# Patient Record
Sex: Male | Born: 1950
Health system: Southern US, Community
[De-identification: ages and names within clinical notes are randomized; demographics above are authoritative.]

## PROBLEM LIST (undated history)

## (undated) DIAGNOSIS — Z7901 Long term (current) use of anticoagulants: Secondary | ICD-10-CM

## (undated) DIAGNOSIS — R001 Bradycardia, unspecified: Secondary | ICD-10-CM

## (undated) DIAGNOSIS — M1711 Unilateral primary osteoarthritis, right knee: Secondary | ICD-10-CM

## (undated) DIAGNOSIS — I10 Essential (primary) hypertension: Secondary | ICD-10-CM

## (undated) DIAGNOSIS — E7800 Pure hypercholesterolemia, unspecified: Secondary | ICD-10-CM

## (undated) DIAGNOSIS — T753XXA Motion sickness, initial encounter: Secondary | ICD-10-CM

## (undated) DIAGNOSIS — M2140 Flat foot [pes planus] (acquired), unspecified foot: Secondary | ICD-10-CM

## (undated) DIAGNOSIS — E119 Type 2 diabetes mellitus without complications: Secondary | ICD-10-CM

## (undated) DIAGNOSIS — M109 Gout, unspecified: Secondary | ICD-10-CM

## (undated) DIAGNOSIS — N400 Enlarged prostate without lower urinary tract symptoms: Secondary | ICD-10-CM

## (undated) DIAGNOSIS — Z8249 Family history of ischemic heart disease and other diseases of the circulatory system: Secondary | ICD-10-CM

## (undated) DIAGNOSIS — I2510 Atherosclerotic heart disease of native coronary artery without angina pectoris: Secondary | ICD-10-CM

## (undated) DIAGNOSIS — I5189 Other ill-defined heart diseases: Secondary | ICD-10-CM

## (undated) DIAGNOSIS — R011 Cardiac murmur, unspecified: Secondary | ICD-10-CM

## (undated) DIAGNOSIS — Z9841 Cataract extraction status, right eye: Secondary | ICD-10-CM

## (undated) DIAGNOSIS — M1710 Unilateral primary osteoarthritis, unspecified knee: Secondary | ICD-10-CM

## (undated) DIAGNOSIS — G4733 Obstructive sleep apnea (adult) (pediatric): Secondary | ICD-10-CM

## (undated) DIAGNOSIS — M159 Polyosteoarthritis, unspecified: Secondary | ICD-10-CM

## (undated) DIAGNOSIS — L609 Nail disorder, unspecified: Secondary | ICD-10-CM

## (undated) DIAGNOSIS — K219 Gastro-esophageal reflux disease without esophagitis: Secondary | ICD-10-CM

## (undated) DIAGNOSIS — L989 Disorder of the skin and subcutaneous tissue, unspecified: Secondary | ICD-10-CM

## (undated) DIAGNOSIS — I739 Peripheral vascular disease, unspecified: Secondary | ICD-10-CM

## (undated) DIAGNOSIS — M1990 Unspecified osteoarthritis, unspecified site: Secondary | ICD-10-CM

## (undated) DIAGNOSIS — Z125 Encounter for screening for malignant neoplasm of prostate: Secondary | ICD-10-CM

## (undated) DIAGNOSIS — H9190 Unspecified hearing loss, unspecified ear: Secondary | ICD-10-CM

## (undated) DIAGNOSIS — K819 Cholecystitis, unspecified: Secondary | ICD-10-CM

## (undated) DIAGNOSIS — Z7982 Long term (current) use of aspirin: Secondary | ICD-10-CM

## (undated) DIAGNOSIS — K4020 Bilateral inguinal hernia, without obstruction or gangrene, not specified as recurrent: Secondary | ICD-10-CM

## (undated) DIAGNOSIS — E785 Hyperlipidemia, unspecified: Secondary | ICD-10-CM

## (undated) HISTORY — DX: Family history of ischemic heart disease and other diseases of the circulatory system: Z82.49

## (undated) HISTORY — DX: Flat foot (pes planus) (acquired), unspecified foot: M21.40

## (undated) HISTORY — PX: KNEE DEBRIDEMENT: SHX1894

## (undated) HISTORY — DX: Cholecystitis, unspecified: K81.9

## (undated) HISTORY — DX: Encounter for screening for malignant neoplasm of prostate: Z12.5

## (undated) HISTORY — DX: Essential (primary) hypertension: I10

## (undated) HISTORY — DX: Gout, unspecified: M10.9

## (undated) HISTORY — DX: Pure hypercholesterolemia, unspecified: E78.00

## (undated) HISTORY — PX: CARDIAC CATHETERIZATION: SHX172

## (undated) HISTORY — DX: Unilateral primary osteoarthritis, unspecified knee: M17.10

## (undated) HISTORY — DX: Gastro-esophageal reflux disease without esophagitis: K21.9

## (undated) HISTORY — DX: Disorder of the skin and subcutaneous tissue, unspecified: L98.9

## (undated) HISTORY — DX: Polyosteoarthritis, unspecified: M15.9

## (undated) HISTORY — DX: Nail disorder, unspecified: L60.9

## (undated) HISTORY — DX: Type 2 diabetes mellitus without complications: E11.9

## (undated) HISTORY — DX: Unilateral primary osteoarthritis, right knee: M17.11

## (undated) HISTORY — PX: BACK SURGERY: SHX140

## (undated) HISTORY — PX: TRANSURETHRAL RESECTION OF PROSTATE: SHX73

---

## 1970-11-30 HISTORY — PX: TONSILLECTOMY: SUR1361

## 1985-08-07 HISTORY — PX: LEFT HEART CATH AND CORONARY ANGIOGRAPHY: CATH118249

## 2002-12-13 DIAGNOSIS — E119 Type 2 diabetes mellitus without complications: Secondary | ICD-10-CM | POA: Insufficient documentation

## 2002-12-13 DIAGNOSIS — E7800 Pure hypercholesterolemia, unspecified: Secondary | ICD-10-CM | POA: Insufficient documentation

## 2002-12-13 DIAGNOSIS — E785 Hyperlipidemia, unspecified: Secondary | ICD-10-CM | POA: Insufficient documentation

## 2002-12-13 HISTORY — DX: Type 2 diabetes mellitus without complications: E11.9

## 2002-12-13 HISTORY — DX: Pure hypercholesterolemia, unspecified: E78.00

## 2009-11-30 HISTORY — PX: HERNIA REPAIR: SHX51

## 2010-10-17 DIAGNOSIS — I10 Essential (primary) hypertension: Secondary | ICD-10-CM | POA: Insufficient documentation

## 2010-10-17 HISTORY — DX: Essential (primary) hypertension: I10

## 2011-07-16 DIAGNOSIS — L609 Nail disorder, unspecified: Secondary | ICD-10-CM

## 2011-07-16 DIAGNOSIS — L03039 Cellulitis of unspecified toe: Secondary | ICD-10-CM | POA: Insufficient documentation

## 2011-07-16 HISTORY — DX: Nail disorder, unspecified: L60.9

## 2011-09-30 DIAGNOSIS — M2550 Pain in unspecified joint: Secondary | ICD-10-CM | POA: Insufficient documentation

## 2013-08-11 DIAGNOSIS — K219 Gastro-esophageal reflux disease without esophagitis: Secondary | ICD-10-CM

## 2013-08-11 DIAGNOSIS — M159 Polyosteoarthritis, unspecified: Secondary | ICD-10-CM

## 2013-08-11 HISTORY — DX: Polyosteoarthritis, unspecified: M15.9

## 2013-08-11 HISTORY — DX: Gastro-esophageal reflux disease without esophagitis: K21.9

## 2014-03-26 DIAGNOSIS — M109 Gout, unspecified: Secondary | ICD-10-CM

## 2014-03-26 DIAGNOSIS — Z8249 Family history of ischemic heart disease and other diseases of the circulatory system: Secondary | ICD-10-CM | POA: Insufficient documentation

## 2014-03-26 HISTORY — DX: Gout, unspecified: M10.9

## 2014-03-26 HISTORY — DX: Family history of ischemic heart disease and other diseases of the circulatory system: Z82.49

## 2014-10-29 DIAGNOSIS — Z125 Encounter for screening for malignant neoplasm of prostate: Secondary | ICD-10-CM

## 2014-10-29 DIAGNOSIS — R202 Paresthesia of skin: Secondary | ICD-10-CM | POA: Insufficient documentation

## 2014-10-29 HISTORY — DX: Encounter for screening for malignant neoplasm of prostate: Z12.5

## 2015-02-15 ENCOUNTER — Emergency Department: Payer: Self-pay | Admitting: Emergency Medicine

## 2015-03-11 DIAGNOSIS — L989 Disorder of the skin and subcutaneous tissue, unspecified: Secondary | ICD-10-CM

## 2015-03-11 HISTORY — DX: Disorder of the skin and subcutaneous tissue, unspecified: L98.9

## 2015-03-27 ENCOUNTER — Other Ambulatory Visit: Payer: Self-pay | Admitting: Orthopedic Surgery

## 2015-04-05 ENCOUNTER — Encounter (HOSPITAL_COMMUNITY)
Admission: RE | Admit: 2015-04-05 | Discharge: 2015-04-05 | Disposition: A | Discharge status: Home / Self Care | Payer: BC Managed Care – PPO | Source: Ambulatory Visit | Attending: Orthopedic Surgery | Admitting: Orthopedic Surgery

## 2015-04-05 ENCOUNTER — Encounter (HOSPITAL_COMMUNITY): Payer: Self-pay

## 2015-04-05 DIAGNOSIS — Z01810 Encounter for preprocedural cardiovascular examination: Secondary | ICD-10-CM | POA: Insufficient documentation

## 2015-04-05 DIAGNOSIS — E119 Type 2 diabetes mellitus without complications: Secondary | ICD-10-CM | POA: Insufficient documentation

## 2015-04-05 DIAGNOSIS — I10 Essential (primary) hypertension: Secondary | ICD-10-CM | POA: Diagnosis not present

## 2015-04-05 DIAGNOSIS — Z01812 Encounter for preprocedural laboratory examination: Secondary | ICD-10-CM | POA: Diagnosis not present

## 2015-04-05 DIAGNOSIS — Z01818 Encounter for other preprocedural examination: Secondary | ICD-10-CM | POA: Insufficient documentation

## 2015-04-05 HISTORY — DX: Essential (primary) hypertension: I10

## 2015-04-05 HISTORY — DX: Type 2 diabetes mellitus without complications: E11.9

## 2015-04-05 HISTORY — DX: Unspecified osteoarthritis, unspecified site: M19.90

## 2015-04-05 HISTORY — DX: Gastro-esophageal reflux disease without esophagitis: K21.9

## 2015-04-05 LAB — BASIC METABOLIC PANEL
Anion gap: 10 (ref 5–15)
BUN: 14 mg/dL (ref 6–20)
CO2: 25 mmol/L (ref 22–32)
Calcium: 9.4 mg/dL (ref 8.9–10.3)
Chloride: 104 mmol/L (ref 101–111)
Creatinine, Ser: 1.01 mg/dL (ref 0.61–1.24)
Glucose, Bld: 116 mg/dL — ABNORMAL HIGH (ref 70–99)
Potassium: 4.2 mmol/L (ref 3.5–5.1)
Sodium: 139 mmol/L (ref 135–145)

## 2015-04-05 LAB — TYPE AND SCREEN
ABO/RH(D): A POS
ANTIBODY SCREEN: NEGATIVE

## 2015-04-05 LAB — CBC WITH DIFFERENTIAL/PLATELET
BASOS ABS: 0.1 10*3/uL (ref 0.0–0.1)
BASOS PCT: 1 % (ref 0–1)
EOS PCT: 3 % (ref 0–5)
Eosinophils Absolute: 0.2 10*3/uL (ref 0.0–0.7)
HCT: 44 % (ref 39.0–52.0)
Hemoglobin: 14.8 g/dL (ref 13.0–17.0)
Lymphocytes Relative: 25 % (ref 12–46)
Lymphs Abs: 1.5 10*3/uL (ref 0.7–4.0)
MCH: 28.2 pg (ref 26.0–34.0)
MCHC: 33.6 g/dL (ref 30.0–36.0)
MCV: 84 fL (ref 78.0–100.0)
MONO ABS: 0.6 10*3/uL (ref 0.1–1.0)
Monocytes Relative: 11 % (ref 3–12)
Neutro Abs: 3.5 10*3/uL (ref 1.7–7.7)
Neutrophils Relative %: 60 % (ref 43–77)
PLATELETS: 220 10*3/uL (ref 150–400)
RBC: 5.24 MIL/uL (ref 4.22–5.81)
RDW: 13.1 % (ref 11.5–15.5)
WBC: 5.8 10*3/uL (ref 4.0–10.5)

## 2015-04-05 LAB — URINALYSIS, ROUTINE W REFLEX MICROSCOPIC
BILIRUBIN URINE: NEGATIVE
Glucose, UA: NEGATIVE mg/dL
HGB URINE DIPSTICK: NEGATIVE
Ketones, ur: NEGATIVE mg/dL
Leukocytes, UA: NEGATIVE
Nitrite: NEGATIVE
Protein, ur: NEGATIVE mg/dL
SPECIFIC GRAVITY, URINE: 1.011 (ref 1.005–1.030)
UROBILINOGEN UA: 0.2 mg/dL (ref 0.0–1.0)
pH: 5 (ref 5.0–8.0)

## 2015-04-05 LAB — SURGICAL PCR SCREEN
MRSA, PCR: NEGATIVE
Staphylococcus aureus: NEGATIVE

## 2015-04-05 LAB — PROTIME-INR
INR: 0.96 (ref 0.00–1.49)
PROTHROMBIN TIME: 12.8 s (ref 11.6–15.2)

## 2015-04-05 LAB — APTT: APTT: 37 s (ref 24–37)

## 2015-04-05 LAB — ABO/RH: ABO/RH(D): A POS

## 2015-04-05 NOTE — Pre-Procedure Instructions (Signed)
Marylee FlorasClyde D Harville  04/05/2015   Your procedure is scheduled on:  04/15/15  [facility] at 1030 AM.  Call this number if you have problems the morning of surgery: [phone]   Remember:   Do not eat food or drink liquids after midnight.   Take these medicines the morning of surgery with A SIP OF WATER: clonidine,labelatol, prilosec, uloric     STOP all herbel meds, nsaids (aleve,naproxen,advil,ibuprofen) 5 days prior to surgery starting 04/10/15 aspirin, krill oil, vitamins,   NO metformin day of surgery   Do not wear jewelry, make-up or nail polish.  Do not wear lotions, powders, or perfumes. You may wear deodorant.  Do not shave 48 hours prior to surgery. Men may shave face and neck.  Do not bring valuables to the hospital.  [facility] is not responsible                  for any belongings or valuables.               Contacts, dentures or bridgework may not be worn into surgery.  Leave suitcase in the car. After surgery it may be brought to your room.  For patients admitted to the hospital, discharge time is determined by your                treatment team.               Patients discharged the day of surgery will not be allowed to drive  home.  Name and phone number of your driver:   Special Instructions:  Special Instructions: [facility] - Preparing for Surgery  Before surgery, you can play an important role.  Because skin is not sterile, your skin needs to be as free of germs as possible.  You can reduce the number of germs on you skin by washing with CHG (chlorahexidine gluconate) soap before surgery.  CHG is an antiseptic cleaner which kills germs and bonds with the skin to continue killing germs even after washing.  Please DO NOT use if you have an allergy to CHG or antibacterial soaps.  If your skin becomes reddened/irritated stop using the CHG and inform your nurse when you arrive at Short Stay.  Do not shave (including legs and underarms) for at  least 48 hours prior to the first CHG shower.  You may shave your face.  Please follow these instructions carefully:   1.  Shower with CHG Soap the night before surgery and the morning of Surgery.  2.  If you choose to wash your hair, wash your hair first as usual with your normal shampoo.  3.  After you shampoo, rinse your hair and body thoroughly to remove the Shampoo.  4.  Use CHG as you would any other liquid soap.  You can apply chg directly  to the skin and wash gently with scrungie or a clean washcloth.  5.  Apply the CHG Soap to your body ONLY FROM THE NECK DOWN.  Do not use on open wounds or open sores.  Avoid contact with your eyes ears, mouth and genitals (private parts).  Wash genitals (private parts)       with your normal soap.  6.  Wash thoroughly, paying special attention to the area where your surgery will be performed.  7.  Thoroughly rinse your body with warm water from the neck down.  8.  DO NOT shower/wash with your normal soap after  using and rinsing off the CHG Soap.  9.  Pat yourself dry with a clean towel.            10.  Wear clean pajamas.            11.  Place clean sheets on your bed the night of your first shower and do not sleep with pets.  Day of Surgery  Do not apply any lotions/deodorants the morning of surgery.  Please wear clean clothes to the hospital/surgery center.   Please read over the following fact sheets that you were given: Pain Booklet, Coughing and Deep Breathing, Blood Transfusion Information, Total Joint Packet, MRSA Information and Surgical Site Infection Prevention

## 2015-04-05 NOTE — Progress Notes (Signed)
   04/05/15 0920  OBSTRUCTIVE SLEEP APNEA  Have you ever been diagnosed with sleep apnea through a sleep study? No  Do you snore loudly (loud enough to be heard through closed doors)?  0  Do you often feel tired, fatigued, or sleepy during the daytime? 0  Has anyone observed you stop breathing during your sleep? 0  Do you have, or are you being treated for high blood pressure? 1  BMI more than 35 kg/m2? 1  Age over 64 years old? 1  Neck circumference greater than 40 cm/16 inches? 1 (19)  Gender: 1  Obstructive Sleep Apnea Score 5

## 2015-04-08 NOTE — Progress Notes (Addendum)
Anesthesia Chart Review:  Pt is 64 year old male scheduled for R total knee arthroplasty on 04/15/2015 with Dr. Turner Danielsowan.   PCP is [facility]James [facility], last office visit 03/11/2015.   PMH includes: HTN, DM, GERD. Former smoker. BMI 36.5.   Medications include: ASA, clonidine, labetalol, lisinopril, metformin.   Preoperative labs reviewed.    Chest x-ray reviewed. No edema or consolidation.   EKG: Sinus rhythm with 1st degree A-V block  Pt's last office visit note with PCP 03/11/2015 indicates PCP wanted to see pt for pre-op evaluation exam. Notified Olegario MessierKathy in Dr. Wadie Lessenowan's office.   If no changes, I anticipate pt can proceed with surgery as scheduled.   Rica Mastngela Kabbe, FNP-BC [facility]/Anesthesiology Phone: [phone] 04/08/2015 4:02 PM  Addendum: I received a phone call from Tacey RuizEmily Dolleschel, NP with [facility] yesterday.  Patient had inquired if he would need to be seen further prior to surgery. She was wanting to confirm patient's EKG, CXR, pre-operative labs results were unremarkable.  I verbally reviewed results and later faxed them to her with confirmation.  I informed her that results here were felt acceptable for the OR.   Velna Ochsllison Trevyn Lumpkin, PA-C [facility]/Anesthesiology Phone [phone] 04/12/2015 6:40 PM

## 2015-04-12 NOTE — H&P (Signed)
TOTAL KNEE ADMISSION H&P  Patient is being admitted for right total knee arthroplasty.  Subjective:  Chief Complaint:right knee pain.  HPI: Juan Watson, 64 y.o. male, has a history of pain and functional disability in the right knee due to arthritis and has failed non-surgical conservative treatments for greater than 12 weeks to includeNSAID's and/or analgesics, corticosteriod injections, viscosupplementation injections, use of assistive devices, weight reduction as appropriate and activity modification.  Onset of symptoms was gradual, starting 8 years ago with gradually worsening course since that time. The patient noted no past surgery on the right knee(s).  Patient currently rates pain in the right knee(s) at 10 out of 10 with activity. Patient has night pain, worsening of pain with activity and weight bearing, pain that interferes with activities of daily living, pain with passive range of motion and joint swelling.  Patient has evidence of joint space narrowing by imaging studies. There is no active infection.  There are no active problems to display for this patient.  Past Medical History  Diagnosis Date  . Hypertension   . Diabetes mellitus without complication   . GERD (gastroesophageal reflux disease)   . Arthritis     Past Surgical History  Procedure Laterality Date  . Tonsillectomy  72  . Back surgery  77,87  . Transurethral resection of prostate  96  . Hernia repair  11,    umbilical x2  . Knee debridement Left     tendon  . Cardiac catheterization  70's    pericarditis    No prescriptions prior to admission   Allergies  Allergen Reactions  . Bee Venom Swelling    Sob, swelling  . Sulfa Antibiotics Swelling    Tongue swelling    History  Substance Use Topics  . Smoking status: Former Smoker -- 2.00 packs/day for 25 years    Types: Cigarettes    Quit date: 04/04/1992  . Smokeless tobacco: Not on file  . Alcohol Use: No    No family history on file.    Review of Systems  Constitutional: Negative.   HENT: Positive for hearing loss.   Eyes: Negative.   Respiratory: Negative.   Cardiovascular: Negative.   Gastrointestinal: Negative.   Genitourinary: Negative.   Musculoskeletal: Positive for joint pain.  Skin: Negative.   Neurological: Negative.   Endo/Heme/Allergies: Negative.   Psychiatric/Behavioral: Negative.     Objective:  Physical Exam  Constitutional: He is oriented to person, place, and time. He appears well-developed and well-nourished.  HENT:  Head: Normocephalic and atraumatic.  Eyes: Pupils are equal, round, and reactive to light.  Neck: Normal range of motion. Neck supple.  Cardiovascular: Intact distal pulses.   Respiratory: Effort normal.  Musculoskeletal: He exhibits tenderness.  the patient has a range from approximately 2-3 to 95-100.  No instability.  Patient does have mild medial joint line tenderness.  Increased pain over the superior portion of the patellofemoral joint.  Obvious crepitus with range of motion.  Patient's left knee does have a large scar from his previous quadriceps tendon repair and also a chain saw injury.  Patient does have a mild to moderate effusion.  He does have a calcific mass over the superior lateral portion of the joint capsule.  Patient's calves are soft and nontender.  He is neurovascularly intact distally.  Neurological: He is alert and oriented to person, place, and time.  Skin: Skin is warm and dry.  Psychiatric: He has a normal mood and affect. His behavior is normal.  Judgment and thought content normal.    Vital signs in last 24 hours:    Labs:   There is no height or weight on file to calculate BMI.   Imaging Review Plain radiographs demonstrate bilateral AP weightbearing, bilateral Rosenberg, lateral sunrise views of the right knee are taken and reviewed in office today.  Patient does have end-stage bone-on-bone arthritis of the medial compartment.  He has  moderate to severe arthritis of the patellofemoral compartment.  Patient does have a calcific mass over the superior lateral portion of the capsule.  Assessment/Plan:  End stage arthritis, right knee   The patient history, physical examination, clinical judgment of the provider and imaging studies are consistent with end stage degenerative joint disease of the right knee(s) and total knee arthroplasty is deemed medically necessary. The treatment options including medical management, injection therapy arthroscopy and arthroplasty were discussed at length. The risks and benefits of total knee arthroplasty were presented and reviewed. The risks due to aseptic loosening, infection, stiffness, patella tracking problems, thromboembolic complications and other imponderables were discussed. The patient acknowledged the explanation, agreed to proceed with the plan and consent was signed. Patient is being admitted for inpatient treatment for surgery, pain control, PT, OT, prophylactic antibiotics, VTE prophylaxis, progressive ambulation and ADL's and discharge planning. The patient is planning to be discharged home with home health services

## 2015-04-14 DIAGNOSIS — M1711 Unilateral primary osteoarthritis, right knee: Secondary | ICD-10-CM

## 2015-04-14 HISTORY — DX: Unilateral primary osteoarthritis, right knee: M17.11

## 2015-04-15 ENCOUNTER — Encounter (HOSPITAL_COMMUNITY): Payer: Self-pay | Admitting: Surgery

## 2015-04-15 ENCOUNTER — Inpatient Hospital Stay (HOSPITAL_COMMUNITY): Payer: BC Managed Care – PPO | Admitting: Anesthesiology

## 2015-04-15 ENCOUNTER — Inpatient Hospital Stay (HOSPITAL_COMMUNITY): Payer: BC Managed Care – PPO | Admitting: Vascular Surgery

## 2015-04-15 ENCOUNTER — Inpatient Hospital Stay (HOSPITAL_COMMUNITY)
Admission: RE | Admit: 2015-04-15 | Discharge: 2015-04-17 | DRG: 470 | Disposition: A | Discharge status: Home Health | Payer: BC Managed Care – PPO | Source: Ambulatory Visit | Attending: Orthopedic Surgery | Admitting: Orthopedic Surgery

## 2015-04-15 ENCOUNTER — Encounter (HOSPITAL_COMMUNITY)
Admission: RE | Disposition: A | Discharge status: Home Health | Payer: Self-pay | Source: Ambulatory Visit | Attending: Orthopedic Surgery

## 2015-04-15 DIAGNOSIS — M1711 Unilateral primary osteoarthritis, right knee: Secondary | ICD-10-CM | POA: Diagnosis present

## 2015-04-15 DIAGNOSIS — E119 Type 2 diabetes mellitus without complications: Secondary | ICD-10-CM | POA: Diagnosis present

## 2015-04-15 DIAGNOSIS — K219 Gastro-esophageal reflux disease without esophagitis: Secondary | ICD-10-CM | POA: Diagnosis present

## 2015-04-15 DIAGNOSIS — R110 Nausea: Secondary | ICD-10-CM | POA: Diagnosis not present

## 2015-04-15 DIAGNOSIS — D62 Acute posthemorrhagic anemia: Secondary | ICD-10-CM | POA: Diagnosis not present

## 2015-04-15 DIAGNOSIS — Z87891 Personal history of nicotine dependence: Secondary | ICD-10-CM | POA: Diagnosis not present

## 2015-04-15 DIAGNOSIS — M1710 Unilateral primary osteoarthritis, unspecified knee: Secondary | ICD-10-CM | POA: Insufficient documentation

## 2015-04-15 DIAGNOSIS — M25561 Pain in right knee: Secondary | ICD-10-CM | POA: Diagnosis present

## 2015-04-15 DIAGNOSIS — M179 Osteoarthritis of knee, unspecified: Secondary | ICD-10-CM

## 2015-04-15 DIAGNOSIS — I10 Essential (primary) hypertension: Secondary | ICD-10-CM | POA: Diagnosis present

## 2015-04-15 HISTORY — DX: Osteoarthritis of knee, unspecified: M17.9

## 2015-04-15 HISTORY — PX: TOTAL KNEE ARTHROPLASTY: SHX125

## 2015-04-15 HISTORY — DX: Unilateral primary osteoarthritis, unspecified knee: M17.10

## 2015-04-15 LAB — GLUCOSE, CAPILLARY
GLUCOSE-CAPILLARY: 129 mg/dL — AB (ref 65–99)
GLUCOSE-CAPILLARY: 107 mg/dL — AB (ref 65–99)
GLUCOSE-CAPILLARY: 102 mg/dL — AB (ref 65–99)

## 2015-04-15 SURGERY — ARTHROPLASTY, KNEE, TOTAL
Anesthesia: Spinal | Site: Knee | Laterality: Right

## 2015-04-15 MED ORDER — HYDROMORPHONE HCL 1 MG/ML IJ SOLN
1.0000 mg | INTRAMUSCULAR | Status: DC | PRN
  Administered 2015-04-15 – 2015-04-17 (×6): 1 mg via INTRAVENOUS
  Filled 2015-04-15 (×6): qty 1

## 2015-04-15 MED ORDER — METHOCARBAMOL 500 MG PO TABS: 500.0000 mg | ORAL_TABLET | Freq: Two times a day (BID) | ORAL | Status: DC

## 2015-04-15 MED ORDER — LACTATED RINGERS IV SOLN
INTRAVENOUS | Status: DC
  Administered 2015-04-15: 10:00:00 via INTRAVENOUS

## 2015-04-15 MED ORDER — OXYCODONE HCL 5 MG PO TABS: 5.0000 mg | ORAL_TABLET | Freq: Once | ORAL | Status: DC | PRN

## 2015-04-15 MED ORDER — CHLORHEXIDINE GLUCONATE 4 % EX LIQD
60.0000 mL | Freq: Once | CUTANEOUS | Status: DC
  Filled 2015-04-15: qty 60

## 2015-04-15 MED ORDER — LOSARTAN POTASSIUM 50 MG PO TABS
100.0000 mg | ORAL_TABLET | Freq: Every day | ORAL | Status: DC
  Administered 2015-04-15 – 2015-04-17 (×3): 100 mg via ORAL
  Filled 2015-04-15 (×3): qty 2

## 2015-04-15 MED ORDER — OXYCODONE-ACETAMINOPHEN 5-325 MG PO TABS: 1.0000 | ORAL_TABLET | ORAL | Status: DC | PRN

## 2015-04-15 MED ORDER — FLEET ENEMA 7-19 GM/118ML RE ENEM: 1.0000 | ENEMA | Freq: Once | RECTAL | Status: AC | PRN

## 2015-04-15 MED ORDER — TRANEXAMIC ACID 1000 MG/10ML IV SOLN
1000.0000 mg | INTRAVENOUS | Status: AC
  Administered 2015-04-15: 1000 mg via INTRAVENOUS
  Filled 2015-04-15: qty 10

## 2015-04-15 MED ORDER — BISACODYL 5 MG PO TBEC: 5.0000 mg | DELAYED_RELEASE_TABLET | Freq: Every day | ORAL | Status: DC | PRN

## 2015-04-15 MED ORDER — LACTATED RINGERS IV SOLN
INTRAVENOUS | Status: DC | PRN
  Administered 2015-04-15 (×2): via INTRAVENOUS

## 2015-04-15 MED ORDER — PROPOFOL INFUSION 10 MG/ML OPTIME
INTRAVENOUS | Status: DC | PRN
  Administered 2015-04-15: 40 ug/kg/min via INTRAVENOUS

## 2015-04-15 MED ORDER — MENTHOL 3 MG MT LOZG: 1.0000 | LOZENGE | OROMUCOSAL | Status: DC | PRN

## 2015-04-15 MED ORDER — BUPIVACAINE HCL (PF) 0.75 % IJ SOLN
INTRAMUSCULAR | Status: DC | PRN
  Administered 2015-04-15: 1.6 mL via INTRATHECAL

## 2015-04-15 MED ORDER — FENTANYL CITRATE (PF) 100 MCG/2ML IJ SOLN
INTRAMUSCULAR | Status: AC
  Filled 2015-04-15: qty 2

## 2015-04-15 MED ORDER — METOCLOPRAMIDE HCL 5 MG/ML IJ SOLN
5.0000 mg | Freq: Three times a day (TID) | INTRAMUSCULAR | Status: DC | PRN
  Administered 2015-04-16: 10 mg via INTRAVENOUS
  Filled 2015-04-15: qty 2

## 2015-04-15 MED ORDER — ACETAMINOPHEN 325 MG PO TABS: 650.0000 mg | ORAL_TABLET | Freq: Four times a day (QID) | ORAL | Status: DC | PRN

## 2015-04-15 MED ORDER — INSULIN ASPART 100 UNIT/ML ~~LOC~~ SOLN
0.0000 [IU] | Freq: Three times a day (TID) | SUBCUTANEOUS | Status: DC
  Administered 2015-04-16: 3 [IU] via SUBCUTANEOUS
  Administered 2015-04-16 – 2015-04-17 (×5): 2 [IU] via SUBCUTANEOUS

## 2015-04-15 MED ORDER — ONDANSETRON HCL 4 MG PO TABS: 4.0000 mg | ORAL_TABLET | Freq: Four times a day (QID) | ORAL | Status: DC | PRN

## 2015-04-15 MED ORDER — MIDAZOLAM HCL 5 MG/5ML IJ SOLN
INTRAMUSCULAR | Status: DC | PRN
  Administered 2015-04-15: 2 mg via INTRAVENOUS

## 2015-04-15 MED ORDER — GEMFIBROZIL 600 MG PO TABS
600.0000 mg | ORAL_TABLET | Freq: Two times a day (BID) | ORAL | Status: DC
  Administered 2015-04-15 – 2015-04-17 (×4): 600 mg via ORAL
  Filled 2015-04-15 (×5): qty 1

## 2015-04-15 MED ORDER — DOCUSATE SODIUM 100 MG PO CAPS
100.0000 mg | ORAL_CAPSULE | Freq: Two times a day (BID) | ORAL | Status: DC
  Administered 2015-04-15 – 2015-04-17 (×4): 100 mg via ORAL
  Filled 2015-04-15 (×4): qty 1

## 2015-04-15 MED ORDER — LISINOPRIL 40 MG PO TABS: 40.0000 mg | ORAL_TABLET | Freq: Every day | ORAL | Status: DC

## 2015-04-15 MED ORDER — DIPHENHYDRAMINE HCL 12.5 MG/5ML PO ELIX: 12.5000 mg | ORAL_SOLUTION | ORAL | Status: DC | PRN

## 2015-04-15 MED ORDER — BUPIVACAINE LIPOSOME 1.3 % IJ SUSP
20.0000 mL | Freq: Once | INTRAMUSCULAR | Status: DC
  Filled 2015-04-15: qty 20

## 2015-04-15 MED ORDER — BUPIVACAINE LIPOSOME 1.3 % IJ SUSP
INTRAMUSCULAR | Status: DC | PRN
  Administered 2015-04-15: 20 mL

## 2015-04-15 MED ORDER — METOCLOPRAMIDE HCL 5 MG PO TABS: 5.0000 mg | ORAL_TABLET | Freq: Three times a day (TID) | ORAL | Status: DC | PRN

## 2015-04-15 MED ORDER — PHENOL 1.4 % MT LIQD: 1.0000 | OROMUCOSAL | Status: DC | PRN

## 2015-04-15 MED ORDER — FENTANYL CITRATE (PF) 100 MCG/2ML IJ SOLN
25.0000 ug | INTRAMUSCULAR | Status: DC | PRN
  Administered 2015-04-15 (×3): 50 ug via INTRAVENOUS

## 2015-04-15 MED ORDER — FENTANYL CITRATE (PF) 100 MCG/2ML IJ SOLN
25.0000 ug | INTRAMUSCULAR | Status: DC | PRN
  Administered 2015-04-15 (×2): 50 ug via INTRAVENOUS

## 2015-04-15 MED ORDER — ALUM & MAG HYDROXIDE-SIMETH 200-200-20 MG/5ML PO SUSP: 30.0000 mL | ORAL | Status: DC | PRN

## 2015-04-15 MED ORDER — METFORMIN HCL 500 MG PO TABS
250.0000 mg | ORAL_TABLET | Freq: Two times a day (BID) | ORAL | Status: DC
  Administered 2015-04-16 – 2015-04-17 (×4): 250 mg via ORAL
  Filled 2015-04-15 (×4): qty 1

## 2015-04-15 MED ORDER — OXYCODONE HCL 5 MG PO TABS
5.0000 mg | ORAL_TABLET | ORAL | Status: DC | PRN
  Administered 2015-04-15 – 2015-04-17 (×14): 10 mg via ORAL
  Filled 2015-04-15 (×14): qty 2

## 2015-04-15 MED ORDER — ASPIRIN EC 325 MG PO TBEC: 325.0000 mg | DELAYED_RELEASE_TABLET | Freq: Two times a day (BID) | ORAL | Status: DC

## 2015-04-15 MED ORDER — CEFAZOLIN SODIUM-DEXTROSE 2-3 GM-% IV SOLR
INTRAVENOUS | Status: AC
  Filled 2015-04-15: qty 50

## 2015-04-15 MED ORDER — SODIUM CHLORIDE 0.9 % IR SOLN
Status: DC | PRN
  Administered 2015-04-15: 1000 mL

## 2015-04-15 MED ORDER — HYDROMORPHONE HCL 1 MG/ML IJ SOLN
1.0000 mg | Freq: Once | INTRAMUSCULAR | Status: AC
  Administered 2015-04-15: 1 mg via INTRAVENOUS

## 2015-04-15 MED ORDER — MIDAZOLAM HCL 2 MG/2ML IJ SOLN
1.0000 mg | Freq: Once | INTRAMUSCULAR | Status: AC
  Administered 2015-04-15: 1 mg via INTRAVENOUS

## 2015-04-15 MED ORDER — METHOCARBAMOL 500 MG PO TABS
500.0000 mg | ORAL_TABLET | Freq: Four times a day (QID) | ORAL | Status: DC | PRN
  Administered 2015-04-15 – 2015-04-17 (×8): 500 mg via ORAL
  Filled 2015-04-15 (×8): qty 1

## 2015-04-15 MED ORDER — CLONIDINE HCL 0.1 MG PO TABS
0.1000 mg | ORAL_TABLET | Freq: Every day | ORAL | Status: DC
  Administered 2015-04-16 – 2015-04-17 (×2): 0.1 mg via ORAL
  Filled 2015-04-15 (×2): qty 1

## 2015-04-15 MED ORDER — CEFAZOLIN SODIUM-DEXTROSE 2-3 GM-% IV SOLR
2.0000 g | INTRAVENOUS | Status: AC
  Administered 2015-04-15: 2 g via INTRAVENOUS

## 2015-04-15 MED ORDER — ACETAMINOPHEN 650 MG RE SUPP
650.0000 mg | Freq: Four times a day (QID) | RECTAL | Status: DC | PRN
Start: 2015-04-15 — End: 2015-04-17

## 2015-04-15 MED ORDER — PANTOPRAZOLE SODIUM 40 MG PO TBEC
40.0000 mg | DELAYED_RELEASE_TABLET | Freq: Every day | ORAL | Status: DC
Start: 2015-04-16 — End: 2015-04-17
  Administered 2015-04-16 – 2015-04-17 (×2): 40 mg via ORAL
  Filled 2015-04-15 (×3): qty 1

## 2015-04-15 MED ORDER — FENTANYL CITRATE (PF) 100 MCG/2ML IJ SOLN
INTRAMUSCULAR | Status: DC | PRN
  Administered 2015-04-15: 25 ug via INTRAVENOUS
  Administered 2015-04-15: 50 ug via INTRAVENOUS
  Administered 2015-04-15: 25 ug via INTRAVENOUS

## 2015-04-15 MED ORDER — SENNOSIDES-DOCUSATE SODIUM 8.6-50 MG PO TABS: 1.0000 | ORAL_TABLET | Freq: Every evening | ORAL | Status: DC | PRN

## 2015-04-15 MED ORDER — ONDANSETRON HCL 4 MG/2ML IJ SOLN
4.0000 mg | Freq: Four times a day (QID) | INTRAMUSCULAR | Status: DC | PRN
Start: 2015-04-15 — End: 2015-04-17
  Administered 2015-04-15 – 2015-04-16 (×2): 4 mg via INTRAVENOUS
  Filled 2015-04-15 (×2): qty 2

## 2015-04-15 MED ORDER — ASPIRIN EC 325 MG PO TBEC
325.0000 mg | DELAYED_RELEASE_TABLET | Freq: Every day | ORAL | Status: DC
  Administered 2015-04-16 – 2015-04-17 (×2): 325 mg via ORAL
  Filled 2015-04-15 (×2): qty 1

## 2015-04-15 MED ORDER — SODIUM CHLORIDE 0.9 % IJ SOLN
INTRAMUSCULAR | Status: DC | PRN
  Administered 2015-04-15: 40 mL

## 2015-04-15 MED ORDER — OXYCODONE HCL 5 MG/5ML PO SOLN: 5.0000 mg | Freq: Once | ORAL | Status: DC | PRN

## 2015-04-15 MED ORDER — METHOCARBAMOL 1000 MG/10ML IJ SOLN
500.0000 mg | Freq: Four times a day (QID) | INTRAVENOUS | Status: DC | PRN
  Filled 2015-04-15: qty 5

## 2015-04-15 MED ORDER — KCL IN DEXTROSE-NACL 20-5-0.45 MEQ/L-%-% IV SOLN
INTRAVENOUS | Status: DC
Start: 2015-04-15 — End: 2015-04-17
  Administered 2015-04-15 – 2015-04-16 (×2): via INTRAVENOUS
  Filled 2015-04-15 (×8): qty 1000

## 2015-04-15 MED ORDER — LABETALOL HCL 200 MG PO TABS
200.0000 mg | ORAL_TABLET | Freq: Two times a day (BID) | ORAL | Status: DC
  Administered 2015-04-15 – 2015-04-17 (×5): 200 mg via ORAL
  Filled 2015-04-15 (×6): qty 1

## 2015-04-15 MED ORDER — FEBUXOSTAT 40 MG PO TABS
40.0000 mg | ORAL_TABLET | Freq: Every day | ORAL | Status: DC
  Administered 2015-04-15 – 2015-04-17 (×3): 40 mg via ORAL
  Filled 2015-04-15 (×3): qty 1

## 2015-04-15 MED ORDER — ONDANSETRON HCL 4 MG/2ML IJ SOLN
4.0000 mg | Freq: Four times a day (QID) | INTRAMUSCULAR | Status: AC | PRN
  Administered 2015-04-15: 4 mg via INTRAVENOUS

## 2015-04-15 SURGICAL SUPPLY — 70 items
BANDAGE ELASTIC 6 VELCRO ST LF (GAUZE/BANDAGES/DRESSINGS) ×3 IMPLANT
BANDAGE ESMARK 6X9 LF (GAUZE/BANDAGES/DRESSINGS) ×1 IMPLANT
BLADE SAG 18X100X1.27 (BLADE) ×3 IMPLANT
BLADE SAW SGTL 13X75X1.27 (BLADE) ×3 IMPLANT
BLADE SURG ROTATE 9660 (MISCELLANEOUS) IMPLANT
BNDG ELASTIC 6X10 VLCR STRL LF (GAUZE/BANDAGES/DRESSINGS) ×3 IMPLANT
BNDG ESMARK 6X9 LF (GAUZE/BANDAGES/DRESSINGS) ×3
BOWL SMART MIX CTS (DISPOSABLE) ×3 IMPLANT
CAPT KNEE TOTAL 3 ATTUNE ×3 IMPLANT
CEMENT HV SMART SET (Cement) ×6 IMPLANT
COVER SURGICAL LIGHT HANDLE (MISCELLANEOUS) ×3 IMPLANT
CUFF TOURNIQUET SINGLE 34IN LL (TOURNIQUET CUFF) ×3 IMPLANT
CUFF TOURNIQUET SINGLE 44IN (TOURNIQUET CUFF) IMPLANT
DRAPE EXTREMITY T 121X128X90 (DRAPE) ×3 IMPLANT
DRAPE IMP U-DRAPE 54X76 (DRAPES) ×3 IMPLANT
DRAPE U-SHAPE 47X51 STRL (DRAPES) ×3 IMPLANT
DRSG PAD ABDOMINAL 8X10 ST (GAUZE/BANDAGES/DRESSINGS) ×3 IMPLANT
DURAPREP 26ML APPLICATOR (WOUND CARE) ×6 IMPLANT
ELECT REM PT RETURN 9FT ADLT (ELECTROSURGICAL) ×3
ELECTRODE REM PT RTRN 9FT ADLT (ELECTROSURGICAL) ×1 IMPLANT
EVACUATOR 1/8 PVC DRAIN (DRAIN) IMPLANT
GAUZE SPONGE 4X4 12PLY STRL (GAUZE/BANDAGES/DRESSINGS) ×6 IMPLANT
GAUZE XEROFORM 1X8 LF (GAUZE/BANDAGES/DRESSINGS) ×3 IMPLANT
GLOVE BIO SURGEON STRL SZ7.5 (GLOVE) ×6 IMPLANT
GLOVE BIO SURGEON STRL SZ8.5 (GLOVE) ×6 IMPLANT
GLOVE BIOGEL PI IND STRL 7.0 (GLOVE) ×1 IMPLANT
GLOVE BIOGEL PI IND STRL 7.5 (GLOVE) ×1 IMPLANT
GLOVE BIOGEL PI IND STRL 8 (GLOVE) ×2 IMPLANT
GLOVE BIOGEL PI IND STRL 9 (GLOVE) ×1 IMPLANT
GLOVE BIOGEL PI INDICATOR 7.0 (GLOVE) ×2
GLOVE BIOGEL PI INDICATOR 7.5 (GLOVE) ×2
GLOVE BIOGEL PI INDICATOR 8 (GLOVE) ×4
GLOVE BIOGEL PI INDICATOR 9 (GLOVE) ×2
GLOVE SS BIOGEL STRL SZ 7.5 (GLOVE) ×2 IMPLANT
GLOVE SUPERSENSE BIOGEL SZ 7.5 (GLOVE) ×4
GOWN STRL REUS W/ TWL LRG LVL3 (GOWN DISPOSABLE) ×1 IMPLANT
GOWN STRL REUS W/ TWL XL LVL3 (GOWN DISPOSABLE) ×2 IMPLANT
GOWN STRL REUS W/TWL LRG LVL3 (GOWN DISPOSABLE) ×2
GOWN STRL REUS W/TWL XL LVL3 (GOWN DISPOSABLE) ×4
HANDPIECE INTERPULSE COAX TIP (DISPOSABLE) ×2
HOOD PEEL AWAY FACE SHEILD DIS (HOOD) ×6 IMPLANT
KIT BASIN OR (CUSTOM PROCEDURE TRAY) ×3 IMPLANT
KIT ROOM TURNOVER OR (KITS) ×3 IMPLANT
MANIFOLD NEPTUNE II (INSTRUMENTS) ×3 IMPLANT
NDL SAFETY ECLIPSE 18X1.5 (NEEDLE) IMPLANT
NEEDLE 22X1 1/2 (OR ONLY) (NEEDLE) ×3 IMPLANT
NEEDLE HYPO 18GX1.5 SHARP (NEEDLE)
NEEDLE SPNL 18GX3.5 QUINCKE PK (NEEDLE) IMPLANT
NS IRRIG 1000ML POUR BTL (IV SOLUTION) ×3 IMPLANT
PACK TOTAL JOINT (CUSTOM PROCEDURE TRAY) ×3 IMPLANT
PACK UNIVERSAL I (CUSTOM PROCEDURE TRAY) ×3 IMPLANT
PAD ARMBOARD 7.5X6 YLW CONV (MISCELLANEOUS) ×6 IMPLANT
PADDING CAST COTTON 6X4 STRL (CAST SUPPLIES) ×3 IMPLANT
SET HNDPC FAN SPRY TIP SCT (DISPOSABLE) ×1 IMPLANT
SPONGE GAUZE 4X4 12PLY STER LF (GAUZE/BANDAGES/DRESSINGS) ×3 IMPLANT
SUCTION FRAZIER TIP 10 FR DISP (SUCTIONS) ×3 IMPLANT
SUT VIC AB 0 CT1 27 (SUTURE) ×2
SUT VIC AB 0 CT1 27XBRD ANBCTR (SUTURE) ×1 IMPLANT
SUT VIC AB 1 CTX 36 (SUTURE) ×2
SUT VIC AB 1 CTX36XBRD ANBCTR (SUTURE) ×1 IMPLANT
SUT VIC AB 2-0 CT1 27 (SUTURE) ×2
SUT VIC AB 2-0 CT1 TAPERPNT 27 (SUTURE) ×1 IMPLANT
SUT VIC AB 3-0 CT1 27 (SUTURE) ×2
SUT VIC AB 3-0 CT1 TAPERPNT 27 (SUTURE) ×1 IMPLANT
SUT VIC AB 3-0 FS2 27 (SUTURE) ×3 IMPLANT
SYR 30ML LL (SYRINGE) ×3 IMPLANT
SYR 50ML LL SCALE MARK (SYRINGE) ×3 IMPLANT
TOWEL OR 17X24 6PK STRL BLUE (TOWEL DISPOSABLE) ×3 IMPLANT
TOWEL OR 17X26 10 PK STRL BLUE (TOWEL DISPOSABLE) ×3 IMPLANT
WATER STERILE IRR 1000ML POUR (IV SOLUTION) ×9 IMPLANT

## 2015-04-15 NOTE — Interval H&P Note (Signed)
History and Physical Interval Note:  04/15/2015 10:03 AM  Juan Watson  has presented today for surgery, with the diagnosis of RIGHT KNEE OSTEOARTHRITIS  The various methods of treatment have been discussed with the patient and family. After consideration of risks, benefits and other options for treatment, the patient has consented to  Procedure(s): TOTAL KNEE ARTHROPLASTY (Right) as a surgical intervention .  The patient's history has been reviewed, patient examined, no change in status, stable for surgery.  I have reviewed the patient's chart and labs.  Questions were answered to the patient's satisfaction.     Nestor LewandowskyOWAN,Juan Watson

## 2015-04-15 NOTE — Progress Notes (Signed)
CBG 132. 

## 2015-04-15 NOTE — Progress Notes (Signed)
Dr Noreene LarssonJoslin at bedside administered 40mcg bolus of Precedex. Patient comfortable and resting. BP 149/67. Will continue to monitor.

## 2015-04-15 NOTE — Transfer of Care (Signed)
Immediate Anesthesia Transfer of Care Note  Patient: Juan FlorasClyde D Dockendorf  Procedure(s) Performed: Procedure(s): TOTAL KNEE ARTHROPLASTY (Right)  Patient Location: PACU  Anesthesia Type:MAC and Spinal  Level of Consciousness: awake and alert   Airway & Oxygen Therapy: Patient Spontanous Breathing  Post-op Assessment: Report given to RN and Post -op Vital signs reviewed and stable  Post vital signs: Reviewed and stable  Last Vitals:  Filed Vitals:   04/15/15 1107  BP: 196/87  Pulse:   Temp:   Resp:     Complications: No apparent anesthesia complications

## 2015-04-15 NOTE — Anesthesia Preprocedure Evaluation (Signed)
Anesthesia Evaluation  Patient identified by MRN, date of birth, ID band Patient awake    Reviewed: Allergy & Precautions, NPO status , Patient's Chart, lab work & pertinent test results  Airway Mallampati: II   Neck ROM: full    Dental   Pulmonary former smoker,  breath sounds clear to auscultation        Cardiovascular hypertension, Rhythm:regular Rate:Normal     Neuro/Psych    GI/Hepatic GERD-  ,  Endo/Other  diabetes, Type 2obese  Renal/GU      Musculoskeletal  (+) Arthritis -,   Abdominal   Peds  Hematology   Anesthesia Other Findings   Reproductive/Obstetrics                             Anesthesia Physical Anesthesia Plan  ASA: II  Anesthesia Plan: Spinal   Post-op Pain Management:    Induction: Intravenous  Airway Management Planned: Simple Face Mask  Additional Equipment:   Intra-op Plan:   Post-operative Plan:   Informed Consent: I have reviewed the patients History and Physical, chart, labs and discussed the procedure including the risks, benefits and alternatives for the proposed anesthesia with the patient or authorized representative who has indicated his/her understanding and acceptance.     Plan Discussed with: CRNA, Anesthesiologist and Surgeon  Anesthesia Plan Comments:         Anesthesia Quick Evaluation

## 2015-04-15 NOTE — Discharge Instructions (Signed)

## 2015-04-15 NOTE — Progress Notes (Signed)
Orthopedic Tech Progress Note Patient Details:  Marylee FlorasClyde D Muma 1951-10-02 [id number] CPM applied to RLE with appropriate settings. OHF applied to bed. CPM Right Knee CPM Right Knee: On Right Knee Flexion (Degrees): 60 Right Knee Extension (Degrees): 0   Asia R Thompson 04/15/2015, 3:05 PM

## 2015-04-15 NOTE — Progress Notes (Signed)
Spoke with Dr Chaney MallingHodierne regarding elevated BP, last BP reading 200/84. Pain increasing 8/10. Ordered additional 150mcg Fentanyl titrated. Will continue to monitor for decrease in BP and pain level.

## 2015-04-15 NOTE — Anesthesia Postprocedure Evaluation (Signed)
  Anesthesia Post-op Note  Patient: Juan Watson  Procedure(s) Performed: Procedure(s): TOTAL KNEE ARTHROPLASTY (Right)  Patient Location: PACU  Anesthesia Type: Spinal   Level of Consciousness: awake, alert  and oriented  Airway and Oxygen Therapy: Patient Spontanous Breathing  Post-op Pain: moderate  Post-op Assessment: Post-op Vital signs reviewed  Post-op Vital Signs: Reviewed  Last Vitals:  Filed Vitals:   04/15/15 2137  BP: 167/98  Pulse: 80  Temp: 36.5 C  Resp:     Complications: No apparent anesthesia complications

## 2015-04-15 NOTE — Op Note (Signed)
PATIENT ID:      Juan FlorasClyde D Watson  MRN:     [id number] DOB/AGE:    Mar 13, 1951 / 64 y.o.       OPERATIVE REPORT    DATE OF PROCEDURE:  04/15/2015       PREOPERATIVE DIAGNOSIS:   RIGHT KNEE OSTEOARTHRITIS      There is no weight on file to calculate BMI.                                                        POSTOPERATIVE DIAGNOSIS:   RIGHT KNEE OSTEOARTHRITIS                                                                      PROCEDURE:  Procedure(s): TOTAL KNEE ARTHROPLASTY Using DepuyAttune RP implants #7R Femur, #8Tibia, 5 mm Attune RP bearing, 41 Patella     SURGEON: Jessalyn Hinojosa J    ASSISTANT:   Eric K. [employer] PA-C   (Present and scrubbed throughout the case, critical for assistance with exposure, retraction, instrumentation, and closure.)         ANESTHESIA: Spinal, Exparel  EBL: 300  FLUID REPLACEMENT: 1800 crystalloid  TOURNIQUET TIME: 15min  Drains: None  Tranexamic Acid: 1gm IV   COMPLICATIONS:  None         INDICATIONS FOR PROCEDURE: The patient has  RIGHT KNEE OSTEOARTHRITIS, varus deformities, XR shows bone on bone arthritis. Patient has failed all conservative measures including anti-inflammatory medicines, narcotics, attempts at  exercise and weight loss, cortisone injections and viscosupplementation.  Risks and benefits of surgery have been discussed, questions answered.   DESCRIPTION OF PROCEDURE: The patient identified by armband, received  IV antibiotics, in [facility] holding [facility]. Patient taken to the operating room, appropriate anesthetic  monitors were attached, and general endotracheal anesthesia induced with  the patient in supine position. Tourniquet  applied high to the operative thigh. Lateral post and foot positioner  applied to the table, the lower extremity was then prepped and draped  in usual sterile fashion from the ankle to the tourniquet. Time-out procedure was performed. We began the operation, with the knee flexed 100 degrees, by  making the anterior midline incision starting at handbreadth above the patella going over the patella 1 cm medial to and 4 cm distal to the tibial tubercle. Small bleeders in the skin and the  subcutaneous tissue identified and cauterized. Transverse retinaculum was incised and reflected medially and a medial parapatellar arthrotomy was accomplished. the patella was everted and theprepatellar fat pad resected. The superficial medial collateral  ligament was then elevated from anterior to posterior along the proximal  flare of the tibia and anterior half of the menisci resected. The knee was hyperflexed exposing bone on bone arthritis. Peripheral and notch osteophytes as well as the cruciate ligaments were then resected. We continued to  work our way around posteriorly along the proximal tibia, and externally  rotated the tibia subluxing it out from underneath the femur. A McHale  retractor was placed through the notch and a lateral Personal assistantHohmann retractor  placed, and we then drilled through the proximal tibia in line with the  axis of the tibia followed by an intramedullary guide rod and 2-degree  posterior slope cutting guide. The tibial cutting guide, 3 degree posterior sloped, was pinned into place allowing resection of 6 mm of bone medially and about 12 mm of bone laterally. Satisfied with the tibial resection, we then  entered the distal femur 2 mm anterior to the PCL origin with the  intramedullary guide rod and applied the distal femoral cutting guide  set at 9mm, with 5 degrees of valgus. This was pinned along the  epicondylar axis. At this point, the distal femoral cut was accomplished without difficulty. We then sized for a #7R femoral component and pinned the guide in 3 degrees of external rotation.The chamfer cutting guide was pinned into place. The anterior, posterior, and chamfer cuts were accomplished without difficulty followed by  the Attune RP box cutting guide and the box cut. We also  removed posterior osteophytes from the posterior femoral condyles. At this  time, the knee was brought into full extension. We checked our  extension and flexion gaps and found them symmetric for a 6 mm bearing. Distracting in extension with a lamina spreader, the posterior horns of the menisci were removed, and Exparel, diluted to 60 cc, was injected into the capsule of the knee. The  posterior patella cut was accomplished with the 9.5 mm Attune cutting guide, sized at 41* dome, and the fixation pegs drilled.The knee  was then once again hyperflexed exposing the proximal tibia. We sized for a #8 tibial base plate, applied the smokestack and the conical reamer followed by the the Delta fin keel punch. We then hammered into place the Attune RP trial femoral component, inserted a  6 mm trial bearing, trial patellar button, and took the knee through range of motion from 0-130 degrees. No thumb pressure was required for patellar  Tracking. At this point, the limb was wrapped with an Esmarch bandage and the tourniquet inflated to 350 mmHg. All trial components were removed, mating surfaces irrigated with pulse lavage, and dried with suction and sponges. A double batch of DePuy HV cement with 1500 mg of Zinacef was mixed and applied to all bony metallic mating surfaces except for the posterior condyles of the femur itself. In order, we  hammered into place the tibial tray and removed excess cement, the femoral component and removed excess cement,  The 6  Attune RP bearing  was inserted, and the knee brought to full extension with compression.  The patellar button was clamped into place, and excess cement  removed. While the cement cured the wound was irrigated out with normal saline solution pulse lavage. Ligament stability and patellar tracking were checked and found to be excellent. The parapatellar arthrotomy was closed with  running #1 Vicryl suture. The subcutaneous tissue with 0 and 2-0 undyed  Vicryl  suture, and the skin with running 3-0 SQ vicryl. A dressing of Xeroform,  4 x 4, dressing sponges, Webril, and Ace wrap applied. The patient  awakened, extubated, and taken to recovery room without difficulty.   Neveen Daponte J 04/15/2015, 1:56 PM

## 2015-04-15 NOTE — Anesthesia Procedure Notes (Signed)
Spinal Patient location during procedure: OR Start time: 04/15/2015 12:12 PM End time: 04/15/2015 12:20 PM Staffing Anesthesiologist: Saher Davee Performed by: anesthesiologist  Preanesthetic Checklist Completed: patient identified, site marked, surgical consent, pre-op evaluation, timeout performed, IV checked, risks and benefits discussed and monitors and equipment checked Spinal Block Patient position: sitting Prep: Betadine Patient monitoring: heart rate, cardiac monitor, continuous pulse ox and blood pressure Approach: midline Location: L3-4 Injection technique: single-shot Needle Needle type: Pencan  Needle gauge: 24 G Needle length: 10 cm Assessment Sensory level: T6 Additional Notes Pt tolerated the procedure well.

## 2015-04-16 LAB — CBC
HEMATOCRIT: 36.1 % — AB (ref 39.0–52.0)
Hemoglobin: 12.3 g/dL — ABNORMAL LOW (ref 13.0–17.0)
MCH: 27.8 pg (ref 26.0–34.0)
MCHC: 34.1 g/dL (ref 30.0–36.0)
MCV: 81.7 fL (ref 78.0–100.0)
Platelets: 228 10*3/uL (ref 150–400)
RBC: 4.42 MIL/uL (ref 4.22–5.81)
RDW: 12.9 % (ref 11.5–15.5)
WBC: 11.5 10*3/uL — ABNORMAL HIGH (ref 4.0–10.5)

## 2015-04-16 LAB — HEMOGLOBIN A1C
Hgb A1c MFr Bld: 6.7 % — ABNORMAL HIGH (ref 4.8–5.6)
MEAN PLASMA GLUCOSE: 146 mg/dL

## 2015-04-16 LAB — GLUCOSE, CAPILLARY
Glucose-Capillary: 143 mg/dL — ABNORMAL HIGH (ref 65–99)
Glucose-Capillary: 132 mg/dL — ABNORMAL HIGH (ref 65–99)

## 2015-04-16 NOTE — Progress Notes (Signed)
Paged on call Dr. Janee Mornhompson about elevated BP, manually 182/98 after PO metoprolol and IV dilaudid. No orders received. Will continue to monitor.   Lowella DellHudson, Adalynn Corne G 04/15/2015

## 2015-04-16 NOTE — Progress Notes (Signed)
Patient ID: Juan Watson, male   DOB: [birth year], 64 y.o.   MRN: [id number] PATIENT ID: Juan Watson  MRN: [id number]  DOB/AGE:  [birth year] / 64 y.o.  1 Day Post-Op Procedure(s) (LRB): TOTAL KNEE ARTHROPLASTY (Right)    PROGRESS NOTE Subjective: Patient is alert, oriented, x1 Nausea, no Vomiting, yes passing gas, no Bowel Movement. Taking PO well. Denies SOB, Chest or Calf Pain. Using Incentive Spirometer, PAS in place. Ambulate WBAT  today, CPM 0-40 Patient reports pain as 7 on 0-10 scale  .    Objective: Vital signs in last 24 hours: Filed Vitals:   04/15/15 2137 04/15/15 2200 04/16/15 0038 04/16/15 0635  BP: 167/98 182/98 179/92 163/96  Pulse: 80  81 80  Temp: 97.7 F (36.5 C)  97.9 F (36.6 C) 98.7 F (37.1 C)  TempSrc: Oral  Oral Oral  Resp:      SpO2: 97%  96% 98%      Intake/Output from previous day: I/O last 3 completed shifts: In: 1766.7 [I.V.:1766.7] Out: 175 [Urine:150; Blood:25]   Intake/Output this shift:     LABORATORY DATA:  Recent Labs  04/15/15 0942 04/15/15 1146 04/15/15 1416 04/16/15 0457  WBC  --   --   --  11.5*  HGB  --   --   --  12.3*  HCT  --   --   --  36.1*  PLT  --   --   --  228  GLUCAP 129* 107* 102*  --     Examination: Neurologically intact ABD soft Neurovascular intact Sensation intact distally Intact pulses distally Dorsiflexion/Plantar flexion intact Incision: dressing C/D/I No cellulitis present Compartment soft}  Assessment:   1 Day Post-Op Procedure(s) (LRB): TOTAL KNEE ARTHROPLASTY (Right) ADDITIONAL DIAGNOSIS: Expected Acute Blood Loss Anemia, Diabetes and Hypertension  Plan: PT/OT WBAT, CPM 5/hrs day until ROM 0-90 degrees, then D/C CPM DVT Prophylaxis:  SCDx72hrs, ASA 325 mg BID x 2 weeks DISCHARGE PLAN: Home DISCHARGE NEEDS: HHPT, CPM, Walker and 3-in-1 comode seat     Juan Watson J 04/16/2015, 7:56 AM

## 2015-04-16 NOTE — Plan of Care (Signed)
Problem: Consults Goal: Diagnosis- Total Joint Replacement Primary Total Knee Right     

## 2015-04-16 NOTE — Progress Notes (Signed)
Physical Therapy Treatment Patient Details Name: Juan FlorasClyde D Watson MRN: [id number] DOB: 1951/06/08 Today's Date: 04/16/2015    History of Present Illness Patient is a 64 y/o male s/p R TKA. PMH of HTN, DM and GERD.    PT Comments    Patient very lethargic and difficulty staying alert during treatment session limiting mobility. + nausea. Able to transfer back to bed with Mod A. Pt too lethargic to participate in further mobility or there ex- falling asleep once in the bed. Concerned about alertness and ability to participate due to pain medications. Discharge home is pending improvement in mobility next session. Will continue to follow and progress as tolerated and change discharge recommendations appropriately based on progress.   Follow Up Recommendations  Home health PT;Supervision/Assistance - 24 hour (PENDING PROGRESS.)     Equipment Recommendations  3in1 (PT)    Recommendations for Other Services OT consult     Precautions / Restrictions Precautions Precautions: Fall;Knee Precaution Booklet Issued: Yes (comment) Precaution Comments: Reviewed knee positioning, no pillow under knee and HEP. Restrictions Weight Bearing Restrictions: Yes RLE Weight Bearing: Weight bearing as tolerated    Mobility  Bed Mobility Overal bed mobility: Needs Assistance Bed Mobility: Sit to Supine     Supine to sit: Mod assist;HOB elevated Sit to supine: Min assist   General bed mobility comments: Min A to bring RLE into bed.   Transfers Overall transfer level: Needs assistance Equipment used: Rolling walker (2 wheeled) Transfers: Sit to/from Stand Sit to Stand: Mod assist Stand pivot transfers: Min assist       General transfer comment: Mod A to rise from chair x1. Cues for RLE placement and hand placement. + nausea, dizziness upon standing. Stood from Landscape architectchair x2.   Ambulation/Gait Ambulation/Gait assistance: Min assist Ambulation Distance (Feet): 3 Feet Assistive device: Rolling  walker (2 wheeled) Gait Pattern/deviations: Step-to pattern;Decreased stance time - right;Decreased step length - left;Trunk flexed;Shuffle   Gait velocity interpretation: Below normal speed for age/gender General Gait Details: Able to take a few steps towards chair with assist navigating RW and for balance. Lethargic.   Stairs            Wheelchair Mobility    Modified Rankin (Stroke Patients Only)       Balance Overall balance assessment: Needs assistance Sitting-balance support: Feet supported;Single extremity supported Sitting balance-Leahy Scale: Fair     Standing balance support: During functional activity Standing balance-Leahy Scale: Poor Standing balance comment: Relient on RW.                    Cognition Arousal/Alertness: Lethargic;Suspect due to medications Behavior During Therapy: [facility] for tasks assessed/performed Overall Cognitive Status: Within Functional Limits for tasks assessed                      Exercises Total Joint Exercises Ankle Circles/Pumps: Both;10 reps;Supine Quad Sets: Both;5 reps;Supine Heel Slides: Right;5 reps;AAROM;Seated Goniometric ROM: -20-55 degrees knee AROM    General Comments General comments (skin integrity, edema, etc.): Pt's wife present during session.      Pertinent Vitals/Pain Pain Assessment: Faces Pain Score: 9  Faces Pain Scale: Hurts little more Pain Location: right knee Pain Descriptors / Indicators: Sore Pain Intervention(s): Limited activity within patient's tolerance;Monitored during session;Repositioned;Premedicated before session    Home Living Family/patient expects to be discharged to:: Private residence Living Arrangements: Spouse/significant other Available Help at Discharge: Family;Available 24 hours/day Type of Home: House Home Access: Stairs to enter Entrance Stairs-Rails: Right  Home Layout: Able to live on main level with bedroom/bathroom;Bed/bath upstairs;Two level Home  Equipment: Crutches;Walker - 2 wheels      Prior Function Level of Independence: Independent          PT Goals (current goals can now be found in the care plan section) Acute Rehab PT Goals Patient Stated Goal: to get better PT Goal Formulation: With patient Time For Goal Achievement: 04/30/15 Potential to Achieve Goals: Fair Progress towards PT goals: Progressing toward goals    Frequency  BID    PT Plan Current plan remains appropriate    Co-evaluation             End of Session Equipment Utilized During Treatment: Gait belt Activity Tolerance: Patient limited by pain;Other (comment);Patient limited by lethargy (nausea) Patient left: in bed;with call bell/phone within reach;with bed alarm set;with family/visitor present     Time: [id number] PT Time Calculation (min) (ACUTE ONLY): 17 min  Charges:  $Therapeutic Exercise: 8-22 mins $Therapeutic Activity: 8-22 mins                    G Codes:      Juan Watson A Larrisa Cravey 04/16/2015, 1:58 PM Juan RedShauna Terren Watson, PT, DPT [phone]

## 2015-04-16 NOTE — Progress Notes (Signed)
OT Cancellation Note  Patient Details Name: Juan FlorasClyde D Mavis MRN: [id number] DOB: Jan 24, 1951   Cancelled Treatment:    Reason Eval/Treat Not Completed: Other (comment). Spoke with PT and pt having a hard time staying awake in session. Will plan to evaluate tomorrow.  Earlie RavelingStraub, Zamira Hickam L OTR/L [phone] 04/16/2015, 2:26 PM

## 2015-04-16 NOTE — Evaluation (Signed)
Physical Therapy Evaluation Patient Details Name: Juan Watson MRN: [id number] DOB: Mar 28, 1951 Today's Date: 04/16/2015   History of Present Illness  Patient is a 64 y/o male s/p R TKA. PMH of HTN, DM and GERD.  Clinical Impression  Patient presents with pain, post surgical deficits RLE s/p above surgery, lethargy and nausea impacting mobility. Pt with poor pain tolerance. Required Max A for transfers and increased time for all movement secondary to nausea and dizziness. Pt reports wanting to return home and will have 24/7 S. Pt will need to improve mobility to allow for safe d/c home. Will see in PM. Would benefit from skilled PT to improve transfers, gait, balance and mobiltiy so pt can maximize independence and return to PLOF.    Follow Up Recommendations Home health PT;Supervision/Assistance - 24 hour    Equipment Recommendations  3in1 (PT)    Recommendations for Other Services OT consult     Precautions / Restrictions Precautions Precautions: Fall;Knee Precaution Booklet Issued: Yes (comment) Precaution Comments: Reviewed knee positioning, no pillow under knee and HEP. Restrictions Weight Bearing Restrictions: Yes RLE Weight Bearing: Weight bearing as tolerated      Mobility  Bed Mobility Overal bed mobility: Needs Assistance Bed Mobility: Supine to Sit     Supine to sit: Mod assist;HOB elevated     General bed mobility comments: Mod A to bring RLE to EOB and elevate trunk. Cues for technique. + nausea, dizziness upon sitting.   Transfers Overall transfer level: Needs assistance Equipment used: Rolling walker (2 wheeled) Transfers: Sit to/from UGI CorporationStand;Stand Pivot Transfers Sit to Stand: Max assist;From elevated surface Stand pivot transfers: Min assist       General transfer comment: Max A x2 to rise from EOB on first attempt with cues for hand placement and anterior translation. SPT bed to chair with cues for technique and assist negotiating RW/advancing  RLE.  Ambulation/Gait Ambulation/Gait assistance: Min assist Ambulation Distance (Feet): 3 Feet Assistive device: Rolling walker (2 wheeled) Gait Pattern/deviations: Decreased stride length;Step-to pattern;Decreased stance time - right;Decreased step length - left;Trunk flexed;Shuffle   Gait velocity interpretation: Below normal speed for age/gender General Gait Details: Pt able to shuffle feet towards chair with assist navigating RW and for balance/support. + nausea, dizziness, diaphoretic. Vitals stable.  Stairs            Wheelchair Mobility    Modified Rankin (Stroke Patients Only)       Balance Overall balance assessment: Needs assistance Sitting-balance support: Feet supported;Single extremity supported Sitting balance-Leahy Scale: Fair     Standing balance support: During functional activity Standing balance-Leahy Scale: Poor Standing balance comment: Relient on RW.                             Pertinent Vitals/Pain Pain Assessment: 0-10 Pain Score: 9  Pain Location: right knee Pain Descriptors / Indicators: Sore;Aching;Grimacing;Guarding Pain Intervention(s): Limited activity within patient's tolerance;Monitored during session;Repositioned    Home Living Family/patient expects to be discharged to:: Private residence Living Arrangements: Spouse/significant other Available Help at Discharge: Family;Available 24 hours/day Type of Home: House Home Access: Stairs to enter Entrance Stairs-Rails: Right Entrance Stairs-Number of Steps: 2 Home Layout: Able to live on main level with bedroom/bathroom;Bed/bath upstairs;Two level Home Equipment: Crutches;Walker - 2 wheels      Prior Function Level of Independence: Independent               Hand Dominance  Extremity/Trunk Assessment   Upper Extremity Assessment: Defer to OT evaluation           Lower Extremity Assessment: RLE deficits/detail RLE Deficits / Details: Limited AROM  throughout hip, knee.       Communication   Communication: No difficulties  Cognition Arousal/Alertness: Lethargic;Suspect due to medications Behavior During Therapy: [facility] CenterWFL for tasks assessed/performed;Anxious Overall Cognitive Status: Within Functional Limits for tasks assessed                      General Comments      Exercises Total Joint Exercises Ankle Circles/Pumps: Both;10 reps;Supine Quad Sets: Both;5 reps;Supine Heel Slides: Right;5 reps;AAROM;Seated Goniometric ROM: -20-55 degrees knee AROM      Assessment/Plan    PT Assessment Patient needs continued PT services  PT Diagnosis Difficulty walking;Acute pain;Generalized weakness   PT Problem List Decreased strength;Pain;Decreased range of motion;Decreased activity tolerance;Decreased balance;Decreased mobility  PT Treatment Interventions Balance training;Gait training;Stair training;Patient/family education;Functional mobility training;Therapeutic activities;Therapeutic exercise   PT Goals (Current goals can be found in the Care Plan section) Acute Rehab PT Goals Patient Stated Goal: to get better PT Goal Formulation: With patient Time For Goal Achievement: 04/30/15 Potential to Achieve Goals: Fair    Frequency BID   Barriers to discharge        Co-evaluation               End of Session Equipment Utilized During Treatment: Gait belt Activity Tolerance: Patient limited by pain;Other (comment) (nausea.) Patient left: in chair;with call bell/phone within reach;with family/visitor present Nurse Communication: Mobility status;Need for lift equipment         Time: 1050-1140 PT Time Calculation (min) (ACUTE ONLY): 50 min   Charges:   PT Evaluation $Initial PT Evaluation Tier I: 1 Procedure PT Treatments $Therapeutic Exercise: 8-22 mins $Therapeutic Activity: 8-22 mins   PT G Codes:        Juan Watson 04/16/2015, 12:30 PM  Juan RedShauna Sho Watson, PT, DPT [phone]

## 2015-04-16 NOTE — Progress Notes (Signed)
CBG 143  

## 2015-04-17 ENCOUNTER — Encounter (HOSPITAL_COMMUNITY): Payer: Self-pay | Admitting: Orthopedic Surgery

## 2015-04-17 LAB — GLUCOSE, CAPILLARY
GLUCOSE-CAPILLARY: 132 mg/dL — AB (ref 65–99)
GLUCOSE-CAPILLARY: 166 mg/dL — AB (ref 65–99)
GLUCOSE-CAPILLARY: 150 mg/dL — AB (ref 65–99)
Glucose-Capillary: 142 mg/dL — ABNORMAL HIGH (ref 65–99)
Glucose-Capillary: 129 mg/dL — ABNORMAL HIGH (ref 65–99)
Glucose-Capillary: 140 mg/dL — ABNORMAL HIGH (ref 65–99)

## 2015-04-17 LAB — CBC
HCT: 35.4 % — ABNORMAL LOW (ref 39.0–52.0)
Hemoglobin: 12.3 g/dL — ABNORMAL LOW (ref 13.0–17.0)
MCH: 28.5 pg (ref 26.0–34.0)
MCHC: 34.7 g/dL (ref 30.0–36.0)
MCV: 81.9 fL (ref 78.0–100.0)
Platelets: 266 10*3/uL (ref 150–400)
RBC: 4.32 MIL/uL (ref 4.22–5.81)
RDW: 13 % (ref 11.5–15.5)
WBC: 12.2 10*3/uL — ABNORMAL HIGH (ref 4.0–10.5)

## 2015-04-17 MED ORDER — OXYCODONE-ACETAMINOPHEN 5-325 MG PO TABS: 1.0000 | ORAL_TABLET | Freq: Four times a day (QID) | ORAL | Status: DC | PRN

## 2015-04-17 MED ORDER — ASPIRIN EC 325 MG PO TBEC: 325.0000 mg | DELAYED_RELEASE_TABLET | Freq: Two times a day (BID) | ORAL | Status: DC

## 2015-04-17 MED ORDER — METHOCARBAMOL 500 MG PO TABS: 500.0000 mg | ORAL_TABLET | Freq: Two times a day (BID) | ORAL | Status: DC

## 2015-04-17 NOTE — Progress Notes (Signed)
Physical Therapy Treatment Patient Details Name: Juan FlorasClyde D Klingler MRN: [id number] DOB: 1951-01-20 Today's Date: 04/17/2015    History of Present Illness Patient is a 64 y/o male s/p R TKA. PMH of HTN, DM and GERD.    PT Comments    Patient progressing well with mobility. PM session focused on stair training with pt and family. Demonstrated safe stair negotiation technique to simulate home environment and recommend assist from son in law for safety. Mod A to ascend and Min A to descend. Reviewed HEP and answered all questions. Pt safe to discharge from a mobility stand point. Will continue to follow if still in hospital to maximize independence and safety.   Follow Up Recommendations  Home health PT;Supervision/Assistance - 24 hour     Equipment Recommendations  3in1 (PT)    Recommendations for Other Services       Precautions / Restrictions Precautions Precautions: Fall;Knee Precaution Booklet Issued: Yes (comment) Precaution Comments: Reviewed knee positioning, no pillow under knee and HEP. Restrictions Weight Bearing Restrictions: Yes RLE Weight Bearing: Weight bearing as tolerated    Mobility  Bed Mobility Overal bed mobility: Needs Assistance Bed Mobility: Supine to Sit     Supine to sit: Min assist Sit to supine: Min assist   General bed mobility comments: Min A to bring RLE to EOB.   Transfers Overall transfer level: Needs assistance Equipment used: Rolling walker (2 wheeled) Transfers: Sit to/from Stand Sit to Stand: Min guard         General transfer comment: Min guard for safety. Stood from AllstateEOB x1, from chair x1. Cues to keep RW with pt until getting to desired surface.  Ambulation/Gait Ambulation/Gait assistance: Min guard Ambulation Distance (Feet): 5 Feet Assistive device: Rolling walker (2 wheeled) Gait Pattern/deviations: Step-to pattern;Decreased stance time - right;Decreased step length - left;Trunk flexed;Shuffle   Gait velocity  interpretation: Below normal speed for age/gender General Gait Details: Pt with toe touch WB RLE initially progressing to heel strike with cues. Increased pain in knee.   Stairs Stairs: Yes Stairs assistance: Min assist Stair Management: One rail Right;Step to pattern;Forwards Number of Stairs: 2 General stair comments: Mod A to ascend steps with rail on RUE and therapist being rail on left side. Cues for technique. Min A to descend steps.   Wheelchair Mobility    Modified Rankin (Stroke Patients Only)       Balance Overall balance assessment: Needs assistance Sitting-balance support: Feet supported;No upper extremity supported Sitting balance-Leahy Scale: Fair     Standing balance support: During functional activity Standing balance-Leahy Scale: Poor                      Cognition Arousal/Alertness: Awake/alert Behavior During Therapy: WFL for tasks assessed/performed Overall Cognitive Status: Within Functional Limits for tasks assessed                      Exercises Total Joint Exercises Ankle Circles/Pumps: Both;10 reps;Seated Long Arc Quad: AAROM;Right;5 reps;Seated    General Comments        Pertinent Vitals/Pain Pain Assessment: Faces Faces Pain Scale: Hurts whole lot Pain Location: right knee with ROM Pain Descriptors / Indicators: Sore;Aching;Sharp Pain Intervention(s): Limited activity within patient's tolerance;Monitored during session;Patient requesting pain meds-RN notified;Repositioned    Home Living                      Prior Function  PT Goals (current goals can now be found in the care plan section) Progress towards PT goals: Progressing toward goals    Frequency  BID    PT Plan Current plan remains appropriate    Co-evaluation             End of Session Equipment Utilized During Treatment: Gait belt Activity Tolerance: Patient tolerated treatment well Patient left: in chair;with call  bell/phone within reach;with family/visitor present (RN made aware of pt ready to d/c.)     Time: 1410-1436 PT Time Calculation (min) (ACUTE ONLY): 26 min  Charges:  $Gait Training: 8-22 mins $Self Care/Home Management: 8-22                    G Codes:      Maryann Mccall A Shakisha Abend 04/17/2015, 3:12 PM  Mylo RedShauna Hedy Garro, PT, DPT [phone]

## 2015-04-17 NOTE — Progress Notes (Signed)
Physical Therapy Treatment Patient Details Name: Juan Watson MRN: [id number] DOB: [birth year] Today's Date: 04/17/2015    History of Present Illness Patient is a 64 y/o male s/p R TKA. PMH of HTN, DM and GERD.    PT Comments    Patient progressing well towards PT goals. Improved ambulation distance today. Pain improved today resulting in better activity tolerance. Reviewed HEP. Will need to perform stair training in PM prior to d/c. Will continue to follow to maximize independence.   Follow Up Recommendations  Home health PT;Supervision/Assistance - 24 hour     Equipment Recommendations  3in1 (PT)    Recommendations for Other Services       Precautions / Restrictions Precautions Precautions: Fall;Knee Precaution Booklet Issued: Yes (comment) Precaution Comments: Reviewed knee positioning, no pillow under knee and HEP. Restrictions Weight Bearing Restrictions: Yes RLE Weight Bearing: Weight bearing as tolerated    Mobility  Bed Mobility               General bed mobility comments: Sitting EOB upon PT arrival.   Transfers Overall transfer level: Needs assistance Equipment used: Rolling walker (2 wheeled) Transfers: Sit to/from Stand Sit to Stand: Min assist         General transfer comment: Min A to rise from EOB with cues for hand placement and technique. Pt pulling up on RW and RLE is extended when standing due to pain.  Ambulation/Gait Ambulation/Gait assistance: Min guard Ambulation Distance (Feet): 120 Feet Assistive device: Rolling walker (2 wheeled) Gait Pattern/deviations: Step-through pattern;Step-to pattern;Decreased stance time - right;Decreased step length - left;Trunk flexed   Gait velocity interpretation: Below normal speed for age/gender General Gait Details: Pt with toe touch WB RLE initially progressing to heel strike with cues. Increased knee flexion throughout gait cycle.    Stairs            Wheelchair Mobility    Modified  Rankin (Stroke Patients Only)       Balance Overall balance assessment: Needs assistance Sitting-balance support: Feet supported;No upper extremity supported Sitting balance-Leahy Scale: Fair     Standing balance support: During functional activity Standing balance-Leahy Scale: Poor                      Cognition Arousal/Alertness: Awake/alert Behavior During Therapy: WFL for tasks assessed/performed Overall Cognitive Status: Within Functional Limits for tasks assessed                      Exercises Total Joint Exercises Ankle Circles/Pumps: Both;20 reps;Supine Quad Sets: Right;5 reps;Seated Heel Slides: Right;AAROM;5 reps;Seated Goniometric ROM: -13-65 degrees knee AAROM.    General Comments        Pertinent Vitals/Pain Pain Assessment: 0-10 Pain Score: 3  Pain Location: right knee with movement Pain Descriptors / Indicators: Sore;Aching Pain Intervention(s): Monitored during session;Repositioned    Home Living                      Prior Function            PT Goals (current goals can now be found in the care plan section) Progress towards PT goals: Progressing toward goals    Frequency  BID    PT Plan Current plan remains appropriate    Co-evaluation             End of Session Equipment Utilized During Treatment: Gait belt Activity Tolerance: Patient tolerated treatment well Patient left: in chair;with call bell/phone  within reach;with family/visitor present     Time: [id number] PT Time Calculation (min) (ACUTE ONLY): 32 min  Charges:  $Gait Training: 8-22 mins $Therapeutic Exercise: 8-22 mins                    G Codes:      Josslyn Ciolek A Kaitlin Alcindor 04/17/2015, 10:27 AM  Mylo RedShauna Io Dieujuste, PT, DPT [phone]

## 2015-04-17 NOTE — Progress Notes (Signed)
Pt a/o, c/o pain pt stated with severe pain he "sees [place]", PRN po pain meds given as ordered and pt educated on deep breathing exercises to help with pain management, pt oob with PT today, will need to meet all PT goals prior to d/c. VSS pt stable

## 2015-04-17 NOTE — Progress Notes (Signed)
PATIENT ID: Marylee FlorasClyde D Steichen  MRN: [id number]  DOB/AGE:  October 08, 1951 / 64 y.o.  2 Days Post-Op Procedure(s) (LRB): TOTAL KNEE ARTHROPLASTY (Right)    PROGRESS NOTE Subjective: Patient is alert, oriented, no  Nausea, no Vomiting, yes passing gas, no Bowel Movement. Taking PO with small bites today.  He did complain of nausea yesterday, which limited therapy yesterday. Denies SOB, Chest or Calf Pain. Using Incentive Spirometer, PAS in place. Ambulate WBAT, CPM 0-60 Patient reports pain as 3 on 0-10 scale  .    Objective: Vital signs in last 24 hours: Filed Vitals:   04/16/15 0635 04/16/15 1400 04/16/15 2109 04/17/15 0518  BP: 163/96 101/56 140/69 115/63  Pulse: 80 71 74 95  Temp: 98.7 F (37.1 C) 98.2 F (36.8 C) 99.1 F (37.3 C) 98.7 F (37.1 C)  TempSrc: Oral  Oral   Resp:  18 18 17   SpO2: 98% 93% 95% 98%      Intake/Output from previous day: I/O last 3 completed shifts: In: 1126.7 [P.O.:360; I.V.:766.7] Out: 850 [Urine:850]   Intake/Output this shift:     LABORATORY DATA:  Recent Labs  04/16/15 0457 04/16/15 0633 04/16/15 2148 04/17/15 0448 04/17/15 0645  WBC 11.5*  --   --  12.2*  --   HGB 12.3*  --   --  12.3*  --   HCT 36.1*  --   --  35.4*  --   PLT 228  --   --  266  --   GLUCAP  --  143* 132*  --  142*    Examination: Neurologically intact Neurovascular intact Sensation intact distally Intact pulses distally Dorsiflexion/Plantar flexion intact Incision: dressing C/D/I No cellulitis present Compartment soft}  Assessment:   2 Days Post-Op Procedure(s) (LRB): TOTAL KNEE ARTHROPLASTY (Right) ADDITIONAL DIAGNOSIS: Expected Acute Blood Loss Anemia, Diabetes and Hypertension  Plan: PT/OT WBAT, CPM 5/hrs day until ROM 0-90 degrees, then D/C CPM DVT Prophylaxis:  SCDx72hrs, ASA 325 mg BID x 2 weeks DISCHARGE PLAN: Home, when pt passes therapy goals. DISCHARGE NEEDS: HHPT, HHRN, CPM, Walker and 3-in-1 comode seat     Nyonna Hargrove R 04/17/2015, 8:08  AM

## 2015-04-17 NOTE — Evaluation (Signed)
Occupational Therapy Evaluation Patient Details Name: Juan FlorasClyde D Omdahl MRN: [id number] DOB: 1951/01/27 Today's Date: 04/17/2015    History of Present Illness Patient is a 64 y/o male s/p R TKA. PMH of HTN, DM and GERD.   Clinical Impression   PTA pt lived at home and was independent with ADLs. Pt is limited by R knee pain and decreased ROM and requires assist for LB ADLs and min A for functional mobility. Pt will benefit from acute OT to progress to Supervision level to return home with family support.     Follow Up Recommendations  No OT follow up    Equipment Recommendations  None recommended by OT    Recommendations for Other Services       Precautions / Restrictions Precautions Precautions: Fall;Knee Precaution Booklet Issued: Yes (comment) Precaution Comments: Reviewed knee positioning, no pillow under knee and HEP. Restrictions Weight Bearing Restrictions: Yes RLE Weight Bearing: Weight bearing as tolerated      Mobility Bed Mobility Overal bed mobility: Needs Assistance Bed Mobility: Sit to Supine       Sit to supine: Min assist   General bed mobility comments: Assist to return RLE to bed and use of gait belt as leg lifter.  Transfers Overall transfer level: Needs assistance Equipment used: Rolling walker (2 wheeled) Transfers: Sit to/from Stand Sit to Stand: Min assist         General transfer comment: Min A to rise and steady RW.     Balance Overall balance assessment: Needs assistance Sitting-balance support: Feet supported;No upper extremity supported Sitting balance-Leahy Scale: Fair     Standing balance support: During functional activity Standing balance-Leahy Scale: Poor                              ADL Overall ADL's : Needs assistance/impaired Eating/Feeding: Independent;Sitting   Grooming: Min guard;Standing   Upper Body Bathing: Set up;Sitting   Lower Body Bathing: Minimal assistance;Sit to/from stand   Upper Body  Dressing : Set up;Sitting   Lower Body Dressing: Sit to/from stand;Minimal assistance Lower Body Dressing Details (indicate cue type and reason): Assist for RLE Toilet Transfer: Min guard;RW;Ambulation   Toileting- Clothing Manipulation and Hygiene: Min guard;Sit to/from Nurse, children'sstand     Tub/Shower Transfer Details (indicate cue type and reason): educated pt/wife on shower transfer technique Functional mobility during ADLs: Min guard;Rolling walker General ADL Comments: Pt limited by pain and fatigue during OT session. Pt ambulating from bathroom when OT arrived and assisted pt to sit EOB and don shorts. Pt returned to supine in bed with assistance and educated on use of leg lifter to assist with bed mobility.      Vision Additional Comments: No change from baseline   Perception Perception Perception Tested?: No   Praxis Praxis Praxis tested?: Not tested    Pertinent Vitals/Pain Pain Assessment: Faces Pain Score: 3  Faces Pain Scale: Hurts whole lot Pain Location: R knee Pain Descriptors / Indicators: Aching;Sore Pain Intervention(s): Limited activity within patient's tolerance;Monitored during session;Repositioned;Ice applied     Hand Dominance     Extremity/Trunk Assessment Upper Extremity Assessment Upper Extremity Assessment: Overall WFL for tasks assessed   Lower Extremity Assessment Lower Extremity Assessment: Defer to PT evaluation   Cervical / Trunk Assessment Cervical / Trunk Assessment: Normal   Communication Communication Communication: No difficulties   Cognition Arousal/Alertness: Awake/alert Behavior During Therapy: WFL for tasks assessed/performed Overall Cognitive Status: Within Functional Limits for tasks assessed  Home Living Family/patient expects to be discharged to:: Private residence Living Arrangements: Spouse/significant other Available Help at Discharge: Family;Available 24 hours/day Type of Home:  House Home Access: Stairs to enter Entergy CorporationEntrance Stairs-Number of Steps: 2 Entrance Stairs-Rails: Right Home Layout: Able to live on main level with bedroom/bathroom;Bed/bath upstairs;Two level     Bathroom Shower/Tub: Producer, television/film/videoWalk-in shower   Bathroom Toilet: Standard     Home Equipment: Crutches;Walker - 2 wheels;Shower seat - built in;Bedside commode          Prior Functioning/Environment Level of Independence: Independent             OT Diagnosis: Generalized weakness;Acute pain   OT Problem List: Decreased strength;Decreased range of motion;Decreased activity tolerance;Impaired balance (sitting and/or standing);Pain   OT Treatment/Interventions: Self-care/ADL training;Therapeutic exercise;Energy conservation;DME and/or AE instruction;Therapeutic activities;Patient/family education;Balance training    OT Goals(Current goals can be found in the care plan section) Acute Rehab OT Goals Patient Stated Goal: to be moving better OT Goal Formulation: With patient Time For Goal Achievement: 05/01/15 Potential to Achieve Goals: Good ADL Goals Pt Will Perform Grooming: with supervision;standing Pt Will Perform Lower Body Bathing: with set-up;with supervision;with adaptive equipment;sit to/from stand Pt Will Perform Lower Body Dressing: with set-up;with supervision;with adaptive equipment;sit to/from stand Pt Will Transfer to Toilet: with supervision;ambulating;bedside commode Pt Will Perform Toileting - Clothing Manipulation and hygiene: with supervision;sit to/from stand Pt Will Perform Tub/Shower Transfer: Shower transfer;with supervision;ambulating;shower seat;rolling walker  OT Frequency: Min 2X/week    End of Session Equipment Utilized During Treatment: Gait belt;Rolling walker CPM Right Knee CPM Right Knee: Off  Activity Tolerance: Patient limited by pain;Patient limited by fatigue Patient left: in bed;with call bell/phone within reach;with family/visitor present   Time:  [id number] OT Time Calculation (min): 23 min Charges:  OT General Charges $OT Visit: 1 Procedure OT Evaluation $Initial OT Evaluation Tier I: 1 Procedure OT Treatments $Self Care/Home Management : 8-22 mins G-Codes:    Rae LipsMiller, Ariea Rochin M 04/17/2015, 11:29 AM  Carney LivingLeeAnn Marie Tyr Franca, OTR/L Occupational Therapist [phone] (pager)

## 2015-04-17 NOTE — Discharge Summary (Signed)
Patient ID: Juan FlorasClyde D Ventresca MRN: [id number] DOB/AGE: [birth year] 64 y.o.  Admit date: 04/15/2015 Discharge date: 04/17/2015  Admission Diagnoses:  Principal Problem:   Primary osteoarthritis of right knee Active Problems:   Primary osteoarthritis of knee   Discharge Diagnoses:  Same  Past Medical History  Diagnosis Date  . Hypertension   . Diabetes mellitus without complication   . GERD (gastroesophageal reflux disease)   . Arthritis     Surgeries: Procedure(s): TOTAL KNEE ARTHROPLASTY on 04/15/2015   Consultants:    Discharged Condition: Improved  Hospital Course: Juan Watson is an 64 y.o. male who was admitted 04/15/2015 for operative treatment ofPrimary osteoarthritis of right knee. Patient has severe unremitting pain that affects sleep, daily activities, and work/hobbies. After pre-op clearance the patient was taken to the operating room on 04/15/2015 and underwent  Procedure(s): TOTAL KNEE ARTHROPLASTY.    Patient was given perioperative antibiotics: Anti-infectives    Start     Dose/Rate Route Frequency Ordered Stop   04/15/15 0927  ceFAZolin (ANCEF) 2-3 GM-% IVPB SOLR    Comments:  Macon LargePatterson, Lakea   : cabinet override      04/15/15 0927 04/15/15 2144   04/15/15 0915  ceFAZolin (ANCEF) IVPB 2 g/50 mL premix     2 g 100 mL/hr over 30 Minutes Intravenous On call to O.R. 04/15/15 0915 04/15/15 1220       Patient was given sequential compression devices, early ambulation, and chemoprophylaxis to prevent DVT.  Patient benefited maximally from hospital stay and there were no complications.    Recent vital signs: Patient Vitals for the past 24 hrs:  BP Temp Temp src Pulse Resp SpO2  04/17/15 1838 (!) 145/68 mmHg 98.1 F (36.7 C) Oral 88 18 94 %  04/17/15 0518 115/63 mmHg 98.7 F (37.1 C) - 95 17 98 %     Recent laboratory studies:  Recent Labs  04/16/15 0457 04/17/15 0448  WBC 11.5* 12.2*  HGB 12.3* 12.3*  HCT 36.1* 35.4*  PLT 228 266     Discharge  Medications:     Medication List    STOP taking these medications        lisinopril 40 MG tablet  Commonly known as:  PRINIVIL,ZESTRIL      TAKE these medications        aspirin EC 81 MG tablet  Take 81 mg by mouth at bedtime.     aspirin EC 325 MG tablet  Take 1 tablet (325 mg total) by mouth 2 (two) times daily.     cloNIDine 0.1 MG tablet  Commonly known as:  CATAPRES  Take 0.1 mg by mouth daily.     gemfibrozil 600 MG tablet  Commonly known as:  LOPID  Take 600 mg by mouth 2 (two) times daily.     Krill Oil 300 MG Caps  Take 600 mg by mouth daily.     labetalol 200 MG tablet  Commonly known as:  NORMODYNE  Take 200 mg by mouth 2 (two) times daily.     losartan 100 MG tablet  Commonly known as:  COZAAR  Take 100 mg by mouth daily.     metFORMIN 500 MG tablet  Commonly known as:  GLUCOPHAGE  Take 250 mg by mouth 2 (two) times daily.     methocarbamol 500 MG tablet  Commonly known as:  ROBAXIN  Take 1 tablet (500 mg total) by mouth 2 (two) times daily with a meal.     omeprazole 20 MG  capsule  Commonly known as:  PRILOSEC  Take 20 mg by mouth daily.     oxyCODONE-acetaminophen 5-325 MG per tablet  Commonly known as:  ROXICET  Take 1-2 tablets by mouth every 6 (six) hours as needed.     ULORIC 40 MG tablet  Generic drug:  febuxostat  Take 40 mg by mouth daily.     vitamin E 400 UNIT capsule  Take 400 Units by mouth daily.        Diagnostic Studies: Dg Chest 2 View  04/05/2015   CLINICAL DATA:  Preoperative evaluation. Hypertension. Diabetes mellitus.  EXAM: CHEST  2 VIEW  COMPARISON:  None.  FINDINGS: There is slight eventration of the left hemidiaphragm. There is no edema or consolidation. Heart size and pulmonary vascularity are normal. No adenopathy. No bone lesions.  IMPRESSION: No edema or consolidation.   Electronically Signed   By: Bretta BangWilliam  Woodruff III M.D.   On: 04/05/2015 09:54    Disposition: 01-Home or Self Care        Follow-up  Information    Follow up with Nestor LewandowskyOWAN,FRANK J, MD In 2 weeks.   Specialty:  Orthopedic Surgery   Contact information:   [address] [place] [address] [phone]       [facility]   Specialty:  Home Health Services   Why:  Someone [facility] will contact you concerning start date and time for therapy.   Contact information:   [address] [place] [address] [phone]        Signed: Henry RusselHILLIPS, ERIC R 04/17/2015, 9:24 PM

## 2015-04-17 NOTE — Care Management Note (Signed)
Case Management Note  Patient Details  Name: Marylee FlorasClyde D Dolinger MRN: [id number] Date of Birth: November 24, 1951  Subjective/Objective:      64 yr old male admitted with osteoarthritis of right knee. Patient underwent a right total knee arthroplasty.               Action/Plan:  Patient [facility]. No changes. Case manager [facility] and [facility]    Expected Discharge Date: 04/17/15                 [facility]     In-House Referral:  NA  Discharge planning Services  CM Consult  Post Acute Care Choice:  Home Health, Durable Medical Equipment Choice offered to:  Patient  DME Arranged:  3-N-1, CPM DME Agency:  [employer]  HH Arranged:  PT HH [facility]  Status of Service:  Completed, signed off  Medicare Important Message Given:    Date Medicare IM Given:    Medicare IM give by:    Date Additional Medicare IM Given:    Additional Medicare Important Message give by:     If discussed at Long Length of Stay Meetings, dates discussed:    Additional Comments:  Durenda GuthrieBrady, Salvador Bigbee Naomi, RN 04/17/2015, 3:55 PM

## 2015-11-15 DIAGNOSIS — M2140 Flat foot [pes planus] (acquired), unspecified foot: Secondary | ICD-10-CM

## 2015-11-15 DIAGNOSIS — M2141 Flat foot [pes planus] (acquired), right foot: Secondary | ICD-10-CM | POA: Insufficient documentation

## 2015-11-15 HISTORY — DX: Flat foot (pes planus) (acquired), unspecified foot: M21.40

## 2016-03-16 ENCOUNTER — Encounter: Payer: Self-pay | Admitting: *Deleted

## 2016-03-23 ENCOUNTER — Ambulatory Visit
Admission: RE | Admit: 2016-03-23 | Discharge: 2016-03-23 | Disposition: A | Discharge status: Home / Self Care | Payer: Medicare Other | Source: Ambulatory Visit | Attending: Ophthalmology | Admitting: Ophthalmology

## 2016-03-23 ENCOUNTER — Ambulatory Visit: Payer: Medicare Other | Admitting: Anesthesiology

## 2016-03-23 ENCOUNTER — Encounter: Payer: Self-pay | Admitting: *Deleted

## 2016-03-23 ENCOUNTER — Encounter
Admission: RE | Disposition: A | Discharge status: Home / Self Care | Payer: Self-pay | Source: Ambulatory Visit | Attending: Ophthalmology

## 2016-03-23 DIAGNOSIS — Z96651 Presence of right artificial knee joint: Secondary | ICD-10-CM | POA: Insufficient documentation

## 2016-03-23 DIAGNOSIS — H2511 Age-related nuclear cataract, right eye: Secondary | ICD-10-CM | POA: Diagnosis not present

## 2016-03-23 DIAGNOSIS — Z882 Allergy status to sulfonamides status: Secondary | ICD-10-CM | POA: Insufficient documentation

## 2016-03-23 DIAGNOSIS — I10 Essential (primary) hypertension: Secondary | ICD-10-CM | POA: Insufficient documentation

## 2016-03-23 DIAGNOSIS — M1990 Unspecified osteoarthritis, unspecified site: Secondary | ICD-10-CM | POA: Diagnosis not present

## 2016-03-23 DIAGNOSIS — K219 Gastro-esophageal reflux disease without esophagitis: Secondary | ICD-10-CM | POA: Insufficient documentation

## 2016-03-23 DIAGNOSIS — N400 Enlarged prostate without lower urinary tract symptoms: Secondary | ICD-10-CM | POA: Diagnosis not present

## 2016-03-23 DIAGNOSIS — Z87891 Personal history of nicotine dependence: Secondary | ICD-10-CM | POA: Diagnosis not present

## 2016-03-23 DIAGNOSIS — E119 Type 2 diabetes mellitus without complications: Secondary | ICD-10-CM | POA: Insufficient documentation

## 2016-03-23 DIAGNOSIS — H9193 Unspecified hearing loss, bilateral: Secondary | ICD-10-CM | POA: Insufficient documentation

## 2016-03-23 DIAGNOSIS — H25041 Posterior subcapsular polar age-related cataract, right eye: Secondary | ICD-10-CM | POA: Insufficient documentation

## 2016-03-23 DIAGNOSIS — H269 Unspecified cataract: Secondary | ICD-10-CM | POA: Diagnosis present

## 2016-03-23 HISTORY — DX: Benign prostatic hyperplasia without lower urinary tract symptoms: N40.0

## 2016-03-23 HISTORY — DX: Unspecified hearing loss, unspecified ear: H91.90

## 2016-03-23 HISTORY — PX: CATARACT EXTRACTION W/PHACO: SHX586

## 2016-03-23 LAB — GLUCOSE, CAPILLARY: GLUCOSE-CAPILLARY: 131 mg/dL — AB (ref 65–99)

## 2016-03-23 SURGERY — PHACOEMULSIFICATION, CATARACT, WITH IOL INSERTION
Anesthesia: General | Site: Eye | Laterality: Right | Wound class: Clean

## 2016-03-23 MED ORDER — POVIDONE-IODINE 5 % OP SOLN
OPHTHALMIC | Status: AC
  Filled 2016-03-23: qty 30

## 2016-03-23 MED ORDER — CYCLOPENTOLATE HCL 2 % OP SOLN
1.0000 [drp] | OPHTHALMIC | Status: DC | PRN
  Administered 2016-03-23 (×3): 1 [drp] via OPHTHALMIC

## 2016-03-23 MED ORDER — MOXIFLOXACIN HCL 0.5 % OP SOLN
1.0000 [drp] | OPHTHALMIC | Status: AC | PRN
  Administered 2016-03-23 (×3): 1 [drp] via OPHTHALMIC

## 2016-03-23 MED ORDER — CARBACHOL 0.01 % IO SOLN
INTRAOCULAR | Status: DC | PRN
  Administered 2016-03-23: .5 mL via INTRAOCULAR

## 2016-03-23 MED ORDER — PHENYLEPHRINE HCL 10 % OP SOLN
OPHTHALMIC | Status: AC
  Administered 2016-03-23: 1 [drp] via OPHTHALMIC
  Filled 2016-03-23: qty 5

## 2016-03-23 MED ORDER — LIDOCAINE HCL (PF) 4 % IJ SOLN
INTRAOCULAR | Status: DC | PRN
  Administered 2016-03-23: .5 mL via OPHTHALMIC

## 2016-03-23 MED ORDER — TETRACAINE HCL 0.5 % OP SOLN
OPHTHALMIC | Status: AC
  Filled 2016-03-23: qty 2

## 2016-03-23 MED ORDER — LIDOCAINE HCL (PF) 4 % IJ SOLN
INTRAMUSCULAR | Status: DC | PRN
  Administered 2016-03-23: 4 mL via OPHTHALMIC

## 2016-03-23 MED ORDER — MIDAZOLAM HCL 2 MG/2ML IJ SOLN
INTRAMUSCULAR | Status: DC | PRN
  Administered 2016-03-23: 1 mg via INTRAVENOUS

## 2016-03-23 MED ORDER — TETRACAINE HCL 0.5 % OP SOLN
OPHTHALMIC | Status: DC | PRN
  Administered 2016-03-23: 1 [drp] via OPHTHALMIC

## 2016-03-23 MED ORDER — MOXIFLOXACIN HCL 0.5 % OP SOLN
OPHTHALMIC | Status: AC
  Administered 2016-03-23: 1 [drp] via OPHTHALMIC
  Filled 2016-03-23: qty 3

## 2016-03-23 MED ORDER — POVIDONE-IODINE 5 % OP SOLN
OPHTHALMIC | Status: DC | PRN
  Administered 2016-03-23: 1 via OPHTHALMIC

## 2016-03-23 MED ORDER — ALFENTANIL 500 MCG/ML IJ INJ
INJECTION | INTRAMUSCULAR | Status: DC | PRN
  Administered 2016-03-23: 500 ug via INTRAVENOUS

## 2016-03-23 MED ORDER — PHENYLEPHRINE HCL 10 % OP SOLN
1.0000 [drp] | OPHTHALMIC | Status: AC | PRN
  Administered 2016-03-23 (×4): 1 [drp] via OPHTHALMIC

## 2016-03-23 MED ORDER — NA CHONDROIT SULF-NA HYALURON 40-17 MG/ML IO SOLN
INTRAOCULAR | Status: DC | PRN
  Administered 2016-03-23: 1 mL via INTRAOCULAR

## 2016-03-23 MED ORDER — BUPIVACAINE HCL (PF) 0.75 % IJ SOLN
INTRAMUSCULAR | Status: AC
  Filled 2016-03-23: qty 10

## 2016-03-23 MED ORDER — HYALURONIDASE HUMAN 150 UNIT/ML IJ SOLN
INTRAMUSCULAR | Status: AC
  Filled 2016-03-23: qty 1

## 2016-03-23 MED ORDER — CEFUROXIME OPHTHALMIC INJECTION 1 MG/0.1 ML
INJECTION | OPHTHALMIC | Status: AC
  Filled 2016-03-23: qty 0.1

## 2016-03-23 MED ORDER — NA CHONDROIT SULF-NA HYALURON 40-17 MG/ML IO SOLN
INTRAOCULAR | Status: AC
  Filled 2016-03-23: qty 1

## 2016-03-23 MED ORDER — CEFUROXIME OPHTHALMIC INJECTION 1 MG/0.1 ML
INJECTION | OPHTHALMIC | Status: DC | PRN
  Administered 2016-03-23: .1 mL via INTRACAMERAL

## 2016-03-23 MED ORDER — MOXIFLOXACIN HCL 0.5 % OP SOLN
OPHTHALMIC | Status: DC | PRN
  Administered 2016-03-23: 1 [drp] via OPHTHALMIC

## 2016-03-23 MED ORDER — SODIUM CHLORIDE 0.9 % IV SOLN
INTRAVENOUS | Status: DC
  Administered 2016-03-23 (×2): via INTRAVENOUS

## 2016-03-23 MED ORDER — CYCLOPENTOLATE HCL 2 % OP SOLN
OPHTHALMIC | Status: AC
  Administered 2016-03-23: 1 [drp] via OPHTHALMIC
  Filled 2016-03-23: qty 2

## 2016-03-23 MED ORDER — LIDOCAINE HCL (PF) 4 % IJ SOLN
INTRAMUSCULAR | Status: AC
  Filled 2016-03-23: qty 5

## 2016-03-23 MED ORDER — EPINEPHRINE HCL 1 MG/ML IJ SOLN
INTRAOCULAR | Status: DC | PRN
  Administered 2016-03-23: 1 mL via OPHTHALMIC

## 2016-03-23 MED ORDER — EPINEPHRINE HCL 1 MG/ML IJ SOLN
INTRAMUSCULAR | Status: AC
  Filled 2016-03-23: qty 2

## 2016-03-23 SURGICAL SUPPLY — 29 items

## 2016-03-23 NOTE — Anesthesia Preprocedure Evaluation (Addendum)
Anesthesia Evaluation  Patient identified by MRN, date of birth, ID band Patient awake    Reviewed: Allergy & Precautions, NPO status , Patient's Chart, lab work & pertinent test results, reviewed documented beta blocker date and time   Airway Mallampati: III  TM Distance: >3 FB Neck ROM: Full    Dental  (+) Upper Dentures, Lower Dentures   Pulmonary former smoker,    Pulmonary exam normal breath sounds clear to auscultation       Cardiovascular hypertension, Pt. on medications and Pt. on home beta blockers Normal cardiovascular exam     Neuro/Psych negative neurological ROS  negative psych ROS   GI/Hepatic Neg liver ROS, GERD  Medicated and Controlled,  Endo/Other  diabetes, Well Controlled, Type 2, Oral Hypoglycemic Agents  Renal/GU negative Renal ROS     Musculoskeletal  (+) Arthritis , Osteoarthritis,    Abdominal Normal abdominal exam  (+)   Peds negative pediatric ROS (+)  Hematology negative hematology ROS (+)   Anesthesia Other Findings   Reproductive/Obstetrics                            Anesthesia Physical Anesthesia Plan  ASA: III  Anesthesia Plan: General   Post-op Pain Management:    Induction: Intravenous  Airway Management Planned: Nasal Cannula  Additional Equipment:   Intra-op Plan:   Post-operative Plan:   Informed Consent: I have reviewed the patients History and Physical, chart, labs and discussed the procedure including the risks, benefits and alternatives for the proposed anesthesia with the patient or authorized representative who has indicated his/her understanding and acceptance.   Dental advisory given  Plan Discussed with: CRNA and Surgeon  Anesthesia Plan Comments:                                          Anesthesia Evaluation  Patient identified by MRN, date of birth, ID band Patient awake    Reviewed: Allergy & Precautions, NPO  status , Patient's Chart, lab work & pertinent test results  Airway Mallampati: II   Neck ROM: full    Dental   Pulmonary former smoker,  breath sounds clear to auscultation        Cardiovascular hypertension, Rhythm:regular Rate:Normal     Neuro/Psych    GI/Hepatic GERD-  ,  Endo/Other  diabetes, Type 2obese  Renal/GU      Musculoskeletal  (+) Arthritis -,   Abdominal   Peds  Hematology   Anesthesia Other Findings   Reproductive/Obstetrics                             Anesthesia Physical Anesthesia Plan  ASA: II  Anesthesia Plan: Spinal   Post-op Pain Management:    Induction: Intravenous  Airway Management Planned: Simple Face Mask  Additional Equipment:   Intra-op Plan:   Post-operative Plan:   Informed Consent: I have reviewed the patients History and Physical, chart, labs and discussed the procedure including the risks, benefits and alternatives for the proposed anesthesia with the patient or authorized representative who has indicated his/her understanding and acceptance.     Plan Discussed with: CRNA, Anesthesiologist and Surgeon  Anesthesia Plan Comments:         Anesthesia Quick Evaluation  Anesthesia Quick Evaluation

## 2016-03-23 NOTE — Transfer of Care (Signed)
Immediate Anesthesia Transfer of Care Note  Patient: Marylee FlorasClyde D Vanepps  Procedure(s) Performed: Procedure(s) with comments: CATARACT EXTRACTION PHACO AND INTRAOCULAR LENS PLACEMENT (IOC) (Right) - US 01:31 AP% 24.6 CDE 39.28 fluid pack lot # [id number]  Patient Location: PACU  Anesthesia Type:General  Level of Consciousness: awake, alert  and oriented  Airway & Oxygen Therapy: Patient Spontanous Breathing  Post-op Assessment: Report given to RN and Post -op Vital signs reviewed and stable  Post vital signs: Reviewed and stable  Last Vitals:  Filed Vitals:   03/23/16 0857 03/23/16 1121  BP: 135/67 125/77  Pulse: 63 52  Temp:  36.7 C  Resp: 16 16    Complications: No apparent anesthesia complications

## 2016-03-23 NOTE — Interval H&P Note (Signed)
History and Physical Interval Note:  03/23/2016 10:33 AM  Juan Watson  has presented today for surgery, with the diagnosis of cataract  The various methods of treatment have been discussed with the patient and family. After consideration of risks, benefits and other options for treatment, the patient has consented to  Procedure(s): CATARACT EXTRACTION PHACO AND INTRAOCULAR LENS PLACEMENT (IOC) (Right) as a surgical intervention .  The patient's history has been reviewed, patient examined, no change in status, stable for surgery.  I have reviewed the patient's chart and labs.  Questions were answered to the patient's satisfaction.     [name]

## 2016-03-23 NOTE — H&P (Signed)
See scanned note.

## 2016-03-23 NOTE — Op Note (Signed)
Date of Surgery: 03/23/2016 Date of Dictation: 03/23/2016 11:19 AM Pre-operative Diagnosis:  Nuclear Sclerotic Cataract and Posterior Subcapsular Cataract right Eye Post-operative Diagnosis: same Procedure performed: Extra-capsular Cataract Extraction (ECCE) with placement of a posterior chamber intraocular lens (IOL) right Eye IOL:  Implant Name Type Inv. Item Serial No. Manufacturer Lot No. LRB No. Used  LENS IOL ACRYSOF IQ 19.5 - [id number] Intraocular Lens LENS IOL ACRYSOF IQ 19.5 [id number] ALCON   Right 1   Anesthesia: 2% Lidocaine and 4% Marcaine in a 50/50 mixture with 10 unites/ml of Hylenex given as a peribulbar Anesthesiologist: Anesthesiologist: Yves DillPaul Carroll, MD CRNA: Evelena Peateborah Ferrero-Conover, CRNA Complications: none Estimated Blood Loss: less than 1 ml  Description of procedure:  The patient was given anesthesia and sedation via intravenous access. The patient was then prepped and draped in the usual fashion. A 25-gauge needle was bent for initiating the capsulorhexis. A 5-0 silk suture was placed through the conjunctiva superior and inferiorly to serve as bridle sutures. Hemostasis was obtained at the superior limbus using an eraser cautery. A partial thickness groove was made at the anterior surgical limbus with a 64 Beaver blade and this was dissected anteriorly with an SYSCOlcon Crescent knife. The anterior chamber was entered at 10 o'clock with a 1.0 mm paracentesis knife and through the lamellar dissection with a 2.6 mm Alcon keratome. Epi-Shugarcaine 0.5 CC [9 cc BSS Plus (Alcon), 3 cc 4% preservative-free lidocaine (Hospira) and 4 cc 1:1000 preservative-free, bisulfite-free epinephrine] was injected into the anterior chamber via the paracentesis tract. Epi-Shugarcaine 0.5 CC [9 cc BSS Plus (Alcon), 3 cc 4% preservative-free lidocaine (Hospira) and 4 cc 1:1000 preservative-free, bisulfite-free epinephrine] was injected into the anterior chamber via the paracentesis tract. DiscoVisc  was injected to replace the aqueous and a continuous tear curvilinear capsulorhexis was performed using a bent 25-gauge needle.  Balance salt on a syringe was used to perform hydro-dissection and phacoemulsification was carried out using a divide and conquer technique. Procedure(s) with comments: CATARACT EXTRACTION PHACO AND INTRAOCULAR LENS PLACEMENT (IOC) (Right) - US 01:31 AP% 24.6 CDE 39.28 fluid pack lot # [id number]. Irrigation/aspiration was used to remove the residual cortex and the capsular bag was inflated with DiscoVisc. The intraocular lens was inserted into the capsular bag using a pre-loaded UltraSert Delivery System. Irrigation/aspiration was used to remove the residual DiscoVisc. The wound was inflated with balanced salt and checked for leaks. None were found. Miostat was injected via the paracentesis track and 0.1 ml of cefuroxime containing 1 mg of drug  was injected via the paracentesis track. The wound was checked for leaks again and none were found.   The bridal sutures were removed and two drops of Vigamox were placed on the eye. An eye shield was placed to protect the eye and the patient was discharged to the recovery area in good condition.   Lorrayne Ismael MD

## 2016-03-23 NOTE — Discharge Instructions (Addendum)
Eye Surgery Discharge Instructions  Expect mild scratchy sensation or mild soreness. DO NOT RUB YOUR EYE!  The day of surgery:  Minimal physical activity, but bed rest is not required  No reading, computer work, or close hand work  No bending, lifting, or straining.  May watch TV  For 24 hours:  No driving, legal decisions, or alcoholic beverages  Safety precautions  Eat anything you prefer: It is better to start with liquids, then soup then solid foods.  _____ Eye patch should be worn until postoperative exam tomorrow.  ____ Solar shield eyeglasses should be worn for comfort in the sunlight/patch while sleeping  Resume all regular medications including aspirin or Coumadin if these were discontinued prior to surgery. You may shower, bathe, shave, or wash your hair. Tylenol may be taken for mild discomfort.  Call your doctor if you experience significant pain, nausea, or vomiting, fever > 101 or other signs of infection. [phone] or [phone] Specific instructions:  Follow-up Information    Follow up with Sallee LangeINGELDEIN,STEVEN, MD.   Specialty:  Ophthalmology   Why:  Tuesday 03/24/16  @ 8:15 am   Contact information:   [address]   [place] [address] [phone]

## 2016-03-24 NOTE — Anesthesia Postprocedure Evaluation (Signed)
Anesthesia Post Note  Patient: Juan Watson  Procedure(s) Performed: Procedure(s) (LRB): CATARACT EXTRACTION PHACO AND INTRAOCULAR LENS PLACEMENT (IOC) (Right)  Patient location during evaluation: PACU Anesthesia Type: General Level of consciousness: awake and alert and oriented Pain management: pain level controlled Vital Signs Assessment: post-procedure vital signs reviewed and stable Respiratory status: spontaneous breathing Cardiovascular status: blood pressure returned to baseline Anesthetic complications: no    Last Vitals:  Filed Vitals:   03/23/16 1121 03/23/16 1133  BP: 125/77 161/89  Pulse: 52 60  Temp: 36.7 C   Resp: 16 16    Last Pain: There were no vitals filed for this visit.               Mittie Knittel

## 2016-05-19 ENCOUNTER — Encounter: Payer: Self-pay | Admitting: Urology

## 2016-05-19 ENCOUNTER — Ambulatory Visit (INDEPENDENT_AMBULATORY_CARE_PROVIDER_SITE_OTHER): Payer: Medicare Other | Admitting: Urology

## 2016-05-19 VITALS — BP 196/93 | HR 62 | Ht 67.0 in | Wt 224.0 lb

## 2016-05-19 DIAGNOSIS — R310 Gross hematuria: Secondary | ICD-10-CM | POA: Diagnosis not present

## 2016-05-19 DIAGNOSIS — N400 Enlarged prostate without lower urinary tract symptoms: Secondary | ICD-10-CM

## 2016-05-19 DIAGNOSIS — R109 Unspecified abdominal pain: Secondary | ICD-10-CM

## 2016-05-19 LAB — URINALYSIS, COMPLETE
BILIRUBIN UA: NEGATIVE
Glucose, UA: NEGATIVE
KETONES UA: NEGATIVE
Leukocytes, UA: NEGATIVE
NITRITE UA: NEGATIVE
PH UA: 5.5 (ref 5.0–7.5)
Protein, UA: NEGATIVE
Urobilinogen, Ur: 0.2 mg/dL (ref 0.2–1.0)

## 2016-05-19 LAB — MICROSCOPIC EXAMINATION
Bacteria, UA: NONE SEEN
EPITHELIAL CELLS (NON RENAL): NONE SEEN /HPF (ref 0–10)
WBC, UA: NONE SEEN /hpf (ref 0–?)

## 2016-05-19 MED ORDER — DIAZEPAM 10 MG PO TABS: 10.0000 mg | ORAL_TABLET | Freq: Once | ORAL | Status: DC

## 2016-05-19 NOTE — Progress Notes (Signed)
05/19/2016 2:09 PM   Juan Watson 09/17/1951 [id number]  Referring provider: Carmin Richmond, MD [address] [place], [place] [address]  Chief Complaint  Patient presents with  . Hematuria    New Patient    HPI: 65 year old male referred for further evaluation of gross hematuria.  He reports that he has been having painless gross hematuria for approximately 3 weeks.  He has passed small clots.  He notices this more after riding his lawn mower.  No gross blood today.    He does endorse bilateral flank pain 2-3 weeks or more.  No fevers or chills.  No nausea or vomiting.  No personal history of kidney stones but his father had many stones.    He does have a personal history of BPH status post TURP by Dr. Tonita Phoenix is [facility] in 2004 or 2005.     He is not currently on any BPH medications.   He has not urinary frequency, urgency, or nocturia x 1.    He was seen and evaluated by his PCP yesterday which revealed trace blood with only 0-3 red blood cells per high-powered field, otherwise completely negative. There is no evidence of infection.  He takes a baby aspirin. He is on no other anticoagulants.  Most recent PSA 0.8 in 09/2014.  Recent rectal exam   Former smoker, 1-2 ppd x 16 years.  Quit '95.   He is retired from [employer] as [employer] and may have had chemical exposures.    He does have a personal history of gout.     PMH: Past Medical History  Diagnosis Date  . Hypertension   . Diabetes mellitus without complication (HCC)   . GERD (gastroesophageal reflux disease)   . Arthritis   . HOH (hard of hearing)     aids  . BPH (benign prostatic hyperplasia)     Surgical History: Past Surgical History  Procedure Laterality Date  . Tonsillectomy  72  . Back surgery  77,87  . Transurethral resection of prostate  ?  Marland Kitchen Hernia repair  11,    umbilical x2  . Knee debridement Left     tendon  . Cardiac catheterization  70's    pericarditis  .  Total knee arthroplasty Right 04/15/2015    Procedure: TOTAL KNEE ARTHROPLASTY;  Surgeon: Gean Birchwood, MD;  Location: [facility] OR;  Service: Orthopedics;  Laterality: Right;  . Cataract extraction w/phaco Right 03/23/2016    Procedure: CATARACT EXTRACTION PHACO AND INTRAOCULAR LENS PLACEMENT (IOC);  Surgeon: Sallee Lange, MD;  Location: [facility] ORS;  Service: Ophthalmology;  Laterality: Right;  [place] 01:31     Home Medications:    Medication List       This list is accurate as of: 05/19/16  2:09 PM.  Always use your most recent med list.               aspirin EC 81 MG tablet  Take 81 mg by mouth at bedtime.     cloNIDine 0.1 MG tablet  Commonly known as:  CATAPRES  Take 0.1 mg by mouth 2 (two) times daily.     gemfibrozil 600 MG tablet  Commonly known as:  LOPID  Take 600 mg by mouth 2 (two) times daily.     Krill Oil 300 MG Caps  Take 600 mg by mouth daily.     labetalol 200 MG tablet  Commonly known as:  NORMODYNE  Take 200 mg by mouth 2 (  two) times daily.     losartan 100 MG tablet  Commonly known as:  COZAAR  Take 100 mg by mouth daily.     metFORMIN 500 MG tablet  Commonly known as:  GLUCOPHAGE  Take 250 mg by mouth 2 (two) times daily.     omeprazole 20 MG capsule  Commonly known as:  PRILOSEC  Take 20 mg by mouth daily.     vitamin E 400 UNIT capsule  Take 400 Units by mouth daily. Reported on 03/23/2016        Allergies:  Allergies  Allergen Reactions  . Bee Venom Swelling    Sob, swelling  . Statins     Other reaction(s): Other (See Comments) SEVERE MYALGIAS  . Sulfa Antibiotics Swelling    Tongue swelling    Family History: Family History  Problem Relation Age of Onset  . Nephrolithiasis Neg Hx   . Bladder Cancer Neg Hx   . Prostate cancer Neg Hx   . Kidney cancer Neg Hx     Social History:  reports that he quit smoking about 24 years ago. His smoking use included Cigarettes. He has a 50 pack-year smoking history. He does not have any  smokeless tobacco history on file. He reports that he does not drink alcohol or use illicit drugs.  ROS: UROLOGY Frequent Urination?: No Hard to postpone urination?: No Burning/pain with urination?: No Get up at night to urinate?: Yes Leakage of urine?: No Urine stream starts and stops?: No Trouble starting stream?: No Do you have to strain to urinate?: No Blood in urine?: Yes Urinary tract infection?: No Sexually transmitted disease?: No Injury to kidneys or bladder?: No Painful intercourse?: No Weak stream?: No Erection problems?: Yes Penile pain?: No  Gastrointestinal Nausea?: No Vomiting?: No Indigestion/heartburn?: No Diarrhea?: No Constipation?: No  Constitutional Fever: No Night sweats?: No Weight loss?: No Fatigue?: No  Skin Skin rash/lesions?: No Itching?: No  Eyes Blurred vision?: No Double vision?: No  Ears/Nose/Throat Sore throat?: No Sinus problems?: No  Hematologic/Lymphatic Swollen glands?: No Easy bruising?: No  Cardiovascular Leg swelling?: No Chest pain?: No  Respiratory Cough?: No Shortness of breath?: No  Endocrine Excessive thirst?: No  Musculoskeletal Back pain?: Yes Joint pain?: Yes  Neurological Headaches?: No Dizziness?: No  Psychologic Depression?: No Anxiety?: No  Physical Exam: BP 196/93 mmHg  Pulse 62  Ht 5\' 7"  (1.702 m)  Wt 224 lb (101.606 kg)  BMI 35.08 kg/m2  Constitutional:  Alert and oriented, No acute distress.  Accompanied by wife today.   HEENT: [place] AT, moist mucus membranes.  Trachea midline, no masses. Cardiovascular: No clubbing, cyanosis, or edema. Respiratory: Normal respiratory effort, no increased work of breathing. GI: Abdomen is soft, nontender, nondistended, no abdominal masses GU: No CVA tenderness.  Rectal: Normal sphincter tone.  50 g prostate, rubbery, no nodules.   Skin: No rashes, bruises or suspicious lesions. Lymph: No cervicaladenopathy. Neurologic: Grossly intact, no focal  deficits, moving all 4 extremities. Psychiatric: Normal mood and affect.  Laboratory Data: Lab Results  Component Value Date   WBC 12.2* 04/17/2015   HGB 12.3* 04/17/2015   HCT 35.4* 04/17/2015   MCV 81.9 04/17/2015   PLT 266 04/17/2015    Lab Results  Component Value Date   CREATININE 1.01 04/05/2015    Lab Results  Component Value Date   HGBA1C 6.7* 04/15/2015    Urinalysis UA today negative, see EPIC  Pertinent Imaging: N/[facility]  Assessment & Plan:  1. Gross hematuria We discussed the differential diagnosis for microscopic hematuria including nephrolithiasis, renal or upper tract tumors, BPH, bladder stones, UTIs, or bladder tumors as well as undetermined etiologies. Per AUA guidelines, I did recommend complete microscopic hematuria evaluation including CTU, possible urine cytology, and office cystoscopy. - Urinalysis, Complete - CT HEMATURIA WORKUP; Future  2. BPH (benign prostatic hyperplasia) History of BPH s/p TURP Currently asymptomatic  3. Bilateral flank pain Will evaluate further CT scan At risk for uric acid stones given history of gout Warning symptoms reviewed  Return in about 2 weeks (around 06/02/2016) for cystoscopy.  Vanna Scotland, MD  [facility] [address], [address] [place], [place] [address] [phone]

## 2016-05-29 ENCOUNTER — Ambulatory Visit
Admission: RE | Admit: 2016-05-29 | Discharge: 2016-05-29 | Disposition: A | Discharge status: Home / Self Care | Payer: Medicare Other | Source: Ambulatory Visit | Attending: Urology | Admitting: Urology

## 2016-05-29 DIAGNOSIS — R310 Gross hematuria: Secondary | ICD-10-CM | POA: Insufficient documentation

## 2016-05-29 DIAGNOSIS — K760 Fatty (change of) liver, not elsewhere classified: Secondary | ICD-10-CM | POA: Diagnosis not present

## 2016-05-29 LAB — POCT I-STAT CREATININE: Creatinine, Ser: 1.1 mg/dL (ref 0.61–1.24)

## 2016-05-29 MED ORDER — IOPAMIDOL (ISOVUE-300) INJECTION 61%
125.0000 mL | Freq: Once | INTRAVENOUS | Status: AC | PRN
  Administered 2016-05-29: 125 mL via INTRAVENOUS

## 2016-06-01 ENCOUNTER — Ambulatory Visit (INDEPENDENT_AMBULATORY_CARE_PROVIDER_SITE_OTHER): Payer: Medicare Other | Admitting: Urology

## 2016-06-01 ENCOUNTER — Encounter: Payer: Self-pay | Admitting: Urology

## 2016-06-01 VITALS — BP 191/95 | HR 65 | Ht 67.0 in | Wt 224.0 lb

## 2016-06-01 DIAGNOSIS — N400 Enlarged prostate without lower urinary tract symptoms: Secondary | ICD-10-CM | POA: Diagnosis not present

## 2016-06-01 DIAGNOSIS — R109 Unspecified abdominal pain: Secondary | ICD-10-CM

## 2016-06-01 DIAGNOSIS — R310 Gross hematuria: Secondary | ICD-10-CM

## 2016-06-01 LAB — MICROSCOPIC EXAMINATION
Bacteria, UA: NONE SEEN
Epithelial Cells (non renal): NONE SEEN /hpf (ref 0–10)

## 2016-06-01 LAB — URINALYSIS, COMPLETE
Bilirubin, UA: NEGATIVE
GLUCOSE, UA: NEGATIVE
KETONES UA: NEGATIVE
LEUKOCYTES UA: NEGATIVE
Nitrite, UA: NEGATIVE
PROTEIN UA: NEGATIVE
SPEC GRAV UA: 1.01 (ref 1.005–1.030)
Urobilinogen, Ur: 0.2 mg/dL (ref 0.2–1.0)
pH, UA: 5.5 (ref 5.0–7.5)

## 2016-06-01 MED ORDER — LIDOCAINE HCL 2 % EX GEL
1.0000 "application " | Freq: Once | CUTANEOUS | Status: AC
  Administered 2016-06-01: 1 via URETHRAL

## 2016-06-01 MED ORDER — CIPROFLOXACIN HCL 500 MG PO TABS
500.0000 mg | ORAL_TABLET | Freq: Once | ORAL | Status: AC
  Administered 2016-06-01: 500 mg via ORAL

## 2016-06-01 NOTE — Progress Notes (Signed)
3:18 PM  06/01/2016   Juan FlorasClyde D Donnelly 02/18/1951 [id number]  Referring provider: Carmin RichmondJames W Davis, MD [address] [place], [place] [id number]  Chief Complaint  Patient presents with  . Cysto    HPI: 65 year old male referred for further evaluation of gross hematuria who returns to the office today to follow his CT urogram as well as a cystoscopy.  CT urogram shows no evidence of GU pathology. Incidental finding of fatty liver.    He reports that he has been having painless gross hematuria for approximately one month ago.  He has passed small clots.  He notices this more after riding his lawn mower.  No gross blood today.   No evidence of UTIs.   He does endorse bilateral flank pain for greater than 1 month.  No fevers or chills.  No nausea or vomiting.  No personal history of kidney stones.     He does have a personal history of BPH status post [facility].     He is not currently on any BPH medications.   He has not urinary frequency, urgency, or nocturia x 1.    He takes a baby aspirin. He is on no other anticoagulants.  Most recent PSA 0.8 in 09/2014.  Recent rectal exam in office 50 g rubbery, no nodules.    Former smoker, 1-2 ppd x 16 years.  Quit '95.   He is retired from [employer] as [employer] and may have had chemical exposures.     PMH: Past Medical History  Diagnosis Date  . Hypertension   . Diabetes mellitus without complication (HCC)   . GERD (gastroesophageal reflux disease)   . Arthritis   . HOH (hard of hearing)     aids  . BPH (benign prostatic hyperplasia)     Surgical History: Past Surgical History  Procedure Laterality Date  . Tonsillectomy  72  . Back surgery  77,87  . Transurethral resection of prostate  ?  Marland Kitchen. Hernia repair  11,    umbilical x2  . Knee debridement Left     tendon  . Cardiac catheterization  70's    pericarditis  . Total knee arthroplasty Right 04/15/2015   Procedure: TOTAL KNEE ARTHROPLASTY;  Surgeon: Gean BirchwoodFrank Rowan, MD;  [facility];  Service: Orthopedics;  Laterality: Right;  . Cataract extraction w/phaco Right 03/23/2016    Procedure: CATARACT EXTRACTION PHACO AND INTRAOCULAR LENS PLACEMENT (IOC);  Surgeon: Sallee LangeSteven Dingeldein, MD;  Location: [facility] ORS;  Service: Ophthalmology;  Laterality: Right;  US 01:31     Home Medications:    Medication List       This list is accurate as of: 06/01/16  3:18 PM.  Always use your most recent med list.               aspirin EC 81 MG tablet  Take 81 mg by mouth at bedtime.     cloNIDine 0.1 MG tablet  Commonly known as:  CATAPRES  Take 0.1 mg by mouth 2 (two) times daily.     diazepam 10 MG tablet  Commonly known as:  VALIUM  Take 1 tablet (10 mg total) by mouth once. Take 1 hour prior to CT scan for anxiety     gemfibrozil 600 MG tablet  Commonly known as:  LOPID  Take 600 mg by mouth 2 (two) times daily.     Krill Oil 300 MG Caps  Take 600 mg by mouth daily.     labetalol 200 MG tablet  Commonly known as:  NORMODYNE  Take 200 mg by mouth 2 (two) times daily.     losartan 100 MG tablet  Commonly known as:  COZAAR  Take 100 mg by mouth daily.     metFORMIN 500 MG tablet  Commonly known as:  GLUCOPHAGE  Take 250 mg by mouth 2 (two) times daily.     omeprazole 20 MG capsule  Commonly known as:  PRILOSEC  Take 20 mg by mouth daily.     vitamin E 400 UNIT capsule  Take 400 Units by mouth daily. Reported on 03/23/2016        Allergies:  Allergies  Allergen Reactions  . Bee Venom Swelling    Sob, swelling  . Statins     Other reaction(s): Other (See Comments) SEVERE MYALGIAS  . Sulfa Antibiotics Swelling    Tongue swelling    Family History: Family History  Problem Relation Age of Onset  . Nephrolithiasis Neg Hx   . Bladder Cancer Neg Hx   . Prostate cancer Neg Hx   . Kidney cancer Neg Hx     Social History:  reports that he quit smoking about 24 years ago. His  smoking use included Cigarettes. He has a 50 pack-year smoking history. He does not have any smokeless tobacco history on file. He reports that he does not drink alcohol or use illicit drugs.  Physical Exam: BP 191/95 mmHg  Pulse 65  Ht 5\' 7"  (1.702 m)  Wt 224 lb (101.606 kg)  BMI 35.08 kg/m2  Constitutional:  Alert and oriented, No acute distress.  Accompanied by wife today.   HEENT: [place] AT, moist mucus membranes.  Trachea midline, no masses. Cardiovascular: No clubbing, cyanosis, or edema. Respiratory: Normal respiratory effort, no increased work of breathing. GI: Abdomen is soft, nontender, nondistended, no abdominal masses GU: No CVA tenderness. Circumcised phallus with orthotopic meatus. Skin: No rashes, bruises or suspicious lesions. Neurologic: Grossly intact, no focal deficits, moving all 4 extremities. Psychiatric: Normal mood and affect.  Laboratory Data: Lab Results  Component Value Date   WBC 12.2* 04/17/2015   HGB 12.3* 04/17/2015   HCT 35.4* 04/17/2015   MCV 81.9 04/17/2015   PLT 266 04/17/2015    Lab Results  Component Value Date   CREATININE 1.10 05/29/2016    Lab Results  Component Value Date   HGBA1C 6.7* 04/15/2015    Urinalysis UA today negative, see EPIC  Pertinent Imaging:  Study Result     CLINICAL DATA: Patient with gross hematuria for 3 weeks. Bilateral flank pain for 2-3 weeks.  EXAM: CT ABDOMEN AND PELVIS WITHOUT AND WITH CONTRAST  TECHNIQUE: Multidetector CT imaging of the abdomen and pelvis was performed following the standard protocol before and following the bolus administration of intravenous contrast.  CONTRAST: 125mL ISOVUE-300 IOPAMIDOL (ISOVUE-300) INJECTION 61%  COMPARISON: None.  FINDINGS: Lower chest: Normal heart size. No pericardial effusion. No consolidative pulmonary opacities. No pleural effusion.  Hepatobiliary: Liver is diffusely low in attenuation compatible with hepatic steatosis. Fatty  deposition adjacent to the falciform ligament. Gallbladder is decompressed. Small stone within the gallbladder lumen.  Pancreas: Unremarkable  Spleen: Unremarkable  Adrenals/Urinary Tract: Normal adrenal glands. Kidneys enhance symmetrically with contrast. No hydronephrosis. No nephroureterolithiasis. No suspicious enhancing renal masses are identified. Delayed images demonstrate excretion of contrast material to the bilateral renal collecting systems, ureters and bladder. No abnormal filling defects identified within the opacified portions.  Stomach/Bowel: No abnormal bowel wall thickening or evidence for bowel obstruction. No free fluid or free intraperitoneal air. Normal appendix.  Vascular/Lymphatic: Normal caliber abdominal aorta. Peripheral calcified atherosclerotic plaque. No retroperitoneal lymphadenopathy.  Other: Small fat containing bilateral inguinal hernias.  Musculoskeletal: Thoracic spine degenerative changes. No aggressive or acute appearing osseous lesions.  IMPRESSION: No nephroureterolithiasis. No hydronephrosis. No suspicious enhancing renal masses are identified. No abnormal filling defects identified within the bilateral renal collecting systems and ureters.  Hepatic steatosis.   Electronically Signed  By: Annia Belt M.D.  On: 05/29/2016 13:56    CT abdomen and pelvis personally reviewed.     Cystoscopy Procedure Note  Patient identification was confirmed, informed consent was obtained, and patient was prepped using Betadine solution.  Lidocaine jelly was administered per urethral meatus.    Preoperative abx where received prior to procedure.     Pre-Procedure: - Inspection reveals a normal caliber ureteral meatus.  Procedure: The flexible cystoscope was introduced without difficulty - No urethral strictures/lesions are present. - Irregular prostate fossa with coapting lateral lobes, friable mucosa with bleeding noted  with instrumentation - Normal bladder neck - Bilateral ureteral orifices identified - Bladder mucosa  reveals no ulcers, tumors, or lesions - No bladder stones - No trabeculation  Retroflexion shows TURP defect from appreciated with kissing lateral lobes growing with widened bladder neck   Post-Procedure: - Patient tolerated the procedure well   Assessment & Plan:    1. Gross hematuria Likley secondary to prostatic bleeding, prostatic regrowth appreciated on cysto today No other pathology identified  2. BPH (benign prostatic hyperplasia) History of BPH s/p TURP Currently asymptomatic Prostatic regrowth appreciated today but asymptomatic, recommend no further treatment at this time Patient continues to have intermittent episodes of hematuria or becomes symptomatic, would consider starting finasteride which was discussed today with the patient He will follow up needed  3. Bilateral flank pain Not likely related to kidneys - likely MSK  Return if symptoms worsen or fail to improve.  Vanna Scotland, MD  [facility] [address] [place], [place] [address] [phone]

## 2016-07-31 DIAGNOSIS — F4320 Adjustment disorder, unspecified: Secondary | ICD-10-CM | POA: Insufficient documentation

## 2016-07-31 DIAGNOSIS — F4321 Adjustment disorder with depressed mood: Secondary | ICD-10-CM | POA: Insufficient documentation

## 2016-09-16 ENCOUNTER — Emergency Department
Admission: EM | Admit: 2016-09-16 | Discharge: 2016-09-17 | Disposition: A | Discharge status: Home / Self Care | Payer: Medicare Other | Attending: Emergency Medicine | Admitting: Emergency Medicine

## 2016-09-16 ENCOUNTER — Emergency Department: Payer: Medicare Other

## 2016-09-16 DIAGNOSIS — Z79899 Other long term (current) drug therapy: Secondary | ICD-10-CM | POA: Diagnosis not present

## 2016-09-16 DIAGNOSIS — Z7982 Long term (current) use of aspirin: Secondary | ICD-10-CM | POA: Insufficient documentation

## 2016-09-16 DIAGNOSIS — I10 Essential (primary) hypertension: Secondary | ICD-10-CM | POA: Diagnosis not present

## 2016-09-16 DIAGNOSIS — Z7984 Long term (current) use of oral hypoglycemic drugs: Secondary | ICD-10-CM | POA: Insufficient documentation

## 2016-09-16 DIAGNOSIS — Z87891 Personal history of nicotine dependence: Secondary | ICD-10-CM | POA: Insufficient documentation

## 2016-09-16 DIAGNOSIS — R519 Headache, unspecified: Secondary | ICD-10-CM

## 2016-09-16 DIAGNOSIS — R42 Dizziness and giddiness: Secondary | ICD-10-CM | POA: Diagnosis present

## 2016-09-16 DIAGNOSIS — E119 Type 2 diabetes mellitus without complications: Secondary | ICD-10-CM | POA: Diagnosis not present

## 2016-09-16 DIAGNOSIS — R51 Headache: Secondary | ICD-10-CM

## 2016-09-16 LAB — BASIC METABOLIC PANEL
Anion gap: 8 (ref 5–15)
BUN: 25 mg/dL — AB (ref 6–20)
CALCIUM: 9.4 mg/dL (ref 8.9–10.3)
CO2: 25 mmol/L (ref 22–32)
CREATININE: 1.32 mg/dL — AB (ref 0.61–1.24)
Chloride: 105 mmol/L (ref 101–111)
GFR calc Af Amer: >60 mL/min (ref >60)
GFR, EST NON AFRICAN AMERICAN: 55 mL/min — AB (ref >60)
GLUCOSE: 121 mg/dL — AB (ref 65–99)
Potassium: 4.3 mmol/L (ref 3.5–5.1)
Sodium: 138 mmol/L (ref 135–145)

## 2016-09-16 LAB — CBC
HCT: 42.1 % (ref 40.0–52.0)
HEMOGLOBIN: 14.6 g/dL (ref 13.0–18.0)
MCH: 28.9 pg (ref 26.0–34.0)
MCHC: 34.7 g/dL (ref 32.0–36.0)
MCV: 83.1 fL (ref 80.0–100.0)
Platelets: 226 10*3/uL (ref 150–440)
RBC: 5.07 MIL/uL (ref 4.40–5.90)
RDW: 13.2 % (ref 11.5–14.5)
WBC: 6.7 10*3/uL (ref 3.8–10.6)

## 2016-09-16 LAB — TROPONIN I

## 2016-09-16 MED ORDER — LORAZEPAM 2 MG/ML IJ SOLN
1.0000 mg | Freq: Once | INTRAMUSCULAR | Status: AC
  Administered 2016-09-16: 1 mg via INTRAVENOUS

## 2016-09-16 MED ORDER — LORAZEPAM 2 MG/ML IJ SOLN
INTRAMUSCULAR | Status: AC
Start: 2016-09-16 — End: 2016-09-16
  Administered 2016-09-16: 1 mg via INTRAVENOUS
  Filled 2016-09-16: qty 1

## 2016-09-16 MED ORDER — HYDRALAZINE HCL 20 MG/ML IJ SOLN
5.0000 mg | Freq: Once | INTRAMUSCULAR | Status: AC
  Administered 2016-09-16: 5 mg via INTRAVENOUS
  Filled 2016-09-16: qty 1

## 2016-09-16 NOTE — ED Triage Notes (Signed)
Pt to triage via w/c with no distress noted; st reports HA to temples accomp by dizziness; st hx of same with elevated BP; BP 190/88 at home; tylenol taken at 2pm with mild relief

## 2016-09-16 NOTE — ED Provider Notes (Signed)
[facility] Emergency Department Provider Note   First MD Initiated Contact with Patient 09/16/16 2304     (approximate)  I have reviewed the triage vital signs and the nursing notes.   HISTORY  Chief Complaint Headache and Dizziness    HPI Juan Watson is a 64 y.o. male history of hypertension diabetes presents to the emergency department with history of headache which is located bilateral temples accompanied by dizziness. Patient states that his blood pressure at home was 190/88 blood pressure at present 227/84. Patient denies any weakness no numbness gait instability or visual changes. She denies any chest pain or shortness of breath. Patient denies any change in urinary habits.   Past Medical History:  Diagnosis Date  . Arthritis   . BPH (benign prostatic hyperplasia)   . Diabetes mellitus without complication (HCC)   . GERD (gastroesophageal reflux disease)   . HOH (hard of hearing)    aids  . Hypertension     Patient Active Problem List   Diagnosis Date Noted  . Acquired flat foot 11/15/2015  . Primary osteoarthritis of knee 04/15/2015  . Arthritis of knee, degenerative 04/15/2015  . Primary osteoarthritis of right knee 04/14/2015  . Skin lesion 03/11/2015  . Burning or prickling sensation 10/29/2014  . Encounter for screening for malignant neoplasm of prostate 10/29/2014  . Familial aortic aneurysm 03/26/2014  . Gout 03/26/2014  . Family history of cardiovascular disease 03/26/2014  . Gastro-esophageal reflux disease without esophagitis 08/11/2013  . Generalized OA 08/11/2013  . Disease of nail 07/16/2011  . Benign hypertension 10/17/2010  . Essential (primary) hypertension 10/17/2010  . Hypercholesteremia 12/13/2002  . Diabetes mellitus, type 2 (HCC) 12/13/2002  . Hypercholesterolemia 12/13/2002  . Pure hypercholesterolemia 12/13/2002  . Controlled type 2 diabetes mellitus without complication (HCC) 12/13/2002  . Type 2 diabetes  mellitus (HCC) 12/13/2002    Past Surgical History:  Procedure Laterality Date  . BACK SURGERY  [phone]  . CARDIAC CATHETERIZATION  70's   pericarditis  . CATARACT EXTRACTION W/PHACO Right 03/23/2016   Procedure: CATARACT EXTRACTION PHACO AND INTRAOCULAR LENS PLACEMENT (IOC);  Surgeon: Sallee Lange, MD;  Location: [facility] ORS;  Service: Ophthalmology;  Laterality: Right;  [place] 01:31   . HERNIA REPAIR  11,   umbilical x2  . KNEE DEBRIDEMENT Left    tendon  . TONSILLECTOMY  72  . TOTAL KNEE ARTHROPLASTY Right 04/15/2015   Procedure: TOTAL KNEE ARTHROPLASTY;  Surgeon: Gean Birchwood, MD;  Location: [facility] OR;  Service: Orthopedics;  Laterality: Right;  . TRANSURETHRAL RESECTION OF PROSTATE  ?    Prior to Admission medications   Medication Sig Start Date End Date Taking? Authorizing Provider  aspirin EC 81 MG tablet Take 81 mg by mouth at bedtime.    Historical Provider, MD  cloNIDine (CATAPRES) 0.1 MG tablet Take 0.1 mg by mouth 2 (two) times daily.  02/13/15   Historical Provider, MD  diazepam (VALIUM) 10 MG tablet Take 1 tablet (10 mg total) by mouth once. Take 1 hour prior to CT scan for anxiety 05/19/16   Vanna Scotland, MD  gemfibrozil (LOPID) 600 MG tablet Take 600 mg by mouth 2 (two) times daily. 01/29/15   Historical Provider, MD  Boris Lown Oil 300 MG CAPS Take 600 mg by mouth daily.    Historical Provider, MD  labetalol (NORMODYNE) 200 MG tablet Take 200 mg by mouth 2 (two) times daily. 02/13/15   Historical Provider, MD  losartan (COZAAR) 100 MG tablet Take 100 mg  by mouth daily.    Historical Provider, MD  metFORMIN (GLUCOPHAGE) 500 MG tablet Take 250 mg by mouth 2 (two) times daily. 02/25/15   Historical Provider, MD  omeprazole (PRILOSEC) 20 MG capsule Take 20 mg by mouth daily.    Historical Provider, MD  vitamin E 400 UNIT capsule Take 400 Units by mouth daily. Reported on 03/23/2016    Historical Provider, MD    Allergies Bee venom; Statins; and Sulfa antibiotics  Family History    Problem Relation Age of Onset  . Nephrolithiasis Neg Hx   . Bladder Cancer Neg Hx   . Prostate cancer Neg Hx   . Kidney cancer Neg Hx     Social History Social History  Substance Use Topics  . Smoking status: Former Smoker    Packs/day: 2.00    Years: 25.00    Types: Cigarettes    Quit date: 04/04/1992  . Smokeless tobacco: Not on file  . Alcohol use No    Review of Systems Constitutional: No fever/chills Eyes: No visual changes. ENT: No sore throat. Cardiovascular: Denies chest pain. Respiratory: Denies shortness of breath. Gastrointestinal: No abdominal pain.  No nausea, no vomiting.  No diarrhea.  No constipation. Genitourinary: Negative for dysuria. Musculoskeletal: Negative for back pain. Skin: Negative for rash. Neurological: Positive for headaches Positive for dizziness  10-point ROS otherwise negative.  ____________________________________________   PHYSICAL EXAM:  VITAL SIGNS: ED Triage Vitals  Enc Vitals Group     BP 09/16/16 1907 (!) 207/76     Pulse Rate 09/16/16 1907 (!) 57     Resp 09/16/16 1907 18     Temp 09/16/16 1907 97.6 F (36.4 C)     Temp Source 09/16/16 1907 Oral     SpO2 09/16/16 1907 99 %     Weight 09/16/16 1908 230 lb (104.3 kg)     Height 09/16/16 1908 5\' 7"  (1.702 m)     Head Circumference --      Peak Flow --      Pain Score 09/16/16 1908 4     Pain Loc --      Pain Edu? --      Excl. in GC? --     Constitutional: Alert and oriented. Well appearing and in no acute distress. Eyes: Conjunctivae are normal. PERRL. EOMI. Head: Atraumatic. Ears:  Healthy appearing ear canals and TMs bilaterally Nose: No congestion/rhinnorhea. Mouth/Throat: Mucous membranes are moist.  Oropharynx non-erythematous. Neck: No stridor.  No meningeal signs.  Cardiovascular: Normal rate, regular rhythm. Good peripheral circulation. Grossly normal heart sounds. Respiratory: Normal respiratory effort.  No retractions. Lungs CTAB. Gastrointestinal:  Soft and nontender. No distention.  Musculoskeletal: No lower extremity tenderness nor edema. No gross deformities of extremities. Neurologic:  Normal speech and language. No gross focal neurologic deficits are appreciated.  Skin:  Skin is warm, dry and intact. No rash noted. Psychiatric: Mood and affect are normal. Speech and behavior are normal.  ____________________________________________   LABS (all labs ordered are listed, but only abnormal results are displayed)  Labs Reviewed  BASIC METABOLIC PANEL - Abnormal; Notable for the following:       Result Value   Glucose, Bld 121 (*)    BUN 25 (*)    Creatinine, Ser 1.32 (*)    GFR calc non Af Amer 55 (*)    All other components within normal limits  CBC  TROPONIN I   ____________________________________________  EKG  ED ECG REPORT I, [place] N Tessy Pawelski, the attending physician,  personally viewed and interpreted this ECG.   Date: 09/16/2016  EKG Time: 7:14 PM  Rate: 77  Rhythm: Sinus bradycardia  Axis: Normal  Intervals: Normal  ST&T Change: None  RADIOLOGY I, [place] N Loyalty Arentz, personally viewed and evaluated these images (plain radiographs) as part of my medical decision making, as well as reviewing the written report by the radiologist.  Mr Maxine Glenn Head Wo Contrast  Result Date: 09/17/2016 CLINICAL DATA:  Initial evaluation for acute headache, dizziness. Hypertension. EXAM: MRI HEAD WITHOUT CONTRAST MRA HEAD WITHOUT CONTRAST TECHNIQUE: Multiplanar, multiecho pulse sequences of the brain and surrounding structures were obtained without intravenous contrast. Angiographic images of the head were obtained using MRA technique without contrast. COMPARISON:  None. FINDINGS: MRI HEAD FINDINGS Brain: Study degraded by motion artifact. Generalized age-related cerebral atrophy present. No significant cerebral white matter disease for patient age. No abnormal foci of restricted diffusion to suggest acute or subacute ischemia. Gray-white  matter differentiation maintained. No evidence for acute or chronic intracranial hemorrhage. No areas of chronic infarction. No mass lesion, midline shift, or mass effect. No hydrocephalus. No extra-axial fluid collection. Major dural sinuses are grossly patent. Pituitary gland within normal limits. Vascular: Major intracranial vascular flow voids grossly maintained. Skull and upper cervical spine: Craniocervical junction normal. Visualized upper cervical spine unremarkable. Bone marrow signal intensity within normal limits. No scalp soft tissue abnormality. Sinuses/Orbits: Globes and orbital soft tissues within normal limits. Patient is status post lens extraction on the right. Paranasal sinuses are largely clear. No mastoid effusion. Inner ear structures within normal limits. MRA HEAD FINDINGS ANTERIOR CIRCULATION: Study degraded by motion artifact. Distal cervical segments of the internal carotid arteries are patent with antegrade flow. Petrous, cavernous, and supraclinoid segments patent without obvious stenosis. Cavernous left ICA somewhat tortuous. A1 segments grossly patent. Anterior communicating artery grossly unremarkable. Proximal A2 segments not well evaluated due to motion. Anterior cerebral arch are well opacified distally. M1 segments grossly patent without obvious stenosis, although evaluation limited by motion. MCA branches well perfused distally. POSTERIOR CIRCULATION: Vertebral arteries patent to the vertebrobasilar junction. Left vertebral artery dominant. No obvious stenosis within the vertebral arteries, although evaluation limited by motion. Basilar artery tortuous proximally but patent to its distal aspect. Superior cerebral arteries grossly patent proximally posterior cerebral arteries both arise from the basilar artery and are grossly patent proximally, not well evaluated distally on this motion degraded study. IMPRESSION: 1. Negative brain MRI.  No acute intracranial process identified. 2.  Motion degraded intracranial MRA. No large vessel occlusion. No obvious high-grade or correctable stenosis. Electronically Signed   By: Rise Mu M.D.   On: 09/17/2016 01:36   Mr Brain Wo Contrast  Result Date: 09/17/2016 CLINICAL DATA:  Initial evaluation for acute headache, dizziness. Hypertension. EXAM: MRI HEAD WITHOUT CONTRAST MRA HEAD WITHOUT CONTRAST TECHNIQUE: Multiplanar, multiecho pulse sequences of the brain and surrounding structures were obtained without intravenous contrast. Angiographic images of the head were obtained using MRA technique without contrast. COMPARISON:  None. FINDINGS: MRI HEAD FINDINGS Brain: Study degraded by motion artifact. Generalized age-related cerebral atrophy present. No significant cerebral white matter disease for patient age. No abnormal foci of restricted diffusion to suggest acute or subacute ischemia. Gray-white matter differentiation maintained. No evidence for acute or chronic intracranial hemorrhage. No areas of chronic infarction. No mass lesion, midline shift, or mass effect. No hydrocephalus. No extra-axial fluid collection. Major dural sinuses are grossly patent. Pituitary gland within normal limits. Vascular: Major intracranial vascular flow voids grossly maintained. Skull and upper  cervical spine: Craniocervical junction normal. Visualized upper cervical spine unremarkable. Bone marrow signal intensity within normal limits. No scalp soft tissue abnormality. Sinuses/Orbits: Globes and orbital soft tissues within normal limits. Patient is status post lens extraction on the right. Paranasal sinuses are largely clear. No mastoid effusion. Inner ear structures within normal limits. MRA HEAD FINDINGS ANTERIOR CIRCULATION: Study degraded by motion artifact. Distal cervical segments of the internal carotid arteries are patent with antegrade flow. Petrous, cavernous, and supraclinoid segments patent without obvious stenosis. Cavernous left ICA somewhat  tortuous. A1 segments grossly patent. Anterior communicating artery grossly unremarkable. Proximal A2 segments not well evaluated due to motion. Anterior cerebral arch are well opacified distally. M1 segments grossly patent without obvious stenosis, although evaluation limited by motion. MCA branches well perfused distally. POSTERIOR CIRCULATION: Vertebral arteries patent to the vertebrobasilar junction. Left vertebral artery dominant. No obvious stenosis within the vertebral arteries, although evaluation limited by motion. Basilar artery tortuous proximally but patent to its distal aspect. Superior cerebral arteries grossly patent proximally posterior cerebral arteries both arise from the basilar artery and are grossly patent proximally, not well evaluated distally on this motion degraded study. IMPRESSION: 1. Negative brain MRI.  No acute intracranial process identified. 2. Motion degraded intracranial MRA. No large vessel occlusion. No obvious high-grade or correctable stenosis. Electronically Signed   By: Rise MuBenjamin  McClintock M.D.   On: 09/17/2016 01:36   Dg Chest Portable 1 View  Result Date: 09/17/2016 CLINICAL DATA:  Initial evaluation for acute weakness. History of hypertension. EXAM: PORTABLE CHEST 1 VIEW COMPARISON:  None. FINDINGS: Mild cardiomegaly.  Mediastinal silhouette within normal limits. Lungs hypoinflated. Mild bibasilar bronchovascular crowding. No focal infiltrates. No pulmonary edema or pleural effusion. No pneumothorax. No acute osseous abnormality. IMPRESSION: 1. Shallow lung inflation with mild bibasilar bronchovascular crowding. No other active cardiopulmonary disease. 2. Mild cardiomegaly. Electronically Signed   By: Rise MuBenjamin  McClintock M.D.   On: 09/17/2016 01:55     Procedures    INITIAL IMPRESSION / ASSESSMENT AND PLAN / ED COURSE  Pertinent labs & imaging results that were available during my care of the patient were reviewed by me and considered in my medical decision  making (see chart for details).  Patient given IV hydralazine 5 mg marked improvement in blood pressure. Patient given an additional dose of clonidine 0.1 mg before discharge. Giving concern for possible intracranial abnormality MRI of the brain was performed which was negative. Patient's headache resolved dizziness resolved before discharge from the emergency department   Clinical Course    ____________________________________________  FINAL CLINICAL IMPRESSION(S) / ED DIAGNOSES  Final diagnoses:  Hypertension, unspecified type     MEDICATIONS GIVEN DURING THIS VISIT:  Medications  hydrALAZINE (APRESOLINE) injection 5 mg (5 mg Intravenous Given 09/16/16 2353)  LORazepam (ATIVAN) injection 1 mg (1 mg Intravenous Given 09/16/16 2354)  cloNIDine (CATAPRES) tablet 0.1 mg (0.1 mg Oral Given 09/17/16 0208)     NEW OUTPATIENT MEDICATIONS STARTED DURING THIS VISIT:  Discharge Medication List as of 09/17/2016  3:06 AM      Discharge Medication List as of 09/17/2016  3:06 AM      Discharge Medication List as of 09/17/2016  3:06 AM       Note:  This document was prepared using Dragon voice recognition software and may include unintentional dictation errors.    Darci Currentandolph N Laela Deviney, MD 09/17/16 [phone]

## 2016-09-16 NOTE — ED Notes (Signed)
MD at bedside. 

## 2016-09-17 ENCOUNTER — Emergency Department: Payer: Medicare Other

## 2016-09-17 DIAGNOSIS — R001 Bradycardia, unspecified: Secondary | ICD-10-CM | POA: Insufficient documentation

## 2016-09-17 MED ORDER — CLONIDINE HCL 0.1 MG PO TABS
0.1000 mg | ORAL_TABLET | Freq: Once | ORAL | Status: AC
Start: 2016-09-17 — End: 2016-09-17
  Administered 2016-09-17: 0.1 mg via ORAL
  Filled 2016-09-17: qty 1

## 2016-09-17 NOTE — ED Notes (Signed)
Pt returned from MRI °

## 2016-09-17 NOTE — ED Notes (Signed)
Patient transported to MRI via stretcher.

## 2016-10-01 DIAGNOSIS — I161 Hypertensive emergency: Secondary | ICD-10-CM | POA: Insufficient documentation

## 2016-11-04 DIAGNOSIS — I358 Other nonrheumatic aortic valve disorders: Secondary | ICD-10-CM | POA: Insufficient documentation

## 2016-11-04 HISTORY — DX: Other nonrheumatic aortic valve disorders: I35.8

## 2017-02-22 DIAGNOSIS — Z1211 Encounter for screening for malignant neoplasm of colon: Secondary | ICD-10-CM | POA: Insufficient documentation

## 2017-06-28 ENCOUNTER — Encounter: Payer: Self-pay | Admitting: Radiology

## 2017-06-28 ENCOUNTER — Inpatient Hospital Stay
Admission: EM | Admit: 2017-06-28 | Discharge: 2017-07-01 | DRG: 419 | Disposition: A | Discharge status: Home / Self Care | Payer: Medicare Other | Attending: Surgery | Admitting: Surgery

## 2017-06-28 ENCOUNTER — Emergency Department: Payer: Medicare Other

## 2017-06-28 DIAGNOSIS — K219 Gastro-esophageal reflux disease without esophagitis: Secondary | ICD-10-CM | POA: Diagnosis present

## 2017-06-28 DIAGNOSIS — Z7982 Long term (current) use of aspirin: Secondary | ICD-10-CM | POA: Diagnosis not present

## 2017-06-28 DIAGNOSIS — Z9841 Cataract extraction status, right eye: Secondary | ICD-10-CM

## 2017-06-28 DIAGNOSIS — H9190 Unspecified hearing loss, unspecified ear: Secondary | ICD-10-CM | POA: Diagnosis present

## 2017-06-28 DIAGNOSIS — K819 Cholecystitis, unspecified: Secondary | ICD-10-CM

## 2017-06-28 DIAGNOSIS — Z7984 Long term (current) use of oral hypoglycemic drugs: Secondary | ICD-10-CM

## 2017-06-28 DIAGNOSIS — N400 Enlarged prostate without lower urinary tract symptoms: Secondary | ICD-10-CM | POA: Diagnosis present

## 2017-06-28 DIAGNOSIS — Z961 Presence of intraocular lens: Secondary | ICD-10-CM | POA: Diagnosis present

## 2017-06-28 DIAGNOSIS — M1990 Unspecified osteoarthritis, unspecified site: Secondary | ICD-10-CM | POA: Diagnosis present

## 2017-06-28 DIAGNOSIS — Z91030 Bee allergy status: Secondary | ICD-10-CM | POA: Diagnosis not present

## 2017-06-28 DIAGNOSIS — K8000 Calculus of gallbladder with acute cholecystitis without obstruction: Secondary | ICD-10-CM | POA: Diagnosis present

## 2017-06-28 DIAGNOSIS — Z882 Allergy status to sulfonamides status: Secondary | ICD-10-CM

## 2017-06-28 DIAGNOSIS — Z87891 Personal history of nicotine dependence: Secondary | ICD-10-CM | POA: Diagnosis not present

## 2017-06-28 DIAGNOSIS — I10 Essential (primary) hypertension: Secondary | ICD-10-CM | POA: Diagnosis present

## 2017-06-28 DIAGNOSIS — R1013 Epigastric pain: Secondary | ICD-10-CM

## 2017-06-28 DIAGNOSIS — Z96651 Presence of right artificial knee joint: Secondary | ICD-10-CM | POA: Diagnosis present

## 2017-06-28 DIAGNOSIS — E119 Type 2 diabetes mellitus without complications: Secondary | ICD-10-CM | POA: Diagnosis present

## 2017-06-28 DIAGNOSIS — K8020 Calculus of gallbladder without cholecystitis without obstruction: Secondary | ICD-10-CM

## 2017-06-28 DIAGNOSIS — Z888 Allergy status to other drugs, medicaments and biological substances status: Secondary | ICD-10-CM

## 2017-06-28 HISTORY — DX: Cholecystitis, unspecified: K81.9

## 2017-06-28 LAB — COMPREHENSIVE METABOLIC PANEL
ALT: 20 U/L (ref 17–63)
AST: 24 U/L (ref 15–41)
Albumin: 5.3 g/dL — ABNORMAL HIGH (ref 3.5–5.0)
Alkaline Phosphatase: 45 U/L (ref 38–126)
Anion gap: 11 (ref 5–15)
BILIRUBIN TOTAL: 0.7 mg/dL (ref 0.3–1.2)
BUN: 21 mg/dL — ABNORMAL HIGH (ref 6–20)
CHLORIDE: 101 mmol/L (ref 101–111)
CO2: 27 mmol/L (ref 22–32)
Calcium: 10.3 mg/dL (ref 8.9–10.3)
Creatinine, Ser: 1.12 mg/dL (ref 0.61–1.24)
Glucose, Bld: 125 mg/dL — ABNORMAL HIGH (ref 65–99)
Potassium: 4.2 mmol/L (ref 3.5–5.1)
Sodium: 139 mmol/L (ref 135–145)
Total Protein: 8.3 g/dL — ABNORMAL HIGH (ref 6.5–8.1)

## 2017-06-28 LAB — CBC WITH DIFFERENTIAL/PLATELET
BASOS ABS: 0.1 10*3/uL (ref 0–0.1)
Basophils Relative: 1 %
EOS PCT: 4 %
Eosinophils Absolute: 0.2 10*3/uL (ref 0–0.7)
HEMATOCRIT: 42.8 % (ref 40.0–52.0)
Hemoglobin: 15.1 g/dL (ref 13.0–18.0)
LYMPHS ABS: 1.7 10*3/uL (ref 1.0–3.6)
LYMPHS PCT: 27 %
MCH: 28.6 pg (ref 26.0–34.0)
MCHC: 35.3 g/dL (ref 32.0–36.0)
MCV: 81.1 fL (ref 80.0–100.0)
MONO ABS: 0.6 10*3/uL (ref 0.2–1.0)
Monocytes Relative: 10 %
NEUTROS ABS: 3.7 10*3/uL (ref 1.4–6.5)
Neutrophils Relative %: 58 %
Platelets: 260 10*3/uL (ref 150–440)
RBC: 5.28 MIL/uL (ref 4.40–5.90)
RDW: 13 % (ref 11.5–14.5)
WBC: 6.4 10*3/uL (ref 3.8–10.6)

## 2017-06-28 LAB — GLUCOSE, CAPILLARY
GLUCOSE-CAPILLARY: 158 mg/dL — AB (ref 65–99)
Glucose-Capillary: 175 mg/dL — ABNORMAL HIGH (ref 65–99)

## 2017-06-28 LAB — LIPASE, BLOOD: LIPASE: 49 U/L (ref 11–51)

## 2017-06-28 LAB — LACTIC ACID, PLASMA: Lactic Acid, Venous: 1.4 mmol/L (ref 0.5–1.9)

## 2017-06-28 LAB — TYPE AND SCREEN
ABO/RH(D): A POS
Antibody Screen: NEGATIVE

## 2017-06-28 LAB — TROPONIN I

## 2017-06-28 MED ORDER — HYDROMORPHONE HCL 1 MG/ML IJ SOLN
1.0000 mg | INTRAMUSCULAR | Status: AC
  Administered 2017-06-28: 1 mg via INTRAVENOUS
  Filled 2017-06-28: qty 1

## 2017-06-28 MED ORDER — METOPROLOL TARTRATE 5 MG/5ML IV SOLN: 5.0000 mg | Freq: Four times a day (QID) | INTRAVENOUS | Status: DC | PRN

## 2017-06-28 MED ORDER — DIPHENHYDRAMINE HCL 50 MG/ML IJ SOLN: 12.5000 mg | Freq: Four times a day (QID) | INTRAMUSCULAR | Status: DC | PRN

## 2017-06-28 MED ORDER — ENOXAPARIN SODIUM 40 MG/0.4ML ~~LOC~~ SOLN
40.0000 mg | SUBCUTANEOUS | Status: DC
  Administered 2017-06-28 – 2017-06-30 (×3): 40 mg via SUBCUTANEOUS
  Filled 2017-06-28 (×3): qty 0.4

## 2017-06-28 MED ORDER — HYDRALAZINE HCL 20 MG/ML IJ SOLN
10.0000 mg | Freq: Once | INTRAMUSCULAR | Status: AC
  Administered 2017-06-28: 10 mg via INTRAVENOUS
  Filled 2017-06-28: qty 1

## 2017-06-28 MED ORDER — PIPERACILLIN-TAZOBACTAM 3.375 G IVPB
3.3750 g | Freq: Three times a day (TID) | INTRAVENOUS | Status: DC
  Administered 2017-06-28 – 2017-07-01 (×8): 3.375 g via INTRAVENOUS
  Filled 2017-06-28 (×8): qty 50

## 2017-06-28 MED ORDER — INSULIN ASPART 100 UNIT/ML ~~LOC~~ SOLN
0.0000 [IU] | SUBCUTANEOUS | Status: DC
  Administered 2017-06-28 (×2): 3 [IU] via SUBCUTANEOUS
  Administered 2017-06-29 (×3): 2 [IU] via SUBCUTANEOUS
  Administered 2017-06-29 – 2017-06-30 (×5): 3 [IU] via SUBCUTANEOUS
  Filled 2017-06-28 (×10): qty 1

## 2017-06-28 MED ORDER — PANTOPRAZOLE SODIUM 40 MG IV SOLR
40.0000 mg | Freq: Every day | INTRAVENOUS | Status: DC
  Administered 2017-06-28 – 2017-06-30 (×3): 40 mg via INTRAVENOUS
  Filled 2017-06-28 (×3): qty 40

## 2017-06-28 MED ORDER — HYDROMORPHONE HCL 1 MG/ML IJ SOLN
1.0000 mg | Freq: Once | INTRAMUSCULAR | Status: AC
  Administered 2017-06-28: 1 mg via INTRAVENOUS

## 2017-06-28 MED ORDER — DIPHENHYDRAMINE HCL 12.5 MG/5ML PO ELIX: 12.5000 mg | ORAL_SOLUTION | Freq: Four times a day (QID) | ORAL | Status: DC | PRN

## 2017-06-28 MED ORDER — LOSARTAN POTASSIUM 50 MG PO TABS
100.0000 mg | ORAL_TABLET | Freq: Every day | ORAL | Status: DC
  Administered 2017-06-30: 100 mg via ORAL
  Filled 2017-06-28 (×2): qty 2

## 2017-06-28 MED ORDER — LABETALOL HCL 200 MG PO TABS
200.0000 mg | ORAL_TABLET | Freq: Two times a day (BID) | ORAL | Status: DC
  Administered 2017-06-28 – 2017-06-30 (×4): 200 mg via ORAL
  Filled 2017-06-28 (×6): qty 1

## 2017-06-28 MED ORDER — ONDANSETRON 4 MG PO TBDP: 4.0000 mg | ORAL_TABLET | Freq: Four times a day (QID) | ORAL | Status: DC | PRN

## 2017-06-28 MED ORDER — IOPAMIDOL (ISOVUE-370) INJECTION 76%
125.0000 mL | Freq: Once | INTRAVENOUS | Status: AC | PRN
  Administered 2017-06-28: 125 mL via INTRAVENOUS

## 2017-06-28 MED ORDER — PIPERACILLIN-TAZOBACTAM 3.375 G IVPB 30 MIN: 3.3750 g | Freq: Once | INTRAVENOUS | Status: DC

## 2017-06-28 MED ORDER — HYDROMORPHONE HCL 1 MG/ML IJ SOLN
INTRAMUSCULAR | Status: AC
  Filled 2017-06-28: qty 1

## 2017-06-28 MED ORDER — HYDRALAZINE HCL 20 MG/ML IJ SOLN: 10.0000 mg | INTRAMUSCULAR | Status: DC | PRN

## 2017-06-28 MED ORDER — FENTANYL CITRATE (PF) 100 MCG/2ML IJ SOLN
50.0000 ug | Freq: Once | INTRAMUSCULAR | Status: AC
  Administered 2017-06-28: 50 ug via INTRAVENOUS
  Filled 2017-06-28: qty 2

## 2017-06-28 MED ORDER — CLONIDINE HCL 0.1 MG PO TABS
0.1000 mg | ORAL_TABLET | Freq: Two times a day (BID) | ORAL | Status: DC
  Administered 2017-06-28 – 2017-06-30 (×4): 0.1 mg via ORAL
  Filled 2017-06-28 (×5): qty 1

## 2017-06-28 MED ORDER — MORPHINE SULFATE (PF) 4 MG/ML IV SOLN
4.0000 mg | Freq: Once | INTRAVENOUS | Status: AC
Start: 2017-06-28 — End: 2017-06-28
  Administered 2017-06-28: 4 mg via INTRAVENOUS
  Filled 2017-06-28: qty 1

## 2017-06-28 MED ORDER — DEXTROSE IN LACTATED RINGERS 5 % IV SOLN
INTRAVENOUS | Status: DC
  Administered 2017-06-28 – 2017-06-30 (×5): via INTRAVENOUS

## 2017-06-28 MED ORDER — HYDROMORPHONE HCL 1 MG/ML IJ SOLN
1.0000 mg | INTRAMUSCULAR | Status: DC | PRN
  Administered 2017-06-28 – 2017-06-30 (×6): 1 mg via INTRAVENOUS
  Filled 2017-06-28 (×6): qty 1

## 2017-06-28 MED ORDER — SODIUM CHLORIDE 0.9 % IV BOLUS (SEPSIS)
1000.0000 mL | Freq: Once | INTRAVENOUS | Status: AC
  Administered 2017-06-28: 1000 mL via INTRAVENOUS

## 2017-06-28 MED ORDER — ONDANSETRON HCL 4 MG/2ML IJ SOLN
4.0000 mg | Freq: Four times a day (QID) | INTRAMUSCULAR | Status: DC | PRN
  Administered 2017-06-30: 4 mg via INTRAVENOUS
  Filled 2017-06-28: qty 2

## 2017-06-28 MED ORDER — ONDANSETRON HCL 4 MG/2ML IJ SOLN
4.0000 mg | Freq: Once | INTRAMUSCULAR | Status: AC
  Administered 2017-06-28: 4 mg via INTRAVENOUS
  Filled 2017-06-28: qty 2

## 2017-06-28 NOTE — ED Notes (Signed)
Patient is more calm at this time, no longer as restless on the stretcher. Wife at bedside.

## 2017-06-28 NOTE — ED Provider Notes (Signed)
[facility] Emergency Department Provider Note  ____________________________________________  Time seen: Approximately 4:38 PM  I have reviewed the triage vital signs and the nursing notes.   HISTORY  Chief Complaint Abdominal Pain   HPI Juan Watson is a 66 y.o. male with a history of diabetes diet controlled, hypertension, BPHwho presents for evaluation of abdominal pain. Patient reports that he had just eaten some watermelon and was sitting on the couch when he developed sudden onset of dull epigastric pain that is severe, constant and nonradiating 30 minutes prior to arrival. Patient reports one similar episode however not as severe several years ago in the setting of idiopathic pancreatitis. Patient has been nauseated and diaphoretic, no vomiting, no diarrhea or constipation, no fever or chills, no chest pain or shortness of breath. He does not drink alcohol.   Past Medical History:  Diagnosis Date  . Arthritis   . BPH (benign prostatic hyperplasia)   . Diabetes mellitus without complication (HCC)   . GERD (gastroesophageal reflux disease)   . HOH (hard of hearing)    aids  . Hypertension     Patient Active Problem List   Diagnosis Date Noted  . Cholecystitis 06/28/2017  . Acquired flat foot 11/15/2015  . Primary osteoarthritis of knee 04/15/2015  . Arthritis of knee, degenerative 04/15/2015  . Primary osteoarthritis of right knee 04/14/2015  . Skin lesion 03/11/2015  . Burning or prickling sensation 10/29/2014  . Encounter for screening for malignant neoplasm of prostate 10/29/2014  . Familial aortic aneurysm 03/26/2014  . Gout 03/26/2014  . Family history of cardiovascular disease 03/26/2014  . Gastro-esophageal reflux disease without esophagitis 08/11/2013  . Generalized OA 08/11/2013  . Disease of nail 07/16/2011  . Benign hypertension 10/17/2010  . Essential (primary) hypertension 10/17/2010  . Hypercholesteremia 12/13/2002  .  Diabetes mellitus, type 2 (HCC) 12/13/2002  . Hypercholesterolemia 12/13/2002  . Pure hypercholesterolemia 12/13/2002  . Controlled type 2 diabetes mellitus without complication (HCC) 12/13/2002  . Type 2 diabetes mellitus (HCC) 12/13/2002    Past Surgical History:  Procedure Laterality Date  . BACK SURGERY  [phone] [id number]  . CARDIAC CATHETERIZATION  70's   pericarditis  . CATARACT EXTRACTION W/PHACO Right 03/23/2016   Procedure: CATARACT EXTRACTION PHACO AND INTRAOCULAR LENS PLACEMENT (IOC);  Surgeon: Sallee LangeSteven Dingeldein, MD;  Location: [facility] ORS;  Service: Ophthalmology;  Laterality: Right;  US 01:31   . HERNIA REPAIR  11,   umbilical x2  . KNEE DEBRIDEMENT Left    tendon  . TONSILLECTOMY  72  . TOTAL KNEE ARTHROPLASTY Right 04/15/2015   Procedure: TOTAL KNEE ARTHROPLASTY;  Surgeon: Gean BirchwoodFrank Rowan, MD;  Location: [facility];  Service: Orthopedics;  Laterality: Right;  . TRANSURETHRAL RESECTION OF PROSTATE  ?    Prior to Admission medications   Medication Sig Start Date End Date Taking? Authorizing Provider  aspirin EC 81 MG tablet Take 81 mg by mouth at bedtime.   Yes [provider]  cloNIDine (CATAPRES) 0.1 MG tablet Take 0.1 mg by mouth 2 (two) times daily.  02/13/15  Yes [provider]  gemfibrozil (LOPID) 600 MG tablet Take 600 mg by mouth 2 (two) times daily. 01/29/15  Yes [provider]  Boris LownKrill Oil 300 MG CAPS Take 600 mg by mouth daily.   Yes [provider]  labetalol (NORMODYNE) 200 MG tablet Take 200 mg by mouth 2 (two) times daily. 02/13/15  Yes [provider]  losartan (COZAAR) 100 MG tablet Take 100 mg by mouth  daily.   Yes [provider]  metFORMIN (GLUCOPHAGE) 500 MG tablet Take 500 mg by mouth 2 (two) times daily.  02/25/15  Yes [provider]  omeprazole (PRILOSEC) 20 MG capsule Take 20 mg by mouth daily.   Yes [provider]  vitamin E 400 UNIT capsule Take 400 Units by mouth daily. Reported on 03/23/2016   Yes  [provider]  diazepam (VALIUM) 10 MG tablet Take 1 tablet (10 mg total) by mouth once. Take 1 hour prior to CT scan for anxiety Patient not taking: Reported on 06/28/2017 05/19/16   Vanna Scotland, MD    Allergies Bee venom; Statins; and Sulfa antibiotics  Family History  Problem Relation Age of Onset  . Nephrolithiasis Neg Hx   . Bladder Cancer Neg Hx   . Prostate cancer Neg Hx   . Kidney cancer Neg Hx     Social History Social History  Substance Use Topics  . Smoking status: Former Smoker    Packs/day: 2.00    Years: 25.00    Types: Cigarettes    Quit date: 04/04/1992  . Smokeless tobacco: Never Used  . Alcohol use No    Review of Systems  Constitutional: Negative for fever. Eyes: Negative for visual changes. ENT: Negative for sore throat. Neck: No neck pain  Cardiovascular: Negative for chest pain. Respiratory: Negative for shortness of breath. Gastrointestinal: + epigastric abdominal pain and nausea. No vomiting or diarrhea. Genitourinary: Negative for dysuria. Musculoskeletal: Negative for back pain. Skin: Negative for rash. Neurological: Negative for headaches, weakness or numbness. Psych: No SI or HI  ____________________________________________   PHYSICAL EXAM:  VITAL SIGNS: ED Triage Vitals  Enc Vitals Group     BP 06/28/17 1530 (!) 253/102     Pulse Rate 06/28/17 1530 67     Resp 06/28/17 1530 (!) 24     Temp --      Temp src --      SpO2 06/28/17 1601 99 %     Weight --      Height --      Head Circumference --      Peak Flow --      Pain Score 06/28/17 1544 0     Pain Loc --      Pain Edu? --      Excl. in GC? --     Constitutional: Alert and oriented, patient is in severe distress, grunting, holding his abdomen, looks diaphoretic. HEENT:      Head: Normocephalic and atraumatic.         Eyes: Conjunctivae are normal. Sclera is non-icteric.       Mouth/Throat: Mucous membranes are moist.       Neck: Supple with no signs of  meningismus. Cardiovascular: Regular rate and rhythm. No murmurs, gallops, or rubs. 2+ symmetrical distal pulses are present in all extremities. No JVD. Respiratory: Normal respiratory effort. Lungs are clear to auscultation bilaterally. No wheezes, crackles, or rhonchi.  Gastrointestinal: Obese, ttp over the epigastric region, and non distended with positive bowel sounds. No rebound or guarding. Genitourinary: No CVA tenderness. Musculoskeletal: Nontender with normal range of motion in all extremities. No edema, cyanosis, or erythema of extremities. Neurologic: Normal speech and language. Face is symmetric. Moving all extremities. No gross focal neurologic deficits are appreciated. Skin: Skin is warm, dry and intact. No rash noted. Psychiatric: Mood and affect are normal. Speech and behavior are normal.  ____________________________________________   LABS (all labs ordered are listed, but only abnormal  results are displayed)  Labs Reviewed  COMPREHENSIVE METABOLIC PANEL - Abnormal; Notable for the following:       Result Value   Glucose, Bld 125 (*)    BUN 21 (*)    Total Protein 8.3 (*)    Albumin 5.3 (*)    All other components within normal limits  CBC WITH DIFFERENTIAL/PLATELET  LIPASE, BLOOD  TROPONIN I  LACTIC ACID, PLASMA  CBC  COMPREHENSIVE METABOLIC PANEL  TYPE AND SCREEN   ____________________________________________  EKG  ED ECG REPORT I, Nita Sicklearolina Martin Belling, the attending physician, personally viewed and interpreted this ECG.  15:23 - normal sinus rhythm, rate of 60, normal intervals, normal axis, no ST elevations or depressions. Unchanged from prior from October 2017  15:39 - normal sinus rhythm, rate of 87, first-degree AV block, normal QRS and QTc intervals, normal axis, no significant changes when compared to prior. ____________________________________________  RADIOLOGY  CTA c/a/p: No evidence of thoracoabdominal aortic aneurysm or dissection. No evidence  of pulmonary embolism. Cholelithiasis, without associated inflammatory changes. Additional ancillary findings as above.   RUQ US: 1. Cholelithiasis without evidence of cholecystitis. 2. No biliary duct dilatation ____________________________________________   PROCEDURES  Procedure(s) performed: None Procedures Critical Care performed:  None ____________________________________________   INITIAL IMPRESSION / ASSESSMENT AND PLAN / ED COURSE   66 y.o. male with a history of diabetes diet controlled, hypertension, BPHwho presents for evaluation of sudden onset severe epigastric abdominal pain associated with nausea. Patient is in severe distress, grunting and holding his abdomen, looks diaphoretic. 2 EKGs were done within 15 minutes of each other with no evidence of STEMI. Patient severely hypertensive with concerns for dissection versus AAA rupture versus mesenteric ischemia. Bedside ultrasound unable to visualize the aorta with no intra-abdominal fluid. Patient was taken stat for CT angiogram of chest abdomen pelvis which has shown no etiology for patient's pain. Blood work including CBC, CMP, lipase, troponin, and lactate are all within normal limits. Gallstones seen within the gallbladder therefore we'll send patient for right upper quadrant ultrasound as patient continues to have severe pain after 2 mg of IV Dilaudid   ED COURSE:  CTA chest abdomen pelvis only showed cholelithiasis. Right upper quadrant ultrasound showing cholelithiasis with no evidence of cholecystitis. Patient remained in significant pain throughout his stay in the emergency department required several rounds of narcotics. Surgery was consult and patient was evaluated by Dr. Aleen CampiPiscoya for symptomatic cholelithiasis. Patient was started on Zosyn per his recommendation. Patient can be admitted for monitoring and possibly HIDA scan. Patient's lab work was within normal limits with no acute findings.  Pertinent labs & imaging  results that were available during my care of the patient were reviewed by me and considered in my medical decision making (see chart for details).    ____________________________________________   FINAL CLINICAL IMPRESSION(S) / ED DIAGNOSES  Final diagnoses:  Epigastric abdominal pain  Symptomatic cholelithiasis      NEW MEDICATIONS STARTED DURING THIS VISIT:  Current Discharge Medication List       Note:  This document was prepared using Dragon voice recognition software and may include unintentional dictation errors.    Don PerkingVeronese, [place], MD 06/28/17 2100

## 2017-06-28 NOTE — ED Notes (Signed)
Patient placed on 2L O2 via [place] due to narcotic administration.

## 2017-06-28 NOTE — ED Notes (Signed)
Patient is restless on the stretcher, rolling back and forth, praying and stating, "Jesus, help me." Wife is at bedside.

## 2017-06-28 NOTE — H&P (Signed)
Date of Admission:  06/28/2017  Reason for Admission:  Acute cholecystitis  History of Present Illness: Marylee FlorasClyde D Reimers is a 66 y.o. male who presents with a three day history of abdominal pain that worsened today.  He reports that on 7/28 he started having "chest pain" that is pointed at the epigastric area, was intermittent in nature and not as severe.  Then this afternoon, after eating watermelon, started having severe epigastric pain that has not subsided despite of multiple doses of pain medication in the ED.  He reports dry heaving but no vomiting and had normal bowel movement today.  Denies any fevers at home but has been sweating in the ED.  Denies the pain radiating anywhere.  Denies any true chest pain, shortness of breath.  In the ED, CTA chest/abdomen/pelvis scan was overall normal, and ultrasound showed cholelithiasis without evidence of cholecystitis.  His labs have been normal with WBC 6.4 and normal LFTs.  Past Medical History: Past Medical History:  Diagnosis Date  . Arthritis   . BPH (benign prostatic hyperplasia)   . Diabetes mellitus without complication (HCC)   . GERD (gastroesophageal reflux disease)   . HOH (hard of hearing)    aids  . Hypertension      Past Surgical History: Past Surgical History:  Procedure Laterality Date  . BACK SURGERY  [phone]  . CARDIAC CATHETERIZATION  70's   pericarditis  . CATARACT EXTRACTION W/PHACO Right 03/23/2016   Procedure: CATARACT EXTRACTION PHACO AND INTRAOCULAR LENS PLACEMENT (IOC);  Surgeon: Sallee LangeSteven Dingeldein, MD;  Location: [facility] ORS;  Service: Ophthalmology;  Laterality: Right;  US 01:31   . HERNIA REPAIR  11,   umbilical x2  . KNEE DEBRIDEMENT Left    tendon  . TONSILLECTOMY  72  . TOTAL KNEE ARTHROPLASTY Right 04/15/2015   Procedure: TOTAL KNEE ARTHROPLASTY;  Surgeon: Gean BirchwoodFrank Rowan, MD;  Location: [facility] OR;  Service: Orthopedics;  Laterality: Right;  . TRANSURETHRAL RESECTION OF PROSTATE  ?    Home Medications: Prior to  Admission medications   Medication Sig Start Date End Date Taking? Authorizing Provider  aspirin EC 81 MG tablet Take 81 mg by mouth at bedtime.   Yes [provider]  cloNIDine (CATAPRES) 0.1 MG tablet Take 0.1 mg by mouth 2 (two) times daily.  02/13/15  Yes [provider]  gemfibrozil (LOPID) 600 MG tablet Take 600 mg by mouth 2 (two) times daily. 01/29/15  Yes [provider]  Boris LownKrill Oil 300 MG CAPS Take 600 mg by mouth daily.   Yes [provider]  labetalol (NORMODYNE) 200 MG tablet Take 200 mg by mouth 2 (two) times daily. 02/13/15  Yes [provider]  losartan (COZAAR) 100 MG tablet Take 100 mg by mouth daily.   Yes [provider]  metFORMIN (GLUCOPHAGE) 500 MG tablet Take 500 mg by mouth 2 (two) times daily.  02/25/15  Yes [provider]  omeprazole (PRILOSEC) 20 MG capsule Take 20 mg by mouth daily.   Yes [provider]  vitamin E 400 UNIT capsule Take 400 Units by mouth daily. Reported on 03/23/2016   Yes [provider]  diazepam (VALIUM) 10 MG tablet Take 1 tablet (10 mg total) by mouth once. Take 1 hour prior to CT scan for anxiety Patient not taking: Reported on 06/28/2017 05/19/16   Vanna ScotlandBrandon, Ashley, MD    Allergies: Allergies  Allergen Reactions  . Bee Venom Swelling    Sob, swelling  . Statins  Other reaction(s): Other (See Comments) SEVERE MYALGIAS  . Sulfa Antibiotics Swelling    Tongue swelling    Social History:  reports that he quit smoking about 25 years ago. His smoking use included Cigarettes. He has a 50.00 pack-year smoking history. He does not have any smokeless tobacco history on file. He reports that he does not drink alcohol or use drugs.   Family History: Family History  Problem Relation Age of Onset  . Nephrolithiasis Neg Hx   . Bladder Cancer Neg Hx   . Prostate cancer Neg Hx   . Kidney cancer Neg Hx     Review of Systems: Review of Systems  Constitutional:  Positive for chills. Negative for fever.  HENT: Negative for hearing loss.   Eyes: Negative for blurred vision.  Respiratory: Negative for shortness of breath.   Cardiovascular: Negative for chest pain.  Gastrointestinal: Positive for abdominal pain. Negative for constipation, nausea and vomiting.  Genitourinary: Negative for dysuria.  Musculoskeletal: Negative for myalgias.  Skin: Negative for rash.  Neurological: Negative for dizziness.  Psychiatric/Behavioral: Negative for depression.  All other systems reviewed and are negative.   Physical Exam BP (!) 130/93 (BP Location: Right Arm)   Pulse 81   Resp 16   SpO2 96%  CONSTITUTIONAL: In pain distress and uncomfortable HEENT:  Normocephalic, atraumatic, extraocular motion intact. NECK: Trachea is midline, and there is no jugular venous distension.  RESPIRATORY:  Lungs are clear, and breath sounds are equal bilaterally. Normal respiratory effort without pathologic use of accessory muscles. CARDIOVASCULAR: Heart is regular without murmurs, gallops, or rubs. GI: The abdomen is soft, obese, nondistended, with tenderness to palpation in epigastric and RUQ areas, with positive Murphy's sign. There were no palpable masses.  Prior umbilical hernia repair scars well healed. MUSCULOSKELETAL:  Normal muscle strength and tone in all four extremities.  No peripheral edema or cyanosis. SKIN: Skin turgor is normal. There are no pathologic skin lesions.  NEUROLOGIC:  Motor and sensation is grossly normal.  Cranial nerves are grossly intact. PSYCH:  Alert and oriented to person, place and time. Affect is normal.  Laboratory Analysis: Results for orders placed or performed during the hospital encounter of 06/28/17 (from the past 24 hour(s))  CBC with Differential/Platelet     Status: None   Collection Time: 06/28/17  3:30 PM  Result Value Ref Range   WBC 6.4 3.8 - 10.6 K/uL   RBC 5.28 4.40 - 5.90 MIL/uL   Hemoglobin 15.1 13.0 - 18.0 g/dL   HCT  16.1 09.6 - 04.5 %   MCV 81.1 80.0 - 100.0 fL   MCH 28.6 26.0 - 34.0 pg   MCHC 35.3 32.0 - 36.0 g/dL   RDW 40.9 81.1 - 91.4 %   Platelets 260 150 - 440 K/uL   Neutrophils Relative % 58 %   Neutro Abs 3.7 1.4 - 6.5 K/uL   Lymphocytes Relative 27 %   Lymphs Abs 1.7 1.0 - 3.6 K/uL   Monocytes Relative 10 %   Monocytes Absolute 0.6 0.2 - 1.0 K/uL   Eosinophils Relative 4 %   Eosinophils Absolute 0.2 0 - 0.7 K/uL   Basophils Relative 1 %   Basophils Absolute 0.1 0 - 0.1 K/uL  Comprehensive metabolic panel     Status: Abnormal   Collection Time: 06/28/17  3:30 PM  Result Value Ref Range   Sodium 139 135 - 145 mmol/L   Potassium 4.2 3.5 - 5.1 mmol/L   Chloride 101 101 - 111 mmol/L  CO2 27 22 - 32 mmol/L   Glucose, Bld 125 (H) 65 - 99 mg/dL   BUN 21 (H) 6 - 20 mg/dL   Creatinine, Ser 4.09 0.61 - 1.24 mg/dL   Calcium 81.1 8.9 - 91.4 mg/dL   Total Protein 8.3 (H) 6.5 - 8.1 g/dL   Albumin 5.3 (H) 3.5 - 5.0 g/dL   AST 24 15 - 41 U/L   ALT 20 17 - 63 U/L   Alkaline Phosphatase 45 38 - 126 U/L   Total Bilirubin 0.7 0.3 - 1.2 mg/dL   GFR calc non Af Amer >60 >60 mL/min   GFR calc Af Amer >60 >60 mL/min   Anion gap 11 5 - 15  Lipase, blood     Status: None   Collection Time: 06/28/17  3:30 PM  Result Value Ref Range   Lipase 49 11 - 51 U/L  Troponin I     Status: None   Collection Time: 06/28/17  3:30 PM  Result Value Ref Range   Troponin I <0.03 <0.03 ng/mL  Type and screen     Status: None   Collection Time: 06/28/17  3:30 PM  Result Value Ref Range   ABO/RH(D) A POS    Antibody Screen NEG    Sample Expiration 07/01/2017   Lactic acid, plasma     Status: None   Collection Time: 06/28/17  3:30 PM  Result Value Ref Range   Lactic Acid, Venous 1.4 0.5 - 1.9 mmol/L    Imaging: Ct Angio Chest/abd/pel For Dissection W And/or Wo Contrast  Result Date: 06/28/2017 CLINICAL DATA:  Severe mid abdominal pain, diaphoresis EXAM: CT ANGIOGRAPHY CHEST, ABDOMEN AND PELVIS TECHNIQUE:  Multidetector CT imaging through the chest, abdomen and pelvis was performed using the standard protocol during bolus administration of intravenous contrast. Multiplanar reconstructed images and MIPs were obtained and reviewed to evaluate the vascular anatomy. CONTRAST:  125 mL Isovue 300 IV COMPARISON:  CT abdomen/ pelvis dated 05/29/2016 FINDINGS: CTA CHEST FINDINGS Cardiovascular: On unenhanced CT, there is no evidence of intramural hematoma. Preferential opacification of the thoracic aorta. No evidence of thoracic aortic aneurysm or dissection. Mild atherosclerotic calcifications of the aortic root/valve and descending thoracic aorta. No evidence of pulmonary embolism to the lobar level. The heart is normal in size.  No pericardial effusion. Mild three-vessel coronary atherosclerosis. Mediastinum/Nodes: No suspicious mediastinal lymphadenopathy. Visualized thyroid is unremarkable. Lungs/Pleura: Mild linear scarring in the left lower lobe. No focal consolidation. No suspicious pulmonary nodules. No pleural effusion or pneumothorax. Musculoskeletal: Mild degenerative changes of the lower thoracic spine. Review of the MIP images confirms the above findings. CTA ABDOMEN AND PELVIS FINDINGS VASCULAR Aorta: Patent. No evidence abdominal aortic aneurysm or dissection. Mild atherosclerotic calcifications of the infrarenal abdominal aorta. Celiac: Patent. SMA: Patent. Renals: Patent bilaterally. IMA: Patent. Inflow: Patent. Mild atherosclerotic calcifications of the bilateral iliac arteries. Veins: Unremarkable. Review of the MIP images confirms the above findings. NON-VASCULAR Hepatobiliary: Liver is within normal limits. Layering subcentimeter gallstone (series 4/image 99), without associated inflammatory changes. Pancreas: Within normal limits. Spleen: Within normal limits. Adrenals/Urinary Tract: Adrenal glands are within normal limits. Kidneys are within normal limits.  No hydronephrosis. Bladder is mildly distended  but otherwise unremarkable. Stomach/Bowel: Stomach is within normal limits. No evidence of bowel obstruction. Normal appendix (series 4/ image 132). Lymphatic: No suspicious abdominopelvic lymphadenopathy. Reproductive: Prostate is grossly unremarkable. Other: No abdominopelvic ascites. Small fat containing bilateral inguinal hernias (series 4/ image 195). Musculoskeletal: Mild degenerative changes at L3-4. Review of  the MIP images confirms the above findings. IMPRESSION: No evidence of thoracoabdominal aortic aneurysm or dissection. No evidence of pulmonary embolism. Cholelithiasis, without associated inflammatory changes. Additional ancillary findings as above. Electronically Signed   By: Charline BillsSriyesh  Krishnan M.D.   On: 06/28/2017 16:05   [place] Abdomen Limited Ruq  Result Date: 06/28/2017 CLINICAL DATA:  Epigastric pain abdominal pain. EXAM: ULTRASOUND ABDOMEN LIMITED RIGHT UPPER QUADRANT COMPARISON:  CT 05/29/2016 FINDINGS: Gallbladder: 1.6 cm gallstone noted in lumen gallbladder. No gallbladder wall thickening or pericholecystic fluid. Negative sonographic Murphy's sign. Common bile duct: Diameter: Normal at 4 mm Liver: Uniform mild  increase in hepatic echogenicity.  No duct dilatation. IMPRESSION: 1. Cholelithiasis without evidence of cholecystitis. 2. No biliary duct dilatation. Electronically Signed   By: Genevive BiStewart  Edmunds M.D.   On: 06/28/2017 17:20    Assessment and Plan: This is a 66 y.o. male who presents with acute cholecystitis.  I have independently viewed the patient's imaging studies and reviewed his laboratory studies. Overall, imaging is unimpressive, and his labs are normal.  However, his exam does show a positive Murphy's sign and he is in significant pain.  Discussed with the patient that he would be admitted to the general surgery team for management of cholecystitis.  He will be NPO after midnight and be started now on IV antibiotics.  He will have appropriate pain and nausea control.   Discussed with patient and family that he would be an add-on for surgery tomorrow, and cannot guarantee him a time, and could potentially have to wait until 8/1 if cannot do him tomorrow.  Patient understands this and is in agreement with our plan.   Howie IllJose Luis Keivon Garden, MD [facility]

## 2017-06-28 NOTE — ED Notes (Addendum)
Dr. Piscoya at bedside  

## 2017-06-28 NOTE — ED Notes (Signed)
Patient taken to ultrasound.

## 2017-06-28 NOTE — ED Triage Notes (Signed)
Pt here with c/o mid abdominal pain that started 30 min PTA, pt diaphoretic, moaning in triage.

## 2017-06-28 NOTE — ED Notes (Signed)
Zosyn not loaded in this pyxis. Pharmacy informed who stated the floor has the ABX.

## 2017-06-28 NOTE — ED Notes (Signed)
Patient taken off O2 for transport to the floor. Floor nurse informed. Patient is alert and oriented. NAD.

## 2017-06-29 ENCOUNTER — Inpatient Hospital Stay: Payer: Medicare Other | Admitting: Anesthesiology

## 2017-06-29 ENCOUNTER — Encounter
Admission: EM | Disposition: A | Discharge status: Home / Self Care | Payer: Self-pay | Source: Home / Self Care | Attending: Surgery

## 2017-06-29 ENCOUNTER — Encounter: Payer: Self-pay | Admitting: *Deleted

## 2017-06-29 DIAGNOSIS — R1013 Epigastric pain: Secondary | ICD-10-CM | POA: Diagnosis not present

## 2017-06-29 DIAGNOSIS — K819 Cholecystitis, unspecified: Secondary | ICD-10-CM

## 2017-06-29 DIAGNOSIS — K8000 Calculus of gallbladder with acute cholecystitis without obstruction: Secondary | ICD-10-CM | POA: Diagnosis not present

## 2017-06-29 HISTORY — PX: CHOLECYSTECTOMY: SHX55

## 2017-06-29 LAB — COMPREHENSIVE METABOLIC PANEL
ALT: 18 U/L (ref 17–63)
AST: 20 U/L (ref 15–41)
Albumin: 4.4 g/dL (ref 3.5–5.0)
Alkaline Phosphatase: 32 U/L — ABNORMAL LOW (ref 38–126)
Anion gap: 9 (ref 5–15)
BUN: 13 mg/dL (ref 6–20)
CO2: 28 mmol/L (ref 22–32)
CREATININE: 0.81 mg/dL (ref 0.61–1.24)
Calcium: 9.3 mg/dL (ref 8.9–10.3)
Chloride: 102 mmol/L (ref 101–111)
Glucose, Bld: 133 mg/dL — ABNORMAL HIGH (ref 65–99)
Potassium: 3.8 mmol/L (ref 3.5–5.1)
Sodium: 139 mmol/L (ref 135–145)
Total Bilirubin: 0.7 mg/dL (ref 0.3–1.2)
Total Protein: 7.2 g/dL (ref 6.5–8.1)

## 2017-06-29 LAB — CBC
HEMATOCRIT: 41.7 % (ref 40.0–52.0)
HEMOGLOBIN: 14.6 g/dL (ref 13.0–18.0)
MCH: 28.6 pg (ref 26.0–34.0)
MCHC: 35.1 g/dL (ref 32.0–36.0)
MCV: 81.7 fL (ref 80.0–100.0)
Platelets: 257 10*3/uL (ref 150–440)
RBC: 5.11 MIL/uL (ref 4.40–5.90)
RDW: 13 % (ref 11.5–14.5)
WBC: 16.3 10*3/uL — AB (ref 3.8–10.6)

## 2017-06-29 LAB — GLUCOSE, CAPILLARY
GLUCOSE-CAPILLARY: 162 mg/dL — AB (ref 65–99)
Glucose-Capillary: 136 mg/dL — ABNORMAL HIGH (ref 65–99)
Glucose-Capillary: 125 mg/dL — ABNORMAL HIGH (ref 65–99)
Glucose-Capillary: 125 mg/dL — ABNORMAL HIGH (ref 65–99)
Glucose-Capillary: 110 mg/dL — ABNORMAL HIGH (ref 65–99)

## 2017-06-29 SURGERY — LAPAROSCOPIC CHOLECYSTECTOMY
Anesthesia: General

## 2017-06-29 MED ORDER — SODIUM CHLORIDE 0.9 % IV SOLN
INTRAVENOUS | Status: DC
  Administered 2017-06-29 (×2): via INTRAVENOUS

## 2017-06-29 MED ORDER — BUPIVACAINE-EPINEPHRINE (PF) 0.5% -1:200000 IJ SOLN
INTRAMUSCULAR | Status: AC
  Filled 2017-06-29: qty 30

## 2017-06-29 MED ORDER — PROPOFOL 10 MG/ML IV BOLUS
INTRAVENOUS | Status: DC | PRN
Start: 2017-06-29 — End: 2017-06-29
  Administered 2017-06-29: 150 mg via INTRAVENOUS
  Administered 2017-06-29: 50 mg via INTRAVENOUS

## 2017-06-29 MED ORDER — LIDOCAINE HCL (PF) 2 % IJ SOLN
INTRAMUSCULAR | Status: AC
  Filled 2017-06-29: qty 2

## 2017-06-29 MED ORDER — ROCURONIUM BROMIDE 100 MG/10ML IV SOLN
INTRAVENOUS | Status: DC | PRN
  Administered 2017-06-29 (×2): 10 mg via INTRAVENOUS
  Administered 2017-06-29: 40 mg via INTRAVENOUS

## 2017-06-29 MED ORDER — LIDOCAINE HCL (CARDIAC) 20 MG/ML IV SOLN
INTRAVENOUS | Status: DC | PRN
  Administered 2017-06-29: 50 mg via INTRAVENOUS

## 2017-06-29 MED ORDER — MIDAZOLAM HCL 2 MG/2ML IJ SOLN
INTRAMUSCULAR | Status: DC | PRN
Start: 2017-06-29 — End: 2017-06-29
  Administered 2017-06-29: 2 mg via INTRAVENOUS

## 2017-06-29 MED ORDER — SUGAMMADEX SODIUM 200 MG/2ML IV SOLN
INTRAVENOUS | Status: AC
  Filled 2017-06-29: qty 2

## 2017-06-29 MED ORDER — FENTANYL CITRATE (PF) 100 MCG/2ML IJ SOLN
INTRAMUSCULAR | Status: AC
  Filled 2017-06-29: qty 2

## 2017-06-29 MED ORDER — FENTANYL CITRATE (PF) 100 MCG/2ML IJ SOLN
INTRAMUSCULAR | Status: DC | PRN
  Administered 2017-06-29: 50 ug via INTRAVENOUS
  Administered 2017-06-29: 100 ug via INTRAVENOUS
  Administered 2017-06-29: 50 ug via INTRAVENOUS

## 2017-06-29 MED ORDER — HYDROMORPHONE HCL 1 MG/ML IJ SOLN
INTRAMUSCULAR | Status: DC | PRN
  Administered 2017-06-29 (×2): 0.5 mg via INTRAVENOUS

## 2017-06-29 MED ORDER — FENTANYL CITRATE (PF) 100 MCG/2ML IJ SOLN
INTRAMUSCULAR | Status: AC
  Administered 2017-06-29: 50 ug via INTRAVENOUS
  Filled 2017-06-29: qty 2

## 2017-06-29 MED ORDER — ONDANSETRON HCL 4 MG/2ML IJ SOLN
INTRAMUSCULAR | Status: DC | PRN
  Administered 2017-06-29: 4 mg via INTRAVENOUS

## 2017-06-29 MED ORDER — OXYCODONE HCL 5 MG/5ML PO SOLN
5.0000 mg | Freq: Once | ORAL | Status: DC | PRN
Start: 2017-06-29 — End: 2017-06-29

## 2017-06-29 MED ORDER — ROCURONIUM BROMIDE 50 MG/5ML IV SOLN
INTRAVENOUS | Status: AC
  Filled 2017-06-29: qty 1

## 2017-06-29 MED ORDER — MEPERIDINE HCL 25 MG/ML IJ SOLN
25.0000 mg | Freq: Once | INTRAMUSCULAR | Status: AC
  Administered 2017-06-29: 25 mg via INTRAVENOUS

## 2017-06-29 MED ORDER — MIDAZOLAM HCL 2 MG/2ML IJ SOLN
INTRAMUSCULAR | Status: AC
  Filled 2017-06-29: qty 2

## 2017-06-29 MED ORDER — SUCCINYLCHOLINE CHLORIDE 20 MG/ML IJ SOLN
INTRAMUSCULAR | Status: DC | PRN
  Administered 2017-06-29: 100 mg via INTRAVENOUS

## 2017-06-29 MED ORDER — ACETAMINOPHEN 10 MG/ML IV SOLN
INTRAVENOUS | Status: AC
  Filled 2017-06-29: qty 100

## 2017-06-29 MED ORDER — MEPERIDINE HCL 50 MG/ML IJ SOLN
INTRAMUSCULAR | Status: AC
  Administered 2017-06-29: 50 mg
  Filled 2017-06-29: qty 1

## 2017-06-29 MED ORDER — GLYCOPYRROLATE 0.2 MG/ML IJ SOLN
INTRAMUSCULAR | Status: DC | PRN
  Administered 2017-06-29: 0.2 mg via INTRAVENOUS

## 2017-06-29 MED ORDER — HYDROMORPHONE HCL 1 MG/ML IJ SOLN
INTRAMUSCULAR | Status: AC
  Filled 2017-06-29: qty 1

## 2017-06-29 MED ORDER — PROPOFOL 10 MG/ML IV BOLUS
INTRAVENOUS | Status: AC
  Filled 2017-06-29: qty 20

## 2017-06-29 MED ORDER — FENTANYL CITRATE (PF) 100 MCG/2ML IJ SOLN: 25.0000 ug | INTRAMUSCULAR | Status: DC | PRN

## 2017-06-29 MED ORDER — BUPIVACAINE-EPINEPHRINE 0.5% -1:200000 IJ SOLN
INTRAMUSCULAR | Status: DC | PRN
Start: 2017-06-29 — End: 2017-06-29
  Administered 2017-06-29: 30 mL

## 2017-06-29 MED ORDER — GLYCOPYRROLATE 0.2 MG/ML IJ SOLN
INTRAMUSCULAR | Status: AC
  Filled 2017-06-29: qty 1

## 2017-06-29 MED ORDER — OXYCODONE HCL 5 MG PO TABS: 5.0000 mg | ORAL_TABLET | Freq: Once | ORAL | Status: DC | PRN

## 2017-06-29 MED ORDER — ESMOLOL HCL 100 MG/10ML IV SOLN
INTRAVENOUS | Status: AC
  Filled 2017-06-29: qty 10

## 2017-06-29 MED ORDER — ESMOLOL HCL 100 MG/10ML IV SOLN
INTRAVENOUS | Status: DC | PRN
  Administered 2017-06-29 (×3): 20 mg via INTRAVENOUS

## 2017-06-29 MED ORDER — ACETAMINOPHEN 10 MG/ML IV SOLN
INTRAVENOUS | Status: DC | PRN
  Administered 2017-06-29: 1000 mg via INTRAVENOUS

## 2017-06-29 MED ORDER — SUGAMMADEX SODIUM 200 MG/2ML IV SOLN
INTRAVENOUS | Status: DC | PRN
  Administered 2017-06-29: 200 mg via INTRAVENOUS

## 2017-06-29 MED ORDER — FENTANYL CITRATE (PF) 100 MCG/2ML IJ SOLN
50.0000 ug | Freq: Once | INTRAMUSCULAR | Status: AC
  Administered 2017-06-29: 50 ug via INTRAVENOUS

## 2017-06-29 MED ORDER — ONDANSETRON HCL 4 MG/2ML IJ SOLN
INTRAMUSCULAR | Status: AC
  Filled 2017-06-29: qty 2

## 2017-06-29 SURGICAL SUPPLY — 48 items
APPLIER CLIP 5 13 M/L LIGAMAX5 (MISCELLANEOUS) ×3
BLADE SURG 15 STRL LF DISP TIS (BLADE) ×1 IMPLANT
BLADE SURG 15 STRL SS (BLADE) ×2
BULB RESERV EVAC DRAIN JP 100C (MISCELLANEOUS) ×3 IMPLANT
CANISTER SUCT 1200ML W/VALVE (MISCELLANEOUS) ×3 IMPLANT
CATH CHOLANGI 4FR 420404F (CATHETERS) IMPLANT
CHLORAPREP W/TINT 26ML (MISCELLANEOUS) ×3 IMPLANT
CLIP APPLIE 5 13 M/L LIGAMAX5 (MISCELLANEOUS) ×1 IMPLANT
CONRAY 60ML FOR OR (MISCELLANEOUS) ×3 IMPLANT
DERMABOND ADVANCED (GAUZE/BANDAGES/DRESSINGS) ×2
DERMABOND ADVANCED .7 DNX12 (GAUZE/BANDAGES/DRESSINGS) ×1 IMPLANT
DRAIN CHANNEL JP 19F (MISCELLANEOUS) ×3 IMPLANT
DRAPE C-ARM XRAY 36X54 (DRAPES) IMPLANT
DRSG OPSITE POSTOP 3X4 (GAUZE/BANDAGES/DRESSINGS) ×9 IMPLANT
DRSG TEGADERM 4X4.75 (GAUZE/BANDAGES/DRESSINGS) ×3 IMPLANT
ELECT REM PT RETURN 9FT ADLT (ELECTROSURGICAL) ×3
ELECTRODE REM PT RTRN 9FT ADLT (ELECTROSURGICAL) ×1 IMPLANT
FILTER LAP SMOKE EVAC STRL (MISCELLANEOUS) ×3 IMPLANT
GLOVE SURG SYN 7.0 (GLOVE) ×3 IMPLANT
GLOVE SURG SYN 7.5  E (GLOVE) ×2
GLOVE SURG SYN 7.5 E (GLOVE) ×1 IMPLANT
GOWN STRL REUS W/ TWL LRG LVL3 (GOWN DISPOSABLE) ×3 IMPLANT
GOWN STRL REUS W/TWL LRG LVL3 (GOWN DISPOSABLE) ×6
IRRIGATION STRYKERFLOW (MISCELLANEOUS) IMPLANT
IRRIGATOR STRYKERFLOW (MISCELLANEOUS)
IV CATH ANGIO 12GX3 LT BLUE (NEEDLE) ×3 IMPLANT
IV NS 1000ML (IV SOLUTION)
IV NS 1000ML BAXH (IV SOLUTION) IMPLANT
JACKSON PRATT 10 (INSTRUMENTS) IMPLANT
L-HOOK LAP DISP 36CM (ELECTROSURGICAL) ×3
LABEL OR SOLS (LABEL) ×3 IMPLANT
LHOOK LAP DISP 36CM (ELECTROSURGICAL) ×1 IMPLANT
NDL SAFETY 22GX1.5 (NEEDLE) ×3 IMPLANT
NEEDLE HYPO 22GX1.5 SAFETY (NEEDLE) ×3 IMPLANT
PACK LAP CHOLECYSTECTOMY (MISCELLANEOUS) ×3 IMPLANT
PENCIL ELECTRO HAND CTR (MISCELLANEOUS) ×3 IMPLANT
POUCH SPECIMEN RETRIEVAL 10MM (ENDOMECHANICALS) ×3 IMPLANT
SCISSORS METZENBAUM CVD 33 (INSTRUMENTS) ×3 IMPLANT
SLEEVE ADV FIXATION 5X100MM (TROCAR) ×9 IMPLANT
SPONGE VERSALON 4X4 4PLY (MISCELLANEOUS) IMPLANT
STAPLER SKIN PROX 35W (STAPLE) ×3 IMPLANT
SUT MNCRL 4-0 (SUTURE) ×2
SUT MNCRL 4-0 27XMFL (SUTURE) ×1
SUT VICRYL 0 AB UR-6 (SUTURE) ×3 IMPLANT
SUTURE MNCRL 4-0 27XMF (SUTURE) ×1 IMPLANT
TROCAR 130MM GELPORT  DAV (MISCELLANEOUS) ×3 IMPLANT
TROCAR Z-THREAD OPTICAL 5X100M (TROCAR) ×3 IMPLANT
TUBING INSUFFLATOR HI FLOW (MISCELLANEOUS) ×3 IMPLANT

## 2017-06-29 NOTE — Anesthesia Post-op Follow-up Note (Cosign Needed)
Anesthesia QCDR form completed.        

## 2017-06-29 NOTE — Anesthesia Postprocedure Evaluation (Signed)
Anesthesia Post Note  Patient: Juan Watson  Procedure(s) Performed: Procedure(s) (LRB): LAPAROSCOPIC CHOLECYSTECTOMY (N/A)  Patient location during evaluation: PACU Anesthesia Type: General Level of consciousness: awake and alert and oriented Pain management: pain level controlled Vital Signs Assessment: post-procedure vital signs reviewed and stable Respiratory status: spontaneous breathing Cardiovascular status: blood pressure returned to baseline Anesthetic complications: no     Last Vitals:  Vitals:   06/29/17 1957 06/29/17 1958  BP: (!) 153/82   Pulse: 99 94  Resp: 18   Temp: 37.3 C     Last Pain:  Vitals:   06/29/17 2000  TempSrc:   PainSc: 2                  Betrice Wanat

## 2017-06-29 NOTE — Transfer of Care (Signed)
Immediate Anesthesia Transfer of Care Note  Patient: Juan Watson  Procedure(s) Performed: Procedure(s): LAPAROSCOPIC CHOLECYSTECTOMY (N/A)  Patient Location: PACU  Anesthesia Type:General  Level of Consciousness: awake  Airway & Oxygen Therapy: Patient Spontanous Breathing and Patient connected to face mask oxygen  Post-op Assessment: Report given to RN and Post -op Vital signs reviewed and stable  Post vital signs: Reviewed  Last Vitals:  Vitals:   06/29/17 1457 06/29/17 1852  BP:  (!) 142/83  Pulse: 78 (!) 110  Resp:  19  Temp:      Last Pain:  Vitals:   06/29/17 1457  TempSrc:   PainSc: 8       Patients Stated Pain Goal: 0 (06/29/17 [id number])  Complications: No apparent anesthesia complications

## 2017-06-29 NOTE — Anesthesia Procedure Notes (Signed)
Procedure Name: Intubation Performed by: Mathews ArgyleLOGAN, Juan Watson Pre-anesthesia Checklist: Patient identified, Patient being monitored, Timeout performed, Emergency Drugs available and Suction available Patient Re-evaluated:Patient Re-evaluated prior to induction Oxygen Delivery Method: Circle system utilized Preoxygenation: Pre-oxygenation with 100% oxygen Induction Type: IV induction and Rapid sequence Laryngoscope Size: Miller and 2 Grade View: Grade I Tube type: Oral Tube size: 7.5 mm Number of attempts: 1 Airway Equipment and Method: Stylet Placement Confirmation: ETT inserted through vocal cords under direct vision,  positive ETCO2 and breath sounds checked- equal and bilateral Secured at: 22 cm Tube secured with: Tape Dental Injury: Teeth and Oropharynx as per pre-operative assessment

## 2017-06-29 NOTE — Anesthesia Preprocedure Evaluation (Signed)
Anesthesia Evaluation  Patient identified by MRN, date of birth, ID band Patient awake    Reviewed: Allergy & Precautions, H&P , NPO status , Patient's Chart, lab work & pertinent test results  History of Anesthesia Complications Negative for: history of anesthetic complications  Airway Mallampati: III  TM Distance: <3 FB Neck ROM: limited    Dental  (+) Poor Dentition, Chipped, Implants   Pulmonary neg shortness of breath, former smoker,           Cardiovascular Exercise Tolerance: Good hypertension, (-) angina(-) Past MI and (-) DOE      Neuro/Psych negative neurological ROS  negative psych ROS   GI/Hepatic Neg liver ROS, GERD  Controlled and Medicated,  Endo/Other  diabetes, Type 2  Renal/GU      Musculoskeletal  (+) Arthritis ,   Abdominal   Peds  Hematology negative hematology ROS (+)   Anesthesia Other Findings Signs and symptoms suggestive of sleep apnea   Past Medical History: No date: Arthritis No date: BPH (benign prostatic hyperplasia) No date: Diabetes mellitus without complication (HCC) No date: GERD (gastroesophageal reflux disease) No date: HOH (hard of hearing)     Comment:  aids No date: Hypertension  Past Surgical History: [age]: BACK SURGERY 70's: CARDIAC CATHETERIZATION     Comment:  pericarditis 03/23/2016: CATARACT EXTRACTION W/PHACO; Right     Comment:  Procedure: CATARACT EXTRACTION PHACO AND INTRAOCULAR               LENS PLACEMENT (IOC);  Surgeon: Sallee LangeSteven Dingeldein, MD;                Location: [facility] ORS;  Service: Ophthalmology;  Laterality:              Right;  US 01:31  11,: HERNIA REPAIR     Comment:  umbilical x2 No date: KNEE DEBRIDEMENT; Left     Comment:  tendon 72: TONSILLECTOMY 04/15/2015: TOTAL KNEE ARTHROPLASTY; Right     Comment:  Procedure: TOTAL KNEE ARTHROPLASTY;  Surgeon: Gean BirchwoodFrank               Rowan, MD;  Location: [facility] OR;  Service: Orthopedics;    Laterality: Right; ?: TRANSURETHRAL RESECTION OF PROSTATE  BMI    Body Mass Index:  34.14 kg/m      Reproductive/Obstetrics negative OB ROS                             Anesthesia Physical Anesthesia Plan  ASA: III  Anesthesia Plan: General ETT   Post-op Pain Management:    Induction: Intravenous  PONV Risk Score and Plan: 3 and Ondansetron, Dexamethasone, Midazolam and Treatment may vary due to age or medical condition  Airway Management Planned: Oral ETT  Additional Equipment:   Intra-op Plan:   Post-operative Plan: Extubation in OR  Informed Consent: I have reviewed the patients History and Physical, chart, labs and discussed the procedure including the risks, benefits and alternatives for the proposed anesthesia with the patient or authorized representative who has indicated his/her understanding and acceptance.   Dental Advisory Given  Plan Discussed with: Anesthesiologist, CRNA and Surgeon  Anesthesia Plan Comments: (Patient consented for risks of anesthesia including but not limited to:  - adverse reactions to medications - damage to teeth, lips or other oral mucosa - sore throat or hoarseness - Damage to heart, brain, lungs or loss of life  Patient voiced understanding.)  Anesthesia Quick Evaluation  

## 2017-06-29 NOTE — Op Note (Addendum)
  Procedure Date:  06/29/2017  Pre-operative Diagnosis:  Acute cholecystitis  Post-operative Diagnosis:  Acute suppurative cholecystitis  Procedure:  Laparoscopic cholecystectomy  Surgeon:  Howie IllJose Luis Manu Rubey, MD  Assistant:  Hulda Marinimothy Oaks, MD  Anesthesia:  General endotracheal  Estimated Blood Loss:  75 ml  Specimens:  gallbladder  Complications:  None  Indications for Procedure:  This is a 66 y.o. male who presents with abdominal pain and workup revealing acute cholecystitis.  The benefits, complications, treatment options, and expected outcomes were discussed with the patient. The risks of bleeding, infection, recurrence of symptoms, failure to resolve symptoms, bile duct damage, bile duct leak, retained common bile duct stone, bowel injury, and need for further procedures were all discussed with the patient and he was willing to proceed.  Description of Procedure: The patient was correctly identified in the preoperative area and brought into the operating room.  The patient was placed supine with VTE prophylaxis in place.  Appropriate time-outs were performed.  Anesthesia was induced and the patient was intubated.  Appropriate antibiotics were infused.  The abdomen was prepped and draped in a sterile fashion. A supraumbilical incision was made. A cutdown technique was used to enter the abdominal cavity without injury and avoiding the patient's prior umbilical hernia repair site, and a Hasson trocar was inserted.  Pneumoperitoneum was obtained with appropriate opening pressures.  A 5-mm port was placed in the subxiphoid area and two 5-mm ports were placed in the right upper quadrant under direct visualization.  The gallbladder was identified.  The gallbladder was intrahepatic and also distended, and it required needle decompression. The fundus was grasped and retracted cephalad.  Significant adhesions were lysed bluntly and with electrocautery. The infundibulum was grasped and retracted  laterally, exposing the peritoneum overlying the gallbladder.  This was incised with electrocautery and extended on either side of the gallbladder, and on the medial side, this caused a small gallbladder perforation which leaked purulent fluid.  Further dissection was able to reveal the cystic duct and cystic artery were Both were clipped twice proximally and once distally, cutting in between.  The gallbladder was taken from the gallbladder fossa in a retrograde fashion with electrocautery. The gallbladder was placed in an Endocatch bag and brought out via the umbilical incision. The liver bed was inspected and any bleeding was controlled with electrocautery. The right upper quadrant was then inspected again revealing intact clips, no bleeding, and no ductal injury.  The area was thoroughly irrigated multiple times.  A 19Fr Blake drain was placed via one of the right lateral ports, with the tip in the right upper quadrant and gallbladder fossa.  It was secured to the skin using a 3-0 Nylon.  The 5 mm ports were removed under direct visualization and the Hasson trocar was removed.  The fascial opening was closed using 0 vicryl suture.  Local anesthetic was infused in all incisions and the incisions were closed with staples.  The wounds were cleaned and dressed with honeycomb dressing.  The drain site was dressed with 4x4 gauze and tegaderm.  The patient was emerged from anesthesia and extubated and brought to the recovery room for further management.  The patient tolerated the procedure well and all counts were correct at the end of the case.   Howie IllJose Luis Jorita Bohanon, MD

## 2017-06-29 NOTE — Progress Notes (Signed)
06/29/2017  Subjective: Patient admitted yesterday with acute cholecystitis. No acute events overnight. Reports that his pain is better controlled.  Vital signs: Temp:  [97.6 F (36.4 C)-98.7 F (37.1 C)] 98.7 F (37.1 C) (07/31 0502) Pulse Rate:  [61-82] 68 (07/31 0502) Resp:  [14-24] 20 (07/31 0502) BP: (130-253)/(64-115) 148/75 (07/31 0502) SpO2:  [96 %-99 %] 96 % (07/31 0502) Weight:  [99 kg (218 lb 4.8 oz)] 99 kg (218 lb 4.8 oz) (07/30 2050)   Intake/Output: 07/30 0701 - 07/31 0700 In: 2724 [P.O.:720; I.V.:904; IV Piggyback:1100] Out: 2550 [Urine:2550] Last BM Date: 06/28/17  Physical Exam: Constitutional: No acute distress Abdomen:  Soft, nondistended, with tenderness to palpation in the right upper quadrant and epigastric areas with positive Murphy sign.  Labs:   Recent Labs  06/28/17 1530 06/29/17 0501  WBC 6.4 16.3*  HGB 15.1 14.6  HCT 42.8 41.7  PLT 260 257    Recent Labs  06/28/17 1530 06/29/17 0501  NA 139 139  K 4.2 3.8  CL 101 102  CO2 27 28  GLUCOSE 125* 133*  BUN 21* 13  CREATININE 1.12 0.81  CALCIUM 10.3 9.3   No results for input(s): LABPROT, INR in the last 72 hours.  Imaging: Ct Angio Chest/abd/pel For Dissection W And/or Wo Contrast  Result Date: 06/28/2017 CLINICAL DATA:  Severe mid abdominal pain, diaphoresis EXAM: CT ANGIOGRAPHY CHEST, ABDOMEN AND PELVIS TECHNIQUE: Multidetector CT imaging through the chest, abdomen and pelvis was performed using the standard protocol during bolus administration of intravenous contrast. Multiplanar reconstructed images and MIPs were obtained and reviewed to evaluate the vascular anatomy. CONTRAST:  125 mL Isovue 300 IV COMPARISON:  CT abdomen/ pelvis dated 05/29/2016 FINDINGS: CTA CHEST FINDINGS Cardiovascular: On unenhanced CT, there is no evidence of intramural hematoma. Preferential opacification of the thoracic aorta. No evidence of thoracic aortic aneurysm or dissection. Mild atherosclerotic  calcifications of the aortic root/valve and descending thoracic aorta. No evidence of pulmonary embolism to the lobar level. The heart is normal in size.  No pericardial effusion. Mild three-vessel coronary atherosclerosis. Mediastinum/Nodes: No suspicious mediastinal lymphadenopathy. Visualized thyroid is unremarkable. Lungs/Pleura: Mild linear scarring in the left lower lobe. No focal consolidation. No suspicious pulmonary nodules. No pleural effusion or pneumothorax. Musculoskeletal: Mild degenerative changes of the lower thoracic spine. Review of the MIP images confirms the above findings. CTA ABDOMEN AND PELVIS FINDINGS VASCULAR Aorta: Patent. No evidence abdominal aortic aneurysm or dissection. Mild atherosclerotic calcifications of the infrarenal abdominal aorta. Celiac: Patent. SMA: Patent. Renals: Patent bilaterally. IMA: Patent. Inflow: Patent. Mild atherosclerotic calcifications of the bilateral iliac arteries. Veins: Unremarkable. Review of the MIP images confirms the above findings. NON-VASCULAR Hepatobiliary: Liver is within normal limits. Layering subcentimeter gallstone (series 4/image 99), without associated inflammatory changes. Pancreas: Within normal limits. Spleen: Within normal limits. Adrenals/Urinary Tract: Adrenal glands are within normal limits. Kidneys are within normal limits.  No hydronephrosis. Bladder is mildly distended but otherwise unremarkable. Stomach/Bowel: Stomach is within normal limits. No evidence of bowel obstruction. Normal appendix (series 4/ image 132). Lymphatic: No suspicious abdominopelvic lymphadenopathy. Reproductive: Prostate is grossly unremarkable. Other: No abdominopelvic ascites. Small fat containing bilateral inguinal hernias (series 4/ image 195). Musculoskeletal: Mild degenerative changes at L3-4. Review of the MIP images confirms the above findings. IMPRESSION: No evidence of thoracoabdominal aortic aneurysm or dissection. No evidence of pulmonary embolism.  Cholelithiasis, without associated inflammatory changes. Additional ancillary findings as above. Electronically Signed   By: Charline BillsSriyesh  Krishnan M.D.   On: 06/28/2017 16:05  [place] Abdomen Limited Ruq  Result Date: 06/28/2017 CLINICAL DATA:  Epigastric pain abdominal pain. EXAM: ULTRASOUND ABDOMEN LIMITED RIGHT UPPER QUADRANT COMPARISON:  CT 05/29/2016 FINDINGS: Gallbladder: 1.6 cm gallstone noted in lumen gallbladder. No gallbladder wall thickening or pericholecystic fluid. Negative sonographic Murphy's sign. Common bile duct: Diameter: Normal at 4 mm Liver: Uniform mild  increase in hepatic echogenicity.  No duct dilatation. IMPRESSION: 1. Cholelithiasis without evidence of cholecystitis. 2. No biliary duct dilatation. Electronically Signed   By: Genevive BiStewart  Edmunds M.D.   On: 06/28/2017 17:20    Assessment/Plan: 66 year old male with acute cholecystitis.  -Patient will be taken to the operating room today as an add-on. Risks and benefits of laparoscopic cholecystectomy have been discussed with the patient including risk of bleeding, infection, injury to surrounding structures. Patient is willing to proceed. -Continue IV antibiotics   Howie IllJose Luis Kimbree Casanas, MD [facility]

## 2017-06-30 ENCOUNTER — Encounter: Payer: Self-pay | Admitting: Surgery

## 2017-06-30 LAB — GLUCOSE, CAPILLARY
GLUCOSE-CAPILLARY: 119 mg/dL — AB (ref 65–99)
GLUCOSE-CAPILLARY: 161 mg/dL — AB (ref 65–99)
GLUCOSE-CAPILLARY: 111 mg/dL — AB (ref 65–99)
Glucose-Capillary: 155 mg/dL — ABNORMAL HIGH (ref 65–99)
Glucose-Capillary: 158 mg/dL — ABNORMAL HIGH (ref 65–99)
Glucose-Capillary: 166 mg/dL — ABNORMAL HIGH (ref 65–99)
Glucose-Capillary: 174 mg/dL — ABNORMAL HIGH (ref 65–99)

## 2017-06-30 MED ORDER — OXYCODONE HCL 5 MG PO TABS
5.0000 mg | ORAL_TABLET | ORAL | Status: DC | PRN
  Administered 2017-06-30: 10 mg via ORAL
  Filled 2017-06-30: qty 1
  Filled 2017-06-30: qty 2

## 2017-06-30 MED ORDER — KETOROLAC TROMETHAMINE 30 MG/ML IJ SOLN
30.0000 mg | Freq: Four times a day (QID) | INTRAMUSCULAR | Status: DC
  Administered 2017-06-30 – 2017-07-01 (×3): 30 mg via INTRAVENOUS
  Filled 2017-06-30 (×3): qty 1

## 2017-06-30 MED ORDER — INSULIN ASPART 100 UNIT/ML ~~LOC~~ SOLN
0.0000 [IU] | Freq: Three times a day (TID) | SUBCUTANEOUS | Status: DC
  Administered 2017-07-01: 2 [IU] via SUBCUTANEOUS
  Filled 2017-06-30: qty 1

## 2017-06-30 NOTE — Progress Notes (Signed)
06/30/2017  Subjective: Patient is 1 Day Post-Op s/p laparoscopic cholecystectomy.  No acute events.  Pain is well controlled.    Vital signs: Temp:  [97.5 F (36.4 C)-99.1 F (37.3 C)] 98.6 F (37 C) (08/01 0442) Pulse Rate:  [73-110] 96 (08/01 0442) Resp:  [14-23] 20 (08/01 0442) BP: (142-180)/(75-98) 153/75 (08/01 0442) SpO2:  [90 %-96 %] 91 % (08/01 0442) Weight:  [98.9 kg (218 lb)] 98.9 kg (218 lb) (07/31 1327)   Intake/Output: 07/31 0701 - 08/01 0700 In: 1991 [I.V.:1891; IV Piggyback:100] Out: 455 [Urine:300; Drains:80; Blood:75] Last BM Date: 06/28/17  Physical Exam: Constitutional: No acute distress Abdomen:  Soft, nondistended, appropriately tender to palpation.  Incisions clean, dry, intact with staples and honeycomb dressings.  JP with serosanguinous fluid.  Labs:   Recent Labs  06/28/17 1530 06/29/17 0501  WBC 6.4 16.3*  HGB 15.1 14.6  HCT 42.8 41.7  PLT 260 257    Recent Labs  06/28/17 1530 06/29/17 0501  NA 139 139  K 4.2 3.8  CL 101 102  CO2 27 28  GLUCOSE 125* 133*  BUN 21* 13  CREATININE 1.12 0.81  CALCIUM 10.3 9.3   No results for input(s): LABPROT, INR in the last 72 hours.  Imaging: No results found.  Assessment/Plan: 66 yo male s/p laparoscopic cholecystectomy.  --start clear liquid diet today --decrease IV fluid and discontinue when tolerating good po --continue IV antibiotics --continue JP drain. --advance diet as tolerated.  Possibly d/c to home today, though likely tomorrow instead.   Howie IllJose Luis Loyal Holzheimer, MD [facility]

## 2017-07-01 DIAGNOSIS — Z888 Allergy status to other drugs, medicaments and biological substances status: Secondary | ICD-10-CM | POA: Diagnosis not present

## 2017-07-01 DIAGNOSIS — Z96651 Presence of right artificial knee joint: Secondary | ICD-10-CM | POA: Diagnosis present

## 2017-07-01 DIAGNOSIS — Z7982 Long term (current) use of aspirin: Secondary | ICD-10-CM | POA: Diagnosis not present

## 2017-07-01 DIAGNOSIS — Z961 Presence of intraocular lens: Secondary | ICD-10-CM | POA: Diagnosis present

## 2017-07-01 DIAGNOSIS — Z87891 Personal history of nicotine dependence: Secondary | ICD-10-CM | POA: Diagnosis not present

## 2017-07-01 DIAGNOSIS — Z882 Allergy status to sulfonamides status: Secondary | ICD-10-CM | POA: Diagnosis not present

## 2017-07-01 DIAGNOSIS — H9190 Unspecified hearing loss, unspecified ear: Secondary | ICD-10-CM | POA: Diagnosis present

## 2017-07-01 DIAGNOSIS — N400 Enlarged prostate without lower urinary tract symptoms: Secondary | ICD-10-CM | POA: Diagnosis present

## 2017-07-01 DIAGNOSIS — Z7984 Long term (current) use of oral hypoglycemic drugs: Secondary | ICD-10-CM | POA: Diagnosis not present

## 2017-07-01 DIAGNOSIS — I10 Essential (primary) hypertension: Secondary | ICD-10-CM | POA: Diagnosis present

## 2017-07-01 DIAGNOSIS — R1013 Epigastric pain: Secondary | ICD-10-CM | POA: Diagnosis present

## 2017-07-01 DIAGNOSIS — Z91030 Bee allergy status: Secondary | ICD-10-CM | POA: Diagnosis not present

## 2017-07-01 DIAGNOSIS — Z9841 Cataract extraction status, right eye: Secondary | ICD-10-CM | POA: Diagnosis not present

## 2017-07-01 DIAGNOSIS — K219 Gastro-esophageal reflux disease without esophagitis: Secondary | ICD-10-CM | POA: Diagnosis present

## 2017-07-01 DIAGNOSIS — K8000 Calculus of gallbladder with acute cholecystitis without obstruction: Secondary | ICD-10-CM | POA: Diagnosis present

## 2017-07-01 DIAGNOSIS — M1990 Unspecified osteoarthritis, unspecified site: Secondary | ICD-10-CM | POA: Diagnosis present

## 2017-07-01 DIAGNOSIS — E119 Type 2 diabetes mellitus without complications: Secondary | ICD-10-CM | POA: Diagnosis present

## 2017-07-01 LAB — SURGICAL PATHOLOGY

## 2017-07-01 LAB — GLUCOSE, CAPILLARY: Glucose-Capillary: 130 mg/dL — ABNORMAL HIGH (ref 65–99)

## 2017-07-01 MED ORDER — IBUPROFEN 600 MG PO TABS: 600.0000 mg | ORAL_TABLET | Freq: Three times a day (TID) | ORAL | 0 refills | Status: DC | PRN

## 2017-07-01 MED ORDER — AMOXICILLIN-POT CLAVULANATE 875-125 MG PO TABS: 1.0000 | ORAL_TABLET | Freq: Two times a day (BID) | ORAL | 0 refills | Status: AC

## 2017-07-01 MED ORDER — OXYCODONE HCL 5 MG PO TABS: 5.0000 mg | ORAL_TABLET | ORAL | 0 refills | Status: DC | PRN

## 2017-07-01 NOTE — Progress Notes (Signed)
Pt discharged per MD order. IVs removed. Discharge paperwork reviewed with pt. Questions answered to satisfaction. Prescription given to pt. Education on JP drain completed. Pt declined wheelchair and chose to walk to car.

## 2017-07-01 NOTE — Discharge Summary (Signed)
Patient ID: Juan FlorasClyde D Panik MRN: [id number] DOB/AGE: May 12, 1951 66 y.o.  Admit date: 06/28/2017 Discharge date: 07/01/2017   Discharge Diagnoses:  Active Problems:   Cholecystitis   Procedures:  Laparoscopic cholecystectomy  Hospital Course: Patient was admitted on 06/28/17 with acute cholecystitis.  He was taken to the OR on 7/31 and underwent a laparoscopic cholecystectomy.  JP drain was left in place due to purulent gallbladder.  Post-op, he did well.  His diet was slowly advanced.  He was ambulating, tolerating a regular diet, voiding without issues, and his pain was well controlled.  He was deemed ready for discharge.  On exam, he was in no acute distress and had stable normal vital signs.  His abdomen was soft, obese, nondistended, and appropriately tender to palpation.  His incisions were clean, dry, and intact with staples.  His JP drain was serosanguinous.  Consults: None  Disposition: 01-Home or Self Care  Discharge Instructions    Call MD for:  difficulty breathing, headache or visual disturbances    Complete by:  As directed    Call MD for:  persistant nausea and vomiting    Complete by:  As directed    Call MD for:  redness, tenderness, or signs of infection (pain, swelling, redness, odor or green/yellow discharge around incision site)    Complete by:  As directed    Call MD for:  severe uncontrolled pain    Complete by:  As directed    Call MD for:  temperature >100.4    Complete by:  As directed    Change dressing (specify)    Complete by:  As directed    Dry gauze dressing with tape around JP drain site.  Change once daily.   Diet - low sodium heart healthy    Complete by:  As directed    Discharge instructions    Complete by:  As directed    1.  Patient may shower, but do not scrub wounds heavily and dab dry only. 2.  Do not submerge wounds in pool/tub. 3.  Do not remove staples 4.  Please empty and record JP output.  Bring record with you to clinic appointment.    Driving Restrictions    Complete by:  As directed    Do not drive while taking narcotics for pain control.   Increase activity slowly    Complete by:  As directed    Lifting restrictions    Complete by:  As directed    No heavy lifting or pushing of more than 10-15 lbs for 4 weeks.     Allergies as of 07/01/2017      Reactions   Bee Venom Swelling   Sob, swelling   Statins    Other reaction(s): Other (See Comments) SEVERE MYALGIAS   Sulfa Antibiotics Swelling   Tongue swelling      Medication List    TAKE these medications   amoxicillin-clavulanate 875-125 MG tablet Commonly known as:  AUGMENTIN Take 1 tablet by mouth 2 (two) times daily.   aspirin EC 81 MG tablet Take 81 mg by mouth at bedtime.   cloNIDine 0.1 MG tablet Commonly known as:  CATAPRES Take 0.1 mg by mouth 2 (two) times daily.   diazepam 10 MG tablet Commonly known as:  VALIUM Take 1 tablet (10 mg total) by mouth once. Take 1 hour prior to CT scan for anxiety   gemfibrozil 600 MG tablet Commonly known as:  LOPID Take 600 mg by mouth 2 (two) times  daily.   ibuprofen 600 MG tablet Commonly known as:  ADVIL,MOTRIN Take 1 tablet (600 mg total) by mouth every 8 (eight) hours as needed.   Krill Oil 300 MG Caps Take 600 mg by mouth daily.   labetalol 200 MG tablet Commonly known as:  NORMODYNE Take 200 mg by mouth 2 (two) times daily.   losartan 100 MG tablet Commonly known as:  COZAAR Take 100 mg by mouth daily.   metFORMIN 500 MG tablet Commonly known as:  GLUCOPHAGE Take 500 mg by mouth 2 (two) times daily.   omeprazole 20 MG capsule Commonly known as:  PRILOSEC Take 20 mg by mouth daily.   oxyCODONE 5 MG immediate release tablet Commonly known as:  Oxy IR/ROXICODONE Take 1-2 tablets (5-10 mg total) by mouth every 4 (four) hours as needed for moderate pain or severe pain.   vitamin E 400 UNIT capsule Take 400 Units by mouth daily. Reported on 03/23/2016      Follow-up Information     Gladys Gutman, Elita QuickJose, MD Follow up in 1 week(s).   Specialty:  Surgery Why:  For drain and staple removal Contact information: [address] [place] [address] [phone]

## 2017-07-07 ENCOUNTER — Ambulatory Visit (INDEPENDENT_AMBULATORY_CARE_PROVIDER_SITE_OTHER): Payer: Medicare Other | Admitting: General Surgery

## 2017-07-07 VITALS — BP 174/92 | HR 66 | Temp 98.2°F | Ht 67.0 in | Wt 216.8 lb

## 2017-07-07 DIAGNOSIS — Z4889 Encounter for other specified surgical aftercare: Secondary | ICD-10-CM

## 2017-07-08 ENCOUNTER — Encounter: Payer: Self-pay | Admitting: General Surgery

## 2017-07-08 NOTE — Patient Instructions (Addendum)
Please see your appointment listed below. I have placed a call to Dr.Elliots office regarding your Colonoscopy on on 07/23/17. Someone from their office will be contacting you to reschedule.  Please let us know if you continue to have diarrhea after you finish the course of Antibiotics. We have removed your staples today and placed steri-strips. You may shower but be sure to pat dry the strips. These will begin to fall off in 7-10 days.

## 2017-07-08 NOTE — Progress Notes (Signed)
Outpatient Surgical Follow Up  07/08/2017  Juan Watson is an 66 y.o. male.   Chief Complaint  Patient presents with  . Routine Post Op    Laparoscopic Cholecystectomy 06-29-17-Dr.Piscoya    HPI: 66 year old male returns to clinic for follow-up 1 week status post laparoscopic cholecystectomy. Patient reports that his pain has been well controlled. He is tolerating a diet and having bowel function. He strongly desires to have his drain removed. He denies any fevers, chills, nausea, vomiting, chest pain, shortness of breath, constipation, diarrhea.  Past Medical History:  Diagnosis Date  . Acquired flat foot 11/15/2015  . Arthritis   . Arthritis of knee, degenerative 04/15/2015  . Benign hypertension 10/17/2010  . BPH (benign prostatic hyperplasia)   . Cholecystitis 06/28/2017  . Controlled type 2 diabetes mellitus without complication ([place]) 0/06/6760   Overview:  Overview:  DIET CONTROLLED Overview:  Overview:  DIET CONTROLLED   . Diabetes mellitus without complication ([place])   . Diabetes mellitus, type 2 ([place]) 12/13/2002   Overview:  DIET CONTROLLED   . Disease of nail 07/16/2011  . Encounter for screening for malignant neoplasm of prostate 10/29/2014  . Essential (primary) hypertension 10/17/2010  . Familial aortic aneurysm 03/26/2014  . Family history of cardiovascular disease 03/26/2014  . Gastro-esophageal reflux disease without esophagitis 08/11/2013  . Generalized OA 08/11/2013  . GERD (gastroesophageal reflux disease)   . Gout 03/26/2014  . HOH (hard of hearing)    aids  . Hypercholesteremia 12/13/2002  . Hypercholesterolemia 12/13/2002  . Hypertension   . Primary osteoarthritis of knee 04/15/2015  . Primary osteoarthritis of right knee 04/14/2015  . Pure hypercholesterolemia 12/13/2002  . Skin lesion 03/11/2015  . Type 2 diabetes mellitus ([place]) 12/13/2002   Overview:  DIET CONTROLLED     Past Surgical History:  Procedure Laterality Date  . BACK SURGERY  [phone]  . CARDIAC  CATHETERIZATION  70's   pericarditis  . CATARACT EXTRACTION W/PHACO Right 03/23/2016   Procedure: CATARACT EXTRACTION PHACO AND INTRAOCULAR LENS PLACEMENT (IOC);  Surgeon: Estill Cotta, MD;  Location: [facility] ORS;  Service: Ophthalmology;  Laterality: Right;  [place] 01:31   . CHOLECYSTECTOMY N/A 06/29/2017   Procedure: LAPAROSCOPIC CHOLECYSTECTOMY;  Surgeon: Olean Ree, MD;  Location: [facility] ORS;  Service: General;  Laterality: N/A;  . HERNIA REPAIR  11,   umbilical x2  . KNEE DEBRIDEMENT Left    tendon  . TONSILLECTOMY  72  . TOTAL KNEE ARTHROPLASTY Right 04/15/2015   Procedure: TOTAL KNEE ARTHROPLASTY;  Surgeon: Frederik Pear, MD;  Location: [facility];  Service: Orthopedics;  Laterality: Right;  . TRANSURETHRAL RESECTION OF PROSTATE  ?    Family History  Problem Relation Age of Onset  . Multiple myeloma Mother   . Aneurysm Father   . Aneurysm Brother   . Nephrolithiasis Neg Hx   . Bladder Cancer Neg Hx   . Prostate cancer Neg Hx   . Kidney cancer Neg Hx     Social History:  reports that he quit smoking about 25 years ago. His smoking use included Cigarettes. He has a 50.00 pack-year smoking history. His smokeless tobacco use includes Chew. He reports that he does not drink alcohol or use drugs.  Allergies:  Allergies  Allergen Reactions  . Bee Venom Swelling    Sob, swelling Sob, swelling  . Statins Other (See Comments)    Other reaction(s): Other (See Comments) SEVERE MYALGIAS Other reaction(s): Other (See Comments) SEVERE MYALGIAS  . Sulfa Antibiotics Swelling    Tongue  swelling    Medications reviewed.    ROS A multipoint review of systems was completed, all pertinent positives and negatives are documented within the history of present illness and remainder are negative   BP (!) 174/92   Pulse 66   Temp 98.2 F (36.8 C) (Oral)   Ht 5' 7"  (1.702 m)   Wt 98.3 kg (216 lb 12.8 oz)   BMI 33.96 kg/m   Physical Exam Gen.: No acute distress  chest: Clear to  auscultation Heart: Regular rhythm Abdomen: Soft, nontender, nondistended. JP drain in place with minimal serous and was output. Staples in place was other incisions isodense erythema or drainage.    No results found for this or any previous visit (from the past 48 hour(s)). No results found.  Assessment/Plan:  1. Aftercare following surgery 66 year old male status post laparoscopic cholecystectomy. Drain and staples removed in clinic without any difficulty. Pathology reviewed with the patient. Given of the drainage removed today discussed that he would follow-up in clinic in the next 1-2 weeks to be evaluated by his operative surgeon. Discussed signs and symptoms of infection and return to clinic sooner should that occur. Otherwise he'll follow up with his operative surgeon.     Clayburn Pert, MD FACS General Surgeon  07/08/2017,11:50 AM

## 2017-07-14 ENCOUNTER — Telehealth: Payer: Self-pay

## 2017-07-14 ENCOUNTER — Telehealth: Payer: Self-pay | Admitting: Surgery

## 2017-07-14 NOTE — Telephone Encounter (Signed)
Spoke with Carley Hammedva patients wife and she stated she removed the dressing and the area does not look bad. It has very little drainage oozing from the top of the incision. She denies redness, pain, fever. The drainage is clear to light yellow color but no visible pus.  Patient was instructed to keep appointment 8/20 and to continue cleaning the area with soap and water daily and applying a dressing to absorb any drainage. Patient to call office if area shows signs/symptoms of infection.

## 2017-07-14 NOTE — Telephone Encounter (Signed)
Tried calling patient at this time,however line is busy.

## 2017-07-14 NOTE — Telephone Encounter (Signed)
Patient's wife, Carley Hammedva, has called and stated that the patient's incision site is draining--yellowish with a pink tinge. He has only had to re-dress the incision once. There is slight pain to the area and the incision is gapped open. He also states that the incision itself is slightly red however not warm to the touch.   Patient had laparoscopic cholecystectomy on 06/29/17 with Dr Aleen CampiPiscoya. Post op appointment was with Dr Tonita CongWoodham on 07/07/17 and the next post op appointment is 07/19/17 with Dr Aleen CampiPiscoya.   Patient's wife would like to know if they can apply peroxide to the area or if it should be checked sooner than 07/19/17.   Please call Carley Hammedva at [phone].

## 2017-07-16 NOTE — Telephone Encounter (Signed)
Call made to Juan Watson once again at this time. No answer. Unable to leave voicemail. If Juan Watson calls back in, I will be glad to speak with her.

## 2017-07-19 ENCOUNTER — Ambulatory Visit (INDEPENDENT_AMBULATORY_CARE_PROVIDER_SITE_OTHER): Payer: Medicare Other | Admitting: Surgery

## 2017-07-19 ENCOUNTER — Encounter: Payer: Self-pay | Admitting: Surgery

## 2017-07-19 VITALS — BP 134/73 | HR 64 | Temp 98.1°F | Ht 67.0 in | Wt 219.4 lb

## 2017-07-19 DIAGNOSIS — Z09 Encounter for follow-up examination after completed treatment for conditions other than malignant neoplasm: Secondary | ICD-10-CM

## 2017-07-19 NOTE — Progress Notes (Signed)
07/19/2017  HPI: Patient is status post laparoscopic cholecystectomy on 7/31. Presents today for follow-up. Reports that he's been doing well. He was seen on 8/8 by Dr. Tonita Cong his drain was removed at that point. Patient reports that he's been doing very well with no nausea or vomiting no significant pain and no fevers or chills.  Vital signs: BP 134/73   Pulse 64   Temp 98.1 F (36.7 C) (Oral)   Ht 5\' 7"  (1.702 m)   Wt 99.5 kg (219 lb 6.4 oz)   BMI 34.36 kg/m    Physical Exam: Constitutional: No acute distress Abdomen:  Soft, nondistended, nontender to palpation. Incisions are clean dry and intact.  Assessment/Plan: 66 year old male status post laparoscopic cholecystectomy.  -Instructed the patient he still has 1 more week of light lifting or pushing of more than 10-15 pounds. He may shower and submerge his wounds in pool or tub no as desired. -Patient did report that prior to be seen by Dr. Tonita Cong, he did feel that he he had a popping sensation at the umbilical incision. He may have indeed opened one of the sutures at the umbilical incision, and informed the patient that in the future he may develop a hernia at that incision. Patient is aware and understands of symptoms and signs to look out for. -Patient may follow-up with [place] on an as-needed basis.   Howie Ill, MD [facility]

## 2017-07-19 NOTE — Patient Instructions (Signed)
GENERAL POST-OPERATIVE PATIENT INSTRUCTIONS   WOUND CARE INSTRUCTIONS:  Keep a dry clean dressing on the wound if there is drainage. The initial bandage may be removed after 24 hours.  Once the wound has quit draining you may leave it open to air.  If clothing rubs against the wound or causes irritation and the wound is not draining you may cover it with a dry dressing during the daytime.  Try to keep the wound dry and avoid ointments on the wound unless directed to do so.  If the wound becomes bright red and painful or starts to drain infected material that is not clear, please contact your physician immediately.  If the wound is mildly pink and has a thick firm ridge underneath it, this is normal, and is referred to as a healing ridge.  This will resolve over the next 4-6 weeks.  BATHING: You may shower if you have been informed of this by your surgeon. However, Please do not submerge in a tub, hot tub, or pool until incisions are completely sealed or have been told by your surgeon that you may do so.  DIET:  You may eat any foods that you can tolerate.  It is a good idea to eat a high fiber diet and take in plenty of fluids to prevent constipation.  If you do become constipated you may want to take a mild laxative or take ducolax tablets on a daily basis until your bowel habits are regular.  Constipation can be very uncomfortable, along with straining, after recent surgery.  ACTIVITY:  You are encouraged to cough and deep breath or use your incentive spirometer if you were given one, every 15-30 minutes when awake.  This will help prevent respiratory complications and low grade fevers post-operatively if you had a general anesthetic.  You may want to hug a pillow when coughing and sneezing to add additional support to the surgical area, if you had abdominal or chest surgery, which will decrease pain during these times.  You are encouraged to walk and engage in light activity for the next two weeks.  You  should not lift more than 20 pounds, until 07/27/2017 as it could put you at increased risk for complications.  Twenty pounds is roughly equivalent to a plastic bag of groceries. At that time- Listen to your body when lifting, if you have pain when lifting, stop and then try again in a few days. Soreness after doing exercises or activities of daily living is normal as you get back in to your normal routine.  MEDICATIONS:  Try to take narcotic medications and anti-inflammatory medications, such as tylenol, ibuprofen, naprosyn, etc., with food.  This will minimize stomach upset from the medication.  Should you develop nausea and vomiting from the pain medication, or develop a rash, please discontinue the medication and contact your physician.  You should not drive, make important decisions, or operate machinery when taking narcotic pain medication.  SUNBLOCK Use sun block to incision area over the next year if this area will be exposed to sun. This helps decrease scarring and will allow you avoid a permanent darkened area over your incision.  QUESTIONS:  Please feel free to call our office if you have any questions, and we will be glad to assist you. [phone]    

## 2017-07-23 ENCOUNTER — Ambulatory Visit
Admission: RE | Admit: 2017-07-23 | Payer: Medicare Other | Source: Ambulatory Visit | Admitting: Unknown Physician Specialty

## 2017-07-23 ENCOUNTER — Encounter: Admission: RE | Payer: Self-pay | Source: Ambulatory Visit

## 2017-07-23 SURGERY — COLONOSCOPY WITH PROPOFOL
Anesthesia: General

## 2017-10-06 ENCOUNTER — Ambulatory Visit: Payer: Medicare Other | Admitting: Anesthesiology

## 2017-10-06 ENCOUNTER — Encounter: Payer: Self-pay | Admitting: *Deleted

## 2017-10-06 ENCOUNTER — Encounter
Admission: RE | Disposition: A | Discharge status: Home / Self Care | Payer: Self-pay | Source: Ambulatory Visit | Attending: Unknown Physician Specialty

## 2017-10-06 ENCOUNTER — Ambulatory Visit
Admission: RE | Admit: 2017-10-06 | Discharge: 2017-10-06 | Disposition: A | Discharge status: Home / Self Care | Payer: Medicare Other | Source: Ambulatory Visit | Attending: Unknown Physician Specialty | Admitting: Unknown Physician Specialty

## 2017-10-06 DIAGNOSIS — Z888 Allergy status to other drugs, medicaments and biological substances status: Secondary | ICD-10-CM | POA: Diagnosis not present

## 2017-10-06 DIAGNOSIS — M1990 Unspecified osteoarthritis, unspecified site: Secondary | ICD-10-CM | POA: Diagnosis not present

## 2017-10-06 DIAGNOSIS — K621 Rectal polyp: Secondary | ICD-10-CM | POA: Diagnosis not present

## 2017-10-06 DIAGNOSIS — M1710 Unilateral primary osteoarthritis, unspecified knee: Secondary | ICD-10-CM | POA: Diagnosis not present

## 2017-10-06 DIAGNOSIS — E119 Type 2 diabetes mellitus without complications: Secondary | ICD-10-CM | POA: Insufficient documentation

## 2017-10-06 DIAGNOSIS — K644 Residual hemorrhoidal skin tags: Secondary | ICD-10-CM | POA: Insufficient documentation

## 2017-10-06 DIAGNOSIS — Z8249 Family history of ischemic heart disease and other diseases of the circulatory system: Secondary | ICD-10-CM | POA: Diagnosis not present

## 2017-10-06 DIAGNOSIS — I719 Aortic aneurysm of unspecified site, without rupture: Secondary | ICD-10-CM | POA: Diagnosis not present

## 2017-10-06 DIAGNOSIS — H9190 Unspecified hearing loss, unspecified ear: Secondary | ICD-10-CM | POA: Insufficient documentation

## 2017-10-06 DIAGNOSIS — M109 Gout, unspecified: Secondary | ICD-10-CM | POA: Insufficient documentation

## 2017-10-06 DIAGNOSIS — Z91030 Bee allergy status: Secondary | ICD-10-CM | POA: Diagnosis not present

## 2017-10-06 DIAGNOSIS — I10 Essential (primary) hypertension: Secondary | ICD-10-CM | POA: Diagnosis not present

## 2017-10-06 DIAGNOSIS — Z808 Family history of malignant neoplasm of other organs or systems: Secondary | ICD-10-CM | POA: Insufficient documentation

## 2017-10-06 DIAGNOSIS — K219 Gastro-esophageal reflux disease without esophagitis: Secondary | ICD-10-CM | POA: Diagnosis not present

## 2017-10-06 DIAGNOSIS — E7800 Pure hypercholesterolemia, unspecified: Secondary | ICD-10-CM | POA: Insufficient documentation

## 2017-10-06 DIAGNOSIS — Z1211 Encounter for screening for malignant neoplasm of colon: Secondary | ICD-10-CM | POA: Diagnosis present

## 2017-10-06 DIAGNOSIS — N400 Enlarged prostate without lower urinary tract symptoms: Secondary | ICD-10-CM | POA: Diagnosis not present

## 2017-10-06 DIAGNOSIS — K5790 Diverticulosis of intestine, part unspecified, without perforation or abscess without bleeding: Secondary | ICD-10-CM | POA: Diagnosis not present

## 2017-10-06 DIAGNOSIS — Z79899 Other long term (current) drug therapy: Secondary | ICD-10-CM | POA: Insufficient documentation

## 2017-10-06 DIAGNOSIS — Z7984 Long term (current) use of oral hypoglycemic drugs: Secondary | ICD-10-CM | POA: Insufficient documentation

## 2017-10-06 DIAGNOSIS — Z882 Allergy status to sulfonamides status: Secondary | ICD-10-CM | POA: Diagnosis not present

## 2017-10-06 DIAGNOSIS — M1711 Unilateral primary osteoarthritis, right knee: Secondary | ICD-10-CM | POA: Insufficient documentation

## 2017-10-06 DIAGNOSIS — M2140 Flat foot [pes planus] (acquired), unspecified foot: Secondary | ICD-10-CM | POA: Insufficient documentation

## 2017-10-06 DIAGNOSIS — K640 First degree hemorrhoids: Secondary | ICD-10-CM | POA: Insufficient documentation

## 2017-10-06 HISTORY — PX: COLONOSCOPY WITH PROPOFOL: SHX5780

## 2017-10-06 LAB — GLUCOSE, CAPILLARY: GLUCOSE-CAPILLARY: 107 mg/dL — AB (ref 65–99)

## 2017-10-06 SURGERY — COLONOSCOPY WITH PROPOFOL
Anesthesia: General

## 2017-10-06 MED ORDER — MIDAZOLAM HCL 2 MG/2ML IJ SOLN
INTRAMUSCULAR | Status: AC
  Filled 2017-10-06: qty 2

## 2017-10-06 MED ORDER — SODIUM CHLORIDE 0.9 % IV SOLN
INTRAVENOUS | Status: DC
  Administered 2017-10-06: 1000 mL via INTRAVENOUS

## 2017-10-06 MED ORDER — PROPOFOL 10 MG/ML IV BOLUS
INTRAVENOUS | Status: DC | PRN
Start: 2017-10-06 — End: 2017-10-06
  Administered 2017-10-06: 40 mg via INTRAVENOUS

## 2017-10-06 MED ORDER — PIPERACILLIN-TAZOBACTAM 3.375 G IVPB 30 MIN
3.3750 g | Freq: Once | INTRAVENOUS | Status: AC
  Administered 2017-10-06: 3.375 g via INTRAVENOUS
  Filled 2017-10-06: qty 50

## 2017-10-06 MED ORDER — FENTANYL CITRATE (PF) 100 MCG/2ML IJ SOLN
INTRAMUSCULAR | Status: DC | PRN
  Administered 2017-10-06 (×2): 50 ug via INTRAVENOUS

## 2017-10-06 MED ORDER — PROPOFOL 500 MG/50ML IV EMUL
INTRAVENOUS | Status: DC | PRN
  Administered 2017-10-06: 75 ug/kg/min via INTRAVENOUS

## 2017-10-06 MED ORDER — PROPOFOL 10 MG/ML IV BOLUS
INTRAVENOUS | Status: AC
  Filled 2017-10-06: qty 20

## 2017-10-06 MED ORDER — SODIUM CHLORIDE 0.9 % IV SOLN
INTRAVENOUS | Status: DC
  Administered 2017-10-06: 10:00:00 via INTRAVENOUS

## 2017-10-06 MED ORDER — LIDOCAINE HCL (PF) 2 % IJ SOLN
INTRAMUSCULAR | Status: DC | PRN
  Administered 2017-10-06: 80 mg

## 2017-10-06 MED ORDER — MIDAZOLAM HCL 5 MG/5ML IJ SOLN
INTRAMUSCULAR | Status: DC | PRN
  Administered 2017-10-06: 2 mg via INTRAVENOUS

## 2017-10-06 MED ORDER — FENTANYL CITRATE (PF) 100 MCG/2ML IJ SOLN
INTRAMUSCULAR | Status: AC
  Filled 2017-10-06: qty 2

## 2017-10-06 MED ORDER — PIPERACILLIN-TAZOBACTAM 3.375 G IVPB
INTRAVENOUS | Status: AC
  Filled 2017-10-06: qty 50

## 2017-10-06 NOTE — Anesthesia Postprocedure Evaluation (Signed)
Anesthesia Post Note  Patient: Juan Watson  Procedure(s) Performed: COLONOSCOPY WITH PROPOFOL (N/A )  Patient location during evaluation: Endoscopy Anesthesia Type: General Level of consciousness: awake and alert Pain management: pain level controlled Vital Signs Assessment: post-procedure vital signs reviewed and stable Respiratory status: spontaneous breathing and respiratory function stable Cardiovascular status: stable Anesthetic complications: no     Last Vitals:  Vitals:   10/06/17 0927 10/06/17 1031  BP: (!) 163/67 114/84  Pulse: 60 (!) 59  Resp: (!) 24   Temp: (!) 36.3 C (!) 35.8 C  SpO2: 100% 97%    Last Pain:  Vitals:   10/06/17 1031  TempSrc: Tympanic                 Talor Cheema K

## 2017-10-06 NOTE — Anesthesia Post-op Follow-up Note (Signed)
Anesthesia QCDR form completed.        

## 2017-10-06 NOTE — Transfer of Care (Signed)
Immediate Anesthesia Transfer of Care Note  Patient: Juan FlorasClyde D Sjogren  Procedure(s) Performed: COLONOSCOPY WITH PROPOFOL (N/A )  Patient Location: PACU  Anesthesia Type:General  Level of Consciousness: sedated  Airway & Oxygen Therapy: Patient Spontanous Breathing and Patient connected to nasal cannula oxygen  Post-op Assessment: Report given to RN and Post -op Vital signs reviewed and stable  Post vital signs: Reviewed and stable  Last Vitals:  Vitals:   10/06/17 0927  BP: (!) 163/67  Pulse: 60  Resp: (!) 24  Temp: (!) 36.3 C  SpO2: 100%    Last Pain:  Vitals:   10/06/17 0927  TempSrc: Tympanic         Complications: No apparent anesthesia complications

## 2017-10-06 NOTE — H&P (Signed)
Primary Care Physician:  Coy Saunas, MD Primary Gastroenterologist:  Dr. Vira Agar  Pre-Procedure History & Physical: HPI:  Juan Watson is a 66 y.o. male is here for an colonoscopy.   Past Medical History:  Diagnosis Date  . Acquired flat foot 11/15/2015  . Arthritis   . Arthritis of knee, degenerative 04/15/2015  . Benign hypertension 10/17/2010  . BPH (benign prostatic hyperplasia)   . Cholecystitis 06/28/2017  . Controlled type 2 diabetes mellitus without complication ([place]) 1/53/7943   Overview:  Overview:  DIET CONTROLLED Overview:  Overview:  DIET CONTROLLED   . Diabetes mellitus without complication ([place])   . Diabetes mellitus, type 2 ([place]) 12/13/2002   Overview:  DIET CONTROLLED   . Disease of nail 07/16/2011  . Encounter for screening for malignant neoplasm of prostate 10/29/2014  . Essential (primary) hypertension 10/17/2010  . Familial aortic aneurysm 03/26/2014  . Family history of cardiovascular disease 03/26/2014  . Gastro-esophageal reflux disease without esophagitis 08/11/2013  . Generalized OA 08/11/2013  . GERD (gastroesophageal reflux disease)   . Gout 03/26/2014  . HOH (hard of hearing)    aids  . Hypercholesteremia 12/13/2002  . Hypercholesterolemia 12/13/2002  . Hypertension   . Primary osteoarthritis of knee 04/15/2015  . Primary osteoarthritis of right knee 04/14/2015  . Pure hypercholesterolemia 12/13/2002  . Skin lesion 03/11/2015  . Type 2 diabetes mellitus ([place]) 12/13/2002   Overview:  DIET CONTROLLED     Past Surgical History:  Procedure Laterality Date  . BACK SURGERY  [phone]  . CARDIAC CATHETERIZATION  70's   pericarditis  . HERNIA REPAIR  11,   umbilical x2  . KNEE DEBRIDEMENT Left    tendon  . TONSILLECTOMY  72  . TRANSURETHRAL RESECTION OF PROSTATE  ?    Prior to Admission medications   Medication Sig Start Date End Date Taking? Authorizing Provider  cloNIDine (CATAPRES) 0.1 MG tablet Take 0.1 mg by mouth 2 (two) times daily.  02/13/15   Yes [provider]  gemfibrozil (LOPID) 600 MG tablet Take 600 mg by mouth 2 (two) times daily. 01/29/15  Yes [provider]  losartan (COZAAR) 100 MG tablet Take 100 mg by mouth daily.   Yes [provider]  Omega-3 Fatty Acids (FISH OIL) 1000 MG CAPS Take by mouth.   Yes [provider]  ibuprofen (ADVIL,MOTRIN) 600 MG tablet Take 1 tablet (600 mg total) by mouth every 8 (eight) hours as needed. 07/01/17   Olean Ree, MD  metFORMIN (GLUCOPHAGE) 500 MG tablet Take 500 mg by mouth 2 (two) times daily.  02/25/15   [provider]  omeprazole (PRILOSEC OTC) 20 MG tablet TAKE ONE CAPSULE BY MOUTH EVERY DAY 01/21/16   [provider]  omeprazole (PRILOSEC) 20 MG capsule Take 20 mg by mouth daily.    [provider]    Allergies as of 07/26/2017 - Review Complete 07/19/2017  Allergen Reaction Noted  . Bee venom Swelling 04/05/2015  . Statins Other (See Comments) 04/15/2015  . Sulfa antibiotics Swelling 04/05/2015    Family History  Problem Relation Age of Onset  . Multiple myeloma Mother   . Aneurysm Father   . Aneurysm Brother   . Nephrolithiasis Neg Hx   . Bladder Cancer Neg Hx   . Prostate cancer Neg Hx   . Kidney cancer Neg Hx     Social History   Socioeconomic History  . Marital status: Married    Spouse name: Not on file  .  Number of children: Not on file  . Years of education: Not on file  . Highest education level: Not on file  Social Needs  . Financial resource strain: Not on file  . Food insecurity - worry: Not on file  . Food insecurity - inability: Not on file  . Transportation needs - medical: Not on file  . Transportation needs - non-medical: Not on file  Occupational History  . Not on file  Tobacco Use  . Smoking status: Former Smoker    Packs/day: 2.00    Years: 25.00    Pack years: 50.00    Types: Cigarettes    Last attempt to quit: 07/15/1992    Years since quitting: 25.2  . Smokeless  tobacco: Current User    Types: Chew  Substance and Sexual Activity  . Alcohol use: No  . Drug use: No  . Sexual activity: Not on file  Other Topics Concern  . Not on file  Social History Narrative  . Not on file    Review of Systems: See HPI, otherwise negative ROS  Physical Exam: BP (!) 163/67   Pulse 60   Temp (!) 97.3 F (36.3 C) (Tympanic)   Resp (!) 24   Ht 5' 7"  (1.702 m)   Wt 98.4 kg (217 lb)   SpO2 100%   BMI 33.99 kg/m  General:   Alert,  pleasant and cooperative in NAD Head:  Normocephalic and atraumatic. Neck:  Supple; no masses or thyromegaly. Lungs:  Clear throughout to auscultation.    Heart:  Regular rate and rhythm. Abdomen:  Soft, nontender and nondistended. Normal bowel sounds, without guarding, and without rebound.   Neurologic:  Alert and  oriented x4;  grossly normal neurologically.  Impression/Plan: Juan Watson is here for an colonoscopy to be performed for screening colonoscopy  Risks, benefits, limitations, and alternatives regarding  colonoscopy have been reviewed with the patient.  Questions have been answered.  All parties agreeable.   Gaylyn Cheers, MD  10/06/2017, 9:52 AM

## 2017-10-06 NOTE — Op Note (Addendum)
[facility] Gastroenterology Patient Name: Quincy CarnesClyde Nield Procedure Date: 10/06/2017 9:41 AM MRN: [id number] Account #: [id number] Date of Birth: [birth year] Admit Type: Outpatient Age: [age] [facility] 3 Gender: Male Note Status: Finalized Procedure:            Colonoscopy Indications:          Screening for colorectal malignant neoplasm Providers:            Scot Junobert T. , MD Medicines:            Propofol per Anesthesia Complications:        No immediate complications. Procedure:            Pre-Anesthesia Assessment:                       - After reviewing the risks and benefits, the patient                        was deemed in satisfactory condition to undergo the                        procedure.                       After obtaining informed consent, the colonoscope was                        passed under direct vision. Throughout the procedure,                        the patient's blood pressure, pulse, and oxygen                        saturations were monitored continuously. The                        Colonoscope was introduced through the anus and                        advanced to the the cecum, identified by appendiceal                        orifice and ileocecal valve. The colonoscopy was                        performed without difficulty. The patient tolerated the                        procedure well. The quality of the bowel preparation                        was excellent. Findings:      A small polyp was found in the rectum. The polyp was sessile. The polyp       was removed with a hot snare. Resection and retrieval were complete.      External and internal hemorrhoids were found during endoscopy. The       hemorrhoids were small and Grade I (internal hemorrhoids that do not       prolapse).      The exam was otherwise without abnormality. Impression:           -  One small polyp in the rectum, removed with a hot    snare. Resected and retrieved.                       - External and internal hemorrhoids.                       - The examination was otherwise normal. Recommendation:       - Await pathology results. Scot Junobert T , MD 10/06/2017 10:31:10 AM This report has been signed electronically. Number of Addenda: 0 Note Initiated On: 10/06/2017 9:41 AM Scope Withdrawal Time: 0 hours 15 minutes 16 seconds  Total Procedure Duration: 0 hours 23 minutes 30 seconds       [facility]

## 2017-10-06 NOTE — Anesthesia Preprocedure Evaluation (Signed)
Anesthesia Evaluation  Patient identified by MRN, date of birth, ID band Patient awake    Reviewed: Allergy & Precautions, NPO status , Patient's Chart, lab work & pertinent test results  History of Anesthesia Complications Negative for: history of anesthetic complications  Airway Mallampati: II       Dental  (+) Upper Dentures, Lower Dentures   Pulmonary neg sleep apnea, neg COPD, former smoker,           Cardiovascular hypertension, Pt. on medications (-) Past MI and (-) CHF (-) dysrhythmias + Valvular Problems/Murmurs (murmur, no tx)      Neuro/Psych neg Seizures    GI/Hepatic Neg liver ROS, GERD  Medicated and Controlled,  Endo/Other  diabetes, Type 2, Oral Hypoglycemic Agents  Renal/GU negative Renal ROS     Musculoskeletal   Abdominal   Peds  Hematology   Anesthesia Other Findings   Reproductive/Obstetrics                             Anesthesia Physical Anesthesia Plan  ASA: III  Anesthesia Plan: General   Post-op Pain Management:    Induction: Intravenous  PONV Risk Score and Plan: 2 and Propofol infusion and Treatment may vary due to age or medical condition  Airway Management Planned: Nasal Cannula  Additional Equipment:   Intra-op Plan:   Post-operative Plan:   Informed Consent: I have reviewed the patients History and Physical, chart, labs and discussed the procedure including the risks, benefits and alternatives for the proposed anesthesia with the patient or authorized representative who has indicated his/her understanding and acceptance.     Plan Discussed with:   Anesthesia Plan Comments:         Anesthesia Quick Evaluation

## 2017-10-07 ENCOUNTER — Encounter: Payer: Self-pay | Admitting: Unknown Physician Specialty

## 2017-10-07 LAB — SURGICAL PATHOLOGY

## 2017-11-01 DIAGNOSIS — M76891 Other specified enthesopathies of right lower limb, excluding foot: Secondary | ICD-10-CM | POA: Insufficient documentation

## 2017-11-01 HISTORY — DX: Other specified enthesopathies of right lower limb, excluding foot: M76.891

## 2017-12-13 ENCOUNTER — Encounter
Admission: RE | Admit: 2017-12-13 | Discharge: 2017-12-13 | Disposition: A | Discharge status: Home / Self Care | Payer: Medicare Other | Source: Ambulatory Visit | Attending: Orthopedic Surgery | Admitting: Orthopedic Surgery

## 2017-12-13 ENCOUNTER — Other Ambulatory Visit: Payer: Self-pay

## 2017-12-13 DIAGNOSIS — Z01812 Encounter for preprocedural laboratory examination: Secondary | ICD-10-CM | POA: Insufficient documentation

## 2017-12-13 HISTORY — DX: Cardiac murmur, unspecified: R01.1

## 2017-12-13 LAB — SURGICAL PCR SCREEN
MRSA, PCR: NEGATIVE
Staphylococcus aureus: NEGATIVE

## 2017-12-13 LAB — C-REACTIVE PROTEIN: CRP: 1.8 mg/dL — AB (ref <1.0)

## 2017-12-13 LAB — HEMOGLOBIN A1C
HEMOGLOBIN A1C: 5.8 % — AB (ref 4.8–5.6)
Mean Plasma Glucose: 119.76 mg/dL

## 2017-12-13 LAB — SEDIMENTATION RATE: Sed Rate: 6 mm/hr (ref 0–20)

## 2017-12-13 NOTE — Patient Instructions (Addendum)
  Your procedure is scheduled ZH:YQMVHQon:Monday Jan. 21 , 2019. Report to Same Day Surgery. To find out your arrival time please call [phone] between 1PM - 3PM on Friday Jan. 18, 2019 .  Remember: Instructions that are not followed completely may result in serious medical risk, up to and including death, or upon the discretion of your surgeon and anesthesiologist your surgery may need to be rescheduled.    _x___ 1. Do not eat food after midnight night prior to surgery.     No gum chewing or hard candies, snacks or breakfast.     May drink the following up until 2 hours prior to arrival at hospital:      water      ____ 2. No Alcohol for 24 hours before or after surgery.   ____ 3. Bring all medications with you on the day of surgery if instructed.    __x__ 4. Notify your doctor if there is any change in your medical condition     (cold, fever, infections).    __x`___ 5.   Do Not Smoke or use e-cigarettes For 24 Hours Prior to Your   Surgery.  Do not use any chewable tobacco products for at least 6   hours prior to  surgery.                  Do not wear jewelry, make-up, hairpins, clips or nail polish.  Do not wear lotions, powders, or perfumes.   Do not shave 48 hours prior to surgery. Men may shave face and neck.  Do not bring valuables to the hospital.    [facility] is not responsible for any belongings or valuables.               Contacts, dentures or bridgework may not be worn into surgery.  Leave your suitcase in the car. After surgery it may be brought to your room.  For patients admitted to the hospital, discharge time is determined by your  treatment team.   Patients discharged the day of surgery will not be allowed to drive home.    Please read over the following fact sheets that you were given:   [facility] Preparing for [facility]             _x___ Take these medicines the morning of surgery with A SIP OF WATER:    1. Amlodipine/Norvasc  2.  Clonidine/Catapress  3. Gemfibrozil/Lopid  4. Labetalol/Normodyne  5. Omperazole/Prilosec take at bedtime the night prior to surgery and am of surgery.       ____ Fleet Enema (as directed)   _x___ Use CHG Soap as directed on instruction sheet  ____ Use inhalers on the day of surgery and bring to hospital day of surgery  __x__ Stop metformin 2 days prior to surgery    ____ Take 1/2 of usual insulin dose the night before surgery and none on the morning of          surgery.   __x__ Stop aspirin 7 days prior to surgery.  _x___ Stop Anti-inflammatories such as Advil, Aleve, diclofenac, Ibuprofen, Motrin, Naproxen,  Naprosyn, Goodies powders or aspirin products. OK to take Tylenol.   _x___ Stop supplements: fish oil until after surgery.    ____ Bring C-Pap to the hospital.

## 2017-12-16 DIAGNOSIS — T8482XA Fibrosis due to internal orthopedic prosthetic devices, implants and grafts, initial encounter: Secondary | ICD-10-CM | POA: Insufficient documentation

## 2017-12-19 MED ORDER — CEFAZOLIN SODIUM-DEXTROSE 2-4 GM/100ML-% IV SOLN
2.0000 g | INTRAVENOUS | Status: AC
  Administered 2017-12-20: 2 g via INTRAVENOUS

## 2017-12-20 ENCOUNTER — Ambulatory Visit
Admission: RE | Admit: 2017-12-20 | Discharge: 2017-12-20 | Disposition: A | Discharge status: Home / Self Care | Payer: Medicare Other | Source: Ambulatory Visit | Attending: Orthopedic Surgery | Admitting: Orthopedic Surgery

## 2017-12-20 ENCOUNTER — Encounter
Admission: RE | Disposition: A | Discharge status: Home / Self Care | Payer: Self-pay | Source: Ambulatory Visit | Attending: Orthopedic Surgery

## 2017-12-20 ENCOUNTER — Ambulatory Visit: Payer: Medicare Other | Admitting: Certified Registered Nurse Anesthetist

## 2017-12-20 DIAGNOSIS — Z7984 Long term (current) use of oral hypoglycemic drugs: Secondary | ICD-10-CM | POA: Diagnosis not present

## 2017-12-20 DIAGNOSIS — I10 Essential (primary) hypertension: Secondary | ICD-10-CM | POA: Diagnosis not present

## 2017-12-20 DIAGNOSIS — E119 Type 2 diabetes mellitus without complications: Secondary | ICD-10-CM | POA: Diagnosis not present

## 2017-12-20 DIAGNOSIS — Z96651 Presence of right artificial knee joint: Secondary | ICD-10-CM | POA: Insufficient documentation

## 2017-12-20 DIAGNOSIS — K219 Gastro-esophageal reflux disease without esophagitis: Secondary | ICD-10-CM | POA: Insufficient documentation

## 2017-12-20 DIAGNOSIS — E7800 Pure hypercholesterolemia, unspecified: Secondary | ICD-10-CM | POA: Insufficient documentation

## 2017-12-20 DIAGNOSIS — M24661 Ankylosis, right knee: Secondary | ICD-10-CM | POA: Insufficient documentation

## 2017-12-20 DIAGNOSIS — Z7982 Long term (current) use of aspirin: Secondary | ICD-10-CM | POA: Insufficient documentation

## 2017-12-20 DIAGNOSIS — Z79899 Other long term (current) drug therapy: Secondary | ICD-10-CM | POA: Insufficient documentation

## 2017-12-20 HISTORY — PX: KNEE ARTHROSCOPY: SHX127

## 2017-12-20 LAB — GLUCOSE, CAPILLARY
GLUCOSE-CAPILLARY: 109 mg/dL — AB (ref 65–99)
GLUCOSE-CAPILLARY: 151 mg/dL — AB (ref 65–99)

## 2017-12-20 SURGERY — ARTHROSCOPY, KNEE
Anesthesia: General | Site: Knee | Laterality: Right | Wound class: Clean

## 2017-12-20 MED ORDER — FENTANYL CITRATE (PF) 100 MCG/2ML IJ SOLN
INTRAMUSCULAR | Status: AC
Start: 2017-12-20 — End: ?
  Filled 2017-12-20: qty 2

## 2017-12-20 MED ORDER — FENTANYL CITRATE (PF) 100 MCG/2ML IJ SOLN: 25.0000 ug | INTRAMUSCULAR | Status: DC | PRN

## 2017-12-20 MED ORDER — BUPIVACAINE-EPINEPHRINE 0.25% -1:200000 IJ SOLN
INTRAMUSCULAR | Status: DC | PRN
  Administered 2017-12-20: 30 mL

## 2017-12-20 MED ORDER — CELECOXIB 200 MG PO CAPS: 400.0000 mg | ORAL_CAPSULE | Freq: Once | ORAL | Status: DC

## 2017-12-20 MED ORDER — LIDOCAINE HCL (PF) 2 % IJ SOLN
INTRAMUSCULAR | Status: AC
  Filled 2017-12-20: qty 10

## 2017-12-20 MED ORDER — DEXAMETHASONE SODIUM PHOSPHATE 10 MG/ML IJ SOLN: 8.0000 mg | Freq: Once | INTRAMUSCULAR | Status: DC

## 2017-12-20 MED ORDER — DEXAMETHASONE SODIUM PHOSPHATE 10 MG/ML IJ SOLN
INTRAMUSCULAR | Status: AC
  Filled 2017-12-20: qty 1

## 2017-12-20 MED ORDER — METOCLOPRAMIDE HCL 10 MG PO TABS: 5.0000 mg | ORAL_TABLET | Freq: Three times a day (TID) | ORAL | Status: DC | PRN

## 2017-12-20 MED ORDER — SODIUM CHLORIDE 0.9 % IV BOLUS (SEPSIS)
500.0000 mL | Freq: Once | INTRAVENOUS | Status: AC
  Administered 2017-12-20: 500 mL via INTRAVENOUS

## 2017-12-20 MED ORDER — SEVOFLURANE IN SOLN
RESPIRATORY_TRACT | Status: AC
  Filled 2017-12-20: qty 250

## 2017-12-20 MED ORDER — ACETAMINOPHEN 10 MG/ML IV SOLN
INTRAVENOUS | Status: DC | PRN
  Administered 2017-12-20 (×2): 1000 mg via INTRAVENOUS

## 2017-12-20 MED ORDER — MORPHINE SULFATE 4 MG/ML IJ SOLN
INTRAMUSCULAR | Status: DC | PRN
  Administered 2017-12-20: 4 mg

## 2017-12-20 MED ORDER — BUPIVACAINE-EPINEPHRINE (PF) 0.25% -1:200000 IJ SOLN
INTRAMUSCULAR | Status: AC
  Filled 2017-12-20: qty 30

## 2017-12-20 MED ORDER — ONDANSETRON HCL 4 MG/2ML IJ SOLN
INTRAMUSCULAR | Status: AC
  Filled 2017-12-20: qty 2

## 2017-12-20 MED ORDER — CHLORHEXIDINE GLUCONATE 4 % EX LIQD: 60.0000 mL | Freq: Once | CUTANEOUS | Status: DC

## 2017-12-20 MED ORDER — CEFAZOLIN SODIUM-DEXTROSE 2-4 GM/100ML-% IV SOLN
INTRAVENOUS | Status: AC
  Filled 2017-12-20: qty 100

## 2017-12-20 MED ORDER — MORPHINE SULFATE (PF) 4 MG/ML IV SOLN
INTRAVENOUS | Status: AC
  Filled 2017-12-20: qty 1

## 2017-12-20 MED ORDER — DEXAMETHASONE SODIUM PHOSPHATE 10 MG/ML IJ SOLN: INTRAMUSCULAR | Status: DC | PRN

## 2017-12-20 MED ORDER — ONDANSETRON HCL 4 MG PO TABS: 4.0000 mg | ORAL_TABLET | Freq: Four times a day (QID) | ORAL | Status: DC | PRN

## 2017-12-20 MED ORDER — PROPOFOL 10 MG/ML IV BOLUS
INTRAVENOUS | Status: AC
  Filled 2017-12-20: qty 20

## 2017-12-20 MED ORDER — ONDANSETRON HCL 4 MG/2ML IJ SOLN
INTRAMUSCULAR | Status: DC | PRN
  Administered 2017-12-20: 4 mg via INTRAVENOUS

## 2017-12-20 MED ORDER — FENTANYL CITRATE (PF) 100 MCG/2ML IJ SOLN
INTRAMUSCULAR | Status: DC | PRN
  Administered 2017-12-20 (×2): 25 ug via INTRAVENOUS
  Administered 2017-12-20: 50 ug via INTRAVENOUS

## 2017-12-20 MED ORDER — EPHEDRINE SULFATE 50 MG/ML IJ SOLN
INTRAMUSCULAR | Status: DC | PRN
  Administered 2017-12-20: 10 mg via INTRAVENOUS
  Administered 2017-12-20: 5 mg via INTRAVENOUS
  Administered 2017-12-20 (×2): 10 mg via INTRAVENOUS

## 2017-12-20 MED ORDER — SODIUM CHLORIDE 0.9 % IV SOLN: INTRAVENOUS | Status: DC

## 2017-12-20 MED ORDER — PROPOFOL 10 MG/ML IV BOLUS
INTRAVENOUS | Status: DC | PRN
  Administered 2017-12-20: 180 mg via INTRAVENOUS

## 2017-12-20 MED ORDER — MIDAZOLAM HCL 2 MG/2ML IJ SOLN
INTRAMUSCULAR | Status: DC | PRN
  Administered 2017-12-20 (×2): 1 mg via INTRAVENOUS

## 2017-12-20 MED ORDER — METOCLOPRAMIDE HCL 5 MG/ML IJ SOLN: 5.0000 mg | Freq: Three times a day (TID) | INTRAMUSCULAR | Status: DC | PRN

## 2017-12-20 MED ORDER — GABAPENTIN 300 MG PO CAPS
300.0000 mg | ORAL_CAPSULE | Freq: Once | ORAL | Status: AC
  Administered 2017-12-20: 300 mg via ORAL

## 2017-12-20 MED ORDER — ONDANSETRON HCL 4 MG/2ML IJ SOLN: 4.0000 mg | Freq: Once | INTRAMUSCULAR | Status: DC | PRN

## 2017-12-20 MED ORDER — LIDOCAINE HCL (CARDIAC) 20 MG/ML IV SOLN
INTRAVENOUS | Status: DC | PRN
  Administered 2017-12-20: 100 mg via INTRAVENOUS

## 2017-12-20 MED ORDER — ONDANSETRON HCL 4 MG/2ML IJ SOLN: 4.0000 mg | Freq: Four times a day (QID) | INTRAMUSCULAR | Status: DC | PRN

## 2017-12-20 MED ORDER — GABAPENTIN 300 MG PO CAPS
ORAL_CAPSULE | ORAL | Status: AC
  Administered 2017-12-20: 300 mg via ORAL
  Filled 2017-12-20: qty 1

## 2017-12-20 MED ORDER — MIDAZOLAM HCL 2 MG/2ML IJ SOLN
INTRAMUSCULAR | Status: AC
  Filled 2017-12-20: qty 2

## 2017-12-20 MED ORDER — SODIUM CHLORIDE 0.9 % IV SOLN
INTRAVENOUS | Status: DC
  Administered 2017-12-20: 15:00:00 via INTRAVENOUS

## 2017-12-20 MED ORDER — HYDROCODONE-ACETAMINOPHEN 5-325 MG PO TABS: 1.0000 | ORAL_TABLET | ORAL | Status: DC | PRN

## 2017-12-20 MED ORDER — HYDROCODONE-ACETAMINOPHEN 5-325 MG PO TABS: 1.0000 | ORAL_TABLET | ORAL | 0 refills | Status: DC | PRN

## 2017-12-20 MED ORDER — ACETAMINOPHEN 10 MG/ML IV SOLN
INTRAVENOUS | Status: AC
  Filled 2017-12-20: qty 100

## 2017-12-20 SURGICAL SUPPLY — 23 items
BLADE SHAVER 4.5 DBL SERAT CV (CUTTER) ×3 IMPLANT
CUFF TOURN 24 STER (MISCELLANEOUS) IMPLANT
CUFF TOURN 30 STER DUAL PORT (MISCELLANEOUS) ×3 IMPLANT
DRSG DERMACEA 8X12 NADH (GAUZE/BANDAGES/DRESSINGS) ×3 IMPLANT
DURAPREP 26ML APPLICATOR (WOUND CARE) ×6 IMPLANT
GAUZE SPONGE 4X4 12PLY STRL (GAUZE/BANDAGES/DRESSINGS) ×3 IMPLANT
GLOVE BIOGEL M STRL SZ7.5 (GLOVE) ×3 IMPLANT
GLOVE INDICATOR 8.0 STRL GRN (GLOVE) ×3 IMPLANT
GOWN STRL REUS W/ TWL LRG LVL3 (GOWN DISPOSABLE) ×2 IMPLANT
GOWN STRL REUS W/TWL LRG LVL3 (GOWN DISPOSABLE) ×4
IV LACTATED RINGER IRRG 3000ML (IV SOLUTION) ×40
IV LR IRRIG 3000ML ARTHROMATIC (IV SOLUTION) ×20 IMPLANT
KIT RM TURNOVER STRD PROC AR (KITS) ×3 IMPLANT
MANIFOLD NEPTUNE II (INSTRUMENTS) ×9 IMPLANT
PACK ARTHROSCOPY KNEE (MISCELLANEOUS) ×3 IMPLANT
SET TUBE SUCT SHAVER OUTFL 24K (TUBING) ×3 IMPLANT
SET TUBE TIP INTRA-ARTICULAR (MISCELLANEOUS) ×3 IMPLANT
STOCKINETTE BIAS CUT 6 980064 (GAUZE/BANDAGES/DRESSINGS) ×3 IMPLANT
SUT ETHILON 3-0 FS-10 30 BLK (SUTURE) ×3
SUTURE EHLN 3-0 FS-10 30 BLK (SUTURE) ×1 IMPLANT
TUBING ARTHRO INFLOW-ONLY STRL (TUBING) ×3 IMPLANT
WAND HAND CNTRL MULTIVAC 50 (MISCELLANEOUS) ×6 IMPLANT
WRAP KNEE W/COLD PACKS 25.5X14 (SOFTGOODS) ×3 IMPLANT

## 2017-12-20 NOTE — Anesthesia Postprocedure Evaluation (Signed)
Anesthesia Post Note  Patient: Juan Watson  Procedure(s) Performed: ARTHROSCOPY KNEE EXTENSIVE SYNOVECTOMY AND LYSIS OF ADHESIONS (Right Knee)  Patient location during evaluation: PACU Anesthesia Type: General Level of consciousness: awake and alert Pain management: pain level controlled Vital Signs Assessment: post-procedure vital signs reviewed and stable Respiratory status: spontaneous breathing, nonlabored ventilation, respiratory function stable and patient connected to nasal cannula oxygen Cardiovascular status: blood pressure returned to baseline and stable Postop Assessment: no apparent nausea or vomiting Anesthetic complications: no     Last Vitals:  Vitals:   12/20/17 1940 12/20/17 2003  BP: (!) 163/73 (!) 161/66  Pulse: (!) 57 (!) 53  Resp: 16 14  Temp: (!) 36.3 C   SpO2: 98% 100%    Last Pain:  Vitals:   12/20/17 1940  TempSrc: Temporal  PainSc: 0-No pain                 Cleda MccreedyJoseph K Shye Doty

## 2017-12-20 NOTE — Anesthesia Post-op Follow-up Note (Signed)
Anesthesia QCDR form completed.        

## 2017-12-20 NOTE — H&P (Signed)
The patient has been re-examined, and the chart reviewed, and there have been no interval changes to the documented history and physical.    The risks, benefits, and alternatives have been discussed at length. The patient expressed understanding of the risks benefits and agreed with plans for surgical intervention.  Juan Watson, Jr. M.D.    

## 2017-12-20 NOTE — Op Note (Signed)
OPERATIVE NOTE  DATE OF SURGERY:  12/20/2017  PATIENT NAME:  Juan Watson   DOB: Jun 13, 1951  MRN: [id number]   PRE-OPERATIVE DIAGNOSIS: Arthrofibrosis of the right knee status post right total knee arthroplasty  POST-OPERATIVE DIAGNOSIS: Same  PROCEDURE:  Right knee arthroscopy, extensive synovectomy  SURGEON:  Jena GaussJames P Hooten, Jr., M.D.   ASSISTANT: none  ANESTHESIA: general  ESTIMATED BLOOD LOSS: Minimal  FLUIDS REPLACED: 900 mL of crystalloid  TOURNIQUET TIME: Not used  DRAINS: none  IMPLANTS UTILIZED: None  INDICATIONS FOR SURGERY: Juan Watson is a 67 y.o. year old male who previously underwent a right total knee arthroplasty approximately 2 and half years ago in [place].  The patient has had limited motion and pain with attempted range of motion.  Findings were consistent with arthrofibrosis. After discussion of the risks and benefits of surgical intervention, the patient expressed understanding of the risks benefits and agree with plans for right knee arthroscopy.   PROCEDURE IN DETAIL: The patient was brought into the operating room and, after adequate general anesthesia was achieved, a tourniquet was applied to the right thigh and the leg was placed in the leg holder. All bony prominences were well padded. The patient's right knee was cleaned and prepped with alcohol and Duraprep and draped in the usual sterile fashion. A "timeout" was performed as per usual protocol. The anticipated portal sites were injected with 0.25% Marcaine with epinephrine. An anterolateral incision was made and a cannula was inserted. A moderate effusion was evacuated and the knee was distended with fluid using the pump. The scope was positioned supposed to visualize the total knee implants.  Abundant fibrotic tissue was encountered to both the medial and lateral gutters as well as along the anterior aspect of the knee. Under visualization with the scope, an anteromedial portal was created.  A  systematic debridement of the fibrotic tissue and synovectomy was initiated using the 50 degree ArthroCare wand.  Treatment was initiated along the anteromedial aspect of the joint and extending along the medial gutter.  Significant fibrotic tissue was noted around the patella.  This was also debrided using the ArthroCare wand and a 4.5 mm incisor shaver.  The knee was then extended and fibrotic bands in the suprapatellar bout pouch were debrided using combination of the incisor shaver and the ArthroCare wand.  Synovium around the periphery of the patellar component was debrided using the ArthroCare wand.  Good patellar tracking was subsequently appreciated.  Next, the scope was removed from the anterolateral portal resurveyed the anteromedial portal so as to better visualize the lateral aspect of the knee.  In a similar fashion, the ArthroCare wand was used to debride the fibrotic tissue and hypertrophied synovium along the anterolateral aspect of the joint and into the lateral gutter.  Hemostasis was achieved using the ArthroCare wand.  Visualization of the components showed no gross issues.  The polyethylene to both the patella and 2 the polyethylene insert were felt to be in excellent condition.  The knee was irrigated with copius amounts of fluid and suctioned dry. The anterolateral portal was re-approximated with #3-0 nylon. A combination of 0.25% Marcaine with epinephrine and 4 mg of Morphine were injected via the scope. The scope was removed and the anteromedial portal was re-approximated with #3-0 nylon. A sterile dressing was applied followed by application of an ice wrap.  The patient tolerated the procedure well and was transported to the PACU in stable condition.  James P. Angie FavaHooten, Jr., M.D.

## 2017-12-20 NOTE — Anesthesia Preprocedure Evaluation (Signed)
Anesthesia Evaluation  Patient identified by MRN, date of birth, ID band Patient awake    Reviewed: Allergy & Precautions, NPO status , Patient's Chart, lab work & pertinent test results  History of Anesthesia Complications Negative for: history of anesthetic complications  Airway Mallampati: II       Dental   Pulmonary neg sleep apnea, neg COPD, former smoker,           Cardiovascular hypertension, Pt. on medications (-) Past MI and (-) CHF (-) dysrhythmias + Valvular Problems/Murmurs (murmur, no tx)      Neuro/Psych neg Seizures    GI/Hepatic Neg liver ROS, GERD  Medicated and Controlled,  Endo/Other  diabetes, Type 2, Oral Hypoglycemic Agents  Renal/GU negative Renal ROS     Musculoskeletal   Abdominal   Peds  Hematology   Anesthesia Other Findings   Reproductive/Obstetrics                             Anesthesia Physical Anesthesia Plan  ASA: III  Anesthesia Plan: General   Post-op Pain Management:    Induction: Intravenous  PONV Risk Score and Plan: 2 and Dexamethasone and Ondansetron  Airway Management Planned: LMA  Additional Equipment:   Intra-op Plan:   Post-operative Plan:   Informed Consent: I have reviewed the patients History and Physical, chart, labs and discussed the procedure including the risks, benefits and alternatives for the proposed anesthesia with the patient or authorized representative who has indicated his/her understanding and acceptance.     Plan Discussed with:   Anesthesia Plan Comments:         Anesthesia Quick Evaluation

## 2017-12-20 NOTE — Transfer of Care (Signed)
Immediate Anesthesia Transfer of Care Note  Patient: Juan FlorasClyde D Siddall  Procedure(s) Performed: ARTHROSCOPY KNEE EXTENSIVE SYNOVECTOMY AND LYSIS OF ADHESIONS (Right Knee)  Patient Location: PACU  Anesthesia Type:General  Level of Consciousness: sedated and patient cooperative  Airway & Oxygen Therapy: Patient Spontanous Breathing and Patient connected to face mask oxygen  Post-op Assessment: Report given to RN and Post -op Vital signs reviewed and unstable, Anesthesiologist notified  Post vital signs: Reviewed and stable  Last Vitals:  Vitals:   12/20/17 1430 12/20/17 1900  BP: (!) 167/81 (!) (P) 87/49  Pulse: (!) 56   Resp: 17   Temp: 36.5 C (!) (P) 36.2 C  SpO2: 99% (P) 96%    Last Pain:  Vitals:   12/20/17 1430  TempSrc: Oral         Complications: No apparent anesthesia complications

## 2017-12-20 NOTE — Anesthesia Procedure Notes (Signed)
Procedure Name: LMA Insertion Date/Time: 12/20/2017 4:06 PM Performed by: Ginger CarneMichelet, Lorene Samaan, CRNA Pre-anesthesia Checklist: Patient identified, Emergency Drugs available, Suction available, Patient being monitored and Timeout performed Patient Re-evaluated:Patient Re-evaluated prior to induction Oxygen Delivery Method: Circle system utilized Preoxygenation: Pre-oxygenation with 100% oxygen Induction Type: IV induction LMA: LMA inserted LMA Size: 5.0 Tube type: Oral Number of attempts: 1 Placement Confirmation: breath sounds checked- equal and bilateral Tube secured with: Tape Dental Injury: Teeth and Oropharynx as per pre-operative assessment

## 2017-12-20 NOTE — Discharge Instructions (Signed)
Instructions after Knee Arthroscopy    James P. Angie FavaHooten, Jr., M.D.     Dept. of Orthopaedics & Sports Medicine  [facility]  [address]  [place], [place]  [id number]   Phone: [phone]   Fax: [phone]   DIET:  Drink plenty of non-alcoholic fluids & begin a light diet.  Resume your normal diet the day after surgery.  ACTIVITY:   You may use crutches or a walker with weight-bearing as tolerated, unless instructed otherwise.  You may wean yourself off of the walker or crutches as tolerated.   Begin doing gentle exercises. Exercising will reduce the pain and swelling, increase motion, and prevent muscle weakness.    Avoid strenuous activities or athletics for a minimum of 4-6 weeks after arthroscopic surgery.  Do not drive or operate any equipment until instructed.  WOUND CARE:   Place one to two pillows under the knee the first day or two when sitting or lying.   Continue to use the ice packs periodically to reduce pain and swelling.  The small incisions in your knee are closed with nylon stitches. The stitches will be removed in the office.  The bulky dressing may be removed on the second day after surgery. DO NOT TOUCH THE STITCHES. Put a Band-Aid over each stitch. Do NOT use any ointments or creams on the incisions.   You may bathe or shower after the stitches are removed at the first office visit following surgery.  MEDICATIONS:  You may resume your regular medications.  Please take the pain medication as prescribed.  Do not take pain medication on an empty stomach.  Do not drive or drink alcoholic beverages when taking pain medications.  CALL THE OFFICE FOR:  Temperature above 101 degrees  Excessive bleeding or drainage on the dressing.  Excessive swelling, coldness, or paleness of the toes.  Persistent nausea and vomiting.  FOLLOW-UP:   You should have an appointment to return to the office in 7-10 days after surgery.    General  Anesthesia, Adult, Care After These instructions provide you with information about caring for yourself after your procedure. Your health care provider may also give you more specific instructions. Your treatment has been planned according to current medical practices, but problems sometimes occur. Call your health care provider if you have any problems or questions after your procedure. What can I expect after the procedure? After the procedure, it is common to have:  Vomiting.  A sore throat.  Mental slowness.  It is common to feel:  Nauseous.  Cold or shivery.  Sleepy.  Tired.  Sore or achy, even in parts of your body where you did not have surgery.  Follow these instructions at home: For at least 24 hours after the procedure:  Do not: ? Participate in activities where you could fall or become injured. ? Drive. ? Use heavy machinery. ? Drink alcohol. ? Take sleeping pills or medicines that cause drowsiness. ? Make important decisions or sign legal documents. ? Take care of children on your own.  Rest. Eating and drinking  If you vomit, drink water, juice, or soup when you can drink without vomiting.  Drink enough fluid to keep your urine clear or pale yellow.  Make sure you have little or no nausea before eating solid foods.  Follow the diet recommended by your health care provider. General instructions  Have a responsible adult stay with you until you are awake and alert.  Return to your normal activities as  told by your health care provider. Ask your health care provider what activities are safe for you.  Take over-the-counter and prescription medicines only as told by your health care provider.  If you smoke, do not smoke without supervision.  Keep all follow-up visits as told by your health care provider. This is important. Contact a health care provider if:  You continue to have nausea or vomiting at home, and medicines are not helpful.  You cannot  drink fluids or start eating again.  You cannot urinate after 8-12 hours.  You develop a skin rash.  You have fever.  You have increasing redness at the site of your procedure. Get help right away if:  You have difficulty breathing.  You have chest pain.  You have unexpected bleeding.  You feel that you are having a life-threatening or urgent problem. This information is not intended to replace advice given to you by your health care provider. Make sure you discuss any questions you have with your health care provider. Document Released: 02/22/2001 Document Revised: 04/20/2016 Document Reviewed: 10/31/2015 Elsevier Interactive Patient Education  [employer].

## 2017-12-21 ENCOUNTER — Encounter: Payer: Self-pay | Admitting: Orthopedic Surgery

## 2017-12-28 DIAGNOSIS — Z9889 Other specified postprocedural states: Secondary | ICD-10-CM | POA: Insufficient documentation

## 2018-04-05 DIAGNOSIS — R252 Cramp and spasm: Secondary | ICD-10-CM | POA: Insufficient documentation

## 2018-07-16 ENCOUNTER — Emergency Department
Admission: EM | Admit: 2018-07-16 | Discharge: 2018-07-16 | Disposition: A | Discharge status: Home / Self Care | Payer: Medicare Other | Attending: Emergency Medicine | Admitting: Emergency Medicine

## 2018-07-16 ENCOUNTER — Encounter: Payer: Self-pay | Admitting: Emergency Medicine

## 2018-07-16 ENCOUNTER — Other Ambulatory Visit: Payer: Self-pay

## 2018-07-16 ENCOUNTER — Emergency Department: Payer: Medicare Other

## 2018-07-16 DIAGNOSIS — I700 Atherosclerosis of aorta: Secondary | ICD-10-CM | POA: Diagnosis not present

## 2018-07-16 DIAGNOSIS — R42 Dizziness and giddiness: Secondary | ICD-10-CM | POA: Insufficient documentation

## 2018-07-16 DIAGNOSIS — F17220 Nicotine dependence, chewing tobacco, uncomplicated: Secondary | ICD-10-CM | POA: Diagnosis not present

## 2018-07-16 DIAGNOSIS — R112 Nausea with vomiting, unspecified: Secondary | ICD-10-CM | POA: Diagnosis not present

## 2018-07-16 DIAGNOSIS — R51 Headache: Secondary | ICD-10-CM | POA: Diagnosis not present

## 2018-07-16 DIAGNOSIS — R1084 Generalized abdominal pain: Secondary | ICD-10-CM | POA: Diagnosis present

## 2018-07-16 DIAGNOSIS — Z8679 Personal history of other diseases of the circulatory system: Secondary | ICD-10-CM | POA: Insufficient documentation

## 2018-07-16 DIAGNOSIS — Z8249 Family history of ischemic heart disease and other diseases of the circulatory system: Secondary | ICD-10-CM | POA: Insufficient documentation

## 2018-07-16 DIAGNOSIS — I10 Essential (primary) hypertension: Secondary | ICD-10-CM | POA: Insufficient documentation

## 2018-07-16 DIAGNOSIS — E119 Type 2 diabetes mellitus without complications: Secondary | ICD-10-CM | POA: Diagnosis not present

## 2018-07-16 MED ORDER — ONDANSETRON 4 MG PO TBDP: 4.0000 mg | ORAL_TABLET | Freq: Three times a day (TID) | ORAL | 0 refills | Status: DC | PRN

## 2018-07-16 MED ORDER — ONDANSETRON HCL 4 MG/2ML IJ SOLN
4.0000 mg | Freq: Once | INTRAMUSCULAR | Status: AC
  Administered 2018-07-16: 4 mg via INTRAVENOUS
  Filled 2018-07-16: qty 2

## 2018-07-16 MED ORDER — IOPAMIDOL (ISOVUE-300) INJECTION 61%
100.0000 mL | Freq: Once | INTRAVENOUS | Status: AC | PRN
  Administered 2018-07-16: 100 mL via INTRAVENOUS

## 2018-07-16 MED ORDER — OMEPRAZOLE 20 MG PO CPDR: 20.0000 mg | DELAYED_RELEASE_CAPSULE | Freq: Every day | ORAL | 0 refills | Status: DC

## 2018-07-16 MED ORDER — SODIUM CHLORIDE 0.9 % IV SOLN
Freq: Once | INTRAVENOUS | Status: AC
  Administered 2018-07-16: 14:00:00 via INTRAVENOUS

## 2018-07-16 NOTE — ED Provider Notes (Signed)
[facility] Emergency Department Provider Note       Time seen: ----------------------------------------- 1:57 PM on 07/16/2018 -----------------------------------------   I have reviewed the triage vital signs and the nursing notes.  HISTORY   Chief Complaint Abdominal Pain    HPI Juan Watson is a 67 y.o. male with a history of arthritis, cholecystitis, diabetes, GERD, hypertension and hyper lipidemia who presents to the ED for abdominal pain that is been going on for several weeks.  He complains of headaches, nausea, vomiting and dizziness for 3 days.  Patient states one episode of vomiting this morning.  He was sent here by [facility] for further evaluation.  The patient has been working on his shop in the heat with no air conditioning and the family thinks he might have a heat related illness.  Past Medical History:  Diagnosis Date  . Acquired flat foot 11/15/2015  . Arthritis   . Arthritis of knee, degenerative 04/15/2015  . Benign hypertension 10/17/2010  . BPH (benign prostatic hyperplasia)   . Cholecystitis 06/28/2017  . Controlled type 2 diabetes mellitus without complication (HCC) 12/13/2002   Overview:  Overview:  DIET CONTROLLED Overview:  Overview:  DIET CONTROLLED   . Diabetes mellitus without complication (HCC)   . Diabetes mellitus, type 2 (HCC) 12/13/2002   Overview:  DIET CONTROLLED   . Disease of nail 07/16/2011  . Encounter for screening for malignant neoplasm of prostate 10/29/2014  . Essential (primary) hypertension 10/17/2010  . Familial aortic aneurysm 03/26/2014  . Family history of cardiovascular disease 03/26/2014  . Gastro-esophageal reflux disease without esophagitis 08/11/2013  . Generalized OA 08/11/2013  . GERD (gastroesophageal reflux disease)   . Gout 03/26/2014  . Heart murmur   . HOH (hard of hearing)    aids  . Hypercholesteremia 12/13/2002  . Hypercholesterolemia 12/13/2002  . Hypertension   . Primary  osteoarthritis of knee 04/15/2015  . Primary osteoarthritis of right knee 04/14/2015  . Pure hypercholesterolemia 12/13/2002  . Skin lesion 03/11/2015  . Type 2 diabetes mellitus (HCC) 12/13/2002   Overview:  DIET CONTROLLED     Patient Active Problem List   Diagnosis Date Noted  . Cholecystitis 06/28/2017  . Colon cancer screening 02/22/2017  . Aortic valve sclerosis 11/04/2016  . Hypertensive emergency 10/01/2016  . Bradycardia 09/17/2016  . Grief reaction 07/31/2016  . Acquired flat foot 11/15/2015  . Acquired right flat foot 11/15/2015  . Primary osteoarthritis of knee 04/15/2015  . Arthritis of knee, degenerative 04/15/2015  . Primary osteoarthritis of right knee 04/14/2015  . Skin lesion 03/11/2015  . Burning or prickling sensation 10/29/2014  . Encounter for screening for malignant neoplasm of prostate 10/29/2014  . Familial aortic aneurysm 03/26/2014  . Gout 03/26/2014  . Family history of cardiovascular disease 03/26/2014  . Gastro-esophageal reflux disease without esophagitis 08/11/2013  . Generalized OA 08/11/2013  . Pain in joint 09/30/2011  . Disease of nail 07/16/2011  . Paronychia of toe 07/16/2011  . Benign hypertension 10/17/2010  . Essential (primary) hypertension 10/17/2010  . Hypercholesteremia 12/13/2002  . Diabetes mellitus, type 2 (HCC) 12/13/2002  . Hypercholesterolemia 12/13/2002  . Pure hypercholesterolemia 12/13/2002  . Controlled type 2 diabetes mellitus without complication (HCC) 12/13/2002  . Type 2 diabetes mellitus (HCC) 12/13/2002    Past Surgical History:  Procedure Laterality Date  . BACK SURGERY  [phone]  . CARDIAC CATHETERIZATION  70's   pericarditis  . CATARACT EXTRACTION W/PHACO Right 03/23/2016   Procedure: CATARACT EXTRACTION PHACO AND INTRAOCULAR  LENS PLACEMENT (IOC);  Surgeon: Sallee LangeSteven Dingeldein, MD;  Location: [facility] ORS;  Service: Ophthalmology;  Laterality: Right;  US 01:31   . CHOLECYSTECTOMY N/A 06/29/2017   Procedure:  LAPAROSCOPIC CHOLECYSTECTOMY;  Surgeon: Henrene DodgePiscoya, Jose, MD;  Location: [facility] ORS;  Service: General;  Laterality: N/A;  . COLONOSCOPY WITH PROPOFOL N/A 10/06/2017   Procedure: COLONOSCOPY WITH PROPOFOL;  Surgeon: Scot JunElliott, Robert T, MD;  [facility];  Service: Endoscopy;  Laterality: N/A;  . HERNIA REPAIR  11,   umbilical x2  . KNEE ARTHROSCOPY Right 12/20/2017   Procedure: ARTHROSCOPY KNEE EXTENSIVE SYNOVECTOMY AND LYSIS OF ADHESIONS;  Surgeon: Donato HeinzHooten, James P, MD;  Location: [facility] ORS;  Service: Orthopedics;  Laterality: Right;  . KNEE DEBRIDEMENT Left    tendon  . TONSILLECTOMY  72  . TOTAL KNEE ARTHROPLASTY Right 04/15/2015   Procedure: TOTAL KNEE ARTHROPLASTY;  Surgeon: Gean BirchwoodFrank Rowan, MD;  Location: [facility] OR;  Service: Orthopedics;  Laterality: Right;  . TRANSURETHRAL RESECTION OF PROSTATE  ?    Allergies Bee venom; Statins; Sulfa antibiotics; and Ben gay ultra [menthol]  Social History Social History   Tobacco Use  . Smoking status: Former Smoker    Packs/day: 2.00    Years: 25.00    Pack years: 50.00    Types: Cigarettes    Last attempt to quit: 07/15/1992    Years since quitting: 26.0  . Smokeless tobacco: Current User    Types: Chew  Substance Use Topics  . Alcohol use: No  . Drug use: No   Review of Systems Constitutional: Negative for fever. Cardiovascular: Negative for chest pain. Respiratory: Negative for shortness of breath. Gastrointestinal: Positive for abdominal pain, vomiting Musculoskeletal: Negative for back pain. Skin: Negative for rash. Neurological: Positive for dizziness and headache  All systems negative/normal/unremarkable except as stated in the HPI  ____________________________________________   PHYSICAL EXAM:  VITAL SIGNS: ED Triage Vitals [07/16/18 1321]  Enc Vitals Group     BP (!) 157/87     Pulse Rate (!) 59     Resp 20     Temp 97.8 F (36.6 C)     Temp Source Oral     SpO2 98 %     Weight 227 lb (103 kg)     Height 5\' 7"   (1.702 m)     Head Circumference      Peak Flow      Pain Score 3     Pain Loc      Pain Edu?      Excl. in GC?    Constitutional: Alert and oriented. Well appearing and in no distress. Eyes: Conjunctivae are normal. Normal extraocular movements. ENT   Head: Normocephalic and atraumatic.   Nose: No congestion/rhinnorhea.   Mouth/Throat: Mucous membranes are moist.   Neck: No stridor. Cardiovascular: Normal rate, regular rhythm. No murmurs, rubs, or gallops. Respiratory: Normal respiratory effort without tachypnea nor retractions. Breath sounds are clear and equal bilaterally. No wheezes/rales/rhonchi. Gastrointestinal: Soft and nontender. Normal bowel sounds Musculoskeletal: Nontender with normal range of motion in extremities. No lower extremity tenderness nor edema. Neurologic:  Normal speech and language. No gross focal neurologic deficits are appreciated.  Skin:  Skin is warm, dry and intact. No rash noted. Psychiatric: Mood and affect are normal. Speech and behavior are normal.  ____________________________________________  EKG: Interpreted by me.  Sinus bradycardia with a rate of 55 bpm, normal PR interval, normal QRS, normal QT  ____________________________________________  ED COURSE:  As part of my medical decision making, I  reviewed the following data within the electronic MEDICAL RECORD NUMBER History obtained from family if available, nursing notes, old chart and ekg, as well as notes from prior ED visits. Patient presented for abdominal pain, we will assess with labs and imaging as indicated at this time.   Procedures ____________________________________________   LABS (pertinent positives/negatives)  [facility] RADIOLOGY Images were viewed by me CT of the abdomen pelvis with contrast IMPRESSION: 1. No acute abnormality 2.  Aortic Atherosclerosis  (ICD10-I70.0).  ____________________________________________  DIFFERENTIAL DIAGNOSIS   Dehydration, electrolyte abnormality, heat related illness, GERD, diverticulitis, obstruction  FINAL ASSESSMENT AND PLAN  Abdominal pain, vomiting   Plan: The patient had presented for abdominal pain and vomiting which may be related to a heat related illness. Patient's labs are reassuring. Patient's imaging not reveal any acute process.  I will increase his omeprazole but otherwise he is cleared for outpatient follow-up.   Ulice DashJohnathan E Williams, MD   Note: This note was generated in part or whole with voice recognition software. Voice recognition is usually quite accurate but there are transcription errors that can and very often do occur. I apologize for any typographical errors that were not detected and corrected.     Emily FilbertWilliams, Jonathan E, MD 07/16/18 [phone]

## 2018-07-16 NOTE — ED Triage Notes (Signed)
Pt states abd pain has been going on for the last couple of weeks. Pt c/o headaches, N/V, and dizziness for 3 days. Pt states one episode of vomiting this morning. Pt states " I just don't feel good" VSS. NAD noted. No blood work drawn due to pt having [facility].

## 2019-06-30 DIAGNOSIS — Z800 Family history of malignant neoplasm of digestive organs: Secondary | ICD-10-CM | POA: Insufficient documentation

## 2019-10-17 ENCOUNTER — Encounter: Payer: Self-pay | Admitting: *Deleted

## 2019-10-17 ENCOUNTER — Other Ambulatory Visit: Payer: Self-pay

## 2019-10-19 ENCOUNTER — Other Ambulatory Visit
Admission: RE | Admit: 2019-10-19 | Discharge: 2019-10-19 | Disposition: A | Discharge status: Home / Self Care | Payer: Medicare Other | Source: Ambulatory Visit | Attending: Ophthalmology | Admitting: Ophthalmology

## 2019-10-19 DIAGNOSIS — Z20828 Contact with and (suspected) exposure to other viral communicable diseases: Secondary | ICD-10-CM | POA: Insufficient documentation

## 2019-10-19 DIAGNOSIS — Z01812 Encounter for preprocedural laboratory examination: Secondary | ICD-10-CM | POA: Diagnosis present

## 2019-10-19 LAB — SARS CORONAVIRUS 2 (TAT 6-24 HRS): SARS Coronavirus 2: NEGATIVE

## 2019-10-19 NOTE — Discharge Instructions (Signed)

## 2019-10-23 ENCOUNTER — Ambulatory Visit
Admission: RE | Admit: 2019-10-23 | Discharge: 2019-10-23 | Disposition: A | Discharge status: Home / Self Care | Payer: Medicare Other | Attending: Ophthalmology | Admitting: Ophthalmology

## 2019-10-23 ENCOUNTER — Ambulatory Visit: Payer: Medicare Other | Admitting: Anesthesiology

## 2019-10-23 ENCOUNTER — Other Ambulatory Visit: Payer: Self-pay

## 2019-10-23 ENCOUNTER — Encounter
Admission: RE | Disposition: A | Discharge status: Home / Self Care | Payer: Self-pay | Source: Home / Self Care | Attending: Ophthalmology

## 2019-10-23 DIAGNOSIS — E1136 Type 2 diabetes mellitus with diabetic cataract: Secondary | ICD-10-CM | POA: Diagnosis not present

## 2019-10-23 DIAGNOSIS — Z79899 Other long term (current) drug therapy: Secondary | ICD-10-CM | POA: Diagnosis not present

## 2019-10-23 DIAGNOSIS — Z87891 Personal history of nicotine dependence: Secondary | ICD-10-CM | POA: Insufficient documentation

## 2019-10-23 DIAGNOSIS — Z7984 Long term (current) use of oral hypoglycemic drugs: Secondary | ICD-10-CM | POA: Insufficient documentation

## 2019-10-23 DIAGNOSIS — E7800 Pure hypercholesterolemia, unspecified: Secondary | ICD-10-CM | POA: Diagnosis not present

## 2019-10-23 DIAGNOSIS — K219 Gastro-esophageal reflux disease without esophagitis: Secondary | ICD-10-CM | POA: Diagnosis not present

## 2019-10-23 DIAGNOSIS — H2512 Age-related nuclear cataract, left eye: Secondary | ICD-10-CM | POA: Diagnosis present

## 2019-10-23 DIAGNOSIS — I10 Essential (primary) hypertension: Secondary | ICD-10-CM | POA: Diagnosis not present

## 2019-10-23 DIAGNOSIS — Z7982 Long term (current) use of aspirin: Secondary | ICD-10-CM | POA: Diagnosis not present

## 2019-10-23 DIAGNOSIS — Z888 Allergy status to other drugs, medicaments and biological substances status: Secondary | ICD-10-CM | POA: Diagnosis not present

## 2019-10-23 DIAGNOSIS — Z882 Allergy status to sulfonamides status: Secondary | ICD-10-CM | POA: Insufficient documentation

## 2019-10-23 DIAGNOSIS — M1990 Unspecified osteoarthritis, unspecified site: Secondary | ICD-10-CM | POA: Insufficient documentation

## 2019-10-23 HISTORY — PX: CATARACT EXTRACTION W/PHACO: SHX586

## 2019-10-23 HISTORY — DX: Motion sickness, initial encounter: T75.3XXA

## 2019-10-23 LAB — GLUCOSE, CAPILLARY
Glucose-Capillary: 140 mg/dL — ABNORMAL HIGH (ref 70–99)
Glucose-Capillary: 129 mg/dL — ABNORMAL HIGH (ref 70–99)

## 2019-10-23 SURGERY — PHACOEMULSIFICATION, CATARACT, WITH IOL INSERTION
Anesthesia: Monitor Anesthesia Care | Site: Eye | Laterality: Left

## 2019-10-23 MED ORDER — LACTATED RINGERS IV SOLN: 100.0000 mL/h | INTRAVENOUS | Status: DC

## 2019-10-23 MED ORDER — SODIUM HYALURONATE 10 MG/ML IO SOLN
INTRAOCULAR | Status: DC | PRN
  Administered 2019-10-23: 0.55 mL via INTRAOCULAR

## 2019-10-23 MED ORDER — FENTANYL CITRATE (PF) 100 MCG/2ML IJ SOLN
INTRAMUSCULAR | Status: DC | PRN
  Administered 2019-10-23 (×2): 50 ug via INTRAVENOUS

## 2019-10-23 MED ORDER — GLYCOPYRROLATE 0.2 MG/ML IJ SOLN
INTRAMUSCULAR | Status: DC | PRN
  Administered 2019-10-23: 0.2 mg via INTRAVENOUS

## 2019-10-23 MED ORDER — MOXIFLOXACIN HCL 0.5 % OP SOLN
OPHTHALMIC | Status: DC | PRN
  Administered 2019-10-23: 0.2 mL via OPHTHALMIC

## 2019-10-23 MED ORDER — ARMC OPHTHALMIC DILATING DROPS
1.0000 "application " | OPHTHALMIC | Status: DC | PRN
  Administered 2019-10-23 (×3): 1 via OPHTHALMIC

## 2019-10-23 MED ORDER — ONDANSETRON HCL 4 MG/2ML IJ SOLN: 4.0000 mg | Freq: Once | INTRAMUSCULAR | Status: DC | PRN

## 2019-10-23 MED ORDER — TETRACAINE HCL 0.5 % OP SOLN
1.0000 [drp] | OPHTHALMIC | Status: DC | PRN
  Administered 2019-10-23 (×3): 1 [drp] via OPHTHALMIC

## 2019-10-23 MED ORDER — LIDOCAINE HCL (PF) 2 % IJ SOLN
INTRAOCULAR | Status: DC | PRN
  Administered 2019-10-23: 2 mL via INTRAOCULAR

## 2019-10-23 MED ORDER — EPINEPHRINE PF 1 MG/ML IJ SOLN
INTRAOCULAR | Status: DC | PRN
  Administered 2019-10-23: 11:00:00 79 mL via OPHTHALMIC

## 2019-10-23 MED ORDER — MIDAZOLAM HCL 2 MG/2ML IJ SOLN
INTRAMUSCULAR | Status: DC | PRN
  Administered 2019-10-23: 2 mg via INTRAVENOUS

## 2019-10-23 MED ORDER — SODIUM HYALURONATE 23 MG/ML IO SOLN
INTRAOCULAR | Status: DC | PRN
  Administered 2019-10-23: 0.6 mL via INTRAOCULAR

## 2019-10-23 SURGICAL SUPPLY — 21 items
CANNULA ANT/CHMB 27G (MISCELLANEOUS) ×2 IMPLANT
CANNULA ANT/CHMB 27GA (MISCELLANEOUS) ×6 IMPLANT
DISSECTOR HYDRO NUCLEUS 50X22 (MISCELLANEOUS) ×3 IMPLANT
GLOVE SURG LX 7.5 STRW (GLOVE) ×2
GLOVE SURG LX STRL 7.5 STRW (GLOVE) ×1 IMPLANT
GLOVE SURG SYN 8.5  E (GLOVE) ×2
GLOVE SURG SYN 8.5 E (GLOVE) ×1 IMPLANT
GLOVE SURG SYN 8.5 PF PI (GLOVE) ×1 IMPLANT
GOWN STRL REUS W/ TWL LRG LVL3 (GOWN DISPOSABLE) ×2 IMPLANT
GOWN STRL REUS W/TWL LRG LVL3 (GOWN DISPOSABLE) ×4
LENS IOL ACRSF IQ TRC 7 20.0 IMPLANT
LENS IOL ACRYSOF IQ TORIC 20.0 ×2 IMPLANT
LENS IOL IQ TORIC 7 20.0 ×1 IMPLANT
MARKER SKIN DUAL TIP RULER LAB (MISCELLANEOUS) ×3 IMPLANT
PACK DR. KING ARMS (PACKS) ×3 IMPLANT
PACK EYE AFTER SURG (MISCELLANEOUS) ×3 IMPLANT
PACK OPTHALMIC (MISCELLANEOUS) ×3 IMPLANT
SYR 3ML LL SCALE MARK (SYRINGE) ×3 IMPLANT
SYR TB 1ML LUER SLIP (SYRINGE) ×3 IMPLANT
WATER STERILE IRR 250ML POUR (IV SOLUTION) ×3 IMPLANT
WIPE NON LINTING 3.25X3.25 (MISCELLANEOUS) ×3 IMPLANT

## 2019-10-23 NOTE — Anesthesia Procedure Notes (Signed)
Procedure Name: MAC Performed by: Sabree Nuon M, CRNA Pre-anesthesia Checklist: Timeout performed, Patient being monitored, Suction available, Emergency Drugs available and Patient identified Patient Re-evaluated:Patient Re-evaluated prior to induction Oxygen Delivery Method: Nasal cannula       

## 2019-10-23 NOTE — H&P (Signed)
The History and Physical notes are on paper, have been signed, and are to be scanned.   I have examined the patient and there are no changes to the H&P prior to the surgery, at 10:35 am.    Attestation: 1. The patient's impairment of visual function is believed not to be correctable with a tolerable change in glasses or contact lenses. 2. Cataract (in the operative eye) is believed to be significantly contributing to the patient's visual impairment. 3. The patient desires surgical correction; the risks, benefits and alternatives have been explained; and a reasonable expectation exists that lens surgery will significantly improve both the visual and functional status of the patient.  I certify the statements are true to the best of my knowledge.  Benay Pillow 10/23/2019 10:35 AM

## 2019-10-23 NOTE — Op Note (Signed)
OPERATIVE NOTE  Juan Watson [id number] 10/23/2019   PREOPERATIVE DIAGNOSIS:  Nuclear sclerotic cataract left eye.  H25.12   POSTOPERATIVE DIAGNOSIS:    Nuclear sclerotic cataract left eye.     PROCEDURE:  Phacoemusification with posterior chamber intraocular lens placement of the left eye   LENS:   Implant Name Type Inv. Item Serial No. Manufacturer Lot No. LRB No. Used Action  LENS ACRYSOF IQ [place] - [id number]  LENS ACRYSOF IQ TORIC [id number] ALCON  Left 1 Implanted      Procedure(s) with comments: CATARACT EXTRACTION PHACO AND INTRAOCULAR LENS PLACEMENT (IOC) LEFT DIABETIC TORIC LENS 5.82,   00:44.1 (Left) - Diabetic - oral meds  [id number] +19.5 D rotated to 004 degrees   ULTRASOUND TIME: 0 minutes 44 seconds.  CDE 5.82   SURGEON:  Benay Pillow, MD, MPH   ANESTHESIA:  Topical with tetracaine drops augmented with 1% preservative-free intracameral lidocaine.  ESTIMATED BLOOD LOSS: <1 mL   COMPLICATIONS:  None.   DESCRIPTION OF PROCEDURE:  The patient was identified in the holding room and transported to the operating room and placed in the supine position under the operating microscope.  The left eye was identified as the operative eye and it was prepped and draped in the usual sterile ophthalmic fashion.  The verion system was registered.   A 1.0 millimeter clear-corneal paracentesis was made at the 5:00 position. 0.5 ml of preservative-free 1% lidocaine with epinephrine was injected into the anterior chamber.  The anterior chamber was filled with Healon 5 viscoelastic.  A 2.4 millimeter keratome was used to make a near-clear corneal incision at the 2:00 position.  A curvilinear capsulorrhexis was made with a cystotome and capsulorrhexis forceps.  Balanced salt solution was used to hydrodissect and hydrodelineate the nucleus.   Phacoemulsification was then used in stop and chop fashion to remove the lens nucleus and epinucleus.  The remaining cortex was then removed using  the irrigation and aspiration handpiece. Healon was then placed into the capsular bag to distend it for lens placement.  A lens was then injected into the capsular bag.  The remaining viscoelastic was aspirated.  The lens was rotated into alignment with assistance from the Boyle system.  The lens was well centered and in good position.   Wounds were hydrated with balanced salt solution.  The anterior chamber was inflated to a physiologic pressure with balanced salt solution.  Intracameral vigamox 0.1 mL undiltued was injected into the eye and a drop placed onto the ocular surface.  No wound leaks were noted.  The patient was taken to the recovery room in stable condition without complications of anesthesia or surgery  Benay Pillow 10/23/2019, 11:06 AM

## 2019-10-23 NOTE — Anesthesia Preprocedure Evaluation (Signed)
Anesthesia Evaluation  Patient identified by MRN, date of birth, ID band Patient awake    Reviewed: Allergy & Precautions, NPO status , Patient's Chart, lab work & pertinent test results  History of Anesthesia Complications Negative for: history of anesthetic complications  Airway Mallampati: II  TM Distance: >3 FB Neck ROM: Full    Dental no notable dental hx.    Pulmonary former smoker,    Pulmonary exam normal breath sounds clear to auscultation       Cardiovascular hypertension, Pt. on medications Normal cardiovascular exam Rhythm:Regular Rate:Normal     Neuro/Psych Cataract negative psych ROS   GI/Hepatic Neg liver ROS, GERD  Medicated and Controlled,  Endo/Other  diabetes, Type 2, Oral Hypoglycemic Agents  Renal/GU negative Renal ROS     Musculoskeletal  (+) Arthritis , Osteoarthritis,    Abdominal (+) + obese,   Peds  Hematology   Anesthesia Other Findings   Reproductive/Obstetrics                             Anesthesia Physical  Anesthesia Plan  ASA: III  Anesthesia Plan: MAC   Post-op Pain Management:    Induction: Intravenous  PONV Risk Score and Plan: 2 and Ondansetron  Airway Management Planned: Nasal Cannula  Additional Equipment:   Intra-op Plan:   Post-operative Plan:   Informed Consent: I have reviewed the patients History and Physical, chart, labs and discussed the procedure including the risks, benefits and alternatives for the proposed anesthesia with the patient or authorized representative who has indicated his/her understanding and acceptance.     Dental advisory given  Plan Discussed with: CRNA  Anesthesia Plan Comments:         Anesthesia Quick Evaluation

## 2019-10-23 NOTE — Transfer of Care (Signed)
Immediate Anesthesia Transfer of Care Note  Patient: Juan Watson  Procedure(s) Performed: CATARACT EXTRACTION PHACO AND INTRAOCULAR LENS PLACEMENT (IOC) LEFT DIABETIC TORIC LENS 5.82,   00:44.1 (Left Eye)  Patient Location: PACU  Anesthesia Type: MAC  Level of Consciousness: awake, alert  and patient cooperative  Airway and Oxygen Therapy: Patient Spontanous Breathing and Patient connected to supplemental oxygen  Post-op Assessment: Post-op Vital signs reviewed, Patient's Cardiovascular Status Stable, Respiratory Function Stable, Patent Airway and No signs of Nausea or vomiting  Post-op Vital Signs: Reviewed and stable  Complications: No apparent anesthesia complications

## 2019-10-23 NOTE — Anesthesia Postprocedure Evaluation (Signed)
Anesthesia Post Note  Patient: Juan Watson  Procedure(s) Performed: CATARACT EXTRACTION PHACO AND INTRAOCULAR LENS PLACEMENT (IOC) LEFT DIABETIC TORIC LENS 5.82,   00:44.1 (Left Eye)     Anesthesia Post Evaluation  Aanyah Loa

## 2019-10-24 ENCOUNTER — Encounter: Payer: Self-pay | Admitting: Ophthalmology

## 2019-10-31 DIAGNOSIS — Z8616 Personal history of COVID-19: Secondary | ICD-10-CM

## 2019-10-31 HISTORY — DX: Personal history of COVID-19: Z86.16

## 2019-12-07 ENCOUNTER — Emergency Department: Payer: Medicare PPO

## 2019-12-07 ENCOUNTER — Emergency Department
Admission: EM | Admit: 2019-12-07 | Discharge: 2019-12-07 | Disposition: A | Discharge status: Home / Self Care | Payer: Medicare PPO | Attending: Emergency Medicine | Admitting: Emergency Medicine

## 2019-12-07 ENCOUNTER — Other Ambulatory Visit: Payer: Self-pay

## 2019-12-07 DIAGNOSIS — F17220 Nicotine dependence, chewing tobacco, uncomplicated: Secondary | ICD-10-CM | POA: Insufficient documentation

## 2019-12-07 DIAGNOSIS — I10 Essential (primary) hypertension: Secondary | ICD-10-CM | POA: Insufficient documentation

## 2019-12-07 DIAGNOSIS — E119 Type 2 diabetes mellitus without complications: Secondary | ICD-10-CM | POA: Insufficient documentation

## 2019-12-07 DIAGNOSIS — R0602 Shortness of breath: Secondary | ICD-10-CM | POA: Diagnosis present

## 2019-12-07 DIAGNOSIS — M7918 Myalgia, other site: Secondary | ICD-10-CM | POA: Insufficient documentation

## 2019-12-07 DIAGNOSIS — U071 COVID-19: Secondary | ICD-10-CM | POA: Diagnosis not present

## 2019-12-07 DIAGNOSIS — R05 Cough: Secondary | ICD-10-CM | POA: Insufficient documentation

## 2019-12-07 DIAGNOSIS — R509 Fever, unspecified: Secondary | ICD-10-CM | POA: Insufficient documentation

## 2019-12-07 DIAGNOSIS — Z96651 Presence of right artificial knee joint: Secondary | ICD-10-CM | POA: Insufficient documentation

## 2019-12-07 LAB — CBC WITH DIFFERENTIAL/PLATELET
Abs Immature Granulocytes: 0.05 10*3/uL (ref 0.00–0.07)
Basophils Absolute: 0 10*3/uL (ref 0.0–0.1)
Basophils Relative: 1 %
Eosinophils Absolute: 0.1 10*3/uL (ref 0.0–0.5)
Eosinophils Relative: 1 %
HCT: 40.7 % (ref 39.0–52.0)
Hemoglobin: 13.6 g/dL (ref 13.0–17.0)
Immature Granulocytes: 1 %
Lymphocytes Relative: 14 %
Lymphs Abs: 0.8 10*3/uL (ref 0.7–4.0)
MCH: 27.9 pg (ref 26.0–34.0)
MCHC: 33.4 g/dL (ref 30.0–36.0)
MCV: 83.4 fL (ref 80.0–100.0)
Monocytes Absolute: 0.6 10*3/uL (ref 0.1–1.0)
Monocytes Relative: 11 %
Neutro Abs: 3.9 10*3/uL (ref 1.7–7.7)
Neutrophils Relative %: 72 %
Platelets: 213 10*3/uL (ref 150–400)
RBC: 4.88 MIL/uL (ref 4.22–5.81)
RDW: 12.9 % (ref 11.5–15.5)
WBC: 5.3 10*3/uL (ref 4.0–10.5)
nRBC: 0 % (ref 0.0–0.2)

## 2019-12-07 LAB — COMPREHENSIVE METABOLIC PANEL
ALT: 21 U/L (ref 0–44)
AST: 23 U/L (ref 15–41)
Albumin: 4.1 g/dL (ref 3.5–5.0)
Alkaline Phosphatase: 46 U/L (ref 38–126)
Anion gap: 12 (ref 5–15)
BUN: 21 mg/dL (ref 8–23)
CO2: 21 mmol/L — ABNORMAL LOW (ref 22–32)
Calcium: 9.1 mg/dL (ref 8.9–10.3)
Chloride: 102 mmol/L (ref 98–111)
Creatinine, Ser: 0.95 mg/dL (ref 0.61–1.24)
GFR calc Af Amer: >60 mL/min (ref >60)
GFR calc non Af Amer: >60 mL/min (ref >60)
Glucose, Bld: 137 mg/dL — ABNORMAL HIGH (ref 70–99)
Potassium: 4.5 mmol/L (ref 3.5–5.1)
Sodium: 135 mmol/L (ref 135–145)
Total Bilirubin: 0.6 mg/dL (ref 0.3–1.2)
Total Protein: 7.6 g/dL (ref 6.5–8.1)

## 2019-12-07 LAB — URINALYSIS, COMPLETE (UACMP) WITH MICROSCOPIC
Bacteria, UA: NONE SEEN
Bilirubin Urine: NEGATIVE
Glucose, UA: NEGATIVE mg/dL
Hgb urine dipstick: NEGATIVE
Ketones, ur: NEGATIVE mg/dL
Leukocytes,Ua: NEGATIVE
Nitrite: NEGATIVE
Protein, ur: 30 mg/dL — AB
Specific Gravity, Urine: 1.024 (ref 1.005–1.030)
pH: 5 (ref 5.0–8.0)

## 2019-12-07 LAB — LACTIC ACID, PLASMA: Lactic Acid, Venous: 1.5 mmol/L (ref 0.5–1.9)

## 2019-12-07 MED ORDER — SODIUM CHLORIDE 0.9% FLUSH: 3.0000 mL | Freq: Once | INTRAVENOUS | Status: DC

## 2019-12-07 NOTE — ED Triage Notes (Addendum)
Pt was [facility]24. States he is having SOb with nonproductive cough with bodyaches and fever.pt is in NAD at present. Pt ambulatory to triage without tachypnea. Last took tylenol at 6am. Also was seen at [facility] walk in yesterday and had xray, states they dx with viral pne and "gave me abx"

## 2019-12-07 NOTE — ED Provider Notes (Signed)
[facility] Emergency Department Provider Note       Time seen: ----------------------------------------- 10:39 AM on 12/07/2019 -----------------------------------------   I have reviewed the triage vital signs and the nursing notes.  HISTORY   Chief Complaint URI    HPI Juan Watson is a 69 y.o. male with a history of arthritis, cholecystitis, diabetes, gout, hyperlipidemia who presents to the ED for COVID-19 symptoms.  [facility] on [facility]24.  States he is having shortness of breath with nonproductive cough with body aches and fever.  Past Medical History:  Diagnosis Date  . Acquired flat foot 11/15/2015  . Arthritis of knee, degenerative 04/15/2015  . BPH (benign prostatic hyperplasia)   . Cholecystitis 06/28/2017  . Diabetes mellitus without complication (HCC)   . Diabetes mellitus, type 2 (HCC) 12/13/2002   Overview:  DIET CONTROLLED   . Disease of nail 07/16/2011  . Encounter for screening for malignant neoplasm of prostate 10/29/2014  . Essential (primary) hypertension 10/17/2010  . Familial aortic aneurysm 03/26/2014  . Family history of cardiovascular disease 03/26/2014  . Gastro-esophageal reflux disease without esophagitis 08/11/2013  . Generalized OA 08/11/2013  . Gout 03/26/2014  . Heart murmur   . HOH (hard of hearing)    aids  . Motion sickness    "sea sick"  . Primary osteoarthritis of right knee 04/14/2015  . Pure hypercholesterolemia 12/13/2002  . Skin lesion 03/11/2015    Patient Active Problem List   Diagnosis Date Noted  . Cholecystitis 06/28/2017  . Colon cancer screening 02/22/2017  . Aortic valve sclerosis 11/04/2016  . Hypertensive emergency 10/01/2016  . Bradycardia 09/17/2016  . Grief reaction 07/31/2016  . Acquired flat foot 11/15/2015  . Acquired right flat foot 11/15/2015  . Primary osteoarthritis of knee 04/15/2015  . Arthritis of knee, degenerative 04/15/2015  . Primary  osteoarthritis of right knee 04/14/2015  . Skin lesion 03/11/2015  . Burning or prickling sensation 10/29/2014  . Encounter for screening for malignant neoplasm of prostate 10/29/2014  . Familial aortic aneurysm 03/26/2014  . Gout 03/26/2014  . Family history of cardiovascular disease 03/26/2014  . Gastro-esophageal reflux disease without esophagitis 08/11/2013  . Generalized OA 08/11/2013  . Pain in joint 09/30/2011  . Disease of nail 07/16/2011  . Paronychia of toe 07/16/2011  . Benign hypertension 10/17/2010  . Essential (primary) hypertension 10/17/2010  . Hypercholesteremia 12/13/2002  . Diabetes mellitus, type 2 (HCC) 12/13/2002  . Hypercholesterolemia 12/13/2002  . Pure hypercholesterolemia 12/13/2002  . Controlled type 2 diabetes mellitus without complication (HCC) 12/13/2002  . Type 2 diabetes mellitus (HCC) 12/13/2002    Past Surgical History:  Procedure Laterality Date  . BACK SURGERY  [phone]  . CARDIAC CATHETERIZATION  70's   pericarditis  . CATARACT EXTRACTION W/PHACO Right 03/23/2016   Procedure: CATARACT EXTRACTION PHACO AND INTRAOCULAR LENS PLACEMENT (IOC);  Surgeon: Sallee Lange, MD;  Location: [facility];  Service: Ophthalmology;  Laterality: Right;  [place] 01:31   . CATARACT EXTRACTION W/PHACO Left 10/23/2019   Procedure: CATARACT EXTRACTION PHACO AND INTRAOCULAR LENS PLACEMENT (IOC) LEFT DIABETIC TORIC LENS 5.82,   00:44.1;  Surgeon: Nevada Crane, MD;  Location: [facility];  Service: Ophthalmology;  Laterality: Left;  Diabetic - oral meds  . CHOLECYSTECTOMY N/A 06/29/2017   Procedure: LAPAROSCOPIC CHOLECYSTECTOMY;  Surgeon: Henrene Dodge, MD;  Location: [facility];  Service: General;  Laterality: N/A;  . COLONOSCOPY WITH PROPOFOL N/A 10/06/2017   Procedure: COLONOSCOPY WITH PROPOFOL;  Surgeon: Lynnae Prude  T, MD;  Location: [facility];  Service: Endoscopy;  Laterality: N/A;  . HERNIA REPAIR  11,   umbilical x2  . KNEE ARTHROSCOPY Right  12/20/2017   Procedure: ARTHROSCOPY KNEE EXTENSIVE SYNOVECTOMY AND LYSIS OF ADHESIONS;  Surgeon: Donato Heinz, MD;  Location: [facility];  Service: Orthopedics;  Laterality: Right;  . KNEE DEBRIDEMENT Left    tendon  . TONSILLECTOMY  72  . TOTAL KNEE ARTHROPLASTY Right 04/15/2015   Procedure: TOTAL KNEE ARTHROPLASTY;  Surgeon: Gean Birchwood, MD;  [facility];  Service: Orthopedics;  Laterality: Right;  . TRANSURETHRAL RESECTION OF PROSTATE  ?    Allergies Bee venom, Statins, Sulfa antibiotics, and Ben gay ultra [menthol]  Social History Social History   Tobacco Use  . Smoking status: Former Smoker    Packs/day: 2.00    Years: 25.00    Pack years: 50.00    Types: Cigarettes    Quit date: 07/15/1992    Years since quitting: 27.4  . Smokeless tobacco: Current User    Types: Chew  Substance Use Topics  . Alcohol use: No  . Drug use: No    Review of Systems Constitutional: Positive for fever Cardiovascular: Negative for chest pain. Respiratory: Positive for shortness of breath Gastrointestinal: Negative for abdominal pain, vomiting and diarrhea. Musculoskeletal: Negative for back pain.  Positive for body aches Skin: Negative for rash. Neurological: Negative for headaches, focal weakness or numbness.  All systems negative/normal/unremarkable except as stated in the HPI  ____________________________________________   PHYSICAL EXAM:  VITAL SIGNS: ED Triage Vitals  Enc Vitals Group     BP 12/07/19 0724 130/70     Pulse Rate 12/07/19 0724 80     Resp 12/07/19 0724 18     Temp 12/07/19 0724 100.2 F (37.9 C)     Temp Source 12/07/19 0724 Oral     SpO2 12/07/19 0724 95 %     Weight 12/07/19 0726 230 lb (104.3 kg)     Height 12/07/19 0726 5\' 8"  (1.727 m)     Head Circumference --      Peak Flow --      Pain Score 12/07/19 0726 5     Pain Loc --      Pain Edu? --      Excl. in GC? --     Constitutional: Alert and oriented. Well appearing and in no distress. Eyes:  Conjunctivae are normal. Normal extraocular movements. ENT      Head: Normocephalic and atraumatic.      Nose: No congestion/rhinnorhea.      Mouth/Throat: Mucous membranes are moist.      Neck: No stridor. Cardiovascular: Normal rate, regular rhythm. No murmurs, rubs, or gallops. Respiratory: Normal respiratory effort without tachypnea nor retractions. Breath sounds are clear and equal bilaterally. No wheezes/rales/rhonchi. Gastrointestinal: Soft and nontender. Normal bowel sounds Musculoskeletal: Nontender with normal range of motion in extremities. No lower extremity tenderness nor edema. Neurologic:  Normal speech and language. No gross focal neurologic deficits are appreciated.  Skin:  Skin is warm, dry and intact. No rash noted. Psychiatric: Mood and affect are normal. Speech and behavior are normal.  ____________________________________________  ED COURSE:  As part of my medical decision making, I reviewed the following data within the electronic MEDICAL RECORD NUMBER History obtained from family if available, nursing notes, old chart and ekg, as well as notes from prior ED visits. Patient presented for COVID-19 symptoms, we will assess with labs and imaging as indicated at this time.  Procedures  Juan Watson was evaluated in Emergency Department on 12/07/2019 for the symptoms described in the history of present illness. He was evaluated in the context of the global COVID-19 pandemic, which necessitated consideration that the patient might be at risk for infection with the SARS-CoV-2 virus that causes COVID-19. Institutional protocols and algorithms that pertain to the evaluation of patients at risk for COVID-19 are in a state of rapid change based on information released by regulatory bodies including the CDC and federal and state organizations. These policies and algorithms were followed during the patient's care in the ED.  ____________________________________________   LABS  (pertinent positives/negatives)  Labs Reviewed  COMPREHENSIVE METABOLIC PANEL - Abnormal; Notable for the following components:      Result Value   CO2 21 (*)    Glucose, Bld 137 (*)    All other components within normal limits  URINALYSIS, COMPLETE (UACMP) WITH MICROSCOPIC - Abnormal; Notable for the following components:   Color, Urine YELLOW (*)    APPearance HAZY (*)    Protein, ur 30 (*)    All other components within normal limits  CULTURE, BLOOD (ROUTINE X 2)  CULTURE, BLOOD (ROUTINE X 2)  LACTIC ACID, PLASMA  CBC WITH DIFFERENTIAL/PLATELET  LACTIC ACID, PLASMA    RADIOLOGY Images were viewed by me Chest x-ray IMPRESSION: Bibasilar atelectasis. Lungs elsewhere clear. Cardiac silhouette within normal limits. No adenopathy.  ____________________________________________   DIFFERENTIAL DIAGNOSIS   COVID-19, pneumonia, influenza, community-acquired pneumonia, dehydration  FINAL ASSESSMENT AND PLAN  COVID-19   Plan: The patient had presented for COVID-19 symptoms that persisted. Patient's labs are reassuring. Patient's imaging is also reassuring considering COVID-19.  Patient is encouraged to continue taking the medications he has already been prescribed.  He is in no distress with normal saturations and remarkably clear x-ray.   Laurence Aly, MD    Note: This note was generated in part or whole with voice recognition software. Voice recognition is usually quite accurate but there are transcription errors that can and very often do occur. I apologize for any typographical errors that were not detected and corrected.     Earleen Newport, MD 12/07/19 1056

## 2019-12-07 NOTE — ED Notes (Signed)
Pt has urine cup with him. He will get a sample as soon as he is able to urinate.

## 2019-12-07 NOTE — ED Notes (Signed)
BCx2 ,rainbow,with gray on ice has been sent to lab.

## 2019-12-12 LAB — CULTURE, BLOOD (ROUTINE X 2)
Culture: NO GROWTH
Culture: NO GROWTH
Special Requests: ADEQUATE
Special Requests: ADEQUATE

## 2020-02-06 ENCOUNTER — Other Ambulatory Visit: Payer: Self-pay

## 2020-02-06 ENCOUNTER — Ambulatory Visit: Payer: Medicare PPO | Admitting: Nurse Practitioner

## 2020-02-06 ENCOUNTER — Encounter: Payer: Self-pay | Admitting: Nurse Practitioner

## 2020-02-06 DIAGNOSIS — M2141 Flat foot [pes planus] (acquired), right foot: Secondary | ICD-10-CM

## 2020-02-06 DIAGNOSIS — M1711 Unilateral primary osteoarthritis, right knee: Secondary | ICD-10-CM | POA: Diagnosis not present

## 2020-02-06 DIAGNOSIS — I10 Essential (primary) hypertension: Secondary | ICD-10-CM

## 2020-02-06 DIAGNOSIS — E7800 Pure hypercholesterolemia, unspecified: Secondary | ICD-10-CM | POA: Diagnosis not present

## 2020-02-06 NOTE — Patient Instructions (Addendum)
It was wonderful to meet you today.   I have reviewed your past records and your recent physical exam done at [facility]. I have reviewed your Covid sick visits.   It is recommenced for you to get the Covid vaccine 90 days after your infection. Call the [facility] Dept for your vaccine or check with [place].    You want to get your glucose strips form a different carrier and we will place the order.  Please check your blood sugar and blood pressure and write it down. Please bring in the record in 4 months.   I signed your Handicap parking pass.

## 2020-02-06 NOTE — Progress Notes (Signed)
Subjective:    Patient ID: Juan Watson, male    DOB: January 23, 1951, 69 y.o.   MRN: [id number]  HPI He is a 69 yo who comes in to establish care with a new PCP after his health insurance changed. He is a retired Architectural technologist at [employer]. He was established with [facility] at [place] and saw Dr. Dorna Mai  01/26/2020 for DM2, HTN, HLD, GERD.  I have reviewed past medical, surgical, medication, allergy, social and family histories today and updated them in Epic where appropriate.   Current problems:  1. History of positive Covid test at [facility] 11/23/2019. He was seen in [facility] ED 12/07/2019 for SOB, non productive cough and body aches/fever. His CXR showed bibasilar atelectasis. Lungs otherwise clear. Cardiac silhouette within normal  Limits. No adenopathy. O2 sats were good and he was dc'd home to continue with outpatient therapy. He was never hospitalized.  He is slowly recovering- still has slowly improving DOE, fatigue, but no CP. Feels better weekly and does not think he needs to see a Pulmonologist or Cardiologist at this time. Known heart murmur with Dx  Aortic sclerosis per ECHO 2017.   2. DM2:  His A1c is stable on metformin 500 mg BID. He has protein in the urine. 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 Recent eye exam- Dr. Jeni Salles and had cataracts in Dec.  01/26/2020: Glucose 146, K 5.1    Chemistry      Component Value Date/Time   NA 135 12/07/2019 0733   K 4.5 12/07/2019 0733   CL 102 12/07/2019 0733   CO2 21 (L) 12/07/2019 0733   BUN 21 12/07/2019 0733   CREATININE 0.95 12/07/2019 0733      Component Value Date/Time   CALCIUM 9.1 12/07/2019 0733   ALKPHOS 46 12/07/2019 0733   AST 23 12/07/2019 0733   ALT 21 12/07/2019 0733   BILITOT 0.6 12/07/2019 0733      3. HTN: Fair control on : Continue and monitor BP at home 135/76, 138/80 and it is always high in the doctors office.   BP Readings from Last 3 Encounters:   02/06/20 (!) 142/72  12/07/19 140/73  10/23/19 (!) 160/73   4. Hypercholesterolemia: INTOLERANT of STATIN per chart history: Continue Lopid, fish oil, and cutting back on fast food no change in 1 year.  01/26/2020: Trigly 391, Chol 220, HDL 27, LDL 115 non fasting  4. GERD without esophagitis: Controlled on Prilosec 20 mg daily  5. Obesity: Trying to eat healthy and says he gets enough exercise during the day.   Past Medical History:  Diagnosis Date  . Acquired flat foot 11/15/2015  . Arthritis of knee, degenerative 04/15/2015  . BPH (benign prostatic hyperplasia)   . Cholecystitis 06/28/2017  . Diabetes mellitus without complication ([place])   . Diabetes mellitus, type 2 ([place]) 12/13/2002   Overview:  DIET CONTROLLED   . Disease of nail 07/16/2011  . Encounter for screening for malignant neoplasm of prostate 10/29/2014  . Essential (primary) hypertension 10/17/2010  . Familial aortic aneurysm 03/26/2014  . Family history of cardiovascular disease 03/26/2014  . Gastro-esophageal reflux disease without esophagitis 08/11/2013  . Generalized OA 08/11/2013  . Gout 03/26/2014  . Heart murmur   . HOH (hard of hearing)    aids  . Motion sickness    "sea sick"  . Primary osteoarthritis of right knee 04/14/2015  . Pure hypercholesterolemia 12/13/2002  . Skin lesion  03/11/2015     Past Surgical History:  Procedure Laterality Date  . BACK SURGERY  [phone]  . CARDIAC CATHETERIZATION  70's   pericarditis  . CATARACT EXTRACTION W/PHACO Right 03/23/2016   Procedure: CATARACT EXTRACTION PHACO AND INTRAOCULAR LENS PLACEMENT (IOC);  Surgeon: Estill Cotta, MD;  Location: [facility];  Service: Ophthalmology;  Laterality: Right;  [place] 01:31   . CATARACT EXTRACTION W/PHACO Left 10/23/2019   Procedure: CATARACT EXTRACTION PHACO AND INTRAOCULAR LENS PLACEMENT (IOC) LEFT DIABETIC TORIC LENS 5.82,   00:44.1;  Surgeon: Eulogio Bear, MD;  Location: [place];  Service: Ophthalmology;  Laterality:  Left;  Diabetic - oral meds  . CHOLECYSTECTOMY N/A 06/29/2017   Procedure: LAPAROSCOPIC CHOLECYSTECTOMY;  Surgeon: Olean Ree, MD;  Location: [facility];  Service: General;  Laterality: N/A;  . COLONOSCOPY WITH PROPOFOL N/A 10/06/2017   Procedure: COLONOSCOPY WITH PROPOFOL;  Surgeon: Manya Silvas, MD;  Location: [facility];  Service: Endoscopy;  Laterality: N/A;  . HERNIA REPAIR  11,   umbilical x2  . KNEE ARTHROSCOPY Right 12/20/2017   Procedure: ARTHROSCOPY KNEE EXTENSIVE SYNOVECTOMY AND LYSIS OF ADHESIONS;  Surgeon: Dereck Leep, MD;  Location: [facility];  Service: Orthopedics;  Laterality: Right;  . KNEE DEBRIDEMENT Left    tendon  . TONSILLECTOMY  72  . TOTAL KNEE ARTHROPLASTY Right 04/15/2015   Procedure: TOTAL KNEE ARTHROPLASTY;  Surgeon: Frederik Pear, MD;  Location: [place];  Service: Orthopedics;  Laterality: Right;  . TRANSURETHRAL RESECTION OF PROSTATE  ?    Family History  Problem Relation Age of Onset  . Multiple myeloma Mother   . Aneurysm Father   . Aneurysm Brother   . Nephrolithiasis Neg Hx   . Bladder Cancer Neg Hx   . Prostate cancer Neg Hx   . Kidney cancer Neg Hx    Social History: Reviewed: POSITIVE chewing tobacco- no desire to quit.   Allergies  Allergen Reactions  . Bee Venom Swelling    Sob, swelling  . Statins Other (See Comments)    Other reaction(s): SEVERE MYALGIAS  . Sulfa Antibiotics Swelling    Tongue swelling  . Ben Gay Ultra [Menthol] Other (See Comments)    Whelts and redness of skin    Current Outpatient Medications on File Prior to Visit  Medication Sig Dispense Refill  . acetaminophen (TYLENOL) 500 MG tablet Take 1,000-1,500 mg by mouth every 6 (six) hours as needed for mild pain or headache (2-3 tablets depends on pain).    Marland Kitchen amLODipine (NORVASC) 10 MG tablet Take 10 mg by mouth daily. In am.    . aspirin EC 81 MG tablet Take 81 mg by mouth at bedtime.    . cloNIDine (CATAPRES) 0.1 MG tablet Take 0.1 mg by mouth 2 (two) times  daily.   3  . diclofenac (VOLTAREN) 75 MG EC tablet Take 75 mg by mouth 2 (two) times daily as needed for mild pain.     Marland Kitchen gemfibrozil (LOPID) 600 MG tablet Take 600 mg by mouth 2 (two) times daily.  1  . glucose blood test strip 100 each by Other route as needed for other. Use as instructed  12 refills    . labetalol (NORMODYNE) 200 MG tablet Take 200 mg by mouth 2 (two) times daily.    Marland Kitchen losartan (COZAAR) 100 MG tablet Take 100 mg by mouth daily. In am.    . metFORMIN (GLUCOPHAGE) 500 MG tablet Take 500 mg by mouth 2 (two) times daily.  1  . Omega-3 Fatty Acids (FISH OIL) 1200 MG CAPS Take 1,200 mg by mouth daily.    Marland Kitchen omeprazole (PRILOSEC) 20 MG capsule Take 1 capsule (20 mg total) by mouth daily. In am 30 capsule 0  . vitamin B-12 (CYANOCOBALAMIN) 1000 MCG tablet Take 1,000 mcg by mouth daily.     No current facility-administered medications on file prior to visit.    BP (!) 142/72   Pulse 69   Temp (!) 96.9 F (36.1 C) (Temporal)   Resp 20   Ht 5' 6.5" (1.689 m)   Wt 228 lb (103.4 kg)   SpO2 97%   BMI 36.25 kg/m    Review of Systems  Constitutional: Positive for fatigue. Negative for appetite change, chills and fever.  HENT: Negative for congestion, sinus pain and sore throat.   Eyes: Negative.   Respiratory: Positive for shortness of breath. Negative for cough, chest tightness and wheezing.   Cardiovascular: Negative for chest pain, palpitations and leg swelling.  Gastrointestinal: Negative for abdominal pain, constipation, diarrhea, nausea and vomiting.  Genitourinary: Negative for difficulty urinating.  Musculoskeletal: Positive for arthralgias and gait problem.       He reports his flat foot on the right has caused gait issues and knee pain. He also has arthritis in knee and reports pain in the knee with walking short distances. Request handicap parking.   Skin: Negative for rash.  Neurological: Negative for dizziness, syncope, speech difficulty and light-headedness.   Hematological: Negative for adenopathy. Does not bruise/bleed easily.  Psychiatric/Behavioral:       Denies any concerns about depression or anxiety.      Objective:   Physical Exam Vitals reviewed.  Constitutional:      Appearance: Normal appearance. He is obese.  HENT:     Head: Normocephalic.  Eyes:     Pupils: Pupils are equal, round, and reactive to light.  Cardiovascular:     Rate and Rhythm: Normal rate and regular rhythm.     Heart sounds: Murmur present.  Pulmonary:     Effort: Pulmonary effort is normal.     Breath sounds: Normal breath sounds.  Abdominal:     Palpations: Abdomen is soft.     Tenderness: There is no abdominal tenderness.     Comments: Protuberant   Musculoskeletal:        General: No swelling or tenderness.     Cervical back: Normal range of motion.  Skin:    General: Skin is warm and dry.  Neurological:     Mental Status: He is alert and oriented to person, place, and time. Mental status is at baseline.  Psychiatric:        Mood and Affect: Mood normal.        Thought Content: Thought content normal.        Judgment: Judgment normal.     Comments: No concerns about depression/anxiety.      Lab Results  Component Value Date   WBC 5.3 12/07/2019   HGB 13.6 12/07/2019   HCT 40.7 12/07/2019   MCV 83.4 12/07/2019   PLT 213 12/07/2019       Chemistry      Component Value Date/Time   NA 135 12/07/2019 0733   K 4.5 12/07/2019 0733   CL 102 12/07/2019 0733   CO2 21 (L) 12/07/2019 0733   BUN 21 12/07/2019 0733   CREATININE 0.95 12/07/2019 0733      Component Value Date/Time   CALCIUM 9.1 12/07/2019 0733  ALKPHOS 46 12/07/2019 0733   AST 23 12/07/2019 0733   ALT 21 12/07/2019 0733   BILITOT 0.6 12/07/2019 0733     Lab Results  Component Value Date   HGBA1C 5.8 (H) 12/13/2017   12/07/2019: Urine: Positive protein      Assessment & Plan:   1. Recent Covid infection and slowly resolving DOE. He declines any PULM or Cardiology  referrals and reports he is getting better weekly.   2. DM2:  His A1c is stable. He is spilling some protein in urine.  He wants to check his BS every am and and I asked him to record 2 hours PP. He needs Preferred diabetic testing supplies: Accucheck or Tivida and this was ordered. We discussed that he needs tighter control over diet, healthy eating.   3.HTN: Fair control on 4 classes of meds. Reports white coat. Continue and monitor BP several times a week and bring in written record. Normal renal function.   4. Hypercholesterolemia: Not at goal. Per chart review INTOLERANT of STATINS: Continue Lopid, fish oil, and cutting back on fast food no change in 1 year. Consider referral to LIPID specialist.   5. GERD without esophagitis  6. He asks when to get the Covid vaccine- 90 days after his Covid infection.  Call the [facility] Dept for your vaccine or check with [place].   7.  Osteoarthritis knee : Requests Handicap sticker for knee and has used it for 5 years.  8.  Chewing tobacco- no desire to quit.   Follow up in 4 months. He did not want to come in at 3 mos.   This visit occurred during the SARS-CoV-2 public health emergency.  Safety protocols were in place, including screening questions prior to the visit, additional usage of staff PPE, and extensive cleaning of exam room while observing appropriate contact time as indicated for disinfecting solutions.   Denice Paradise, ANP

## 2020-02-07 ENCOUNTER — Encounter: Payer: Self-pay | Admitting: Nurse Practitioner

## 2020-02-07 NOTE — Assessment & Plan Note (Signed)
His A1c is stable on metformin 500 mg BID. He has protein in the urine.  Recent eye exam- Dr. Adele Schilder and had cataracts in 10/2019.

## 2020-02-08 ENCOUNTER — Telehealth: Payer: Self-pay | Admitting: Nurse Practitioner

## 2020-02-08 MED ORDER — GEMFIBROZIL 600 MG PO TABS: 600.0000 mg | ORAL_TABLET | Freq: Two times a day (BID) | ORAL | 1 refills | Status: DC

## 2020-02-08 NOTE — Telephone Encounter (Signed)
Pt needs a refill on gemfibrozil need 90 day supply. He is currently out.

## 2020-02-13 ENCOUNTER — Emergency Department: Payer: Medicare PPO

## 2020-02-13 ENCOUNTER — Emergency Department
Admission: EM | Admit: 2020-02-13 | Discharge: 2020-02-13 | Disposition: A | Discharge status: Home / Self Care | Payer: Medicare PPO | Attending: Emergency Medicine | Admitting: Emergency Medicine

## 2020-02-13 ENCOUNTER — Other Ambulatory Visit: Payer: Self-pay

## 2020-02-13 ENCOUNTER — Encounter: Payer: Self-pay | Admitting: Emergency Medicine

## 2020-02-13 ENCOUNTER — Telehealth: Payer: Self-pay | Admitting: Nurse Practitioner

## 2020-02-13 DIAGNOSIS — Z7984 Long term (current) use of oral hypoglycemic drugs: Secondary | ICD-10-CM | POA: Diagnosis not present

## 2020-02-13 DIAGNOSIS — Z79899 Other long term (current) drug therapy: Secondary | ICD-10-CM | POA: Diagnosis not present

## 2020-02-13 DIAGNOSIS — I10 Essential (primary) hypertension: Secondary | ICD-10-CM | POA: Insufficient documentation

## 2020-02-13 DIAGNOSIS — Z7982 Long term (current) use of aspirin: Secondary | ICD-10-CM | POA: Insufficient documentation

## 2020-02-13 DIAGNOSIS — Z87891 Personal history of nicotine dependence: Secondary | ICD-10-CM | POA: Diagnosis not present

## 2020-02-13 DIAGNOSIS — Z8616 Personal history of COVID-19: Secondary | ICD-10-CM | POA: Insufficient documentation

## 2020-02-13 DIAGNOSIS — E119 Type 2 diabetes mellitus without complications: Secondary | ICD-10-CM | POA: Insufficient documentation

## 2020-02-13 DIAGNOSIS — R0602 Shortness of breath: Secondary | ICD-10-CM

## 2020-02-13 DIAGNOSIS — Z96651 Presence of right artificial knee joint: Secondary | ICD-10-CM | POA: Diagnosis not present

## 2020-02-13 LAB — CBC
HCT: 38.7 % — ABNORMAL LOW (ref 39.0–52.0)
Hemoglobin: 13.3 g/dL (ref 13.0–17.0)
MCH: 28.9 pg (ref 26.0–34.0)
MCHC: 34.4 g/dL (ref 30.0–36.0)
MCV: 84.1 fL (ref 80.0–100.0)
Platelets: 267 10*3/uL (ref 150–400)
RBC: 4.6 MIL/uL (ref 4.22–5.81)
RDW: 13.2 % (ref 11.5–15.5)
WBC: 6.4 10*3/uL (ref 4.0–10.5)
nRBC: 0 % (ref 0.0–0.2)

## 2020-02-13 LAB — BASIC METABOLIC PANEL
Anion gap: 11 (ref 5–15)
BUN: 30 mg/dL — ABNORMAL HIGH (ref 8–23)
CO2: 23 mmol/L (ref 22–32)
Calcium: 9.4 mg/dL (ref 8.9–10.3)
Chloride: 103 mmol/L (ref 98–111)
Creatinine, Ser: 1.25 mg/dL — ABNORMAL HIGH (ref 0.61–1.24)
GFR calc Af Amer: >60 mL/min (ref >60)
GFR calc non Af Amer: 58 mL/min — ABNORMAL LOW (ref >60)
Glucose, Bld: 146 mg/dL — ABNORMAL HIGH (ref 70–99)
Potassium: 4.4 mmol/L (ref 3.5–5.1)
Sodium: 137 mmol/L (ref 135–145)

## 2020-02-13 LAB — BRAIN NATRIURETIC PEPTIDE: B Natriuretic Peptide: 81 pg/mL (ref 0.0–100.0)

## 2020-02-13 LAB — TROPONIN I (HIGH SENSITIVITY): Troponin I (High Sensitivity): 3 ng/L (ref <18)

## 2020-02-13 MED ORDER — DEXAMETHASONE SODIUM PHOSPHATE 10 MG/ML IJ SOLN
INTRAMUSCULAR | Status: AC
  Administered 2020-02-13: 10 mg via INTRAVENOUS
  Filled 2020-02-13: qty 1

## 2020-02-13 MED ORDER — PREDNISONE 20 MG PO TABS: 40.0000 mg | ORAL_TABLET | Freq: Every day | ORAL | 0 refills | Status: AC

## 2020-02-13 MED ORDER — DEXAMETHASONE SODIUM PHOSPHATE 10 MG/ML IJ SOLN: 10.0000 mg | Freq: Once | INTRAMUSCULAR | Status: AC

## 2020-02-13 MED ORDER — ALBUTEROL SULFATE HFA 108 (90 BASE) MCG/ACT IN AERS: 2.0000 | INHALATION_SPRAY | Freq: Four times a day (QID) | RESPIRATORY_TRACT | 0 refills | Status: DC | PRN

## 2020-02-13 MED ORDER — IOHEXOL 350 MG/ML SOLN
75.0000 mL | Freq: Once | INTRAVENOUS | Status: AC | PRN
  Administered 2020-02-13: 75 mL via INTRAVENOUS
  Filled 2020-02-13: qty 75

## 2020-02-13 MED ORDER — SODIUM CHLORIDE 0.9% FLUSH
3.0000 mL | Freq: Once | INTRAVENOUS | Status: AC
  Administered 2020-02-13: 3 mL via INTRAVENOUS

## 2020-02-13 NOTE — ED Provider Notes (Signed)
[facility] Emergency Department Provider Note  ____________________________________________   First MD Initiated Contact with Patient 02/13/20 2032     (approximate)  I have reviewed the triage vital signs and the nursing notes.   HISTORY  Chief Complaint Shortness of Breath    HPI Juan Watson is a 69 y.o. male with below list of previous medical conditions presents to the emergency department secondary to persistent dyspnea since being diagnosed with COVID-19 on November 27, 2019.  Patient states that symptoms worsened yesterday while challenging the oral a car.  Patient denies any chest pain no lower extremity pain or swelling.       Past Medical History:  Diagnosis Date  . Acquired flat foot 11/15/2015  . Arthritis of knee, degenerative 04/15/2015  . BPH (benign prostatic hyperplasia)   . Cholecystitis 06/28/2017  . Diabetes mellitus without complication ([place])   . Diabetes mellitus, type 2 ([place]) 12/13/2002   Overview:  DIET CONTROLLED   . Disease of nail 07/16/2011  . Encounter for screening for malignant neoplasm of prostate 10/29/2014  . Essential (primary) hypertension 10/17/2010  . Familial aortic aneurysm 03/26/2014  . Family history of cardiovascular disease 03/26/2014  . Gastro-esophageal reflux disease without esophagitis 08/11/2013  . Generalized OA 08/11/2013  . Gout 03/26/2014  . Heart murmur   . HOH (hard of hearing)    aids  . Motion sickness    "sea sick"  . Primary osteoarthritis of right knee 04/14/2015  . Pure hypercholesterolemia 12/13/2002  . Skin lesion 03/11/2015    Patient Active Problem List   Diagnosis Date Noted  . Cholecystitis 06/28/2017  . Colon cancer screening 02/22/2017  . Aortic valve sclerosis 11/04/2016  . Bradycardia 09/17/2016  . Grief reaction 07/31/2016  . Acquired right flat foot 11/15/2015  . Arthritis of knee, degenerative 04/15/2015  . Primary osteoarthritis of right knee 04/14/2015  . Skin  lesion 03/11/2015  . Burning or prickling sensation 10/29/2014  . Encounter for screening for malignant neoplasm of prostate 10/29/2014  . Familial aortic aneurysm 03/26/2014  . Gout 03/26/2014  . Family history of cardiovascular disease 03/26/2014  . Gastro-esophageal reflux disease without esophagitis 08/11/2013  . Generalized OA 08/11/2013  . Disease of nail 07/16/2011  . Paronychia of toe 07/16/2011  . Benign hypertension 10/17/2010  . Essential (primary) hypertension 10/17/2010  . Hypercholesterolemia 12/13/2002  . Type 2 diabetes mellitus ([place]) 12/13/2002    Past Surgical History:  Procedure Laterality Date  . BACK SURGERY  [phone]  . CARDIAC CATHETERIZATION  70's   pericarditis  . CATARACT EXTRACTION W/PHACO Right 03/23/2016   Procedure: CATARACT EXTRACTION PHACO AND INTRAOCULAR LENS PLACEMENT (IOC);  Surgeon: Estill Cotta, MD;  Location: [facility] ORS;  Service: Ophthalmology;  Laterality: Right;  [place] 01:31   . CATARACT EXTRACTION W/PHACO Left 10/23/2019   Procedure: CATARACT EXTRACTION PHACO AND INTRAOCULAR LENS PLACEMENT (IOC) LEFT DIABETIC TORIC LENS 5.82,   00:44.1;  Surgeon: Eulogio Bear, MD;  Location: [place];  Service: Ophthalmology;  Laterality: Left;  Diabetic - oral meds  . CHOLECYSTECTOMY N/A 06/29/2017   Procedure: LAPAROSCOPIC CHOLECYSTECTOMY;  Surgeon: Olean Ree, MD;  Location: [facility] OR[facility];  Service: General;  Laterality: N/A;  . COLONOSCOPY WITH PROPOFOL N/A 10/06/2017   Procedure: COLONOSCOPY WITH PROPOFOL;  Surgeon: Manya Silvas, MD;  Location: [facility];  Service: Endoscopy;  Laterality: N/A;  . HERNIA REPAIR  11,   umbilical x2  . KNEE ARTHROSCOPY Right 12/20/2017   Procedure: ARTHROSCOPY KNEE EXTENSIVE SYNOVECTOMY  AND LYSIS OF ADHESIONS;  Surgeon: Dereck Leep, MD;  Location: [facility] ORS;  Service: Orthopedics;  Laterality: Right;  . KNEE DEBRIDEMENT Left    tendon  . TONSILLECTOMY  72  . TOTAL KNEE ARTHROPLASTY Right 04/15/2015     Procedure: TOTAL KNEE ARTHROPLASTY;  Surgeon: Frederik Pear, MD;  Location: [place];  Service: Orthopedics;  Laterality: Right;  . TRANSURETHRAL RESECTION OF PROSTATE  ?    Prior to Admission medications   Medication Sig Start Date End Date Taking? Authorizing Provider  acetaminophen (TYLENOL) 500 MG tablet Take 1,000-1,500 mg by mouth every 6 (six) hours as needed for mild pain or headache (2-3 tablets depends on pain).    [provider]  albuterol (VENTOLIN HFA) 108 (90 Base) MCG/ACT inhaler Inhale 2 puffs into the lungs every 6 (six) hours as needed for wheezing or shortness of breath. 02/13/20   Gregor Hams, MD  amLODipine (NORVASC) 10 MG tablet Take 10 mg by mouth daily. In am.    [provider]  aspirin EC 81 MG tablet Take 81 mg by mouth at bedtime.    [provider]  cloNIDine (CATAPRES) 0.1 MG tablet Take 0.1 mg by mouth 2 (two) times daily.  02/13/15   [provider]  diclofenac (VOLTAREN) 75 MG EC tablet Take 75 mg by mouth 2 (two) times daily as needed for mild pain.     [provider]  gemfibrozil (LOPID) 600 MG tablet Take 1 tablet (600 mg total) by mouth 2 (two) times daily. 02/08/20   Denice Paradise A, NP  glucose blood test strip 100 each by Other route as needed for other. Use as instructed  12 refills    [provider]  labetalol (NORMODYNE) 200 MG tablet Take 200 mg by mouth 2 (two) times daily.    [provider]  losartan (COZAAR) 100 MG tablet Take 100 mg by mouth daily. In am.    [provider]  metFORMIN (GLUCOPHAGE) 500 MG tablet Take 500 mg by mouth 2 (two) times daily.  02/25/15   [provider]  Omega-3 Fatty Acids (FISH OIL) 1200 MG CAPS Take 1,200 mg by mouth daily.    [provider]  omeprazole (PRILOSEC) 20 MG capsule Take 1 capsule (20 mg total) by mouth daily. In am 07/16/18   Earleen Newport, MD  predniSONE (DELTASONE) 20 MG tablet Take 2 tablets (40 mg  total) by mouth daily for 5 days. 02/13/20 02/18/20  Gregor Hams, MD  vitamin B-12 (CYANOCOBALAMIN) 1000 MCG tablet Take 1,000 mcg by mouth daily.    [provider]    Allergies Bee venom, Statins, Sulfa antibiotics, and Ben gay ultra [menthol]  Family History  Problem Relation Age of Onset  . Multiple myeloma Mother   . Aneurysm Father   . Aneurysm Brother   . Nephrolithiasis Neg Hx   . Bladder Cancer Neg Hx   . Prostate cancer Neg Hx   . Kidney cancer Neg Hx     Social History Social History   Tobacco Use  . Smoking status: Former Smoker    Packs/day: 2.00    Years: 25.00    Pack years: 50.00    Types: Cigarettes    Quit date: 07/15/1992    Years since quitting: 27.6  . Smokeless tobacco: Current User    Types: Chew  Substance Use Topics  . Alcohol use: No  . Drug use: No    Review of Systems Constitutional: No  fever/chills Eyes: No visual changes. ENT: No sore throat. Cardiovascular: Denies chest pain. Respiratory: Positive for shortness of breath. Gastrointestinal: No abdominal pain.  No nausea, no vomiting.  No diarrhea.  No constipation. Genitourinary: Negative for dysuria. Musculoskeletal: Negative for neck pain.  Negative for back pain. Integumentary: Negative for rash. Neurological: Negative for headaches, focal weakness or numbness.   ____________________________________________   PHYSICAL EXAM:  VITAL SIGNS: ED Triage Vitals [02/13/20 1759]  Enc Vitals Group     BP 128/66     Pulse Rate 60     Resp 20     Temp 97.7 F (36.5 C)     Temp Source Oral     SpO2 98 %     Weight 103 kg (227 lb)     Height 1.689 m (5' 6.5")     Head Circumference      Peak Flow      Pain Score 0     Pain Loc      Pain Edu?      Excl. in [place]?     Constitutional: Alert and oriented.  Eyes: Conjunctivae are normal.  Mouth/Throat: Patient is wearing a mask. Neck: No stridor.  No meningeal signs.   Cardiovascular: Normal rate, regular rhythm.  Good peripheral circulation. Grossly normal heart sounds. Respiratory: Normal respiratory effort.  No retractions. Gastrointestinal: Soft and nontender. No distention.  Musculoskeletal: No lower extremity tenderness nor edema. No gross deformities of extremities. Neurologic:  Normal speech and language. No gross focal neurologic deficits are appreciated.  Skin:  Skin is warm, dry and intact. Psychiatric: Mood and affect are normal. Speech and behavior are normal.  ____________________________________________   LABS (all labs ordered are listed, but only abnormal results are displayed)  Labs Reviewed  BASIC METABOLIC PANEL - Abnormal; Notable for the following components:      Result Value   Glucose, Bld 146 (*)    BUN 30 (*)    Creatinine, Ser 1.25 (*)    GFR calc non Af Amer 58 (*)    All other components within normal limits  CBC - Abnormal; Notable for the following components:   HCT 38.7 (*)    All other components within normal limits  BRAIN NATRIURETIC PEPTIDE  TROPONIN I (HIGH SENSITIVITY)  TROPONIN I (HIGH SENSITIVITY)   ____________________________________________  EKG  ED ECG REPORT I, [place] N Willis Kuipers, the attending physician, personally viewed and interpreted this ECG.  Date: 02/13/2020  EKG Time: 1805 p.m.  Rate: 64  Rhythm: Normal sinus rhythm  Axis: Normal  Intervals: Normal  ST&T Change: None  ____________________________________________  RADIOLOGY I, [place] N Andrews Tener, personally viewed and evaluated these images (plain radiographs) as part of my medical decision making, as well as reviewing the written report by the radiologist.  ED MD interpretation: No active cardiopulmonary disease noted on chest x-ray  CT chest revealed no evidence of pulmonary emboli or any other acute findings per radiologist.  Official radiology report(s): DG Chest 2 View  Result Date: 02/13/2020 CLINICAL DATA:  Shortness of breath EXAM: CHEST - 2 VIEW COMPARISON:   12/07/2019 FINDINGS: The heart size and mediastinal contours are within normal limits. Both lungs are clear. The visualized skeletal structures are unremarkable. IMPRESSION: No active cardiopulmonary disease. Electronically Signed   By: Constance Holster M.D.   On: 02/13/2020 18:49   CT Angio Chest PE W and/or Wo Contrast  Result Date: 02/13/2020 CLINICAL DATA:  Shortness of breath. EXAM: CT ANGIOGRAPHY CHEST WITH CONTRAST TECHNIQUE: Multidetector CT imaging  of the chest was performed using the standard protocol during bolus administration of intravenous contrast. Multiplanar CT image reconstructions and MIPs were obtained to evaluate the vascular anatomy. CONTRAST:  93m OMNIPAQUE IOHEXOL 350 MG/ML SOLN COMPARISON:  June 28, 2017. FINDINGS: Cardiovascular: Contrast injection is sufficient to demonstrate satisfactory opacification of the pulmonary arteries to the segmental level. There is no pulmonary embolus. The main pulmonary artery is within normal limits for size. There is no CT evidence of acute right heart strain. The visualized aorta is normal. Heart size is mildly enlarged. Coronary artery calcifications are noted. There is no significant pericardial effusion. Mediastinum/Nodes: --No mediastinal or hilar lymphadenopathy. --No axillary lymphadenopathy. --No supraclavicular lymphadenopathy. --Normal thyroid gland. --The esophagus is unremarkable Lungs/Pleura: No pulmonary nodules or masses. No pleural effusion or pneumothorax. No focal airspace consolidation. No focal pleural abnormality. Upper Abdomen: No acute abnormality. Musculoskeletal: No chest wall abnormality. No acute or significant osseous findings. Review of the MIP images confirms the above findings. IMPRESSION: 1. No evidence of acute pulmonary embolism. 2. No findings to explain the patient's shortness of breath. Electronically Signed   By: CConstance HolsterM.D.   On: 02/13/2020 21:42      Procedures   ____________________________________________   INITIAL IMPRESSION / MDM / ASwift/ ED COURSE  As part of my medical decision making, I reviewed the following data within the [employer]   69year old male presented with above-stated history and physical exam differential diagnosis including but not limited to sequelae of COVID-19 infection, pulmonary emboli and less likely ACS.  Laboratory data including high-sensitivity troponin and BNP negative.  CT scan of the chest was performed which was also unremarkable.  Patient given Solu-Medrol in the emergency department with symptomatic improvement of dyspnea.  Patient will be prescribed prednisone for home.  I spoke with the patient at length regarding the necessity of strict glucose monitoring while taking prednisone given strong possibility of hypoglycemia associated with doing so. ____________________________________________  FINAL CLINICAL IMPRESSION(S) / ED DIAGNOSES  Final diagnoses:  SOB (shortness of breath)     MEDICATIONS GIVEN DURING THIS VISIT:  Medications  sodium chloride flush (NS) 0.9 % injection 3 mL (3 mLs Intravenous Given 02/13/20 2116)  dexamethasone (DECADRON) injection 10 mg (10 mg Intravenous Given 02/13/20 2113)  iohexol (OMNIPAQUE) 350 MG/ML injection 75 mL (75 mLs Intravenous Contrast Given 02/13/20 2125)     ED Discharge Orders         Ordered    predniSONE (DELTASONE) 20 MG tablet  Daily     02/13/20 2150    albuterol (VENTOLIN HFA) 108 (90 Base) MCG/ACT inhaler  Every 6 hours PRN     02/13/20 2150          *Please note:  Juan STROEBELwas evaluated in Emergency Department on 02/13/2020 for the symptoms described in the history of present illness. He was evaluated in the context of the global COVID-19 pandemic, which necessitated consideration that the patient might be at risk for infection with the SARS-CoV-2 virus that causes COVID-19. Institutional  protocols and algorithms that pertain to the evaluation of patients at risk for COVID-19 are in a state of rapid change based on information released by regulatory bodies including the CDC and federal and state organizations. These policies and algorithms were followed during the patient's care in the ED.  Some ED evaluations and interventions may be delayed as a result of limited staffing during the pandemic.*  Note:  This document was prepared  using Systems analyst and may include unintentional dictation errors.   Gregor Hams, MD 02/14/20 2112

## 2020-02-13 NOTE — ED Triage Notes (Signed)
Pt presents to ED via POV with c/o SOB. Pt ambulatory without difficulty. Pt able to speak in long, complete sentences at this time without difficulty, however, pt states feels SOB with speaking. Pt states dx with covid several weeks ago and since then has had worsening SOB. Pt A&O x4, NAD noted at this time, respirations even and unlabored, skin warm, dry, and intact.

## 2020-02-13 NOTE — Telephone Encounter (Signed)
I called Juan Watson and spoke to him. He was speaking easily on the phone. NO CP. He reports his BP has been very elevated.  He will go to the ED now  to explore worsening SOB. His wife will drive him.

## 2020-02-13 NOTE — ED Notes (Signed)
Pt taken to CT at this time.

## 2020-02-13 NOTE — Telephone Encounter (Signed)
Spoken to patient, he stated he cant be seen by Dr Avelino Leeds until the 25MAR2021. He feels his SOB is getting worse. He is able to speak and not have labored breathing during phone call. He stated that he does NOT have sx of arm,jaw,back, and chest pain.I did instruct him to go to ED due to SX. He did say he would comply. He would only go if it gets a little worse. He wants to try and wait for his appointment with Dr Avelino Leeds.

## 2020-02-13 NOTE — Telephone Encounter (Signed)
Pt called back transferred to [facility]

## 2020-02-13 NOTE — Telephone Encounter (Signed)
Pt called and said he is still having breathing issues. He said Dr. Arvilla Market told him that he should call us if it hasn't improved. He has seen Dr. Gwen Pounds before and it think he might needs to go back there. He is going to try calling but might need a referral.

## 2020-03-13 ENCOUNTER — Telehealth: Payer: Self-pay | Admitting: Nurse Practitioner

## 2020-03-13 MED ORDER — BLOOD GLUCOSE METER KIT: PACK | 3 refills | Status: DC

## 2020-03-13 MED ORDER — BLOOD GLUCOSE METER KIT: PACK | 0 refills | Status: DC

## 2020-03-13 NOTE — Telephone Encounter (Signed)
Pt called back to check on rx  Pt only has 2 strips left

## 2020-03-13 NOTE — Telephone Encounter (Signed)
Generally, [employer] covers Accu-Check strips.  The Epic formulary function is often inaccurate.   Can also order the generic "blood glucose meter kit and supplies" order. This will automatically default to print and will need to be faxed to the pharmacy

## 2020-03-13 NOTE — Telephone Encounter (Signed)
Pt states that insurance wont pay for OneTouch meter and test strips. Please call pt back-he only has two strips left.

## 2020-03-13 NOTE — Telephone Encounter (Signed)
Please order generic glucose strips- I do not know ahat his inusruance will cover.

## 2020-03-13 NOTE — Telephone Encounter (Signed)
Order has been placed. Will have Dr French Ana to sign rx. Will fax to pharmacy once completed.

## 2020-04-05 DIAGNOSIS — I209 Angina pectoris, unspecified: Secondary | ICD-10-CM | POA: Diagnosis present

## 2020-04-05 HISTORY — DX: Angina pectoris, unspecified: I20.9

## 2020-04-09 ENCOUNTER — Other Ambulatory Visit
Admission: RE | Admit: 2020-04-09 | Discharge: 2020-04-09 | Disposition: A | Discharge status: Home / Self Care | Payer: Medicare PPO | Source: Ambulatory Visit | Attending: Internal Medicine | Admitting: Internal Medicine

## 2020-04-09 DIAGNOSIS — Z20822 Contact with and (suspected) exposure to covid-19: Secondary | ICD-10-CM | POA: Diagnosis not present

## 2020-04-09 DIAGNOSIS — Z01812 Encounter for preprocedural laboratory examination: Secondary | ICD-10-CM | POA: Diagnosis present

## 2020-04-09 LAB — SARS CORONAVIRUS 2 (TAT 6-24 HRS): SARS Coronavirus 2: NEGATIVE

## 2020-04-11 ENCOUNTER — Encounter
Admission: RE | Disposition: A | Discharge status: Home / Self Care | Payer: Self-pay | Source: Home / Self Care | Attending: Internal Medicine

## 2020-04-11 ENCOUNTER — Encounter: Payer: Self-pay | Admitting: Internal Medicine

## 2020-04-11 ENCOUNTER — Other Ambulatory Visit: Payer: Self-pay

## 2020-04-11 ENCOUNTER — Observation Stay
Admission: RE | Admit: 2020-04-11 | Discharge: 2020-04-12 | Disposition: A | Discharge status: Home / Self Care | Payer: Medicare PPO | Attending: Internal Medicine | Admitting: Internal Medicine

## 2020-04-11 DIAGNOSIS — R0602 Shortness of breath: Secondary | ICD-10-CM | POA: Diagnosis not present

## 2020-04-11 DIAGNOSIS — Z79899 Other long term (current) drug therapy: Secondary | ICD-10-CM | POA: Insufficient documentation

## 2020-04-11 DIAGNOSIS — K219 Gastro-esophageal reflux disease without esophagitis: Secondary | ICD-10-CM | POA: Diagnosis not present

## 2020-04-11 DIAGNOSIS — Z87891 Personal history of nicotine dependence: Secondary | ICD-10-CM | POA: Diagnosis not present

## 2020-04-11 DIAGNOSIS — I25119 Atherosclerotic heart disease of native coronary artery with unspecified angina pectoris: Principal | ICD-10-CM | POA: Insufficient documentation

## 2020-04-11 DIAGNOSIS — Z8616 Personal history of COVID-19: Secondary | ICD-10-CM | POA: Insufficient documentation

## 2020-04-11 DIAGNOSIS — Z955 Presence of coronary angioplasty implant and graft: Secondary | ICD-10-CM

## 2020-04-11 DIAGNOSIS — Z7984 Long term (current) use of oral hypoglycemic drugs: Secondary | ICD-10-CM | POA: Insufficient documentation

## 2020-04-11 DIAGNOSIS — Z7982 Long term (current) use of aspirin: Secondary | ICD-10-CM | POA: Diagnosis not present

## 2020-04-11 DIAGNOSIS — E7800 Pure hypercholesterolemia, unspecified: Secondary | ICD-10-CM | POA: Insufficient documentation

## 2020-04-11 DIAGNOSIS — R9430 Abnormal result of cardiovascular function study, unspecified: Secondary | ICD-10-CM

## 2020-04-11 DIAGNOSIS — I209 Angina pectoris, unspecified: Secondary | ICD-10-CM | POA: Diagnosis present

## 2020-04-11 DIAGNOSIS — R079 Chest pain, unspecified: Secondary | ICD-10-CM | POA: Diagnosis present

## 2020-04-11 DIAGNOSIS — I110 Hypertensive heart disease with heart failure: Secondary | ICD-10-CM | POA: Diagnosis not present

## 2020-04-11 DIAGNOSIS — E119 Type 2 diabetes mellitus without complications: Secondary | ICD-10-CM | POA: Diagnosis not present

## 2020-04-11 HISTORY — PX: CORONARY STENT INTERVENTION: CATH118234

## 2020-04-11 HISTORY — PX: LEFT HEART CATH AND CORONARY ANGIOGRAPHY: CATH118249

## 2020-04-11 LAB — GLUCOSE, CAPILLARY
Glucose-Capillary: 130 mg/dL — ABNORMAL HIGH (ref 70–99)
Glucose-Capillary: 80 mg/dL (ref 70–99)
Glucose-Capillary: 160 mg/dL — ABNORMAL HIGH (ref 70–99)
Glucose-Capillary: 121 mg/dL — ABNORMAL HIGH (ref 70–99)
Glucose-Capillary: 140 mg/dL — ABNORMAL HIGH (ref 70–99)

## 2020-04-11 LAB — POCT ACTIVATED CLOTTING TIME: Activated Clotting Time: 346 seconds

## 2020-04-11 SURGERY — LEFT HEART CATH AND CORONARY ANGIOGRAPHY
Anesthesia: Moderate Sedation

## 2020-04-11 MED ORDER — HYDRALAZINE HCL 20 MG/ML IJ SOLN
INTRAMUSCULAR | Status: DC | PRN
  Administered 2020-04-11 (×2): 10 mg via INTRAVENOUS

## 2020-04-11 MED ORDER — SODIUM CHLORIDE 0.9% FLUSH: 3.0000 mL | Freq: Two times a day (BID) | INTRAVENOUS | Status: DC

## 2020-04-11 MED ORDER — ASPIRIN 81 MG PO CHEW
CHEWABLE_TABLET | ORAL | Status: AC
  Filled 2020-04-11: qty 1

## 2020-04-11 MED ORDER — DEXTROSE 5 % IV SOLN: Freq: Once | INTRAVENOUS | Status: AC

## 2020-04-11 MED ORDER — FENTANYL CITRATE (PF) 100 MCG/2ML IJ SOLN
INTRAMUSCULAR | Status: AC
  Filled 2020-04-11: qty 2

## 2020-04-11 MED ORDER — HEPARIN (PORCINE) IN NACL 1000-0.9 UT/500ML-% IV SOLN
INTRAVENOUS | Status: AC
  Filled 2020-04-11: qty 1000

## 2020-04-11 MED ORDER — MIDAZOLAM HCL 2 MG/2ML IJ SOLN
INTRAMUSCULAR | Status: DC | PRN
  Administered 2020-04-11 (×5): 1 mg via INTRAVENOUS

## 2020-04-11 MED ORDER — DEXTROSE-NACL 5-0.9 % IV SOLN: INTRAVENOUS | Status: DC

## 2020-04-11 MED ORDER — SODIUM CHLORIDE 0.9% FLUSH: 3.0000 mL | INTRAVENOUS | Status: DC | PRN

## 2020-04-11 MED ORDER — BIVALIRUDIN TRIFLUOROACETATE 250 MG IV SOLR
INTRAVENOUS | Status: AC
  Filled 2020-04-11: qty 250

## 2020-04-11 MED ORDER — LOSARTAN POTASSIUM 50 MG PO TABS
100.0000 mg | ORAL_TABLET | Freq: Every day | ORAL | Status: DC
  Administered 2020-04-12: 100 mg via ORAL
  Filled 2020-04-11: qty 2

## 2020-04-11 MED ORDER — SODIUM CHLORIDE 0.9% FLUSH
3.0000 mL | Freq: Two times a day (BID) | INTRAVENOUS | Status: DC
  Administered 2020-04-11 (×2): 3 mL via INTRAVENOUS

## 2020-04-11 MED ORDER — MIDAZOLAM HCL 2 MG/2ML IJ SOLN
INTRAMUSCULAR | Status: AC
  Filled 2020-04-11: qty 2

## 2020-04-11 MED ORDER — SODIUM CHLORIDE 0.9 % IV SOLN: 250.0000 mL | INTRAVENOUS | Status: DC | PRN

## 2020-04-11 MED ORDER — ALPRAZOLAM 0.5 MG PO TABS
1.0000 mg | ORAL_TABLET | Freq: Two times a day (BID) | ORAL | Status: DC | PRN
  Administered 2020-04-12: 1 mg via ORAL
  Filled 2020-04-11: qty 2

## 2020-04-11 MED ORDER — IOHEXOL 300 MG/ML  SOLN
INTRAMUSCULAR | Status: DC | PRN
  Administered 2020-04-11: 280 mL
  Administered 2020-04-11: 120 mL

## 2020-04-11 MED ORDER — TICAGRELOR 90 MG PO TABS
90.0000 mg | ORAL_TABLET | Freq: Two times a day (BID) | ORAL | Status: DC
  Administered 2020-04-11 – 2020-04-12 (×2): 90 mg via ORAL
  Filled 2020-04-11 (×2): qty 1

## 2020-04-11 MED ORDER — SODIUM CHLORIDE 0.9 % WEIGHT BASED INFUSION
1.0000 mL/kg/h | INTRAVENOUS | Status: AC
  Administered 2020-04-11: 1 mL/kg/h via INTRAVENOUS

## 2020-04-11 MED ORDER — TICAGRELOR 90 MG PO TABS
ORAL_TABLET | ORAL | Status: AC
  Filled 2020-04-11: qty 2

## 2020-04-11 MED ORDER — AMLODIPINE BESYLATE 5 MG PO TABS
10.0000 mg | ORAL_TABLET | Freq: Every day | ORAL | Status: DC
  Administered 2020-04-12: 10 mg via ORAL
  Filled 2020-04-11: qty 2

## 2020-04-11 MED ORDER — TICAGRELOR 90 MG PO TABS: 90.0000 mg | ORAL_TABLET | Freq: Two times a day (BID) | ORAL | 4 refills | Status: DC

## 2020-04-11 MED ORDER — ONDANSETRON HCL 4 MG/2ML IJ SOLN: 4.0000 mg | Freq: Four times a day (QID) | INTRAMUSCULAR | Status: DC | PRN

## 2020-04-11 MED ORDER — ASPIRIN 81 MG PO CHEW
CHEWABLE_TABLET | ORAL | Status: DC | PRN
  Administered 2020-04-11: 243 mg via ORAL

## 2020-04-11 MED ORDER — TICAGRELOR 90 MG PO TABS
ORAL_TABLET | ORAL | Status: DC | PRN
  Administered 2020-04-11: 180 mg via ORAL

## 2020-04-11 MED ORDER — DEXTROSE 50 % IV SOLN
INTRAVENOUS | Status: AC
  Filled 2020-04-11: qty 50

## 2020-04-11 MED ORDER — ASPIRIN 81 MG PO CHEW
81.0000 mg | CHEWABLE_TABLET | Freq: Every day | ORAL | Status: DC
  Administered 2020-04-12: 81 mg via ORAL
  Filled 2020-04-11: qty 1

## 2020-04-11 MED ORDER — LABETALOL HCL 200 MG PO TABS
200.0000 mg | ORAL_TABLET | Freq: Two times a day (BID) | ORAL | Status: DC
  Administered 2020-04-11 – 2020-04-12 (×3): 200 mg via ORAL
  Filled 2020-04-11: qty 2
  Filled 2020-04-11 (×2): qty 1
  Filled 2020-04-11: qty 2
  Filled 2020-04-11: qty 1

## 2020-04-11 MED ORDER — ASPIRIN 81 MG PO CHEW
CHEWABLE_TABLET | ORAL | Status: AC
  Filled 2020-04-11: qty 3

## 2020-04-11 MED ORDER — SODIUM CHLORIDE 0.9 % WEIGHT BASED INFUSION
3.0000 mL/kg/h | INTRAVENOUS | Status: DC
  Administered 2020-04-11: 3 mL/kg/h via INTRAVENOUS

## 2020-04-11 MED ORDER — FENTANYL CITRATE (PF) 100 MCG/2ML IJ SOLN
INTRAMUSCULAR | Status: DC | PRN
  Administered 2020-04-11 (×2): 25 ug via INTRAVENOUS
  Administered 2020-04-11 (×2): 50 ug via INTRAVENOUS

## 2020-04-11 MED ORDER — CLONIDINE HCL 0.1 MG PO TABS
0.1000 mg | ORAL_TABLET | Freq: Two times a day (BID) | ORAL | Status: DC
  Administered 2020-04-11 – 2020-04-12 (×2): 0.1 mg via ORAL
  Filled 2020-04-11 (×2): qty 1

## 2020-04-11 MED ORDER — HYDRALAZINE HCL 20 MG/ML IJ SOLN: 10.0000 mg | INTRAMUSCULAR | Status: AC | PRN

## 2020-04-11 MED ORDER — HYDRALAZINE HCL 20 MG/ML IJ SOLN
INTRAMUSCULAR | Status: AC
  Filled 2020-04-11: qty 1

## 2020-04-11 MED ORDER — PANTOPRAZOLE SODIUM 20 MG PO TBEC
20.0000 mg | DELAYED_RELEASE_TABLET | Freq: Every day | ORAL | Status: DC
  Administered 2020-04-11 – 2020-04-12 (×2): 20 mg via ORAL
  Filled 2020-04-11 (×2): qty 1

## 2020-04-11 MED ORDER — SODIUM CHLORIDE 0.9 % WEIGHT BASED INFUSION: 1.0000 mL/kg/h | INTRAVENOUS | Status: DC

## 2020-04-11 MED ORDER — ACETAMINOPHEN 325 MG PO TABS: 650.0000 mg | ORAL_TABLET | ORAL | Status: DC | PRN

## 2020-04-11 MED ORDER — GEMFIBROZIL 600 MG PO TABS
600.0000 mg | ORAL_TABLET | Freq: Two times a day (BID) | ORAL | Status: DC
  Administered 2020-04-11 – 2020-04-12 (×2): 600 mg via ORAL
  Filled 2020-04-11 (×3): qty 1

## 2020-04-11 MED ORDER — HEPARIN (PORCINE) IN NACL 1000-0.9 UT/500ML-% IV SOLN
INTRAVENOUS | Status: DC | PRN
  Administered 2020-04-11: 1500 mL

## 2020-04-11 MED ORDER — ASPIRIN 81 MG PO CHEW
81.0000 mg | CHEWABLE_TABLET | ORAL | Status: AC
  Administered 2020-04-11: 81 mg via ORAL

## 2020-04-11 MED ORDER — DICLOFENAC SODIUM 75 MG PO TBEC
75.0000 mg | DELAYED_RELEASE_TABLET | Freq: Every day | ORAL | Status: DC | PRN
  Filled 2020-04-11: qty 1

## 2020-04-11 MED ORDER — SODIUM CHLORIDE 0.9 % IV SOLN
INTRAVENOUS | Status: AC | PRN
  Administered 2020-04-11: 1.75 mg/kg/h via INTRAVENOUS

## 2020-04-11 MED ORDER — LABETALOL HCL 5 MG/ML IV SOLN: 10.0000 mg | INTRAVENOUS | Status: AC | PRN

## 2020-04-11 MED ORDER — DEXTROSE 50 % IV SOLN
INTRAVENOUS | Status: DC | PRN
  Administered 2020-04-11: .5 via INTRAVENOUS

## 2020-04-11 MED ORDER — BIVALIRUDIN BOLUS VIA INFUSION - CUPID
INTRAVENOUS | Status: DC | PRN
  Administered 2020-04-11: 76.875 mg via INTRAVENOUS

## 2020-04-11 MED ORDER — SODIUM CHLORIDE 0.9 % IV SOLN
0.2500 mg/kg/h | INTRAVENOUS | Status: DC
  Filled 2020-04-11: qty 250

## 2020-04-11 SURGICAL SUPPLY — 28 items
BALLN TREK OTW 3X15 (BALLOONS)
BALLN TREK RX 2.5X20 (BALLOONS) ×4
BALLN TREK RX 3.0X15 (BALLOONS) ×4
BALLOON TREK OTW 3X15 (BALLOONS) IMPLANT
BALLOON TREK RX 2.5X20 (BALLOONS) IMPLANT
BALLOON TREK RX 3.0X15 (BALLOONS) IMPLANT
CATH INFINITI 5FR ANG PIGTAIL (CATHETERS) ×2 IMPLANT
CATH INFINITI 5FR JL4 (CATHETERS) ×2 IMPLANT
CATH INFINITI JR4 5F (CATHETERS) ×2 IMPLANT
CATH VISTA GUIDE 6FR JR4 (CATHETERS) ×2 IMPLANT
CATH VISTA GUIDE 6FR XB3.5 (CATHETERS) ×2 IMPLANT
DEVICE CLOSURE MYNXGRIP 6/7F (Vascular Products) ×2 IMPLANT
DEVICE INFLAT 30 PLUS (MISCELLANEOUS) ×2 IMPLANT
DEVICE SAFEGUARD 24CM (GAUZE/BANDAGES/DRESSINGS) ×2 IMPLANT
KIT MANI 3VAL PERCEP (MISCELLANEOUS) ×4 IMPLANT
NDL PERC 18GX7CM (NEEDLE) IMPLANT
NEEDLE PERC 18GX7CM (NEEDLE) ×4 IMPLANT
PACK CARDIAC CATH (CUSTOM PROCEDURE TRAY) ×2 IMPLANT
PROTECTION STATION PRESSURIZED (MISCELLANEOUS) ×4
SHEATH AVANTI 5FR X 11CM (SHEATH) ×2 IMPLANT
SHEATH AVANTI 6FR X 11CM (SHEATH) ×2 IMPLANT
STATION PROTECTION PRESSURIZED (MISCELLANEOUS) IMPLANT
STENT RESOLUTE ONYX 2.5X22 (Permanent Stent) ×2 IMPLANT
STENT RESOLUTE ONYX 3.5X15 (Permanent Stent) IMPLANT
STENT RESOLUTE ONYX 3.5X30 (Permanent Stent) ×2 IMPLANT
WIRE ASAHI GRAND SLAM 180CM (WIRE) ×2 IMPLANT
WIRE G HI TQ BMW 190 (WIRE) ×4 IMPLANT
WIRE GUIDERIGHT .035X150 (WIRE) ×2 IMPLANT

## 2020-04-11 NOTE — Discharge Summary (Signed)
[facility] Cardiology Discharge Summary  Patient ID: Juan Watson MRN: [id number] DOB/AGE: February 05, 1951 69 y.o.  Admit date: 04/11/2020 Discharge date: 04/11/2020  Primary Discharge Diagnosis: Ischemic chest pain I20.9 Secondary Discharge Diagnosis diabetes, high blood pressure and high cholesterol  Significant Diagnostic Studies: Cardiac cath with left ventricular angiogram and selective coronary injection as well as PCI and stent placement of right coronary artery. And LCX  Hospital Course: The patient was admitted to specials for cardiac cath with selective coronary angiogram after full consent, risk and benefits explained, and time out called with all approprate details voiced and discussed. The patient has had progressive canadian class 3 angina with high probability risk stress test consistent with ischemic chest pain and or anginal equivalent with coronary artery risk factors including diabetes, high blood pressure and high cholesterol. The procedure was performed without complication and it revealed normal left ventricular function with ejection fraction of 55%.  It was found that the patient had severe 2 vessel coronary atherosclerosis with significant circumflex  And rca stenosis requiring further intervention. Therefore, the patient had a PCI and drug eluding  stent placed without complication. The patient has been ambulating without further significant symptoms and has reached his maximal hospital benefit and will be discharged to home in good condition.  Cardiac rehabilitation has been discussed and recommended. Medication management of cardiovascular risk factors will be given post discharge and modified as an outpatient.   Discharge Exam: Blood pressure 118/77, pulse 67, temperature 97.7 F (36.5 C), temperature source Oral, resp. rate 18, height 5' 6.5" (1.689 m), weight 102.5 kg, SpO2 99 %.  Constitutional: [place] oriented to person, place, and time. No distress.  HENT: No  nasal discharge.  Head: Normocephalic and atraumatic.  Eyes: Pupils are equal and round. No discharge.  Neck: Normal range of motion. Neck supple. No JVD present. No thyromegaly present.  Cardiovascular: Normal rate, regular rhythm, normal S1 S2, no gallop, no friction rub. No murmur Pulmonary/Chest: Effort normal, No stridor. No respiratory distress. no wheezes.  no rales.    Abdominal: Soft. Bowel sounds are normal.  no distension.  no tenderness. There is no rebound and no guarding.  Musculoskeletal: No edema, no cyanosis, normal pulses, no bleeding, Normal range of motion. no tenderness.  Neurological:  alert and oriented to person, place, and time. Coordination normal.  Skin: Skin is warm and dry. No rash noted. No erythema. No pallor.  Psychiatric:  normal mood and affect. behavior is normal.    Labs:   Lab Results  Component Value Date   WBC 6.4 02/13/2020   HGB 13.3 02/13/2020   HCT 38.7 (L) 02/13/2020   MCV 84.1 02/13/2020   PLT 267 02/13/2020   No results for input(s): NA, K, CL, CO2, BUN, CREATININE, CALCIUM, PROT, BILITOT, ALKPHOS, ALT, AST, GLUCOSE in the last 168 hours.  Invalid input(s): LABALBU  EKG: NSR without evidence of new changes  FOLLOW UP IN ONE TO TWO WEEKS Discharge Instructions    AMB Referral to Cardiac Rehabilitation - Phase II   Complete by: As directed    Diagnosis: Coronary Stents   After initial evaluation and assessments completed: Virtual Based Care may be provided alone or in conjunction with Phase 2 Cardiac Rehab based on patient barriers.: Yes     Allergies as of 04/11/2020      Reactions   Bee Venom Shortness Of Breath, Swelling   Statins Other (See Comments)   Other reaction(s): SEVERE MYALGIAS   Sulfa Antibiotics Swelling  Tongue swelling   Ben Gay Ultra [menthol] Other (See Comments)   Whelts and redness of skin      Medication List    STOP taking these medications   blood glucose meter kit and supplies   diclofenac 75 MG  EC tablet Commonly known as: VOLTAREN   glucose blood test strip     TAKE these medications   acetaminophen 500 MG tablet Commonly known as: TYLENOL Take 1,000 mg by mouth every 6 (six) hours as needed for mild pain or headache.   albuterol 108 (90 Base) MCG/ACT inhaler Commonly known as: VENTOLIN HFA Inhale 2 puffs into the lungs every 6 (six) hours as needed for wheezing or shortness of breath.   amLODipine 10 MG tablet Commonly known as: NORVASC Take 10 mg by mouth daily.   aspirin EC 81 MG tablet Take 81 mg by mouth at bedtime.   cloNIDine 0.1 MG tablet Commonly known as: CATAPRES Take 0.1 mg by mouth 2 (two) times daily.   Fish Oil 1200 MG Caps Take 1,200 mg by mouth daily in the afternoon.   gemfibrozil 600 MG tablet Commonly known as: LOPID Take 1 tablet (600 mg total) by mouth 2 (two) times daily.   isosorbide mononitrate 30 MG 24 hr tablet Commonly known as: IMDUR Take 30 mg by mouth daily.   labetalol 200 MG tablet Commonly known as: NORMODYNE Take 200 mg by mouth 2 (two) times daily.   losartan 100 MG tablet Commonly known as: COZAAR Take 100 mg by mouth daily.   metFORMIN 500 MG tablet Commonly known as: GLUCOPHAGE Take 500 mg by mouth 2 (two) times daily.   omeprazole 20 MG capsule Commonly known as: PRILOSEC Take 1 capsule (20 mg total) by mouth daily. In am What changed: Another medication with the same name was removed. Continue taking this medication, and follow the directions you see here.   ticagrelor 90 MG Tabs tablet Commonly known as: BRILINTA Take 1 tablet (90 mg total) by mouth 2 (two) times daily.   vitamin B-12 1000 MCG tablet Commonly known as: CYANOCOBALAMIN Take 1,000 mcg by mouth daily in the afternoon.        THE PATIENT  SHALL BRING ALL MEDICATIONS TO FOLLOW UP APPOINTMENT  Signed:  Corey Skains MD, [facility] 04/11/2020, 5:44 PM

## 2020-04-12 ENCOUNTER — Encounter: Payer: Self-pay | Admitting: Cardiology

## 2020-04-12 ENCOUNTER — Other Ambulatory Visit: Payer: Self-pay

## 2020-04-12 DIAGNOSIS — I25119 Atherosclerotic heart disease of native coronary artery with unspecified angina pectoris: Secondary | ICD-10-CM | POA: Diagnosis not present

## 2020-04-12 NOTE — Discharge Instructions (Signed)
Please continue meds asa and brilinta

## 2020-04-12 NOTE — Progress Notes (Signed)
Patient reports to be doing much better this morning and appears well. The patient denies all chest pain, dyspnea, weakness or discomfort. He is in NSR, VSS, femoral site is clean, dry and intact without any erythema, drainage or signs of infection. The dressing was changed to a bandage. The patient is ambulating on his own in the hallway. The patient and wife was thoroughly educated on femoral site precautions and at-home care and they each verbalized understanding of all the information. Instructed the patient to continue taking the aspirin and brilinta as prescribed and to follow up with Dr. Gwen Pounds within 1-2 weeks.

## 2020-04-12 NOTE — Progress Notes (Signed)
Patient discharged t home. Tele and IV d/c'd. Patient and wife verbalize understanding of post cath care.

## 2020-04-23 ENCOUNTER — Telehealth: Payer: Self-pay | Admitting: Nurse Practitioner

## 2020-04-23 MED ORDER — CLONIDINE HCL 0.1 MG PO TABS: 0.1000 mg | ORAL_TABLET | Freq: Two times a day (BID) | ORAL | 3 refills | Status: DC

## 2020-04-23 NOTE — Addendum Note (Signed)
Addended byElise Benne T on: 04/23/2020 08:33 AM   Modules accepted: Orders

## 2020-04-23 NOTE — Telephone Encounter (Signed)
Pt needs a refill on cloNIDine (CATAPRES) 0.1 MG tablet sent to [facility]

## 2020-04-25 DIAGNOSIS — I2510 Atherosclerotic heart disease of native coronary artery without angina pectoris: Secondary | ICD-10-CM | POA: Insufficient documentation

## 2020-04-30 ENCOUNTER — Telehealth: Payer: Self-pay | Admitting: Nurse Practitioner

## 2020-04-30 MED ORDER — GLUCOSE BLOOD VI STRP: ORAL_STRIP | 12 refills | Status: AC

## 2020-04-30 NOTE — Addendum Note (Signed)
Addended by: Elise Benne T on: 04/30/2020 03:44 PM   Modules accepted: Orders

## 2020-04-30 NOTE — Telephone Encounter (Signed)
Pt needs a refill on Accu Check test strips sent to [facility]

## 2020-05-15 ENCOUNTER — Other Ambulatory Visit: Payer: Self-pay

## 2020-05-15 MED ORDER — CLONIDINE HCL 0.1 MG PO TABS: 0.1000 mg | ORAL_TABLET | Freq: Two times a day (BID) | ORAL | 3 refills | Status: DC

## 2020-05-15 MED ORDER — METFORMIN HCL 500 MG PO TABS: 500.0000 mg | ORAL_TABLET | Freq: Two times a day (BID) | ORAL | 1 refills | Status: DC

## 2020-05-15 MED ORDER — AMLODIPINE BESYLATE 10 MG PO TABS: 10.0000 mg | ORAL_TABLET | Freq: Every day | ORAL | 1 refills | Status: DC

## 2020-05-15 MED ORDER — GEMFIBROZIL 600 MG PO TABS: 600.0000 mg | ORAL_TABLET | Freq: Two times a day (BID) | ORAL | 1 refills | Status: DC

## 2020-05-15 MED ORDER — OMEPRAZOLE 20 MG PO CPDR: 20.0000 mg | DELAYED_RELEASE_CAPSULE | Freq: Every day | ORAL | 0 refills | Status: DC

## 2020-05-30 ENCOUNTER — Other Ambulatory Visit: Payer: Self-pay

## 2020-05-30 MED ORDER — LABETALOL HCL 200 MG PO TABS: 200.0000 mg | ORAL_TABLET | Freq: Two times a day (BID) | ORAL | 1 refills | Status: DC

## 2020-06-07 ENCOUNTER — Ambulatory Visit: Payer: Medicare PPO | Admitting: Nurse Practitioner

## 2020-06-07 ENCOUNTER — Encounter: Payer: Self-pay | Admitting: Nurse Practitioner

## 2020-06-07 ENCOUNTER — Other Ambulatory Visit: Payer: Self-pay

## 2020-06-07 VITALS — BP 132/78 | HR 72 | Temp 97.5°F | Ht 67.0 in | Wt 230.0 lb

## 2020-06-07 DIAGNOSIS — Z0000 Encounter for general adult medical examination without abnormal findings: Secondary | ICD-10-CM | POA: Diagnosis not present

## 2020-06-07 DIAGNOSIS — E1169 Type 2 diabetes mellitus with other specified complication: Secondary | ICD-10-CM | POA: Diagnosis not present

## 2020-06-07 DIAGNOSIS — I10 Essential (primary) hypertension: Secondary | ICD-10-CM

## 2020-06-07 DIAGNOSIS — Z1159 Encounter for screening for other viral diseases: Secondary | ICD-10-CM

## 2020-06-07 DIAGNOSIS — I2510 Atherosclerotic heart disease of native coronary artery without angina pectoris: Secondary | ICD-10-CM

## 2020-06-07 DIAGNOSIS — Z125 Encounter for screening for malignant neoplasm of prostate: Secondary | ICD-10-CM

## 2020-06-07 DIAGNOSIS — K219 Gastro-esophageal reflux disease without esophagitis: Secondary | ICD-10-CM | POA: Diagnosis not present

## 2020-06-07 DIAGNOSIS — E785 Hyperlipidemia, unspecified: Secondary | ICD-10-CM

## 2020-06-07 LAB — CBC WITH DIFFERENTIAL/PLATELET
Basophils Absolute: 0.1 10*3/uL (ref 0.0–0.1)
Basophils Relative: 1.4 % (ref 0.0–3.0)
Eosinophils Absolute: 0.2 10*3/uL (ref 0.0–0.7)
Eosinophils Relative: 3.8 % (ref 0.0–5.0)
HCT: 39 % (ref 39.0–52.0)
Hemoglobin: 13.3 g/dL (ref 13.0–17.0)
Lymphocytes Relative: 25.9 % (ref 12.0–46.0)
Lymphs Abs: 1.4 10*3/uL (ref 0.7–4.0)
MCHC: 34 g/dL (ref 30.0–36.0)
MCV: 83.8 fl (ref 78.0–100.0)
Monocytes Absolute: 0.6 10*3/uL (ref 0.1–1.0)
Monocytes Relative: 11.1 % (ref 3.0–12.0)
Neutro Abs: 3 10*3/uL (ref 1.4–7.7)
Neutrophils Relative %: 57.8 % (ref 43.0–77.0)
Platelets: 227 10*3/uL (ref 150.0–400.0)
RBC: 4.66 Mil/uL (ref 4.22–5.81)
RDW: 13.2 % (ref 11.5–15.5)
WBC: 5.2 10*3/uL (ref 4.0–10.5)

## 2020-06-07 LAB — LDL CHOLESTEROL, DIRECT: Direct LDL: 125 mg/dL

## 2020-06-07 LAB — COMPREHENSIVE METABOLIC PANEL
ALT: 17 U/L (ref 0–53)
AST: 14 U/L (ref 0–37)
Albumin: 4.9 g/dL (ref 3.5–5.2)
Alkaline Phosphatase: 37 U/L — ABNORMAL LOW (ref 39–117)
BUN: 17 mg/dL (ref 6–23)
CO2: 30 mEq/L (ref 19–32)
Calcium: 10 mg/dL (ref 8.4–10.5)
Chloride: 102 mEq/L (ref 96–112)
Creatinine, Ser: 1.13 mg/dL (ref 0.40–1.50)
GFR: 64.26 mL/min (ref >60.00)
Glucose, Bld: 142 mg/dL — ABNORMAL HIGH (ref 70–99)
Potassium: 4.7 mEq/L (ref 3.5–5.1)
Sodium: 141 mEq/L (ref 135–145)
Total Bilirubin: 0.4 mg/dL (ref 0.2–1.2)
Total Protein: 6.6 g/dL (ref 6.0–8.3)

## 2020-06-07 LAB — LIPID PANEL
Cholesterol: 253 mg/dL — ABNORMAL HIGH (ref 0–200)
HDL: 29 mg/dL — ABNORMAL LOW (ref >39.00)
Total CHOL/HDL Ratio: 9
Triglycerides: 435 mg/dL — ABNORMAL HIGH (ref 0.0–149.0)

## 2020-06-07 LAB — MICROALBUMIN / CREATININE URINE RATIO
Creatinine,U: 131.6 mg/dL
Microalb Creat Ratio: 2.8 mg/g (ref 0.0–30.0)
Microalb, Ur: 3.7 mg/dL — ABNORMAL HIGH (ref 0.0–1.9)

## 2020-06-07 LAB — B12 AND FOLATE PANEL
Folate: 9.1 ng/mL (ref >5.9)
Vitamin B-12: 334 pg/mL (ref 211–911)

## 2020-06-07 LAB — TSH: TSH: 4.99 u[IU]/mL — ABNORMAL HIGH (ref 0.35–4.50)

## 2020-06-07 LAB — HEMOGLOBIN A1C: Hgb A1c MFr Bld: 7 % — ABNORMAL HIGH (ref 4.6–6.5)

## 2020-06-07 LAB — PSA: PSA: 0.96 ng/mL (ref 0.10–4.00)

## 2020-06-07 LAB — VITAMIN D 25 HYDROXY (VIT D DEFICIENCY, FRACTURES): VITD: 36.22 ng/mL (ref 30.00–100.00)

## 2020-06-07 NOTE — Progress Notes (Signed)
Established Patient Office Visit  Subjective:  Patient ID: Juan Watson, male    DOB: [birth year]  Age: 69 y.o. MRN: [id number]  CC:  Chief Complaint  Patient presents with  . Follow-up    HPI Juan Watson is a 69 yo with hx of Covid infection, CAD with recent stents on Effient, HLD,  GERD, T2DM, OA, gout, who presents for routine follow-up.  He reports he is feeling better now than he has in a long time.  He is feeling less shortness of breath with exertion.  DOE/CAD: He has had chronic shortness of breath since he had a Covid infection.  This led to pulmonology and cardiology evaluations.  He presents today on several blood pressure medicines as well as EC  aspirin 81 mg daily, prasugrel 10 mg daily for severe 2 vessel coronary atherosclerosis with significant circumflex  and RCA stenosis. This required a PCI and drug eluding  stent placed without complication. He is actively followed by Dr. Nehemiah Massed.    HTN: Essential hypertension, not at goal followed by cardiology telmisartan 80 mg daily will be labetalol 200 mg twice daily, clonidine 0.1 mg twice daily aspirin 81 mg daily amlodipine 10 mg daily BP Readings from Last 3 Encounters:  06/07/20 132/78  04/12/20 (!) 143/68  02/13/20 (!) 168/72   BILAT LEG PAIN:  leg pain leg groin to lower leg. It hurts with standing and relieved when he sits. Immediate relief with sleeping. He can hardly walki in the am.  T2DM: on Metformin 500 mg twice daily.  He has been checking his fasting blood sugar see notes below.  Diabetic foot exam performed today. He has poor pulses, normal color, good warmth good warm feet, normal color, has Doppler study set up for next month.  Lab Results  Component Value Date   HGBA1C 7.0 (H) 06/07/2020    HLD/BMI 36 with Obesity: On Gemfibrizol 600 mg twice daily, omega-3 fatty acids 1200 mg daily, has severe myalgias with statins. Lab Results  Component Value Date   CHOL 253 (H) 06/07/2020   HDL 29.00 (L)  06/07/2020   LDLDIRECT 125.0 06/07/2020   TRIG (H) 06/07/2020    435.0 Triglyceride is over 400; calculations on Lipids are invalid.   CHOLHDL 9 06/07/2020    Past Medical History:  Diagnosis Date  . Acquired flat foot 11/15/2015  . Arthritis of knee, degenerative 04/15/2015  . BPH (benign prostatic hyperplasia)   . Cholecystitis 06/28/2017  . Diabetes mellitus without complication ([place])   . Diabetes mellitus, type 2 ([place]) 12/13/2002   Overview:  DIET CONTROLLED   . Disease of nail 07/16/2011  . Encounter for screening for malignant neoplasm of prostate 10/29/2014  . Essential (primary) hypertension 10/17/2010  . Familial aortic aneurysm 03/26/2014  . Family history of cardiovascular disease 03/26/2014  . Gastro-esophageal reflux disease without esophagitis 08/11/2013  . Generalized OA 08/11/2013  . Gout 03/26/2014  . Heart murmur   . HOH (hard of hearing)    aids  . Motion sickness    "sea sick"  . Primary osteoarthritis of right knee 04/14/2015  . Pure hypercholesterolemia 12/13/2002  . Skin lesion 03/11/2015    Past Surgical History:  Procedure Laterality Date  . BACK SURGERY  [phone]  . CARDIAC CATHETERIZATION  70's   pericarditis  . CATARACT EXTRACTION W/PHACO Right 03/23/2016   Procedure: CATARACT EXTRACTION PHACO AND INTRAOCULAR LENS PLACEMENT (IOC);  Surgeon: Estill Cotta, MD;  Location: [facility];  Service: Ophthalmology;  Laterality: Right;  [place] 01:31   . CATARACT EXTRACTION W/PHACO Left 10/23/2019   Procedure: CATARACT EXTRACTION PHACO AND INTRAOCULAR LENS PLACEMENT (IOC) LEFT DIABETIC TORIC LENS 5.82,   00:44.1;  Surgeon: Eulogio Bear, MD;  Location: [place];  Service: Ophthalmology;  Laterality: Left;  Diabetic - oral meds  . CHOLECYSTECTOMY N/A 06/29/2017   Procedure: LAPAROSCOPIC CHOLECYSTECTOMY;  Surgeon: Olean Ree, MD;  Location: [facility];  Service: General;  Laterality: N/A;  . COLONOSCOPY WITH PROPOFOL N/A 10/06/2017   Procedure: COLONOSCOPY  WITH PROPOFOL;  Surgeon: Manya Silvas, MD;  Location: [facility];  Service: Endoscopy;  Laterality: N/A;  . CORONARY STENT INTERVENTION N/A 04/11/2020   Procedure: CORONARY STENT INTERVENTION;  Surgeon: Yolonda Kida, MD;  Location: [place] [facility];  Service: Cardiovascular;  Laterality: N/A;  . HERNIA REPAIR  11,   umbilical x2  . KNEE ARTHROSCOPY Right 12/20/2017   Procedure: ARTHROSCOPY KNEE EXTENSIVE SYNOVECTOMY AND LYSIS OF ADHESIONS;  Surgeon: Dereck Leep, MD;  Location: [facility];  Service: Orthopedics;  Laterality: Right;  . KNEE DEBRIDEMENT Left    tendon  . LEFT HEART CATH AND CORONARY ANGIOGRAPHY Left 04/11/2020   Procedure: LEFT HEART CATH AND CORONARY ANGIOGRAPHY;  Surgeon: Corey Skains, MD;  Location: [place] [facility];  Service: Cardiovascular;  Laterality: Left;  . TONSILLECTOMY  72  . TOTAL KNEE ARTHROPLASTY Right 04/15/2015   Procedure: TOTAL KNEE ARTHROPLASTY;  Surgeon: Frederik Pear, MD;  Location: [place];  Service: Orthopedics;  Laterality: Right;  . TRANSURETHRAL RESECTION OF PROSTATE  ?    Family History  Problem Relation Age of Onset  . Multiple myeloma Mother   . Aneurysm Father   . Aneurysm Brother   . Nephrolithiasis Neg Hx   . Bladder Cancer Neg Hx   . Prostate cancer Neg Hx   . Kidney cancer Neg Hx     Social History   Socioeconomic History  . Marital status: Married    Spouse name: Not on file  . Number of children: Not on file  . Years of education: Not on file  . Highest education level: Not on file  Occupational History  . Not on file  Tobacco Use  . Smoking status: Former Smoker    Packs/day: 2.00    Years: 25.00    Pack years: 50.00    Types: Cigarettes    Quit date: 07/15/1992    Years since quitting: 27.9  . Smokeless tobacco: Current User    Types: Chew  Vaping Use  . Vaping Use: Never used  Substance and Sexual Activity  . Alcohol use: No  . Drug use: No  . Sexual activity: Not Currently  Other Topics  Concern  . Not on file  Social History Narrative   Lives at home with wife private residence   Social Determinants of Health   Financial Resource Strain:   . Difficulty of Paying Living Expenses:   Food Insecurity:   . Worried About Charity fundraiser in the Last Year:   . Arboriculturist in the Last Year:   Transportation Needs:   . Film/video editor (Medical):   Marland Kitchen Lack of Transportation (Non-Medical):   Physical Activity:   . Days of Exercise per Week:   . Minutes of Exercise per Session:   Stress:   . Feeling of Stress :   Social Connections:   . Frequency of Communication with Friends and Family:   . Frequency of Social Gatherings with Friends  and Family:   . Attends Religious Services:   . Active Member of Clubs or Organizations:   . Attends Archivist Meetings:   Marland Kitchen Marital Status:   Intimate Partner Violence:   . Fear of Current or Ex-Partner:   . Emotionally Abused:   Marland Kitchen Physically Abused:   . Sexually Abused:     Outpatient Medications Prior to Visit  Medication Sig Dispense Refill  . acetaminophen (TYLENOL) 500 MG tablet Take 1,000 mg by mouth every 6 (six) hours as needed for mild pain or headache.     Marland Kitchen amLODipine (NORVASC) 10 MG tablet Take 1 tablet (10 mg total) by mouth daily. 90 tablet 1  . aspirin EC 81 MG tablet Take 81 mg by mouth at bedtime.    . cloNIDine (CATAPRES) 0.1 MG tablet Take 1 tablet (0.1 mg total) by mouth 2 (two) times daily. 60 tablet 3  . gemfibrozil (LOPID) 600 MG tablet Take 1 tablet (600 mg total) by mouth 2 (two) times daily. 180 tablet 1  . glucose blood test strip Use as instructed 100 each 12  . isosorbide mononitrate (IMDUR) 30 MG 24 hr tablet Take 30 mg by mouth daily.    Marland Kitchen labetalol (NORMODYNE) 200 MG tablet Take 1 tablet (200 mg total) by mouth 2 (two) times daily. 90 tablet 1  . losartan (COZAAR) 100 MG tablet Take 100 mg by mouth daily.     . metFORMIN (GLUCOPHAGE) 500 MG tablet Take 1 tablet (500 mg total) by  mouth 2 (two) times daily. 90 tablet 1  . Omega-3 Fatty Acids (FISH OIL) 1200 MG CAPS Take 1,200 mg by mouth daily in the afternoon.     . prasugrel (EFFIENT) 10 MG TABS tablet Take by mouth.    . telmisartan (MICARDIS) 80 MG tablet Take by mouth.    . vitamin B-12 (CYANOCOBALAMIN) 1000 MCG tablet Take 1,000 mcg by mouth daily in the afternoon.     Marland Kitchen albuterol (VENTOLIN HFA) 108 (90 Base) MCG/ACT inhaler Inhale 2 puffs into the lungs every 6 (six) hours as needed for wheezing or shortness of breath. 18 g 0  . ticagrelor (BRILINTA) 90 MG TABS tablet Take 1 tablet (90 mg total) by mouth 2 (two) times daily. 60 tablet 4  . omeprazole (PRILOSEC) 20 MG capsule Take 1 capsule (20 mg total) by mouth daily. In am 30 capsule 0   No facility-administered medications prior to visit.    Allergies  Allergen Reactions  . Bee Venom Shortness Of Breath and Swelling  . Statins Other (See Comments)    Other reaction(s): SEVERE MYALGIAS  . Sulfa Antibiotics Swelling    Tongue swelling  . Ben Gay Ultra [Menthol] Other (See Comments)    Whelts and redness of skin   Review of Systems  Constitutional: Negative for chills, fever and unexpected weight change.  HENT: Negative.   Eyes: Negative.   Respiratory: Negative for cough and shortness of breath.   Cardiovascular: Negative for chest pain, palpitations and leg swelling.  Gastrointestinal: Negative for abdominal pain, blood in stool, constipation and diarrhea.  Endocrine: Negative for cold intolerance, heat intolerance and polyuria.  Genitourinary: Negative for difficulty urinating and hematuria.  Musculoskeletal: Positive for arthralgias.       Lumbar back pain and leg pain.  He is set up for leg Doppler study. Hx of gout crystals bilat great toes.   Allergic/Immunologic: Negative.   Neurological: Negative for dizziness, syncope, weakness and light-headedness.  Hematological: Negative for adenopathy. Does  not bruise/bleed easily.   Psychiatric/Behavioral: Negative.        No concerns with depression or anxiety.      Objective:    Physical Exam Vitals reviewed.  Constitutional:      Appearance: Normal appearance. He is obese.  HENT:     Head: Normocephalic.  Cardiovascular:     Rate and Rhythm: Normal rate and regular rhythm.     Heart sounds: Murmur heard.   Pulmonary:     Effort: Pulmonary effort is normal.     Breath sounds: Normal breath sounds.  Abdominal:     General: Bowel sounds are normal.     Palpations: Abdomen is soft.     Tenderness: There is no abdominal tenderness.  Musculoskeletal:        General: Normal range of motion.     Cervical back: Normal range of motion and neck supple.     Comments: Foot exam: Poor bilat chest DP and PT distal pulses. Normal bilat monofilamnet sensation. Positive long nails, fungus, corns, gouty crystals,  -declines referral to Podiatry at this time- "too much going on right now with Cardiac testing. "    Skin:    General: Skin is warm and dry.  Neurological:     General: No focal deficit present.     Mental Status: He is alert and oriented to person, place, and time.  Psychiatric:        Mood and Affect: Mood normal.        Behavior: Behavior normal.     BP 132/78 (BP Location: Left Arm, Patient Position: Sitting, Cuff Size: Normal)   Pulse 72   Temp (!) 97.5 F (36.4 C) (Oral)   Ht _0  (1.702 m)   Wt 230 lb (104.3 kg)   SpO2 98%   BMI 36.02 kg/m  Wt Readings from Last 3 Encounters:  06/07/20 230 lb (104.3 kg)  04/11/20 225 lb 15.5 oz (102.5 kg)  02/13/20 227 lb (103 kg)            Health Maintenance Due  Topic Date Due  . Hepatitis C Screening  Never done  . FOOT EXAM  Never done  . TETANUS/TDAP  Never done  . HEMOGLOBIN A1C  06/12/2018    There are no preventive care reminders to display for this patient.  No results found for: TSH Lab Results  Component Value Date   WBC 6.4 02/13/2020   HGB 13.3 02/13/2020   HCT 38.7  (L) 02/13/2020   MCV 84.1 02/13/2020   PLT 267 02/13/2020   Lab Results  Component Value Date   NA 137 02/13/2020   K 4.4 02/13/2020   CO2 23 02/13/2020   GLUCOSE 146 (H) 02/13/2020   BUN 30 (H) 02/13/2020   CREATININE 1.25 (H) 02/13/2020   BILITOT 0.6 12/07/2019   ALKPHOS 46 12/07/2019   AST 23 12/07/2019   ALT 21 12/07/2019   PROT 7.6 12/07/2019   ALBUMIN 4.1 12/07/2019   CALCIUM 9.4 02/13/2020   ANIONGAP 11 02/13/2020   No results found for: CHOL No results found for: HDL No results found for: LDLCALC No results found for: TRIG No results found for: CHOLHDL Lab Results  Component Value Date   HGBA1C 5.8 (H) 12/13/2017      Assessment & Plan:   Problem List Items Addressed This Visit      Cardiovascular and Mediastinum   Essential hypertension - Primary   Relevant Orders   CBC with Differential/Platelet   Comprehensive  metabolic panel   Microalbumin / creatinine urine ratio   Coronary artery disease involving native heart without angina pectoris     Digestive   Gastro-esophageal reflux disease without esophagitis     Endocrine   Diabetes mellitus ([place])   Relevant Orders   HgB A1c     Other   Hyperlipidemia   Relevant Orders   TSH   Lipid Profile   Encounter for screening for malignant neoplasm of prostate   Relevant Orders   PSA    Other Visit Diagnoses    Need for hepatitis C screening test       Relevant Orders   Hepatitis C antibody   Preventative health care       Relevant Orders   B12 and Folate Panel   VITAMIN D 25 Hydroxy (Vit-D Deficiency, Fractures)      No orders of the defined types were placed in this encounter.  Please go to the lab today for routine laboratory studies.  We will let you know what the results show.  Recommend Podiatry referral patient declines and will  consider in the future.  Continue to follow-up with Dr. Nehemiah Massed.  Your blood pressure shows it still a little elevated, and I would recommend talking this  over with Dr. Nehemiah Massed  at your next office visit.  Continue to work on healthy diet, gradual weight loss.  You are due for a shingles vaccine and Tdap vaccine as well. You can get these at your pharmacy.  Follow-up office visit in 2 to 3 months. Follow-up: No follow-ups on file.   This visit occurred during the SARS-CoV-2 public health emergency.  Safety protocols were in place, including screening questions prior to the visit, additional usage of staff PPE, and extensive cleaning of exam room while observing appropriate contact time as indicated for disinfecting solutions.    Denice Paradise, NP

## 2020-06-07 NOTE — Patient Instructions (Addendum)
Please go to the lab today for routine laboratory studies.  We will let you know what the results show.  Continue to follow-up with Dr. Gwen Pounds.  Your blood pressure shows it still a little elevated, and I would recommend talking this over with Dr. Gwen Pounds  at your next office visit.  Continue to work on healthy diet, gradual weight loss.  You are due for a shingles vaccine and Tdap vaccine as well. You can get these at your pharmacy.  Follow-up office visit in 2 to 3 months.  Health Maintenance After Age 20 After age 42, you are at a higher risk for certain long-term diseases and infections as well as injuries from falls. Falls are a major cause of broken bones and head injuries in people who are older than age 40. Getting regular preventive care can help to keep you healthy and well. Preventive care includes getting regular testing and making lifestyle changes as recommended by your health care provider. Talk with your health care provider about:  Which screenings and tests you should have. A screening is a test that checks for a disease when you have no symptoms.  A diet and exercise plan that is right for you. What should I know about screenings and tests to prevent falls? Screening and testing are the best ways to find a health problem early. Early diagnosis and treatment give you the best chance of managing medical conditions that are common after age 56. Certain conditions and lifestyle choices may make you more likely to have a fall. Your health care provider may recommend:  Regular vision checks. Poor vision and conditions such as cataracts can make you more likely to have a fall. If you wear glasses, make sure to get your prescription updated if your vision changes.  Medicine review. Work with your health care provider to regularly review all of the medicines you are taking, including over-the-counter medicines. Ask your health care provider about any side effects that may make you  more likely to have a fall. Tell your health care provider if any medicines that you take make you feel dizzy or sleepy.  Osteoporosis screening. Osteoporosis is a condition that causes the bones to get weaker. This can make the bones weak and cause them to break more easily.  Blood pressure screening. Blood pressure changes and medicines to control blood pressure can make you feel dizzy.  Strength and balance checks. Your health care provider may recommend certain tests to check your strength and balance while standing, walking, or changing positions.  Foot health exam. Foot pain and numbness, as well as not wearing proper footwear, can make you more likely to have a fall.  Depression screening. You may be more likely to have a fall if you have a fear of falling, feel emotionally low, or feel unable to do activities that you used to do.  Alcohol use screening. Using too much alcohol can affect your balance and may make you more likely to have a fall. What actions can I take to lower my risk of falls? General instructions  Talk with your health care provider about your risks for falling. Tell your health care provider if: ? You fall. Be sure to tell your health care provider about all falls, even ones that seem minor. ? You feel dizzy, sleepy, or off-balance.  Take over-the-counter and prescription medicines only as told by your health care provider. These include any supplements.  Eat a healthy diet and maintain a healthy weight. A  healthy diet includes low-fat dairy products, low-fat (lean) meats, and fiber from whole grains, beans, and lots of fruits and vegetables. Home safety  Remove any tripping hazards, such as rugs, cords, and clutter.  Install safety equipment such as grab bars in bathrooms and safety rails on stairs.  Keep rooms and walkways well-lit. Activity   Follow a regular exercise program to stay fit. This will help you maintain your balance. Ask your health care  provider what types of exercise are appropriate for you.  If you need a cane or walker, use it as recommended by your health care provider.  Wear supportive shoes that have nonskid soles. Lifestyle  Do not drink alcohol if your health care provider tells you not to drink.  If you drink alcohol, limit how much you have: ? 0-1 drink a day for women. ? 0-2 drinks a day for men.  Be aware of how much alcohol is in your drink. In the [place] one drink equals one typical bottle of beer (12 oz), one-half glass of wine (5 oz), or one shot of hard liquor (1 oz).  Do not use any products that contain nicotine or tobacco, such as cigarettes and e-cigarettes. If you need help quitting, ask your health care provider. Summary  Having a healthy lifestyle and getting preventive care can help to protect your health and wellness after age 43.  Screening and testing are the best way to find a health problem early and help you avoid having a fall. Early diagnosis and treatment give you the best chance for managing medical conditions that are more common for people who are older than age 62.  Falls are a major cause of broken bones and head injuries in people who are older than age 10. Take precautions to prevent a fall at home.  Work with your health care provider to learn what changes you can make to improve your health and wellness and to prevent falls. This information is not intended to replace advice given to you by your health care provider. Make sure you discuss any questions you have with your health care provider. Document Revised: 03/09/2019 Document Reviewed: 09/29/2017 Elsevier Patient Education  2020 [employer].

## 2020-06-09 ENCOUNTER — Encounter: Payer: Self-pay | Admitting: Nurse Practitioner

## 2020-06-10 LAB — HEPATITIS C ANTIBODY
Hepatitis C Ab: NONREACTIVE
SIGNAL TO CUT-OFF: 0 (ref <1.00)

## 2020-06-11 ENCOUNTER — Telehealth: Payer: Self-pay | Admitting: Nurse Practitioner

## 2020-06-11 ENCOUNTER — Ambulatory Visit (INDEPENDENT_AMBULATORY_CARE_PROVIDER_SITE_OTHER): Payer: Medicare PPO

## 2020-06-11 VITALS — BP 113/65 | Ht 67.0 in | Wt 230.0 lb

## 2020-06-11 DIAGNOSIS — Z0000 Encounter for general adult medical examination without abnormal findings: Secondary | ICD-10-CM | POA: Diagnosis not present

## 2020-06-11 MED ORDER — AMLODIPINE BESYLATE 10 MG PO TABS: 10.0000 mg | ORAL_TABLET | Freq: Every day | ORAL | 1 refills | Status: DC

## 2020-06-11 NOTE — Patient Instructions (Addendum)
Mr. Pagnotta , Thank you for taking time to come for your Medicare Wellness Visit. I appreciate your ongoing commitment to your health goals. Please review the following plan we discussed and let me know if I can assist you in the future.   These are the goals we discussed: Goals      Patient Stated   .  Increase physical activity (pt-stated)      Walk as tolerated for exercise       This is a list of the screening recommended for you and due dates:  Health Maintenance  Topic Date Due  . Tetanus Vaccine  Never done  . Flu Shot  06/30/2020  . Hemoglobin A1C  12/08/2020  . Eye exam for diabetics  04/11/2021  . Complete foot exam   06/07/2021  . Colon Cancer Screening  10/07/2027  . COVID-19 Vaccine  Completed  .  Hepatitis C: One time screening is recommended by Center for Disease Control  (CDC) for  adults born from [birth year] through 1965.   Completed  . Pneumonia vaccines  Completed    Immunizations Immunization History  Administered Date(s) Administered  . Fluad Quad(high Dose 65+) 08/28/2019, 08/28/2019, 09/07/2019  . Influenza, High Dose Seasonal PF 07/31/2016  . Influenza-Unspecified 08/26/2018  . Moderna SARS-COVID-2 Vaccination 04/15/2020, 05/13/2020  . Pneumococcal Conjugate-13 10/01/2016  . Pneumococcal Polysaccharide-23 04/18/2007, 11/02/2017   Keep all routine maintenance appointments.   Follow up 08/08/20 @ 8:00  Diabetic Foot Exam- Podiatry 06/18/20 @ 3:45  Advanced directives: declined.  Conditions/risks identified: none new  Follow up in one year for your annual wellness visit.   Preventive Care 19 Years and Older, Male Preventive care refers to lifestyle choices and visits with your health care provider that can promote health and wellness. What does preventive care include?  A yearly physical exam. This is also called an annual well check.  Dental exams once or twice a year.  Routine eye exams. Ask your health care provider how often you should have  your eyes checked.  Personal lifestyle choices, including:  Daily care of your teeth and gums.  Regular physical activity.  Eating a healthy diet.  Avoiding tobacco and drug use.  Limiting alcohol use.  Practicing safe sex.  Taking low doses of aspirin every day.  Taking vitamin and mineral supplements as recommended by your health care provider. What happens during an annual well check? The services and screenings done by your health care provider during your annual well check will depend on your age, overall health, lifestyle risk factors, and family history of disease. Counseling  Your health care provider may ask you questions about your:  Alcohol use.  Tobacco use.  Drug use.  Emotional well-being.  Home and relationship well-being.  Sexual activity.  Eating habits.  History of falls.  Memory and ability to understand (cognition).  Work and work Astronomer. Screening  You may have the following tests or measurements:  Height, weight, and BMI.  Blood pressure.  Lipid and cholesterol levels. These may be checked every 5 years, or more frequently if you are over 70 years old.  Skin check.  Lung cancer screening. You may have this screening every year starting at age 72 if you have a 30-pack-year history of smoking and currently smoke or have quit within the past 15 years.  Fecal occult blood test (FOBT) of the stool. You may have this test every year starting at age 70.  Flexible sigmoidoscopy or colonoscopy. You may have a sigmoidoscopy  every 5 years or a colonoscopy every 10 years starting at age 53.  Prostate cancer screening. Recommendations will vary depending on your family history and other risks.  Hepatitis C blood test.  Hepatitis B blood test.  Sexually transmitted disease (STD) testing.  Diabetes screening. This is done by checking your blood sugar (glucose) after you have not eaten for a while (fasting). You may have this done every  1-3 years.  Abdominal aortic aneurysm (AAA) screening. You may need this if you are a current or former smoker.  Osteoporosis. You may be screened starting at age 37 if you are at high risk. Talk with your health care provider about your test results, treatment options, and if necessary, the need for more tests. Vaccines  Your health care provider may recommend certain vaccines, such as:  Influenza vaccine. This is recommended every year.  Tetanus, diphtheria, and acellular pertussis (Tdap, Td) vaccine. You may need a Td booster every 10 years.  Zoster vaccine. You may need this after age 39.  Pneumococcal 13-valent conjugate (PCV13) vaccine. One dose is recommended after age 61.  Pneumococcal polysaccharide (PPSV23) vaccine. One dose is recommended after age 22. Talk to your health care provider about which screenings and vaccines you need and how often you need them. This information is not intended to replace advice given to you by your health care provider. Make sure you discuss any questions you have with your health care provider. Document Released: 12/13/2015 Document Revised: 08/05/2016 Document Reviewed: 09/17/2015 Elsevier Interactive Patient Education  2017 [employer].  Fall Prevention in the Home Falls can cause injuries. They can happen to people of all ages. There are many things you can do to make your home safe and to help prevent falls. What can I do on the outside of my home?  Regularly fix the edges of walkways and driveways and fix any cracks.  Remove anything that might make you trip as you walk through a door, such as a raised step or threshold.  Trim any bushes or trees on the path to your home.  Use bright outdoor lighting.  Clear any walking paths of anything that might make someone trip, such as rocks or tools.  Regularly check to see if handrails are loose or broken. Make sure that both sides of any steps have handrails.  Any raised decks and  porches should have guardrails on the edges.  Have any leaves, snow, or ice cleared regularly.  Use sand or salt on walking paths during winter.  Clean up any spills in your garage right away. This includes oil or grease spills. What can I do in the bathroom?  Use night lights.  Install grab bars by the toilet and in the tub and shower. Do not use towel bars as grab bars.  Use non-skid mats or decals in the tub or shower.  If you need to sit down in the shower, use a plastic, non-slip stool.  Keep the floor dry. Clean up any water that spills on the floor as soon as it happens.  Remove soap buildup in the tub or shower regularly.  Attach bath mats securely with double-sided non-slip rug tape.  Do not have throw rugs and other things on the floor that can make you trip. What can I do in the bedroom?  Use night lights.  Make sure that you have a light by your bed that is easy to reach.  Do not use any sheets or blankets that are too  big for your bed. They should not hang down onto the floor.  Have a firm chair that has side arms. You can use this for support while you get dressed.  Do not have throw rugs and other things on the floor that can make you trip. What can I do in the kitchen?  Clean up any spills right away.  Avoid walking on wet floors.  Keep items that you use a lot in easy-to-reach places.  If you need to reach something above you, use a strong step stool that has a grab bar.  Keep electrical cords out of the way.  Do not use floor polish or wax that makes floors slippery. If you must use wax, use non-skid floor wax.  Do not have throw rugs and other things on the floor that can make you trip. What can I do with my stairs?  Do not leave any items on the stairs.  Make sure that there are handrails on both sides of the stairs and use them. Fix handrails that are broken or loose. Make sure that handrails are as long as the stairways.  Check any  carpeting to make sure that it is firmly attached to the stairs. Fix any carpet that is loose or worn.  Avoid having throw rugs at the top or bottom of the stairs. If you do have throw rugs, attach them to the floor with carpet tape.  Make sure that you have a light switch at the top of the stairs and the bottom of the stairs. If you do not have them, ask someone to add them for you. What else can I do to help prevent falls?  Wear shoes that:  Do not have high heels.  Have rubber bottoms.  Are comfortable and fit you well.  Are closed at the toe. Do not wear sandals.  If you use a stepladder:  Make sure that it is fully opened. Do not climb a closed stepladder.  Make sure that both sides of the stepladder are locked into place.  Ask someone to hold it for you, if possible.  Clearly mark and make sure that you can see:  Any grab bars or handrails.  First and last steps.  Where the edge of each step is.  Use tools that help you move around (mobility aids) if they are needed. These include:  Canes.  Walkers.  Scooters.  Crutches.  Turn on the lights when you go into a dark area. Replace any light bulbs as soon as they burn out.  Set up your furniture so you have a clear path. Avoid moving your furniture around.  If any of your floors are uneven, fix them.  If there are any pets around you, be aware of where they are.  Review your medicines with your doctor. Some medicines can make you feel dizzy. This can increase your chance of falling. Ask your doctor what other things that you can do to help prevent falls. This information is not intended to replace advice given to you by your health care provider. Make sure you discuss any questions you have with your health care provider. Document Released: 09/12/2009 Document Revised: 04/23/2016 Document Reviewed: 12/21/2014 Elsevier Interactive Patient Education  2017 [employer].

## 2020-06-11 NOTE — Telephone Encounter (Signed)
Pt states that [facility] pharmacy sent him a letter and stated that they could not refill amLODipine (NORVASC) 10 MG tablet due to no response from PCP. Please advise ASAP

## 2020-06-11 NOTE — Progress Notes (Signed)
Subjective:   Juan Watson is a 69 y.o. male who presents for an Initial Medicare Annual Wellness Visit.  Review of Systems    No ROS.  Medicare Wellness Virtual Visit.   Cardiac Risk Factors include: advanced age (>68mn, >>22women);male gender;hypertension;diabetes mellitus     Objective:    Today's Vitals   06/11/20 1312  BP: 113/65  Weight: 230 lb (104.3 kg)  Height: 5' 7" (1.702 m)   Body mass index is 36.02 kg/m.  Advanced Directives 06/11/2020 04/11/2020 02/13/2020 12/07/2019 10/23/2019 07/16/2018 12/20/2017  Does Patient Have a Medical Advance Directive? No No No No Yes No No  Type of Advance Directive - - - - HPress photographerLiving will - -  Does patient want to make changes to medical advance directive? - - - - No - Patient declined - -  Copy of HC[place]in Chart? - - - - No - copy requested - -  Would patient like information on creating a medical advance directive? No - Patient declined No - Patient declined No - Patient declined No - Patient declined - No - Patient declined No - Patient declined    Current Medications (verified) Outpatient Encounter Medications as of 06/11/2020  Medication Sig  . acetaminophen (TYLENOL) 500 MG tablet Take 1,000 mg by mouth every 6 (six) hours as needed for mild pain or headache.   . albuterol (VENTOLIN HFA) 108 (90 Base) MCG/ACT inhaler Inhale 2 puffs into the lungs every 6 (six) hours as needed for wheezing or shortness of breath.  .Marland KitchenamLODipine (NORVASC) 10 MG tablet Take 1 tablet (10 mg total) by mouth daily.  .Marland Kitchenaspirin EC 81 MG tablet Take 81 mg by mouth at bedtime.  . cloNIDine (CATAPRES) 0.1 MG tablet Take 1 tablet (0.1 mg total) by mouth 2 (two) times daily.  .Marland Kitchengemfibrozil (LOPID) 600 MG tablet Take 1 tablet (600 mg total) by mouth 2 (two) times daily.  .Marland Kitchenglucose blood test strip Use as instructed  . labetalol (NORMODYNE) 200 MG tablet Take 1 tablet (200 mg total) by mouth 2 (two) times daily.    . metFORMIN (GLUCOPHAGE) 500 MG tablet Take 1 tablet (500 mg total) by mouth 2 (two) times daily.  . Omega-3 Fatty Acids (FISH OIL) 1200 MG CAPS Take 1,200 mg by mouth daily in the afternoon.   . prasugrel (EFFIENT) 10 MG TABS tablet Take by mouth.  . telmisartan (MICARDIS) 80 MG tablet Take by mouth.  . vitamin B-12 (CYANOCOBALAMIN) 1000 MCG tablet Take 1,000 mcg by mouth daily in the afternoon.    No facility-administered encounter medications on file as of 06/11/2020.    Allergies (verified) Bee venom, Statins, Sulfa antibiotics, and Ben gay ultra [menthol]   History: Past Medical History:  Diagnosis Date  . Acquired flat foot 11/15/2015  . Arthritis of knee, degenerative 04/15/2015  . BPH (benign prostatic hyperplasia)   . Cholecystitis 06/28/2017  . [place] complication [place]   . Diabetes mellitus, type 2 ([place] 12/13/2002   Overview:  DIET CONTROLLED   . Disease of nail 07/16/2011  . Encounter for screening for malignant neoplasm of prostate 10/29/2014  . Essential (primary) hypertension 10/17/2010  . Familial aortic aneurysm 03/26/2014  . Family history of cardiovascular disease 03/26/2014  . Gastro-esophageal reflux disease without esophagitis 08/11/2013  . Generalized OA 08/11/2013  . Gout 03/26/2014  . Heart murmur   . HOH (hard of hearing)    aids  . Motion sickness    "  sea sick"  . Primary osteoarthritis of right knee 04/14/2015  . Pure hypercholesterolemia 12/13/2002  . Skin lesion 03/11/2015   Past Surgical History:  Procedure Laterality Date  . BACK SURGERY  [phone]  . CARDIAC CATHETERIZATION  70's   pericarditis  . CATARACT EXTRACTION W/PHACO Right 03/23/2016   Procedure: CATARACT EXTRACTION PHACO AND INTRAOCULAR LENS PLACEMENT (IOC);  Surgeon: Estill Cotta, MD;  Location: [facility] ORS;  Service: Ophthalmology;  Laterality: Right;  [place] 01:31   . CATARACT EXTRACTION W/PHACO Left 10/23/2019   Procedure: CATARACT EXTRACTION PHACO AND INTRAOCULAR LENS  PLACEMENT (IOC) LEFT DIABETIC TORIC LENS 5.82,   00:44.1;  Surgeon: Eulogio Bear, MD;  Location: [place];  Service: Ophthalmology;  Laterality: Left;  Diabetic - oral meds  . CHOLECYSTECTOMY N/A 06/29/2017   Procedure: LAPAROSCOPIC CHOLECYSTECTOMY;  Surgeon: Olean Ree, MD;  Location: [facility] OR[facility];  Service: General;  Laterality: N/A;  . COLONOSCOPY WITH PROPOFOL N/A 10/06/2017   Procedure: COLONOSCOPY WITH PROPOFOL;  Surgeon: Manya Silvas, MD;  Location[facility];  Service: Endoscopy;  Laterality: N/A;  . CORONARY STENT INTERVENTION N/A 04/11/2020   Procedure: CORONARY STENT INTERVENTION;  Surgeon: Yolonda Kida, MD;  Location: [place] [facility];  Service: Cardiovascular;  Laterality: N/A;  . HERNIA REPAIR  11,   umbilical x2  . KNEE ARTHROSCOPY Right 12/20/2017   Procedure: ARTHROSCOPY KNEE EXTENSIVE SYNOVECTOMY AND LYSIS OF ADHESIONS;  Surgeon: Dereck Leep, MD;  Location: [facility];  Service: Orthopedics;  Laterality: Right;  . KNEE DEBRIDEMENT Left    tendon  . LEFT HEART CATH AND CORONARY ANGIOGRAPHY Left 04/11/2020   Procedure: LEFT HEART CATH AND CORONARY ANGIOGRAPHY;  Surgeon: Corey Skains, MD;  Location: [place] [facility];  Service: Cardiovascular;  Laterality: Left;  . TONSILLECTOMY  72  . TOTAL KNEE ARTHROPLASTY Right 04/15/2015   Procedure: TOTAL KNEE ARTHROPLASTY;  Surgeon: Frederik Pear, MD;  Location: [place];  Service: Orthopedics;  Laterality: Right;  . TRANSURETHRAL RESECTION OF PROSTATE  ?   Family History  Problem Relation Age of Onset  . Multiple myeloma Mother   . Aneurysm Father   . Aneurysm Brother   . Nephrolithiasis Neg Hx   . Bladder Cancer Neg Hx   . Prostate cancer Neg Hx   . Kidney cancer Neg Hx    Social History   Socioeconomic History  . Marital status: Married    Spouse name: Not on file  . Number of children: Not on file  . Years of education: Not on file  . Highest education level: Not on file  Occupational  History  . Not on file  Tobacco Use  . Smoking status: Former Smoker    Packs/day: 2.00    Years: 25.00    Pack years: 50.00    Types: Cigarettes    Quit date: 07/15/1992    Years since quitting: 27.9  . Smokeless tobacco: Current User    Types: Chew  Vaping Use  . Vaping Use: Never used  Substance and Sexual Activity  . Alcohol use: No  . Drug use: No  . Sexual activity: Not Currently  Other Topics Concern  . Not on file  Social History Narrative   Lives at home with wife private residence   Social Determinants of Health   Financial Resource Strain: Low Risk   . Difficulty of Paying Living Expenses: Not hard at all  Food Insecurity: No Food Insecurity  . Worried About Charity fundraiser in the Last  Year: Never true  . Ran Out of Food in the Last Year: Never true  Transportation Needs: No Transportation Needs  . Lack of Transportation (Medical): No  . Lack of Transportation (Non-Medical): No  Physical Activity:   . Days of Exercise per Week:   . Minutes of Exercise per Session:   Stress: No Stress Concern Present  . Feeling of Stress : Not at all  Social Connections: Unknown  . Frequency of Communication with Friends and Family: More than three times a week  . Frequency of Social Gatherings with Friends and Family: More than three times a week  . Attends Religious Services: Not on file  . Active Member of Clubs or Organizations: Not on file  . Attends Archivist Meetings: Not on file  . Marital Status: Married    Tobacco Counseling Ready to quit: Not Answered Counseling given: Not Answered   Clinical Intake:  Pre-visit preparation completed: Yes        Diabetes: Yes (Followed by pcp)  How often do you need to have someone help you when you read instructions, pamphlets, or other written materials from your doctor or pharmacy?: 1 - Never       Activities of Daily Living In your present state of health, do you have any difficulty performing  the following activities: 06/11/2020 04/11/2020  Hearing? - -  Vision? N -  Difficulty concentrating or making decisions? N -  Walking or climbing stairs? N -  Dressing or bathing? N -  Doing errands, shopping? - N  Conservation officer, nature and eating ? N -  Using the Toilet? N -  In the past six months, have you accidently leaked urine? N -  Do you have problems with loss of bowel control? N -  Managing your Medications? Y -  Comment Wife manages -  Managing your Finances? Y -  Comment Wife manages -  Housekeeping or managing your Housekeeping? Y -  Comment Wife manages -  Some recent data might be hidden    Patient Care Team: Marval Regal, NP as PCP - General (Nurse Practitioner) Coy Saunas, MD (Family Medicine)  Indicate any recent Medical Services you may have received from other than [facility] providers in the past year (date may be approximate).     Assessment:   This is a routine wellness examination for Shirley.  I connected with Wynonia Lawman today by telephone and verified that I am speaking with the correct person using two identifiers. Location patient: home Location provider: work Persons participating in the virtual visit: patient, Marine scientist.    I discussed the limitations, risks, security and privacy concerns of performing an evaluation and management service by telephone and the availability of in person appointments. The patient expressed understanding and verbally consented to this telephonic visit.    Interactive audio and video telecommunications were attempted between this provider and patient, however failed, due to patient having technical difficulties OR patient did not have access to video capability.  We continued and completed visit with audio only.  Some vital signs may be absent or patient reported.   Hearing/Vision screen  Hearing Screening   125Hz 250Hz 500Hz 1000Hz 2000Hz 3000Hz 4000Hz 6000Hz 8000Hz  Right ear:           Left ear:           Comments: Patient is  able to hear conversational tones without difficulty.  No issues reported.  Vision Screening Comments: Followed by [facility], Dr. Jeni Salles  Wears corrective lenses Cataract extraction, bilateral No retinopathy reported Visual acuity not assessed, virtual visit.  They have seen their ophthalmologist in the last 12 months.   Dietary issues and exercise activities discussed: Current Exercise Habits: The patient does not participate in regular exercise at present  Goals      Patient Stated   .  Increase physical activity (pt-stated)      Walk as tolerated for exercise      Depression Screen PHQ 2/9 Scores 06/11/2020 02/06/2020  PHQ - 2 Score 0 0  PHQ- 9 Score - 1    Fall Risk Fall Risk  06/11/2020 02/06/2020  Falls in the past year? 0 0  Number falls in past yr: 0 0  Injury with Fall? - 0  Follow up Falls evaluation completed Falls evaluation completed    Handrails in use when climbingAny stairs in or around the home? Yes  Home free of loose throw rugs in walkways, pet beds, electrical cords, etc? Yes  Adequate lighting in your home to reduce risk of falls? Yes   ASSISTIVE DEVICES UTILIZED TO PREVENT FALLS: Life alert? No  Use of a cane, walker or w/c? Yes  Grab bars in the bathroom? No  Shower chair or bench in shower? Yes  Elevated toilet seat or a handicapped toilet? No   TIMED UP AND GO:  Was the test performed? No . Virtual visit.  Cognitive Function:     6CIT Screen 06/11/2020  What Year? 0 points  What month? 0 points  Months in reverse 0 points  Repeat phrase 0 points    Immunizations Immunization History  Administered Date(s) Administered  . Fluad Quad(high Dose 65+) 08/28/2019, 08/28/2019, 09/07/2019  . Influenza, High Dose Seasonal PF 07/31/2016  . Influenza-Unspecified 08/26/2018  . Moderna SARS-COVID-2 Vaccination 04/15/2020, 05/13/2020  . Pneumococcal Conjugate-13 10/01/2016  . Pneumococcal Polysaccharide-23 04/18/2007, 11/02/2017     TDAP status: Due, Education has been provided regarding the importance of this vaccine. Advised may receive this vaccine at local pharmacy or Health Dept. Aware to provide a copy of the vaccination record if obtained from local pharmacy or Health Dept. Verbalized acceptance and understanding. Deferred.  Health Maintenance Health Maintenance  Topic Date Due  . TETANUS/TDAP  Never done  . INFLUENZA VACCINE  06/30/2020  . HEMOGLOBIN A1C  12/08/2020  . OPHTHALMOLOGY EXAM  04/11/2021  . FOOT EXAM  06/07/2021  . COLONOSCOPY  10/07/2027  . COVID-19 Vaccine  Completed  . Hepatitis C Screening  Completed  . PNA vac Low Risk Adult  Completed   Dental Screening: Recommended annual dental exams for proper oral hygiene  Community Resource Referral / Chronic Care Management: CRR required this visit?  No   CCM required this visit?  No      Plan:   Keep all routine maintenance appointments.   Follow up 08/08/20 @ 8:00  Diabetic Foot Exam- podiatry 06/18/20 @ 3:45  I have personally reviewed and noted the following in the patient's chart:   . Medical and social history . Use of alcohol, tobacco or illicit drugs  . Current medications and supplements . Functional ability and status . Nutritional status . Physical activity . Advanced directives . List of other physicians . Hospitalizations, surgeries, and ER visits in previous 12 months . Vitals . Screenings to include cognitive, depression, and falls . Referrals and appointments  In addition, I have reviewed and discussed with patient certain preventive protocols, quality metrics, and best practice recommendations. A written personalized care plan  for preventive services as well as general preventive health recommendations were provided to patient via mychart.     Varney Biles, LPN   [id number]

## 2020-07-15 ENCOUNTER — Other Ambulatory Visit: Payer: Self-pay | Admitting: Nurse Practitioner

## 2020-07-29 ENCOUNTER — Encounter (INDEPENDENT_AMBULATORY_CARE_PROVIDER_SITE_OTHER): Payer: Self-pay | Admitting: Vascular Surgery

## 2020-07-29 ENCOUNTER — Other Ambulatory Visit: Payer: Self-pay

## 2020-07-29 ENCOUNTER — Ambulatory Visit (INDEPENDENT_AMBULATORY_CARE_PROVIDER_SITE_OTHER): Payer: Medicare PPO | Admitting: Vascular Surgery

## 2020-07-29 VITALS — BP 124/76 | HR 65 | Ht 66.0 in | Wt 227.0 lb

## 2020-07-29 DIAGNOSIS — I10 Essential (primary) hypertension: Secondary | ICD-10-CM | POA: Diagnosis not present

## 2020-07-29 DIAGNOSIS — E119 Type 2 diabetes mellitus without complications: Secondary | ICD-10-CM

## 2020-07-29 DIAGNOSIS — E782 Mixed hyperlipidemia: Secondary | ICD-10-CM

## 2020-07-29 DIAGNOSIS — I2510 Atherosclerotic heart disease of native coronary artery without angina pectoris: Secondary | ICD-10-CM

## 2020-07-29 DIAGNOSIS — I70219 Atherosclerosis of native arteries of extremities with intermittent claudication, unspecified extremity: Secondary | ICD-10-CM | POA: Insufficient documentation

## 2020-07-29 DIAGNOSIS — I70213 Atherosclerosis of native arteries of extremities with intermittent claudication, bilateral legs: Secondary | ICD-10-CM | POA: Diagnosis not present

## 2020-07-29 HISTORY — DX: Atherosclerosis of native arteries of extremities with intermittent claudication, unspecified extremity: I70.219

## 2020-07-29 NOTE — Progress Notes (Signed)
MRN : [id number]  Juan Watson is a 69 y.o. ([birth year]) male who presents with chief complaint of  Chief Complaint  Patient presents with  . New Patient (Initial Visit)    Claudication   .  History of Present Illness:    The patient is seen for evaluation of painful lower extremities and diminished pulses. Patient notes the pain is always associated with activity and is very consistent day today. Typically, the pain occurs at less than one block, progress is as activity continues to the point that the patient must stop walking. Resting including standing still for several minutes allowed resumption of the activity and the ability to walk a similar distance before stopping again. Uneven terrain and inclined shorten the distance. The pain has been progressive over the past several years. The patient states the inability to walk is now having a profound negative impact on quality of life and daily activities.  The patient denies rest pain or dangling of an extremity off the side of the bed during the night for relief. No open wounds or sores at this time. No prior interventions or surgeries.  He has a history of back problems and DJD of the lumbar sacral spine.  He is s/p Lumbar spine surgery  The patient denies changes in claudication symptoms or new rest pain symptoms.  No new ulcers or wounds of the foot.  The patient's blood pressure has been stable and relatively well controlled. The patient denies amaurosis fugax or recent TIA symptoms. There are no recent neurological changes noted. The patient denies history of DVT, PE or superficial thrombophlebitis. The patient denies recent episodes of angina or shortness of breath.   ABI's at an outside institution Rt=0.94 and Lt=0.78  Current Meds  Medication Sig  . acetaminophen (TYLENOL) 500 MG tablet Take 1,000 mg by mouth every 6 (six) hours as needed for mild pain or headache.   Marland Kitchen amLODipine (NORVASC) 10 MG tablet Take 1 tablet  (10 mg total) by mouth daily.  Marland Kitchen aspirin EC 81 MG tablet Take 81 mg by mouth at bedtime.  . chlorthalidone (HYGROTON) 25 MG tablet   . cloNIDine (CATAPRES) 0.1 MG tablet TAKE 1 TABLET (0.1 MG TOTAL) BY MOUTH 2 (TWO) TIMES DAILY.  Marland Kitchen gemfibrozil (LOPID) 600 MG tablet Take 1 tablet (600 mg total) by mouth 2 (two) times daily.  Marland Kitchen glucose blood test strip Use as instructed  . labetalol (NORMODYNE) 200 MG tablet Take 1 tablet (200 mg total) by mouth 2 (two) times daily.  . metFORMIN (GLUCOPHAGE) 500 MG tablet Take 1 tablet (500 mg total) by mouth 2 (two) times daily.  . Omega-3 Fatty Acids (FISH OIL) 1200 MG CAPS Take 1,200 mg by mouth daily in the afternoon.   Marland Kitchen omeprazole (PRILOSEC) 20 MG capsule Take 20 mg by mouth daily.  . prasugrel (EFFIENT) 10 MG TABS tablet Take by mouth.  . telmisartan (MICARDIS) 80 MG tablet Take by mouth.  . vitamin B-12 (CYANOCOBALAMIN) 1000 MCG tablet Take 1,000 mcg by mouth daily in the afternoon.     Past Medical History:  Diagnosis Date  . Acquired flat foot 11/15/2015  . Arthritis of knee, degenerative 04/15/2015  . BPH (benign prostatic hyperplasia)   . Cholecystitis 06/28/2017  . Diabetes mellitus without complication ([place])   . Diabetes mellitus, type 2 ([place]) 12/13/2002   Overview:  DIET CONTROLLED   . Disease of nail 07/16/2011  . Encounter for screening for malignant neoplasm of prostate 10/29/2014  .  Essential (primary) hypertension 10/17/2010  . Familial aortic aneurysm 03/26/2014  . Family history of cardiovascular disease 03/26/2014  . Gastro-esophageal reflux disease without esophagitis 08/11/2013  . Generalized OA 08/11/2013  . Gout 03/26/2014  . Heart murmur   . HOH (hard of hearing)    aids  . Motion sickness    "sea sick"  . Primary osteoarthritis of right knee 04/14/2015  . Pure hypercholesterolemia 12/13/2002  . Skin lesion 03/11/2015    Past Surgical History:  Procedure Laterality Date  . BACK SURGERY  [phone]  . CARDIAC CATHETERIZATION  70's    pericarditis  . CATARACT EXTRACTION W/PHACO Right 03/23/2016   Procedure: CATARACT EXTRACTION PHACO AND INTRAOCULAR LENS PLACEMENT (IOC);  Surgeon: Estill Cotta, MD;  Location: [facility];  Service: Ophthalmology;  Laterality: Right;  [place] 01:31   . CATARACT EXTRACTION W/PHACO Left 10/23/2019   Procedure: CATARACT EXTRACTION PHACO AND INTRAOCULAR LENS PLACEMENT (IOC) LEFT DIABETIC TORIC LENS 5.82,   00:44.1;  Surgeon: Eulogio Bear, MD;  Location: [facility];  Service: Ophthalmology;  Laterality: Left;  Diabetic - oral meds  . CHOLECYSTECTOMY N/A 06/29/2017   Procedure: LAPAROSCOPIC CHOLECYSTECTOMY;  Surgeon: Olean Ree, MD;  Location: [facility];  Service: General;  Laterality: N/A;  . COLONOSCOPY WITH PROPOFOL N/A 10/06/2017   Procedure: COLONOSCOPY WITH PROPOFOL;  Surgeon: Manya Silvas, MD;  Location: [facility];  Service: Endoscopy;  Laterality: N/A;  . CORONARY STENT INTERVENTION N/A 04/11/2020   Procedure: CORONARY STENT INTERVENTION;  Surgeon: Yolonda Kida, MD;  Location: [facility];  Service: Cardiovascular;  Laterality: N/A;  . HERNIA REPAIR  11,   umbilical x2  . KNEE ARTHROSCOPY Right 12/20/2017   Procedure: ARTHROSCOPY KNEE EXTENSIVE SYNOVECTOMY AND LYSIS OF ADHESIONS;  Surgeon: Dereck Leep, MD;  Location: [facility];  Service: Orthopedics;  Laterality: Right;  . KNEE DEBRIDEMENT Left    tendon  . LEFT HEART CATH AND CORONARY ANGIOGRAPHY Left 04/11/2020   Procedure: LEFT HEART CATH AND CORONARY ANGIOGRAPHY;  Surgeon: Corey Skains, MD;  Location: [facility];  Service: Cardiovascular;  Laterality: Left;  . TONSILLECTOMY  72  . TOTAL KNEE ARTHROPLASTY Right 04/15/2015   Procedure: TOTAL KNEE ARTHROPLASTY;  Surgeon: Frederik Pear, MD;  Location: [place];  Service: Orthopedics;  Laterality: Right;  . TRANSURETHRAL RESECTION OF PROSTATE  ?    Social History Social History   Tobacco Use  . Smoking status: Former Smoker     Packs/day: 2.00    Years: 25.00    Pack years: 50.00    Types: Cigarettes    Quit date: 07/15/1992    Years since quitting: 28.0  . Smokeless tobacco: Current User    Types: Chew  Vaping Use  . Vaping Use: Never used  Substance Use Topics  . Alcohol use: No  . Drug use: No    Family History Family History  Problem Relation Age of Onset  . Multiple myeloma Mother   . Aneurysm Father   . Aneurysm Brother   . Nephrolithiasis Neg Hx   . Bladder Cancer Neg Hx   . Prostate cancer Neg Hx   . Kidney cancer Neg Hx   No family history of bleeding/clotting disorders, porphyria or autoimmune disease   Allergies  Allergen Reactions  . Bee Venom Shortness Of Breath and Swelling  . Statins Other (See Comments)    Other reaction(s): SEVERE MYALGIAS  . Sulfa Antibiotics Swelling    Tongue swelling  . Ben Gay Ultra [Menthol]  Other (See Comments)    Whelts and redness of skin     REVIEW OF SYSTEMS (Negative unless checked)  Constitutional: [] Weight loss  [] Fever  [] Chills Cardiac: [x] Chest pain   [] Chest pressure   [] Palpitations   [] Shortness of breath when laying flat   [] Shortness of breath with exertion. Vascular:  [x] Pain in legs with walking   [x] Pain in legs at rest  [] History of DVT   [] Phlebitis   [] Swelling in legs   [] Varicose veins   [] Non-healing ulcers Pulmonary:   [] Uses home oxygen   [] Productive cough   [] Hemoptysis   [] Wheeze  [] COPD   [] Asthma Neurologic:  [] Dizziness   [] Seizures   [] History of stroke   [] History of TIA  [] Aphasia   [] Vissual changes   [] Weakness or numbness in arm   [] Weakness or numbness in leg Musculoskeletal:   [] Joint swelling   [x] Joint pain   [x] Low back pain Hematologic:  [] Easy bruising  [] Easy bleeding   [] Hypercoagulable state   [] Anemic Gastrointestinal:  [] Diarrhea   [] Vomiting  [] Gastroesophageal reflux/heartburn   [] Difficulty swallowing. Genitourinary:  [] Chronic kidney disease   [] Difficult urination  [] Frequent urination   [] Blood  in urine Skin:  [] Rashes   [] Ulcers  Psychological:  [] History of anxiety   []  History of major depression.  Physical Examination  Vitals:   07/29/20 1004  BP: 124/76  Pulse: 65  Weight: 227 lb (103 kg)  Height: 5' 6"  (1.676 m)   Body mass index is 36.64 kg/m. Gen: WD/WN, NAD Head: [place]/AT, No temporalis wasting.  Ear/Nose/Throat: Hearing grossly intact, nares w/o erythema or drainage, poor dentition Eyes: PER, EOMI, sclera nonicteric.  Neck: Supple, no masses.  No bruit or JVD.  Pulmonary:  Good air movement, clear to auscultation bilaterally, no use of accessory muscles.  Cardiac: RRR, normal S1, S2, no Murmurs. Vascular:  No carotid bruits Vessel Right Left  Radial Palpable Palpable  Gastrointestinal: soft, non-distended. No guarding/no peritoneal signs.  Musculoskeletal: M/S 5/5 throughout.  No deformity or atrophy.  Neurologic: CN 2-12 intact. Pain and light touch intact in extremities.  Symmetrical.  Speech is fluent. Motor exam as listed above. Psychiatric: Judgment intact, Mood & affect appropriate for pt's clinical situation. Dermatologic: No rashes or ulcers noted.  No changes consistent with cellulitis.  CBC Lab Results  Component Value Date   WBC 5.2 06/07/2020   HGB 13.3 06/07/2020   HCT 39.0 06/07/2020   MCV 83.8 06/07/2020   PLT 227.0 06/07/2020    BMET    Component Value Date/Time   NA 141 06/07/2020 0903   K 4.7 06/07/2020 0903   CL 102 06/07/2020 0903   CO2 30 06/07/2020 0903   GLUCOSE 142 (H) 06/07/2020 0903   BUN 17 06/07/2020 0903   CREATININE 1.13 06/07/2020 0903   CALCIUM 10.0 06/07/2020 0903   GFRNONAA 58 (L) 02/13/2020 1809   GFRAA >60 02/13/2020 1809   CrCl cannot be calculated (Patient's most recent lab result is older than the maximum 21 days allowed.).  COAG Lab Results  Component Value Date   INR 0.96 04/05/2015    Radiology No results found.    Assessment/Plan 1. Atherosclerosis of native artery of both lower  extremities with intermittent claudication (HCC) Recommend:  Patient should undergo arterial duplex of the lower extremity because there has been a significant deterioration in the patient's lower extremity symptoms.  The patient states they are having increased pain and a marked decrease in the distance that they can walk.  The  risks and benefits as well as the alternatives were discussed in detail with the patient.  All questions were answered.  Patient agrees to proceed and understands this could be a prelude to angiography and intervention.  The patient will follow up with me in the office to review the studies.   - VAS US AORTA/IVC/ILIACS; Future - VAS [place] LOWER EXTREMITY ARTERIAL DUPLEX; Future  2. Essential hypertension Continue antihypertensive medications as already ordered, these medications have been reviewed and there are no changes at this time.   3. Coronary artery disease involving native heart without angina pectoris, unspecified vessel or lesion type Continue cardiac and antihypertensive medications as already ordered and reviewed, no changes at this time.  Continue statin as ordered and reviewed, no changes at this time  Nitrates PRN for chest pain   4. Type 2 diabetes mellitus without complication, unspecified whether long term insulin use (HCC) Continue hypoglycemic medications as already ordered, these medications have been reviewed and there are no changes at this time.  Hgb A1C to be monitored as already arranged by primary service   5. Mixed hyperlipidemia Continue statin as ordered and reviewed, no changes at this time    Hortencia Pilar, MD  07/29/2020 10:19 AM

## 2020-08-06 ENCOUNTER — Other Ambulatory Visit: Payer: Self-pay

## 2020-08-08 ENCOUNTER — Ambulatory Visit: Payer: Medicare PPO | Admitting: Nurse Practitioner

## 2020-08-08 ENCOUNTER — Encounter: Payer: Self-pay | Admitting: Nurse Practitioner

## 2020-08-08 ENCOUNTER — Other Ambulatory Visit: Payer: Self-pay

## 2020-08-08 ENCOUNTER — Telehealth: Payer: Self-pay

## 2020-08-08 VITALS — BP 128/64 | HR 62 | Temp 98.7°F | Ht 65.98 in | Wt 226.2 lb

## 2020-08-08 DIAGNOSIS — I10 Essential (primary) hypertension: Secondary | ICD-10-CM | POA: Diagnosis not present

## 2020-08-08 DIAGNOSIS — R7989 Other specified abnormal findings of blood chemistry: Secondary | ICD-10-CM | POA: Diagnosis not present

## 2020-08-08 DIAGNOSIS — E119 Type 2 diabetes mellitus without complications: Secondary | ICD-10-CM

## 2020-08-08 DIAGNOSIS — I70213 Atherosclerosis of native arteries of extremities with intermittent claudication, bilateral legs: Secondary | ICD-10-CM

## 2020-08-08 DIAGNOSIS — F17220 Nicotine dependence, chewing tobacco, uncomplicated: Secondary | ICD-10-CM

## 2020-08-08 DIAGNOSIS — E782 Mixed hyperlipidemia: Secondary | ICD-10-CM

## 2020-08-08 DIAGNOSIS — M1A9XX1 Chronic gout, unspecified, with tophus (tophi): Secondary | ICD-10-CM

## 2020-08-08 LAB — COMPREHENSIVE METABOLIC PANEL
ALT: 16 U/L (ref 0–53)
AST: 15 U/L (ref 0–37)
Albumin: 4.8 g/dL (ref 3.5–5.2)
Alkaline Phosphatase: 41 U/L (ref 39–117)
BUN: 23 mg/dL (ref 6–23)
CO2: 27 mEq/L (ref 19–32)
Calcium: 9.8 mg/dL (ref 8.4–10.5)
Chloride: 99 mEq/L (ref 96–112)
Creatinine, Ser: 1.14 mg/dL (ref 0.40–1.50)
GFR: 63.58 mL/min (ref >60.00)
Glucose, Bld: 143 mg/dL — ABNORMAL HIGH (ref 70–99)
Potassium: 4.1 mEq/L (ref 3.5–5.1)
Sodium: 139 mEq/L (ref 135–145)
Total Bilirubin: 0.5 mg/dL (ref 0.2–1.2)
Total Protein: 6.9 g/dL (ref 6.0–8.3)

## 2020-08-08 LAB — URIC ACID: Uric Acid, Serum: 12.3 mg/dL — ABNORMAL HIGH (ref 4.0–7.8)

## 2020-08-08 LAB — TSH: TSH: 6.01 u[IU]/mL — ABNORMAL HIGH (ref 0.35–4.50)

## 2020-08-08 MED ORDER — METFORMIN HCL 500 MG PO TABS: 1000.0000 mg | ORAL_TABLET | Freq: Two times a day (BID) | ORAL | 1 refills | Status: DC

## 2020-08-08 NOTE — Chronic Care Management (AMB) (Signed)
  Chronic Care Management   Note  08/08/2020 Name: JORDEN MINCHEY MRN: [id number] DOB: 06-Mar-1951  RUEBEN KASSIM is a 69 y.o. year old male who is a primary care patient of Marval Regal, NP. I reached out to Alonza Bogus by phone today in response to a referral sent by Mr. Lockie Pares Scheidt's PCP, Denice Paradise, NP      Mr. Penkala was given information about Chronic Care Management services today including:  1. CCM service includes personalized support from designated clinical staff supervised by his physician, including individualized plan of care and coordination with other care providers 2. 24/7 contact phone numbers for assistance for urgent and routine care needs. 3. Service will only be billed when office clinical staff spend 20 minutes or more in a month to coordinate care. 4. Only one practitioner may furnish and bill the service in a calendar month. 5. The patient may stop CCM services at any time (effective at the end of the month) by phone call to the office staff. 6. The patient will be responsible for cost sharing (co-pay) of up to 20% of the service fee (after annual deductible is met).  Patient agreed to services and verbal consent obtained.   Follow up plan: Telephone appointment with care management team member scheduled for:08/30/2020  Noreene Larsson, [place] [address], [place], [place] [address] Direct Dial: [phone] Reia [email or web address]: [email or web address]

## 2020-08-08 NOTE — Progress Notes (Signed)
Established Patient Office Visit  Subjective:  Patient ID: Juan Watson, male    DOB: 14-Mar-1951  Age: 69 y.o. MRN: [id number]  CC:  Chief Complaint  Patient presents with  . Hypertension    Pt c/o high blood pressure. he states that he has high blood pressure in his right arm and low blood pressure in his left.    HPI Juan Watson is a 69 year old with history of Covid infection, CAD with stents on Effient, HLD, GERD, T2DM, OA, gout, DJD of the lumbar and sacral spine, PAD who presents for 40-monthfollow-up.  He reports his Covid fatigue is improved.  He denies any chest pain, shortness of breath DOE. His  SOB resolved after stopping Brilinta.   His main issue now is lower extremity leg pain when he walks.  He has known atherosclerosis of native artery of both lower extremities with intermittent claudication. He has arterial scan set up 08/22/20.   CAD involving native artery without angina pectoris/ Coronary artery  stent 03/2020: He is followed by Dr. BSerafina Royalsand most recently saw him on 07/10/2020.  Chlorthalidone was added to help improve blood pressure control.  He is on aspirin and Effient.  HTN: Blood pressure is at goal <130/80.Maintained on amlodipine 10 mg daily, chlorthalidone 25 mg daily, clonidine 0.1 mg twice daily, labetalol 200 mg twice daily, telmisartan 80 mg daily.  He feels well without dizziness, lightheadedness, chest pain, shortness of breath or DOE.  No edema. BP Readings from Last 3 Encounters:  08/08/20 128/64  07/29/20 124/76  06/11/20 113/65   Nicotine dependence: quit in 1993 and then picked up the chewing tobacco to stop craving to smoke.  He would like to quit using chewing tobacco and believes he can do it on his own.  T2DM: Not at goal. FBS 174 and does not eat after supper. He is doing the best he can with his DM.  Metformin 1000 mg twice daily.Podiatry consult 05/2020 for diabetic foot exam which revealed pulses present, and protective threshold  with monofilament wire grossly intact and symmetric to the forefoot and all toes. His thick toenails were trimmed.  Chronic gouty appearance to his great toes  Lab Results  Component Value Date   HGBA1C 7.0 (H) 06/07/2020    BMI 36/Obesity: Trying to eat much healthier and unhappy that he has not lost more weight.  Wt Readings from Last 3 Encounters:  08/08/20 226 lb 3.2 oz (102.6 kg)  07/29/20 227 lb (103 kg)  06/11/20 230 lb (104.3 kg)   HLD: Not at goal: LDL<70.  Reports unable to tolerate any statins.  He is on gemfibrozil 600 mg twice daily, and fish oil 1200 mg daily.   Lab Results  Component Value Date   CHOL 253 (H) 06/07/2020   HDL 29.00 (L) 06/07/2020   LDLDIRECT 125.0 06/07/2020   TRIG (H) 06/07/2020    435.0 Triglyceride is over 400; calculations on Lipids are invalid.   CHOLHDL 9 06/07/2020   Abnormal TSH: No history of hypothyroidism.  He denies any thyroid type symptoms such as temperature intolerance, dry skin, constipation, fatigue or weight gain.    History of chronic tophaceous gout and idiopathic gout involving toe of left foot: Gout in his feet are not bothersome to him.  He says he has had this for many years.  Check Uric acid level. Returns elevated at 12.  Consider uric acid lowering medication.  This SmartLink has not been configured with any valid records.  Past Medical History:  Diagnosis Date  . Acquired flat foot 11/15/2015  . Arthritis of knee, degenerative 04/15/2015  . BPH (benign prostatic hyperplasia)   . Cholecystitis 06/28/2017  . Diabetes mellitus without complication ([place])   . Diabetes mellitus, type 2 ([place]) 12/13/2002   Overview:  DIET CONTROLLED   . Disease of nail 07/16/2011  . Encounter for screening for malignant neoplasm of prostate 10/29/2014  . Essential (primary) hypertension 10/17/2010  . Familial aortic aneurysm 03/26/2014  . Family history of cardiovascular disease 03/26/2014  . Gastro-esophageal reflux disease without  esophagitis 08/11/2013  . Generalized OA 08/11/2013  . Gout 03/26/2014  . Heart murmur   . HOH (hard of hearing)    aids  . Motion sickness    "sea sick"  . Primary osteoarthritis of right knee 04/14/2015  . Pure hypercholesterolemia 12/13/2002  . Skin lesion 03/11/2015    Past Surgical History:  Procedure Laterality Date  . BACK SURGERY  [id number]  . CARDIAC CATHETERIZATION  70's   pericarditis  . CATARACT EXTRACTION W/PHACO Right 03/23/2016   Procedure: CATARACT EXTRACTION PHACO AND INTRAOCULAR LENS PLACEMENT (IOC);  Surgeon: Estill Cotta, MD;  Location: [facility];  Service: Ophthalmology;  Laterality: Right;  [place] 01:31   . CATARACT EXTRACTION W/PHACO Left 10/23/2019   Procedure: CATARACT EXTRACTION PHACO AND INTRAOCULAR LENS PLACEMENT (IOC) LEFT DIABETIC TORIC LENS 5.82,   00:44.1;  Surgeon: Eulogio Bear, MD;  Location: [place];  Service: Ophthalmology;  Laterality: Left;  Diabetic - oral meds  . CHOLECYSTECTOMY N/A 06/29/2017   Procedure: LAPAROSCOPIC CHOLECYSTECTOMY;  Surgeon: Olean Ree, MD;  Location: [facility];  Service: General;  Laterality: N/A;  . COLONOSCOPY WITH PROPOFOL N/A 10/06/2017   Procedure: COLONOSCOPY WITH PROPOFOL;  Surgeon: Manya Silvas, MD;  Location: [facility];  Service: Endoscopy;  Laterality: N/A;  . CORONARY STENT INTERVENTION N/A 04/11/2020   Procedure: CORONARY STENT INTERVENTION;  Surgeon: Yolonda Kida, MD;  Location: [place] CV LAB;  Service: Cardiovascular;  Laterality: N/A;  . HERNIA REPAIR  11,   umbilical x2  . KNEE ARTHROSCOPY Right 12/20/2017   Procedure: ARTHROSCOPY KNEE EXTENSIVE SYNOVECTOMY AND LYSIS OF ADHESIONS;  Surgeon: Dereck Leep, MD;  Location: [facility];  Service: Orthopedics;  Laterality: Right;  . KNEE DEBRIDEMENT Left    tendon  . LEFT HEART CATH AND CORONARY ANGIOGRAPHY Left 04/11/2020   Procedure: LEFT HEART CATH AND CORONARY ANGIOGRAPHY;  Surgeon: Corey Skains, MD;  Location: [place]  CV LAB;  Service: Cardiovascular;  Laterality: Left;  . TONSILLECTOMY  72  . TOTAL KNEE ARTHROPLASTY Right 04/15/2015   Procedure: TOTAL KNEE ARTHROPLASTY;  Surgeon: Frederik Pear, MD;  Location: [place];  Service: Orthopedics;  Laterality: Right;  . TRANSURETHRAL RESECTION OF PROSTATE  ?    Family History  Problem Relation Age of Onset  . Multiple myeloma Mother   . Aneurysm Father   . Aneurysm Brother   . Nephrolithiasis Neg Hx   . Bladder Cancer Neg Hx   . Prostate cancer Neg Hx   . Kidney cancer Neg Hx     Social History   Socioeconomic History  . Marital status: Married    Spouse name: Not on file  . Number of children: Not on file  . Years of education: Not on file  . Highest education level: Not on file  Occupational History  . Not on file  Tobacco Use  . Smoking status: Former Smoker    Packs/day: 2.00  Years: 25.00    Pack years: 50.00    Types: Cigarettes    Quit date: 07/15/1992    Years since quitting: 28.0  . Smokeless tobacco: Current User    Types: Chew  Vaping Use  . Vaping Use: Never used  Substance and Sexual Activity  . Alcohol use: No  . Drug use: No  . Sexual activity: Not Currently  Other Topics Concern  . Not on file  Social History Narrative   Lives at home with wife private residence   Social Determinants of Health   Financial Resource Strain: Low Risk   . Difficulty of Paying Living Expenses: Not hard at all  Food Insecurity: No Food Insecurity  . Worried About Programme researcher, broadcasting/film/video in the Last Year: Never true  . Ran Out of Food in the Last Year: Never true  Transportation Needs: No Transportation Needs  . Lack of Transportation (Medical): No  . Lack of Transportation (Non-Medical): No  Physical Activity:   . Days of Exercise per Week: Not on file  . Minutes of Exercise per Session: Not on file  Stress: No Stress Concern Present  . Feeling of Stress : Not at all  Social Connections: Unknown  . Frequency of Communication with  Friends and Family: More than three times a week  . Frequency of Social Gatherings with Friends and Family: More than three times a week  . Attends Religious Services: Not on file  . Active Member of Clubs or Organizations: Not on file  . Attends Banker Meetings: Not on file  . Marital Status: Married  Catering manager Violence: Not At Risk  . Fear of Current or Ex-Partner: No  . Emotionally Abused: No  . Physically Abused: No  . Sexually Abused: No    Outpatient Medications Prior to Visit  Medication Sig Dispense Refill  . acetaminophen (TYLENOL) 500 MG tablet Take 1,000 mg by mouth every 6 (six) hours as needed for mild pain or headache.     . albuterol (VENTOLIN HFA) 108 (90 Base) MCG/ACT inhaler Inhale 2 puffs into the lungs every 6 (six) hours as needed for wheezing or shortness of breath. 18 g 0  . amLODipine (NORVASC) 10 MG tablet Take 1 tablet (10 mg total) by mouth daily. 90 tablet 1  . aspirin EC 81 MG tablet Take 81 mg by mouth at bedtime.    . chlorthalidone (HYGROTON) 25 MG tablet     . cloNIDine (CATAPRES) 0.1 MG tablet TAKE 1 TABLET (0.1 MG TOTAL) BY MOUTH 2 (TWO) TIMES DAILY. 180 tablet 1  . gemfibrozil (LOPID) 600 MG tablet Take 1 tablet (600 mg total) by mouth 2 (two) times daily. 180 tablet 1  . glucose blood test strip Use as instructed 100 each 12  . labetalol (NORMODYNE) 200 MG tablet Take 1 tablet (200 mg total) by mouth 2 (two) times daily. 90 tablet 1  . Omega-3 Fatty Acids (FISH OIL) 1200 MG CAPS Take 1,200 mg by mouth daily in the afternoon.     Marland Kitchen omeprazole (PRILOSEC) 20 MG capsule Take 20 mg by mouth daily.    . prasugrel (EFFIENT) 10 MG TABS tablet Take by mouth.    . telmisartan (MICARDIS) 80 MG tablet Take by mouth.    . vitamin B-12 (CYANOCOBALAMIN) 1000 MCG tablet Take 1,000 mcg by mouth daily in the afternoon.     . metFORMIN (GLUCOPHAGE) 500 MG tablet Take 1 tablet (500 mg total) by mouth 2 (two) times daily. 90  tablet 1   No  facility-administered medications prior to visit.    Allergies  Allergen Reactions  . Bee Venom Shortness Of Breath and Swelling  . Statins Other (See Comments)    Other reaction(s): SEVERE MYALGIAS  . Sulfa Antibiotics Swelling    Tongue swelling  . Ben Gay Ultra [Menthol] Other (See Comments)    Whelts and redness of skin    Review of Systems Pertinent positives noted in history present illness otherwise negative.   Objective:    Physical Exam Vitals reviewed.  Constitutional:      Appearance: He is obese.  HENT:     Head: Normocephalic.  Eyes:     Conjunctiva/sclera: Conjunctivae normal.     Pupils: Pupils are equal, round, and reactive to light.  Cardiovascular:     Rate and Rhythm: Normal rate and regular rhythm.     Heart sounds: Normal heart sounds.  Pulmonary:     Effort: Pulmonary effort is normal.     Breath sounds: Normal breath sounds.  Abdominal:     Palpations: Abdomen is soft.     Tenderness: There is no abdominal tenderness.  Musculoskeletal:        General: No swelling.     Cervical back: Normal range of motion.  Skin:    General: Skin is warm and dry.  Neurological:     General: No focal deficit present.     Mental Status: He is alert and oriented to person, place, and time.  Psychiatric:        Mood and Affect: Mood normal.        Behavior: Behavior normal.     BP 128/64 (BP Location: Right Arm, Patient Position: Sitting)   Pulse 62   Temp 98.7 F (37.1 C)   Ht 5' 5.98" (1.676 m)   Wt 226 lb 3.2 oz (102.6 kg)   SpO2 97%   BMI 36.53 kg/m  Wt Readings from Last 3 Encounters:  08/08/20 226 lb 3.2 oz (102.6 kg)  07/29/20 227 lb (103 kg)  06/11/20 230 lb (104.3 kg)   Pulse Readings from Last 3 Encounters:  08/08/20 62  07/29/20 65  06/07/20 72    BP Readings from Last 3 Encounters:  08/08/20 128/64  07/29/20 124/76  06/11/20 113/65    Lab Results  Component Value Date   CHOL 253 (H) 06/07/2020   HDL 29.00 (L) 06/07/2020    LDLDIRECT 125.0 06/07/2020   TRIG (H) 06/07/2020    435.0 Triglyceride is over 400; calculations on Lipids are invalid.   CHOLHDL 9 06/07/2020      Health Maintenance Due  Topic Date Due  . INFLUENZA VACCINE  06/30/2020    There are no preventive care reminders to display for this patient.  Lab Results  Component Value Date   TSH 6.01 (H) 08/08/2020   Lab Results  Component Value Date   WBC 5.2 06/07/2020   HGB 13.3 06/07/2020   HCT 39.0 06/07/2020   MCV 83.8 06/07/2020   PLT 227.0 06/07/2020   Lab Results  Component Value Date   NA 139 08/08/2020   K 4.1 08/08/2020   CO2 27 08/08/2020   GLUCOSE 143 (H) 08/08/2020   BUN 23 08/08/2020   CREATININE 1.14 08/08/2020   BILITOT 0.5 08/08/2020   ALKPHOS 41 08/08/2020   AST 15 08/08/2020   ALT 16 08/08/2020   PROT 6.9 08/08/2020   ALBUMIN 4.8 08/08/2020   CALCIUM 9.8 08/08/2020   ANIONGAP 11 02/13/2020  GFR 63.58 08/08/2020   Lab Results  Component Value Date   CHOL 253 (H) 06/07/2020   Lab Results  Component Value Date   HDL 29.00 (L) 06/07/2020   No results found for: [facility] Lab Results  Component Value Date   TRIG (H) 06/07/2020    435.0 Triglyceride is over 400; calculations on Lipids are invalid.   Lab Results  Component Value Date   CHOLHDL 9 06/07/2020   Lab Results  Component Value Date   HGBA1C 7.0 (H) 06/07/2020      Assessment & Plan:   Problem List Items Addressed This Visit      Cardiovascular and Mediastinum   Essential hypertension - Primary    Blood pressure is at goal <130/80.Maintained on amlodipine 10 mg daily, chlorthalidone 25 mg daily, clonidine 0.1 mg twice daily, labetalol 200 mg twice daily, telmisartan 80 mg daily.       Relevant Orders   Comprehensive metabolic panel (Completed)   Atherosclerosis of native arteries of extremity with intermittent claudication [facility])   Relevant Orders   Referral to Chronic Care Management Services     Endocrine   Type 2 diabetes  mellitus without complications ([place])    Not at goal. A1c is 7. FBS 174 and does not eat after supper. He is doing the best he can with his DM.  Metformin 1000 mg twice daily.  Reports following a healthier diet plan.  Due for repeat A1c in October.       Relevant Medications   metFORMIN (GLUCOPHAGE) 500 MG tablet   Other Relevant Orders   Referral to Chronic Care Management Services   RESOLVED: Diabetes mellitus ([place])   Relevant Medications   metFORMIN (GLUCOPHAGE) 500 MG tablet   Other Relevant Orders   Referral to Chronic Care Management Services     Other   Gout   Relevant Orders   Uric acid (Completed)   Hyperlipidemia    Not at goal: LDL<70.  Reports unable to tolerate any statins.  He is on gemfibrozil 600 mg twice daily, and fish oil 1200 mg daily.  We will discuss adding a PCSK9 with his cardiologist.  Lab Results  Component Value Date   CHOL 253 (H) 06/07/2020   HDL 29.00 (L) 06/07/2020   LDLDIRECT 125.0 06/07/2020   TRIG (H) 06/07/2020    435.0 Triglyceride is over 400; calculations on Lipids are invalid.   CHOLHDL 9 06/07/2020        Relevant Orders   Referral to Chronic Care Management Services   Chewing tobacco nicotine dependence without complication   Elevated TSH   Relevant Orders   TSH (Completed)   T4, free     Meds ordered this encounter  Medications  . metFORMIN (GLUCOPHAGE) 500 MG tablet    Sig: Take 2 tablets (1,000 mg total) by mouth 2 (two) times daily.    Dispense:  90 tablet    Refill:  1    Order Specific Question:   Supervising Provider    Answer:   Einar Pheasant [[id number]]   Placed a referral into chronic care management to help with nicotine dependence, HLD, T2DM and polypharmacy.   Follow-up: Return in about 2 months (around 10/08/2020).   This visit occurred during the SARS-CoV-2 public health emergency.  Safety protocols were in place, including screening questions prior to the visit, additional usage of staff PPE, and extensive  cleaning of exam room while observing appropriate contact time as indicated for disinfecting solutions.   Denice Paradise, NP

## 2020-08-08 NOTE — Patient Instructions (Signed)
We discussed smoking we discussed stopping chewing tobacco. He want to try this on your own.  We have increased your Metformin to 2 tablets twice daily. Let me know if you develop diarrhea. Please check your fasting blood sugars about 3 or 4 times a week. You can also check blood sugar 2 hours after supper. Please write these down and bring in your record.   We are going to look into starting you on a new cholesterol medicine that is not a statin.   Your blood pressure looks good today. We will check blood work to make sure that the electrolytes are good.  You are due for a hemoglobin A1c in Nov. We will see what your increase Metformin dose to your next A1c level.  Continue to follow-up with Dr. Gilda Crease as you are doing about your leg pain.  Office visit with me in 2 months.

## 2020-08-09 ENCOUNTER — Other Ambulatory Visit (INDEPENDENT_AMBULATORY_CARE_PROVIDER_SITE_OTHER): Payer: Medicare PPO

## 2020-08-09 DIAGNOSIS — R7989 Other specified abnormal findings of blood chemistry: Secondary | ICD-10-CM

## 2020-08-09 LAB — T4, FREE: Free T4: 0.73 ng/dL (ref 0.60–1.60)

## 2020-08-10 DIAGNOSIS — F17220 Nicotine dependence, chewing tobacco, uncomplicated: Secondary | ICD-10-CM | POA: Insufficient documentation

## 2020-08-10 HISTORY — DX: Nicotine dependence, chewing tobacco, uncomplicated: F17.220

## 2020-08-10 NOTE — Assessment & Plan Note (Signed)
Not at goal: LDL<70.  Reports unable to tolerate any statins.  He is on gemfibrozil 600 mg twice daily, and fish oil 1200 mg daily.  We will discuss adding a PCSK9 with his cardiologist.  Lab Results  Component Value Date   CHOL 253 (H) 06/07/2020   HDL 29.00 (L) 06/07/2020   LDLDIRECT 125.0 06/07/2020   TRIG (H) 06/07/2020    435.0 Triglyceride is over 400; calculations on Lipids are invalid.   CHOLHDL 9 06/07/2020

## 2020-08-10 NOTE — Assessment & Plan Note (Signed)
Not at goal. A1c is 7. FBS 174 and does not eat after supper. He is doing the best he can with his DM.  Metformin 1000 mg twice daily.  Reports following a healthier diet plan.  Due for repeat A1c in October.

## 2020-08-10 NOTE — Assessment & Plan Note (Signed)
Blood pressure is at goal <130/80.Maintained on amlodipine 10 mg daily, chlorthalidone 25 mg daily, clonidine 0.1 mg twice daily, labetalol 200 mg twice daily, telmisartan 80 mg daily.

## 2020-08-11 DIAGNOSIS — R7989 Other specified abnormal findings of blood chemistry: Secondary | ICD-10-CM | POA: Insufficient documentation

## 2020-08-11 NOTE — Assessment & Plan Note (Signed)
No history of hypothyroidism.  He denies any thyroid type symptoms such as temperature intolerance, dry skin, constipation, fatigue or weight gain.  Normal free T4.  Suspect subclinical hypothyroidism.  We will follow free T4 and TSH at 6 weeks intervals.

## 2020-08-20 ENCOUNTER — Telehealth: Payer: Self-pay | Admitting: Nurse Practitioner

## 2020-08-20 MED ORDER — OMEPRAZOLE 20 MG PO CPDR: 20.0000 mg | DELAYED_RELEASE_CAPSULE | Freq: Every day | ORAL | 1 refills | Status: DC

## 2020-08-20 NOTE — Telephone Encounter (Signed)
Pharmacy called they are requesting a refill for pt on omeprazole (PRILOSEC) 20 MG capsule [employer] mail order

## 2020-08-22 ENCOUNTER — Encounter (INDEPENDENT_AMBULATORY_CARE_PROVIDER_SITE_OTHER): Payer: Self-pay | Admitting: Vascular Surgery

## 2020-08-22 ENCOUNTER — Ambulatory Visit (INDEPENDENT_AMBULATORY_CARE_PROVIDER_SITE_OTHER): Payer: Medicare PPO | Admitting: Vascular Surgery

## 2020-08-22 ENCOUNTER — Ambulatory Visit (INDEPENDENT_AMBULATORY_CARE_PROVIDER_SITE_OTHER): Payer: Medicare PPO

## 2020-08-22 ENCOUNTER — Other Ambulatory Visit: Payer: Self-pay

## 2020-08-22 VITALS — BP 126/68 | HR 55 | Ht 66.0 in | Wt 224.0 lb

## 2020-08-22 DIAGNOSIS — E782 Mixed hyperlipidemia: Secondary | ICD-10-CM

## 2020-08-22 DIAGNOSIS — I70213 Atherosclerosis of native arteries of extremities with intermittent claudication, bilateral legs: Secondary | ICD-10-CM

## 2020-08-22 DIAGNOSIS — I10 Essential (primary) hypertension: Secondary | ICD-10-CM | POA: Diagnosis not present

## 2020-08-22 DIAGNOSIS — I2510 Atherosclerotic heart disease of native coronary artery without angina pectoris: Secondary | ICD-10-CM | POA: Diagnosis not present

## 2020-08-22 DIAGNOSIS — E119 Type 2 diabetes mellitus without complications: Secondary | ICD-10-CM | POA: Diagnosis not present

## 2020-08-22 NOTE — Progress Notes (Signed)
MRN : [id number]  Juan Watson is a 69 y.o. (1951-09-30) male who presents with chief complaint of No chief complaint on file. Marland Kitchen  History of Present Illness:   The patient returns to the office for followup and review of the noninvasive studies. There have been no interval changes in lower extremity symptoms. No interval shortening of the patient's claudication distance or development of rest pain symptoms. No new ulcers or wounds have occurred since the last visit.  There have been no significant changes to the patient's overall health care.  The patient denies amaurosis fugax or recent TIA symptoms. There are no recent neurological changes noted. The patient denies history of DVT, PE or superficial thrombophlebitis. The patient denies recent episodes of angina or shortness of breath.     No outpatient medications have been marked as taking for the 08/22/20 encounter (Appointment) with Delana Meyer, Dolores Lory, MD.    Past Medical History:  Diagnosis Date  . Acquired flat foot 11/15/2015  . Arthritis of knee, degenerative 04/15/2015  . BPH (benign prostatic hyperplasia)   . Cholecystitis 06/28/2017  . Diabetes mellitus without complication ([place])   . Diabetes mellitus, type 2 ([place]) 12/13/2002   Overview:  DIET CONTROLLED   . Disease of nail 07/16/2011  . Encounter for screening for malignant neoplasm of prostate 10/29/2014  . Essential (primary) hypertension 10/17/2010  . Familial aortic aneurysm 03/26/2014  . Family history of cardiovascular disease 03/26/2014  . Gastro-esophageal reflux disease without esophagitis 08/11/2013  . Generalized OA 08/11/2013  . Gout 03/26/2014  . Heart murmur   . HOH (hard of hearing)    aids  . Motion sickness    "sea sick"  . Primary osteoarthritis of right knee 04/14/2015  . Pure hypercholesterolemia 12/13/2002  . Skin lesion 03/11/2015    Past Surgical History:  Procedure Laterality Date  . BACK SURGERY  [phone]  . CARDIAC CATHETERIZATION  70's    pericarditis  . CATARACT EXTRACTION W/PHACO Right 03/23/2016   Procedure: CATARACT EXTRACTION PHACO AND INTRAOCULAR LENS PLACEMENT (IOC);  Surgeon: Estill Cotta, MD;  Location: [facility];  Service: Ophthalmology;  Laterality: Right;  [place] 01:31   . CATARACT EXTRACTION W/PHACO Left 10/23/2019   Procedure: CATARACT EXTRACTION PHACO AND INTRAOCULAR LENS PLACEMENT (IOC) LEFT DIABETIC TORIC LENS 5.82,   00:44.1;  Surgeon: Eulogio Bear, MD;  Location: [place];  Service: Ophthalmology;  Laterality: Left;  Diabetic - oral meds  . CHOLECYSTECTOMY N/A 06/29/2017   Procedure: LAPAROSCOPIC CHOLECYSTECTOMY;  Surgeon: Olean Ree, MD;  Location: [facility];  Service: General;  Laterality: N/A;  . COLONOSCOPY WITH PROPOFOL N/A 10/06/2017   Procedure: COLONOSCOPY WITH PROPOFOL;  Surgeon: Manya Silvas, MD;  Location: [facility];  Service: Endoscopy;  Laterality: N/A;  . CORONARY STENT INTERVENTION N/A 04/11/2020   Procedure: CORONARY STENT INTERVENTION;  Surgeon: Yolonda Kida, MD;  Location: [place] [facility];  Service: Cardiovascular;  Laterality: N/A;  . HERNIA REPAIR  11,   umbilical x2  . KNEE ARTHROSCOPY Right 12/20/2017   Procedure: ARTHROSCOPY KNEE EXTENSIVE SYNOVECTOMY AND LYSIS OF ADHESIONS;  Surgeon: Dereck Leep, MD;  Location: [facility];  Service: Orthopedics;  Laterality: Right;  . KNEE DEBRIDEMENT Left    tendon  . LEFT HEART CATH AND CORONARY ANGIOGRAPHY Left 04/11/2020   Procedure: LEFT HEART CATH AND CORONARY ANGIOGRAPHY;  Surgeon: Corey Skains, MD;  Location: [place] [facility];  Service: Cardiovascular;  Laterality: Left;  . TONSILLECTOMY  72  .  TOTAL KNEE ARTHROPLASTY Right 04/15/2015   Procedure: TOTAL KNEE ARTHROPLASTY;  Surgeon: Frederik Pear, MD;  Location: [place];  Service: Orthopedics;  Laterality: Right;  . TRANSURETHRAL RESECTION OF PROSTATE  ?    Social History Social History   Tobacco Use  . Smoking status: Former Smoker    Packs/day:  2.00    Years: 25.00    Pack years: 50.00    Types: Cigarettes    Quit date: 07/15/1992    Years since quitting: 28.1  . Smokeless tobacco: Current User    Types: Chew  Vaping Use  . Vaping Use: Never used  Substance Use Topics  . Alcohol use: No  . Drug use: No    Family History Family History  Problem Relation Age of Onset  . Multiple myeloma Mother   . Aneurysm Father   . Aneurysm Brother   . Nephrolithiasis Neg Hx   . Bladder Cancer Neg Hx   . Prostate cancer Neg Hx   . Kidney cancer Neg Hx     Allergies  Allergen Reactions  . Bee Venom Shortness Of Breath and Swelling  . Statins Other (See Comments)    Other reaction(s): SEVERE MYALGIAS  . Sulfa Antibiotics Swelling    Tongue swelling  . Ben Gay Ultra [Menthol] Other (See Comments)    Whelts and redness of skin     REVIEW OF SYSTEMS (Negative unless checked)  Constitutional: _0 Weight loss  _1 Fever  _2 Chills Cardiac: _3 Chest pain   _4 Chest pressure   _5 Palpitations   _6 Shortness of breath when laying flat   _7 Shortness of breath with exertion. Vascular:  _8 Pain in legs with walking   _9 Pain in legs at rest  _10 History of DVT   _11 Phlebitis   _12 Swelling in legs   _13 Varicose veins   _14 Non-healing ulcers Pulmonary:   _15 Uses home oxygen   _16 Productive cough   _17 Hemoptysis   _18 Wheeze  _19 COPD   _20 Asthma Neurologic:  _21 Dizziness   _22 Seizures   _23 History of stroke   _24 History of TIA  _25 Aphasia   _26 Vissual changes   _27 Weakness or numbness in arm   _28 Weakness or numbness in leg Musculoskeletal:   _29 Joint swelling   _30 Joint pain   _31 Low back pain Hematologic:  _32 Easy bruising  _33 Easy bleeding   _34 Hypercoagulable state   _35 Anemic Gastrointestinal:  _36 Diarrhea   _37 Vomiting  _38 Gastroesophageal reflux/heartburn   _39 Difficulty swallowing. Genitourinary:  _40 Chronic kidney disease   _41 Difficult urination  _42 Frequent urination   _43 Blood in urine Skin:  _44 Rashes   _45 Ulcers  Psychological:  _46 History of anxiety   _47  History of major  depression.  Physical Examination  There were no vitals filed for this visit. There is no height or weight on file to calculate BMI. Gen: WD/WN, NAD Head: [place]/AT, No temporalis wasting.  Ear/Nose/Throat: Hearing grossly intact, nares w/o erythema or drainage Eyes: PER, EOMI, sclera nonicteric.  Neck: Supple, no large masses.   Pulmonary:  Good air movement, no audible wheezing bilaterally, no use of accessory muscles.  Cardiac: RRR, no JVD Vascular:  Vessel Right Left  Radial Palpable Palpable  PT Not Palpable Not Palpable  DP Not Palpable Not Palpable  Gastrointestinal: Non-distended. No guarding/no peritoneal signs.  Musculoskeletal: M/S 5/5 throughout.  No deformity or atrophy.  Neurologic: CN 2-12 intact. Symmetrical.  Speech is fluent. Motor exam as listed above. Psychiatric: Judgment intact, Mood & affect appropriate for pt's clinical situation. Dermatologic: No rashes or ulcers noted.  No changes consistent with cellulitis.   CBC Lab Results  Component  Value Date   WBC 5.2 06/07/2020   HGB 13.3 06/07/2020   HCT 39.0 06/07/2020   MCV 83.8 06/07/2020   PLT 227.0 06/07/2020    BMET    Component Value Date/Time   NA 139 08/08/2020 0855   K 4.1 08/08/2020 0855   CL 99 08/08/2020 0855   CO2 27 08/08/2020 0855   GLUCOSE 143 (H) 08/08/2020 0855   BUN 23 08/08/2020 0855   CREATININE 1.14 08/08/2020 0855   CALCIUM 9.8 08/08/2020 0855   GFRNONAA 58 (L) 02/13/2020 1809   GFRAA >60 02/13/2020 1809   Estimated Creatinine Clearance: 68.6 mL/min (by C-G formula based on SCr of 1.14 mg/dL).  COAG Lab Results  Component Value Date   INR 0.96 04/05/2015    Radiology No results found.  Assessment/Plan 1. Atherosclerosis of native artery of both lower extremities with intermittent claudication (HCC) Recommend:  The patient has experienced increased symptoms and is now describing left leg lifestyle limiting claudication and mild rest pain.   Given the severity of  the patient's lower extremity symptoms the patient should undergo left leg angiography and intervention.  Risk and benefits were reviewed the patient.  Indications for the procedure were reviewed.  All questions were answered, the patient agrees to proceed.   The patient should continue walking and begin a more formal exercise program.  The patient should continue antiplatelet therapy and aggressive treatment of the lipid abnormalities  The patient will follow up with me after the left leg angiogram.   2. Coronary artery disease involving native heart without angina pectoris, unspecified vessel or lesion type Continue cardiac and antihypertensive medications as already ordered and reviewed, no changes at this time.  Continue statin as ordered and reviewed, no changes at this time  Nitrates PRN for chest pain   3. Essential hypertension Continue antihypertensive medications as already ordered, these medications have been reviewed and there are no changes at this time.   4. Type 2 diabetes mellitus without complication, unspecified whether long term insulin use ([place]) Continue hypoglycemic medications as already ordered, these medications have been reviewed and there are no changes at this time.  Hgb A1C to be monitored as already arranged by primary service   5. Mixed hyperlipidemia Continue statin as ordered and reviewed, no changes at this time     Hortencia Pilar, MD  08/22/2020 7:38 AM

## 2020-08-26 ENCOUNTER — Telehealth (INDEPENDENT_AMBULATORY_CARE_PROVIDER_SITE_OTHER): Payer: Self-pay | Admitting: Vascular Surgery

## 2020-08-26 NOTE — Telephone Encounter (Signed)
Called stating on last visit 08-22-20 Juan Watson discussed outpatient procedure and he was calling to get it scheduled. Note is not complete unaware as to procedure discussed. Please advise.

## 2020-08-26 NOTE — Telephone Encounter (Signed)
Patient will be contacted to schedule procedure

## 2020-08-27 ENCOUNTER — Other Ambulatory Visit: Payer: Self-pay | Admitting: Nurse Practitioner

## 2020-08-28 ENCOUNTER — Other Ambulatory Visit: Payer: Self-pay | Admitting: Nurse Practitioner

## 2020-08-30 ENCOUNTER — Telehealth: Payer: Medicare PPO

## 2020-08-30 ENCOUNTER — Encounter (INDEPENDENT_AMBULATORY_CARE_PROVIDER_SITE_OTHER): Payer: Self-pay | Admitting: Vascular Surgery

## 2020-09-02 ENCOUNTER — Telehealth (INDEPENDENT_AMBULATORY_CARE_PROVIDER_SITE_OTHER): Payer: Self-pay

## 2020-09-02 NOTE — Telephone Encounter (Signed)
Spoke with the patient and he scheduled for a left leg angio with  Dr. Gilda Crease on 09/24/20 with a 6:45 am arrival time to the [facility]. Covid testing is on 09/21/20 between 8-1 pm at the [facility]. Pre-procedure instructions were discussed and will be mailed.

## 2020-09-05 ENCOUNTER — Ambulatory Visit (INDEPENDENT_AMBULATORY_CARE_PROVIDER_SITE_OTHER): Payer: Medicare PPO | Admitting: Pharmacist

## 2020-09-05 ENCOUNTER — Other Ambulatory Visit: Payer: Self-pay | Admitting: Nurse Practitioner

## 2020-09-05 DIAGNOSIS — E119 Type 2 diabetes mellitus without complications: Secondary | ICD-10-CM | POA: Diagnosis not present

## 2020-09-05 DIAGNOSIS — I10 Essential (primary) hypertension: Secondary | ICD-10-CM | POA: Diagnosis not present

## 2020-09-05 DIAGNOSIS — I2510 Atherosclerotic heart disease of native coronary artery without angina pectoris: Secondary | ICD-10-CM | POA: Diagnosis not present

## 2020-09-05 DIAGNOSIS — I70213 Atherosclerosis of native arteries of extremities with intermittent claudication, bilateral legs: Secondary | ICD-10-CM

## 2020-09-05 DIAGNOSIS — E785 Hyperlipidemia, unspecified: Secondary | ICD-10-CM | POA: Diagnosis not present

## 2020-09-05 MED ORDER — ROSUVASTATIN CALCIUM 5 MG PO TABS: 5.0000 mg | ORAL_TABLET | Freq: Every day | ORAL | 3 refills | Status: DC

## 2020-09-05 MED ORDER — DICLOFENAC SODIUM 75 MG PO TBEC: 75.0000 mg | DELAYED_RELEASE_TABLET | Freq: Two times a day (BID) | ORAL | 1 refills | Status: DC | PRN

## 2020-09-05 MED ORDER — ICOSAPENT ETHYL 1 G PO CAPS: 2.0000 g | ORAL_CAPSULE | Freq: Two times a day (BID) | ORAL | 3 refills | Status: DC

## 2020-09-05 NOTE — Patient Instructions (Signed)
Juan Watson,   It was great talking with you today!  Here is what we discussed today:   1) Start rosuvastatin 5 mg daily. This "statin" medication is one of the BEST things we can do to prevent progression of your cardiovascular disease 2) Stop gemfibrozil. This medication increases your risk of muscle pains with rosuvastatin.  3) Stop Over the Counter omega-3 fatty acids. START Vascepa 2 g twice daily. This medication is a bit more reliable "fish oil" that gives more benefit.   Moving forward, we will either  - Increase rosuvastatin if you tolerate it. Our goal LDL is <70 - If you don't tolerate rosuvastatin, we will pursue an injectable type of cholesterol medication called Repatha.   Try checking blood sugars about 2 hours after meals. Our goal A1c <7% corresponds with fasting sugars <130 and 2 hour after meal sugars <180.   We'll dig more into blood pressure and tobacco cessation moving forward.   Keep up the great work!  Juan Watson, PharmD, [place], [place] [phone]  Visit Information  Goals Addressed              This Visit's Progress     Patient Stated     PharmD "I need to better control my cholesterol" (pt-stated)        CARE PLAN ENTRY (see longitudinal plan of care for additional care plan information)  Current Barriers:   Social, financial, community barriers:  o Worried about upcoming lower extremity angiogram. Feels like he doesn't know a lot about the procedure  Uncontrolled hyperlipidemia, complicated by PAD; HTN, CAD (hx catherization in 70s, most recent elective PCI w/ DES x2 03/2020), T2DM o Current antihyperlipidemic regimen: gemfibrozil 600 mg BID, omega 3 fatty acids 1200 mg daily (OTC) o Previous antihyperlipidemic medications tried: recalls the name atorvastatin, does not remember dose. Notes muscle and joint aches/pains, but also notes concurrent "tick fever" at the time o Most recent lipid panel: LDL: 125, TG: 435, TC: 253, HDL: 20 o ASCVD risk  enhancing conditions: age >4, DM, HTN, current tobacco use  HTN: amlodipine 10 mg QAM, clonidine 0.1 mg BID, chlorthalidone 25 mg QAM, labetolol 200 mg BID, telmisartan 80 mg QPM  PAD: ASA 81 mg QPM, prasugrel 10 mg QAM o Per cath report, duration of at least 6 months DAPT recommended.   T2DM: metoformin 1000 mg BID o Fasting: reports readings ~150s, not checking post prandials  GERD: omeprazole 20 mg QAM  Arthritis: reports PRN use of diclofenac. Requests refill on this today. Per chart review, use of concurrent prasugrel + PRN diclofenac was OK per cardiology (08/22/20), as long as patient understood increased bleed risk. Using topical diclofenac PRN as well  Tobacco use: patient reports he is now using 1/2 as much chewing tobacco as he was at the time of last PCP visit. He notes he had wanted to quit by his upcoming November visit, but is unsure if he will meet that goal. Not interested in pharmacotherapy at this time.   Pharmacist Clinical Goal(s):   Over the next 90 days, patient will work with PharmD and providers towards optimized antihyperlipidemic therapy  Interventions:  Comprehensive medication review performed; medication list updated in electronic medical record.   Inter-disciplinary care team collaboration (see longitudinal plan of care)  Discussed appropriate antihyperlipidemia treatment for secondary prevention. Patient is interested in re-trying statin medication, as he is not convinced his prior joint pains were solely related to atorvastatin. Recommend starting rosuvastatin 5 mg daily. Due to DDI  and increased risk of myalgias w/ gemfibrozil, d/c gemfibrozil. D/c OTC omega 3 fatty acids d/t risk of reduced potency vs prescription formulations. Start Vascepa 2 g BID. Patient amenable to this plan. Discussed w/ PCP Dawson Bills, she is in agreement. Orders sent to [place]. If rosuvastatin tolerated, increase dose to high intensity w/ goal LDL <70, perhaps <55 given PAD  and CAD hx along w/ risk factors of DM. If statin not tolerated, recommend d/c and discuss [id number]  Briefly discussed goal A1c, goal fasting, and goal 2 hour post prandial. Encouraged patient to start checking post prandial readings as well. If needed, consider SGLT2 or GLP1 moving forward d/t ASCVD risk reduction benefit  Discussed tobacco cessation. Patient working towards cessation at this time. Encouraged to reach out if he changes his mind and would like supportive pharmacotherapy.   Counseled on increased bleed risk (of DAPT + NSAID), renal risk (ACEI + NSAID) of diclofenac. Patient using PRN at this time. Recommend continuing daily PPI while on DAPT + NSAID. Given agreement to continue use per cardiology, will pass patient's request for PO diclofenac along to PCP  Patient Self Care Activities:   Patient will focus on medication adherence  Patient will check blood glucose daily, alternating between fasting and 2 hour post prandial, document, and provide at future phone calls  Patient will commit to reducing tobacco use  Initial goal documentation        Juan Watson was given information about Chronic Care Management services today including:  1. CCM service includes personalized support from designated clinical staff supervised by his physician, including individualized plan of care and coordination with other care providers 2. 24/7 contact phone numbers for assistance for urgent and routine care needs. 3. Service will only be billed when office clinical staff spend 20 minutes or more in a month to coordinate care. 4. Only one practitioner may furnish and bill the service in a calendar month. 5. The patient may stop CCM services at any time (effective at the end of the month) by phone call to the office staff. 6. The patient will be responsible for cost sharing (co-pay) of up to 20% of the service fee (after annual deductible is met).  Patient agreed to services and verbal consent  obtained.   The patient verbalized understanding of instructions provided today and agreed to receive a mailed copy of patient instruction and/or educational materials.  Plan: - Scheduled f/u call in ~ 4 weeks  Juan Watson, PharmD, [place], [place] Pharmacist [place] [phone]

## 2020-09-05 NOTE — Chronic Care Management (AMB) (Signed)
**Note Juan-Identified via Obfuscation** Chronic Care Management   Note  09/05/2020 Name: Juan Watson MRN: [id number] DOB: 1951-03-29   Subjective:  Juan Watson is a 69 y.o. year old male who is a primary care patient of Juan Regal, NP. The CCM team was consulted for assistance with chronic disease management and care coordination needs.     Contacted patient for initial medication access and medication management support  Juan Watson was given information about Chronic Care Management services today including:  1. CCM service includes personalized support from designated clinical staff supervised by his physician, including individualized plan of care and coordination with other care providers 2. 24/7 contact phone numbers for assistance for urgent and routine care needs. 3. Service will only be billed when office clinical staff spend 20 minutes or more in a month to coordinate care. 4. Only one practitioner may furnish and bill the service in a calendar month. 5. The patient may stop CCM services at any time (effective at the end of the month) by phone call to the office staff. 6. The patient will be responsible for cost sharing (co-pay) of up to 20% of the service fee (after annual deductible is met).  Patient agreed to services and verbal consent obtained.   Review of patient status, including review of consultants reports, laboratory and other test data, was performed as part of comprehensive evaluation and provision of chronic care management services.   SDOH (Social Determinants of Health) assessments and interventions performed:  SDOH Interventions     Most Recent Value  SDOH Interventions  Financial Strain Interventions Intervention Not Indicated       Objective:  Lab Results  Component Value Date   CREATININE 1.14 08/08/2020   CREATININE 1.13 06/07/2020   CREATININE 1.25 (H) 02/13/2020    Lab Results  Component Value Date   HGBA1C 7.0 (H) 06/07/2020       Component Value Date/Time    CHOL 253 (H) 06/07/2020 0903   TRIG (H) 06/07/2020 0903    435.0 Triglyceride is over 400; calculations on Lipids are invalid.   HDL 29.00 (L) 06/07/2020 0903   CHOLHDL 9 06/07/2020 0903   LDLDIRECT 125.0 06/07/2020 0903    Clinical ASCVD: Yes  The 10-year ASCVD risk score Juan Watson., et al., 2013) is: 45.8%   Values used to calculate the score:     Age: 72 years     Sex: Male     Is Non-Hispanic African American: No     Diabetic: Yes     Tobacco smoker: No     Systolic Blood Pressure: 779 mmHg     Is BP treated: Yes     HDL Cholesterol: 29 mg/dL     Total Cholesterol: 253 mg/dL    BP Readings from Last 3 Encounters:  08/22/20 126/68  08/08/20 128/64  07/29/20 124/76    Allergies  Allergen Reactions  . Bee Venom Shortness Of Breath and Swelling  . Statins Other (See Comments)    Other reaction(s): SEVERE MYALGIAS  . Sulfa Antibiotics Swelling    Tongue swelling  . Ben Gay Ultra [Menthol] Other (See Comments)    Whelts and redness of skin    Medications Reviewed Today    Reviewed by Juan Watson, RPH-CPP (Pharmacist) on 09/05/20 at 1512  Med List Status: <None>  Medication Order Taking? Sig Documenting Provider Last Dose Status Informant  acetaminophen (TYLENOL) 500 MG tablet [id number] No Take 1,000 mg by mouth every 6 (six) hours as needed for  mild pain or headache.   Patient not taking: Reported on 08/22/2020   [provider] Not Taking Active Self  albuterol (VENTOLIN HFA) 108 (90 Base) MCG/ACT inhaler [id number] No Inhale 2 puffs into the lungs every 6 (six) hours as needed for wheezing or shortness of breath.  Patient not taking: Reported on 08/22/2020   Juan Hams, MD Not Taking Active Self  amLODipine (NORVASC) 10 MG tablet [id number] Yes Take 1 tablet (10 mg total) by mouth daily. Juan Regal, NP Taking Active   aspirin EC 81 MG tablet [id number] Yes Take 81 mg by mouth at bedtime. [provider] Taking Active Self    chlorthalidone (HYGROTON) 25 MG tablet [id number] Yes Take 25 mg by mouth daily.  [provider] Taking Active   cloNIDine (CATAPRES) 0.1 MG tablet [id number] Yes TAKE 1 TABLET TWICE DAILY Juan Regal, NP Taking Active   gemfibrozil (LOPID) 600 MG tablet [id number] Yes Take 1 tablet (600 mg total) by mouth 2 (two) times daily. Juan Regal, NP Taking Active   glucose blood test strip [id number] Yes Use as instructed Juan Regal, NP Taking Active   labetalol (NORMODYNE) 200 MG tablet [id number] Yes TAKE 1 TABLET TWICE DAILY Juan Regal, NP Taking Active   metFORMIN (GLUCOPHAGE) 500 MG tablet [id number] Yes TAKE 1 TABLET TWICE DAILY Juan Regal, NP Taking Active   Omega-3 Fatty Acids (FISH OIL) 1200 MG CAPS [id number] Yes Take 1,200 mg by mouth daily in the afternoon.  [provider] Taking Active Self  omeprazole (PRILOSEC) 20 MG capsule [id number] Yes Take 1 capsule (20 mg total) by mouth daily. Juan Regal, NP Taking Active   prasugrel (EFFIENT) 10 MG TABS tablet [id number] Yes Take 10 mg by mouth daily.  [provider] Taking Active   telmisartan (MICARDIS) 80 MG tablet [id number] Yes Take 80 mg by mouth daily.  [provider] Taking Active   vitamin B-12 (CYANOCOBALAMIN) 1000 MCG tablet [id number] Yes Take 1,000 mcg by mouth daily in the afternoon.  [provider] Taking Active Self           Assessment:   Goals Addressed              This Visit's Progress     Patient Stated   .  PharmD "I need to better control my cholesterol" (pt-stated)        CARE PLAN ENTRY (see longitudinal plan of care for additional care plan information)  Current Barriers:  . Social, financial, community barriers:  o Worried about upcoming lower extremity angiogram. Feels like he doesn't know a lot about the procedure . Uncontrolled hyperlipidemia, complicated by PAD; HTN, CAD (hx catherization in 70s, most recent elective PCI  w/ DES x2 03/2020), T2DM o Current antihyperlipidemic regimen: gemfibrozil 600 mg BID, omega 3 fatty acids 1200 mg daily (OTC) o Previous antihyperlipidemic medications tried: recalls the name atorvastatin, does not remember dose. Notes muscle and joint aches/pains, but also notes concurrent "tick fever" at the time o Most recent lipid panel: LDL: 125, TG: 435, TC: 253, HDL: 20 o ASCVD risk enhancing conditions: age >48, DM, HTN, current tobacco use . HTN: amlodipine 10 mg QAM, clonidine 0.1 mg BID, chlorthalidone 25 mg QAM, labetolol 200 mg BID, telmisartan 80 mg QPM . PAD: ASA 81 mg QPM, prasugrel 10 mg QAM o Per cath report, duration of at least 6 months DAPT recommended.  Marland Kitchen T2DM: metoformin 1000 mg BID o Fasting: reports readings ~  150s, not checking post prandials . GERD: omeprazole 20 mg QAM . Arthritis: reports PRN use of diclofenac. Requests refill on this today. Per chart review, use of concurrent prasugrel + PRN diclofenac was OK per cardiology (08/22/20), as long as patient understood increased bleed risk. Using topical diclofenac PRN as well . Tobacco use: patient reports he is now using 1/2 as much chewing tobacco as he was at the time of last PCP visit. He notes he had wanted to quit by his upcoming November visit, but is unsure if he will meet that goal. Not interested in pharmacotherapy at this time.   Pharmacist Clinical Goal(s):  Marland Kitchen Over the next 90 days, patient will work with PharmD and providers towards optimized antihyperlipidemic therapy  Interventions: . Comprehensive medication review performed; medication list updated in electronic medical record.  Bertram Savin care team collaboration (see longitudinal plan of care) . Discussed appropriate antihyperlipidemia treatment for secondary prevention. Patient is interested in re-trying statin medication, as he is not convinced his prior joint pains were solely related to atorvastatin. Recommend starting rosuvastatin 5 mg  daily. Due to DDI and increased risk of myalgias w/ gemfibrozil, d/c gemfibrozil. D/c OTC omega 3 fatty acids d/t risk of reduced potency vs prescription formulations. Start Vascepa 2 g BID. Patient amenable to this plan. Discussed w/ PCP Dawson Bills, she is in agreement. Orders sent to [facility]. If rosuvastatin tolerated, increase dose to high intensity w/ goal LDL <70, perhaps <55 given PAD and CAD hx along w/ risk factors of DM. If statin not tolerated, recommend d/c and discuss PCSK9i . Briefly discussed goal A1c, goal fasting, and goal 2 hour post prandial. Encouraged patient to start checking post prandial readings as well. If needed, consider SGLT2 or GLP1 moving forward d/t ASCVD risk reduction benefit . Discussed tobacco cessation. Patient working towards cessation at this time. Encouraged to reach out if he changes his mind and would like supportive pharmacotherapy.  . Counseled on increased bleed risk (of DAPT + NSAID), renal risk (ACEI + NSAID) of diclofenac. Patient using PRN at this time. Recommend continuing daily PPI while on DAPT + NSAID. Given agreement to continue use per cardiology, will pass patient's request for PO diclofenac along to PCP  Patient Self Care Activities:  . Patient will focus on medication adherence . Patient will check blood glucose daily, alternating between fasting and 2 hour post prandial, document, and provide at future phone calls . Patient will commit to reducing tobacco use  Initial goal documentation        Plan: - Scheduled f/u call in ~ 4 weeks  [name], PharmD, [place], CPP Clinical Pharmacist [place] [phone]

## 2020-09-05 NOTE — Progress Notes (Signed)
Refilled his diclofenac to take as needed BID.  Per chart review, use of concurrent prasugrel with as needed diclofenac was approved per cardiology (08/22/2020), as long as patient understood increase risk of bleeding.  He was counseled about this today by Catie- our clinical Pharm D.  He was also advised to take his omeprazole every day while taking this nonsteroidal anti-inflammatory.

## 2020-09-06 NOTE — Telephone Encounter (Signed)
Patient called asking about a  blood thinner that he was given by his cardiologist called Prasugrel. I explained to the patient that I was unfamiliar with this medication and to please call his cardiologist and let them know about his procedure and get a recommendation on whether to stop the medication or not. I did advise that he not take it the day of his procedure. I also spoke with Dr. Wyn Quaker and he thought my recommendation to have the patient speak with his cardiologist was fine.

## 2020-09-16 DIAGNOSIS — Z7902 Long term (current) use of antithrombotics/antiplatelets: Secondary | ICD-10-CM | POA: Diagnosis not present

## 2020-09-16 DIAGNOSIS — I10 Essential (primary) hypertension: Secondary | ICD-10-CM | POA: Diagnosis not present

## 2020-09-16 DIAGNOSIS — K219 Gastro-esophageal reflux disease without esophagitis: Secondary | ICD-10-CM | POA: Diagnosis not present

## 2020-09-16 DIAGNOSIS — E119 Type 2 diabetes mellitus without complications: Secondary | ICD-10-CM | POA: Diagnosis not present

## 2020-09-16 DIAGNOSIS — Z882 Allergy status to sulfonamides status: Secondary | ICD-10-CM | POA: Diagnosis not present

## 2020-09-16 DIAGNOSIS — I2510 Atherosclerotic heart disease of native coronary artery without angina pectoris: Secondary | ICD-10-CM | POA: Diagnosis not present

## 2020-09-16 DIAGNOSIS — M1990 Unspecified osteoarthritis, unspecified site: Secondary | ICD-10-CM | POA: Diagnosis not present

## 2020-09-16 DIAGNOSIS — E785 Hyperlipidemia, unspecified: Secondary | ICD-10-CM | POA: Diagnosis not present

## 2020-09-16 DIAGNOSIS — Z6835 Body mass index (BMI) 35.0-35.9, adult: Secondary | ICD-10-CM | POA: Diagnosis not present

## 2020-09-16 DIAGNOSIS — Z87891 Personal history of nicotine dependence: Secondary | ICD-10-CM | POA: Diagnosis not present

## 2020-09-20 ENCOUNTER — Telehealth: Payer: Self-pay

## 2020-09-20 ENCOUNTER — Other Ambulatory Visit: Payer: Self-pay

## 2020-09-20 ENCOUNTER — Other Ambulatory Visit
Admission: RE | Admit: 2020-09-20 | Discharge: 2020-09-20 | Disposition: A | Discharge status: Home / Self Care | Payer: Medicare PPO | Source: Ambulatory Visit | Attending: Vascular Surgery | Admitting: Vascular Surgery

## 2020-09-20 DIAGNOSIS — Z20822 Contact with and (suspected) exposure to covid-19: Secondary | ICD-10-CM | POA: Insufficient documentation

## 2020-09-20 DIAGNOSIS — Z01812 Encounter for preprocedural laboratory examination: Secondary | ICD-10-CM | POA: Diagnosis not present

## 2020-09-20 NOTE — Telephone Encounter (Signed)
[employer] is following up on prior authorization additional questions for Vascepa. They would like a call back.

## 2020-09-21 LAB — SARS CORONAVIRUS 2 (TAT 6-24 HRS): SARS Coronavirus 2: NEGATIVE

## 2020-09-23 ENCOUNTER — Other Ambulatory Visit (INDEPENDENT_AMBULATORY_CARE_PROVIDER_SITE_OTHER): Payer: Self-pay | Admitting: Nurse Practitioner

## 2020-09-23 ENCOUNTER — Ambulatory Visit: Payer: Medicare PPO | Admitting: Pharmacist

## 2020-09-23 DIAGNOSIS — I2510 Atherosclerotic heart disease of native coronary artery without angina pectoris: Secondary | ICD-10-CM

## 2020-09-23 DIAGNOSIS — I70213 Atherosclerosis of native arteries of extremities with intermittent claudication, bilateral legs: Secondary | ICD-10-CM

## 2020-09-23 NOTE — Chronic Care Management (AMB) (Signed)
Chronic Care Management   Follow Up Note   09/23/2020 Name: Juan Watson MRN: [id number] DOB: January 08, 1951  Referred by: Theadore Nan, NP Reason for referral : Chronic Care Management (Medication Management)   Juan Watson is a 69 y.o. year old male who is a primary care patient of Theadore Nan, NP. The CCM team was consulted for assistance with chronic disease management and care coordination needs.    Care coordination completed today.   Review of patient status, including review of consultants reports, relevant laboratory and other test results, and collaboration with appropriate care team members and the patient's provider was performed as part of comprehensive patient evaluation and provision of chronic care management services.    SDOH (Social Determinants of Health) assessments performed: No See Care Plan activities for detailed interventions related to [facility])     Outpatient Encounter Medications as of 09/23/2020  Medication Sig   acetaminophen (TYLENOL) 500 MG tablet Take 1,000 mg by mouth every 6 (six) hours as needed for mild pain or headache.    albuterol (VENTOLIN HFA) 108 (90 Base) MCG/ACT inhaler Inhale 2 puffs into the lungs every 6 (six) hours as needed for wheezing or shortness of breath. (Patient not taking: Reported on 08/22/2020)   amLODipine (NORVASC) 10 MG tablet Take 1 tablet (10 mg total) by mouth daily.   aspirin EC 81 MG tablet Take 81 mg by mouth at bedtime.   chlorthalidone (HYGROTON) 25 MG tablet Take 25 mg by mouth daily.    cloNIDine (CATAPRES) 0.1 MG tablet TAKE 1 TABLET TWICE DAILY (Patient taking differently: Take 0.1 mg by mouth 2 (two) times daily. )   diclofenac (VOLTAREN) 75 MG EC tablet Take 1 tablet (75 mg total) by mouth 2 (two) times daily as needed. (Patient taking differently: Take 75 mg by mouth daily as needed for moderate pain. )   glucose blood test strip Use as instructed   icosapent Ethyl (VASCEPA) 1 g capsule Take 2  capsules (2 g total) by mouth 2 (two) times daily.   labetalol (NORMODYNE) 200 MG tablet TAKE 1 TABLET TWICE DAILY (Patient taking differently: Take 200 mg by mouth 2 (two) times daily. )   metFORMIN (GLUCOPHAGE) 500 MG tablet TAKE 1 TABLET TWICE DAILY (Patient taking differently: Take 1,000 mg by mouth 2 (two) times daily with a meal. )   omeprazole (PRILOSEC) 20 MG capsule Take 1 capsule (20 mg total) by mouth daily.   prasugrel (EFFIENT) 10 MG TABS tablet Take 10 mg by mouth daily.    rosuvastatin (CRESTOR) 5 MG tablet Take 1 tablet (5 mg total) by mouth daily.   telmisartan (MICARDIS) 80 MG tablet Take 80 mg by mouth daily with supper.    vitamin B-12 (CYANOCOBALAMIN) 1000 MCG tablet Take 1,000 mcg by mouth daily with supper.    No facility-administered encounter medications on file as of 09/23/2020.     Objective:   Goals Addressed              This Visit's Progress     Patient Stated     PharmD "I need to better control my cholesterol" (pt-stated)        CARE PLAN ENTRY (see longitudinal plan of care for additional care plan information)  Current Barriers:   Social, financial, community barriers:  o Received message from CMA that Cover My Meds had clinical questions regarding icosapent ethyl script  Uncontrolled hyperlipidemia, complicated by PAD; HTN, CAD (hx catherization in 70s, most recent elective  PCI w/ DES x2 03/2020), T2DM o Current antihyperlipidemic regimen: rosuvastatin 5 mg daily, Vascepa 2 g BID o Previous antihyperlipidemic medications tried: recalls the name atorvastatin, does not remember dose. Notes muscle and joint aches/pains, but also notes concurrent "tick fever" at the time o Baseline lipid panel: LDL: 125, TG: 435, TC: 253, HDL: 20 o ASCVD risk enhancing conditions: age >8, DM, HTN, current tobacco use  HTN: amlodipine 10 mg QAM, clonidine 0.1 mg BID, chlorthalidone 25 mg QAM, labetolol 200 mg BID, telmisartan 80 mg QPM  PAD: ASA 81 mg QPM,  prasugrel 10 mg QAM o Per cath report, duration of at least 6 months DAPT recommended.   T2DM: metoformin 1000 mg BID  GERD: omeprazole 20 mg QAM  Arthritis: reports PRN use of diclofenac. Requests refill on this today. Per chart review, use of concurrent prasugrel + PRN diclofenac was OK per cardiology (08/22/20), as long as patient understood increased bleed risk. Using topical diclofenac PRN as well  Tobacco use: patient reports he is now using 1/2 as much chewing tobacco as he was at the time of last PCP visit.  Pharmacist Clinical Goal(s):   Over the next 90 days, patient will work with PharmD and providers towards optimized antihyperlipidemic therapy  Interventions:  When PA for Vascepa was originally initiated, Cover My Meds noted that brand Vascepa was covered. Contacted [facility] mail order pharmacy; this was confirmed and the medication was already shipped to patient. No action needed at this time  Patient Self Care Activities:   Patient will focus on medication adherence  Patient will check blood glucose daily, alternating between fasting and 2 hour post prandial, document, and provide at future phone calls  Patient will commit to reducing tobacco use  Please see past updates related to this goal by clicking on the "Past Updates" button in the selected goal          Plan:  - Will outreach as previously scheduled  Catie Feliz Beam, PharmD, [place], CPP Clinical Pharmacist [facility] [employer] [phone]

## 2020-09-23 NOTE — Progress Notes (Signed)
I started the PA on 09/05/20, but it was not required for brand Vascepa. I called the pharmacy, medication was filled and shipped to patient on 10/12. No action needed at this time.

## 2020-09-23 NOTE — Patient Instructions (Signed)
Visit Information  Goals Addressed              This Visit's Progress     Patient Stated   .  PharmD "I need to better control my cholesterol" (pt-stated)        CARE PLAN ENTRY (see longitudinal plan of care for additional care plan information)  Current Barriers:  . Social, financial, community barriers:  o Received message from [employer] that Cover My Meds had clinical questions regarding icosapent ethyl script . Uncontrolled hyperlipidemia, complicated by PAD; HTN, CAD (hx catherization in 70s, most recent elective PCI w/ DES x2 03/2020), T2DM o Current antihyperlipidemic regimen: rosuvastatin 5 mg daily, Vascepa 2 g BID o Previous antihyperlipidemic medications tried: recalls the name atorvastatin, does not remember dose. Notes muscle and joint aches/pains, but also notes concurrent "tick fever" at the time o Baseline lipid panel: LDL: 125, TG: 435, TC: 253, HDL: 20 o ASCVD risk enhancing conditions: age >15, DM, HTN, current tobacco use . HTN: amlodipine 10 mg QAM, clonidine 0.1 mg BID, chlorthalidone 25 mg QAM, labetolol 200 mg BID, telmisartan 80 mg QPM . PAD: ASA 81 mg QPM, prasugrel 10 mg QAM o Per cath report, duration of at least 6 months DAPT recommended.  Marland Kitchen T2DM: metoformin 1000 mg BID . GERD: omeprazole 20 mg QAM . Arthritis: reports PRN use of diclofenac. Requests refill on this today. Per chart review, use of concurrent prasugrel + PRN diclofenac was OK per cardiology (08/22/20), as long as patient understood increased bleed risk. Using topical diclofenac PRN as well . Tobacco use: patient reports he is now using 1/2 as much chewing tobacco as he was at the time of last PCP visit.  Pharmacist Clinical Goal(s):  Marland Kitchen Over the next 90 days, patient will work with PharmD and providers towards optimized antihyperlipidemic therapy  Interventions: . When PA for Vascepa was originally initiated, Cover My Meds noted that brand Vascepa was covered. Contacted [facility] mail order  pharmacy; this was confirmed and the medication was already shipped to patient. No action needed at this time  Patient Self Care Activities:  . Patient will focus on medication adherence . Patient will check blood glucose daily, alternating between fasting and 2 hour post prandial, document, and provide at future phone calls . Patient will commit to reducing tobacco use  Please see past updates related to this goal by clicking on the "Past Updates" button in the selected goal         The patient verbalized understanding of instructions provided today and declined a print copy of patient instruction materials.    Plan:  - Will outreach as previously scheduled  Catie Feliz Beam, PharmD, [place], CPP Clinical Pharmacist [facility] [employer] [phone]

## 2020-09-24 ENCOUNTER — Ambulatory Visit
Admission: RE | Admit: 2020-09-24 | Discharge: 2020-09-24 | Disposition: A | Discharge status: Home / Self Care | Payer: Medicare PPO | Attending: Vascular Surgery | Admitting: Vascular Surgery

## 2020-09-24 ENCOUNTER — Encounter: Payer: Self-pay | Admitting: Vascular Surgery

## 2020-09-24 ENCOUNTER — Encounter
Admission: RE | Disposition: A | Discharge status: Home / Self Care | Payer: Self-pay | Source: Home / Self Care | Attending: Vascular Surgery

## 2020-09-24 ENCOUNTER — Other Ambulatory Visit: Payer: Self-pay

## 2020-09-24 DIAGNOSIS — E1151 Type 2 diabetes mellitus with diabetic peripheral angiopathy without gangrene: Secondary | ICD-10-CM | POA: Insufficient documentation

## 2020-09-24 DIAGNOSIS — E7800 Pure hypercholesterolemia, unspecified: Secondary | ICD-10-CM | POA: Insufficient documentation

## 2020-09-24 DIAGNOSIS — Z882 Allergy status to sulfonamides status: Secondary | ICD-10-CM | POA: Insufficient documentation

## 2020-09-24 DIAGNOSIS — N400 Enlarged prostate without lower urinary tract symptoms: Secondary | ICD-10-CM | POA: Diagnosis not present

## 2020-09-24 DIAGNOSIS — Z7982 Long term (current) use of aspirin: Secondary | ICD-10-CM | POA: Insufficient documentation

## 2020-09-24 DIAGNOSIS — I719 Aortic aneurysm of unspecified site, without rupture: Secondary | ICD-10-CM | POA: Insufficient documentation

## 2020-09-24 DIAGNOSIS — I70212 Atherosclerosis of native arteries of extremities with intermittent claudication, left leg: Secondary | ICD-10-CM | POA: Diagnosis not present

## 2020-09-24 DIAGNOSIS — I10 Essential (primary) hypertension: Secondary | ICD-10-CM | POA: Diagnosis not present

## 2020-09-24 DIAGNOSIS — M109 Gout, unspecified: Secondary | ICD-10-CM | POA: Diagnosis not present

## 2020-09-24 DIAGNOSIS — Z888 Allergy status to other drugs, medicaments and biological substances status: Secondary | ICD-10-CM | POA: Insufficient documentation

## 2020-09-24 DIAGNOSIS — Z79899 Other long term (current) drug therapy: Secondary | ICD-10-CM | POA: Insufficient documentation

## 2020-09-24 DIAGNOSIS — I70222 Atherosclerosis of native arteries of extremities with rest pain, left leg: Secondary | ICD-10-CM | POA: Diagnosis not present

## 2020-09-24 DIAGNOSIS — I2510 Atherosclerotic heart disease of native coronary artery without angina pectoris: Secondary | ICD-10-CM | POA: Insufficient documentation

## 2020-09-24 DIAGNOSIS — K219 Gastro-esophageal reflux disease without esophagitis: Secondary | ICD-10-CM | POA: Insufficient documentation

## 2020-09-24 DIAGNOSIS — Z87891 Personal history of nicotine dependence: Secondary | ICD-10-CM | POA: Insufficient documentation

## 2020-09-24 DIAGNOSIS — I70219 Atherosclerosis of native arteries of extremities with intermittent claudication, unspecified extremity: Secondary | ICD-10-CM

## 2020-09-24 DIAGNOSIS — Z7984 Long term (current) use of oral hypoglycemic drugs: Secondary | ICD-10-CM | POA: Diagnosis not present

## 2020-09-24 DIAGNOSIS — E782 Mixed hyperlipidemia: Secondary | ICD-10-CM | POA: Insufficient documentation

## 2020-09-24 HISTORY — PX: LOWER EXTREMITY ANGIOGRAPHY: CATH118251

## 2020-09-24 LAB — CREATININE, SERUM
Creatinine, Ser: 1.06 mg/dL (ref 0.61–1.24)
GFR, Estimated: >60 mL/min (ref >60)

## 2020-09-24 LAB — GLUCOSE, CAPILLARY
Glucose-Capillary: 171 mg/dL — ABNORMAL HIGH (ref 70–99)
Glucose-Capillary: 147 mg/dL — ABNORMAL HIGH (ref 70–99)

## 2020-09-24 LAB — BUN: BUN: 22 mg/dL (ref 8–23)

## 2020-09-24 SURGERY — LOWER EXTREMITY ANGIOGRAPHY
Anesthesia: Moderate Sedation | Site: Leg Lower | Laterality: Left

## 2020-09-24 MED ORDER — SODIUM CHLORIDE 0.9 % IV SOLN: INTRAVENOUS | Status: DC

## 2020-09-24 MED ORDER — SODIUM CHLORIDE 0.9 % IV SOLN: 250.0000 mL | INTRAVENOUS | Status: DC | PRN

## 2020-09-24 MED ORDER — CEFAZOLIN SODIUM-DEXTROSE 2-4 GM/100ML-% IV SOLN
2.0000 g | Freq: Once | INTRAVENOUS | Status: AC
  Administered 2020-09-24: 2 g via INTRAVENOUS

## 2020-09-24 MED ORDER — FENTANYL CITRATE (PF) 100 MCG/2ML IJ SOLN
INTRAMUSCULAR | Status: DC | PRN
  Administered 2020-09-24 (×2): 50 ug via INTRAVENOUS

## 2020-09-24 MED ORDER — MIDAZOLAM HCL 2 MG/ML PO SYRP: 8.0000 mg | ORAL_SOLUTION | Freq: Once | ORAL | Status: DC | PRN

## 2020-09-24 MED ORDER — MIDAZOLAM HCL 2 MG/2ML IJ SOLN
INTRAMUSCULAR | Status: DC | PRN
  Administered 2020-09-24: 2 mg via INTRAVENOUS
  Administered 2020-09-24: 1 mg via INTRAVENOUS

## 2020-09-24 MED ORDER — DIPHENHYDRAMINE HCL 50 MG/ML IJ SOLN: 50.0000 mg | Freq: Once | INTRAMUSCULAR | Status: DC | PRN

## 2020-09-24 MED ORDER — ONDANSETRON HCL 4 MG/2ML IJ SOLN: 4.0000 mg | Freq: Four times a day (QID) | INTRAMUSCULAR | Status: DC | PRN

## 2020-09-24 MED ORDER — LABETALOL HCL 5 MG/ML IV SOLN: 10.0000 mg | INTRAVENOUS | Status: DC | PRN

## 2020-09-24 MED ORDER — SODIUM CHLORIDE 0.9% FLUSH: 3.0000 mL | Freq: Two times a day (BID) | INTRAVENOUS | Status: DC

## 2020-09-24 MED ORDER — CLOPIDOGREL BISULFATE 300 MG PO TABS: 300.0000 mg | ORAL_TABLET | ORAL | Status: DC

## 2020-09-24 MED ORDER — PRASUGREL HCL 10 MG PO TABS
10.0000 mg | ORAL_TABLET | ORAL | Status: AC
  Administered 2020-09-24: 10 mg via ORAL

## 2020-09-24 MED ORDER — FAMOTIDINE 20 MG PO TABS: 40.0000 mg | ORAL_TABLET | Freq: Once | ORAL | Status: DC | PRN

## 2020-09-24 MED ORDER — OXYCODONE HCL 5 MG PO TABS: 5.0000 mg | ORAL_TABLET | ORAL | Status: DC | PRN

## 2020-09-24 MED ORDER — HYDROMORPHONE HCL 1 MG/ML IJ SOLN: 1.0000 mg | Freq: Once | INTRAMUSCULAR | Status: DC | PRN

## 2020-09-24 MED ORDER — MORPHINE SULFATE (PF) 4 MG/ML IV SOLN: 2.0000 mg | INTRAVENOUS | Status: DC | PRN

## 2020-09-24 MED ORDER — ACETAMINOPHEN 325 MG PO TABS: 650.0000 mg | ORAL_TABLET | ORAL | Status: DC | PRN

## 2020-09-24 MED ORDER — SODIUM CHLORIDE 0.9% FLUSH: 3.0000 mL | INTRAVENOUS | Status: DC | PRN

## 2020-09-24 MED ORDER — CEFAZOLIN SODIUM-DEXTROSE 2-4 GM/100ML-% IV SOLN
INTRAVENOUS | Status: AC
  Filled 2020-09-24: qty 100

## 2020-09-24 MED ORDER — HEPARIN SODIUM (PORCINE) 1000 UNIT/ML IJ SOLN
INTRAMUSCULAR | Status: AC
  Filled 2020-09-24: qty 1

## 2020-09-24 MED ORDER — HEPARIN SODIUM (PORCINE) 1000 UNIT/ML IJ SOLN
INTRAMUSCULAR | Status: DC | PRN
  Administered 2020-09-24: 5000 [IU] via INTRAVENOUS

## 2020-09-24 MED ORDER — PRASUGREL HCL 10 MG PO TABS
ORAL_TABLET | ORAL | Status: AC
  Filled 2020-09-24: qty 1

## 2020-09-24 MED ORDER — HYDRALAZINE HCL 20 MG/ML IJ SOLN: 5.0000 mg | INTRAMUSCULAR | Status: DC | PRN

## 2020-09-24 MED ORDER — CLOPIDOGREL BISULFATE 75 MG PO TABS
75.0000 mg | ORAL_TABLET | Freq: Every day | ORAL | 4 refills | Status: DC
Start: 2020-09-24 — End: 2020-09-24

## 2020-09-24 MED ORDER — FENTANYL CITRATE (PF) 100 MCG/2ML IJ SOLN
INTRAMUSCULAR | Status: AC
  Filled 2020-09-24: qty 2

## 2020-09-24 MED ORDER — MIDAZOLAM HCL 5 MG/5ML IJ SOLN
INTRAMUSCULAR | Status: AC
  Filled 2020-09-24: qty 5

## 2020-09-24 MED ORDER — IODIXANOL 320 MG/ML IV SOLN
INTRAVENOUS | Status: DC | PRN
  Administered 2020-09-24: 65 mL via INTRA_ARTERIAL

## 2020-09-24 MED ORDER — METHYLPREDNISOLONE SODIUM SUCC 125 MG IJ SOLR: 125.0000 mg | Freq: Once | INTRAMUSCULAR | Status: DC | PRN

## 2020-09-24 SURGICAL SUPPLY — 20 items
BALLN LUTONIX DCB 6X60X130 (BALLOONS) ×3
BALLN ULTRASCORE 014 3X100X150 (BALLOONS) ×3
BALLN ULTRASCORE 4X40X130 (BALLOONS) ×3
BALLOON LUTONIX DCB 6X60X130 (BALLOONS) ×1 IMPLANT
BALLOON ULTRASCORE 4X40X130 (BALLOONS) ×1 IMPLANT
BALLOON ULTRSCRE 014 3X100X150 (BALLOONS) ×1 IMPLANT
CATH ANGIO 5F PIGTAIL 65CM (CATHETERS) ×3 IMPLANT
CATH SEEKER .035X135CM (CATHETERS) ×3 IMPLANT
DEVICE STARCLOSE SE CLOSURE (Vascular Products) ×3 IMPLANT
GLIDEWIRE ADV .035X260CM (WIRE) ×3 IMPLANT
KIT ENCORE 26 ADVANTAGE (KITS) ×3 IMPLANT
NEEDLE ENTRY 21GA 7CM ECHOTIP (NEEDLE) ×3 IMPLANT
PACK ANGIOGRAPHY (CUSTOM PROCEDURE TRAY) ×3 IMPLANT
SET INTRO CAPELLA COAXIAL (SET/KITS/TRAYS/PACK) ×3 IMPLANT
SHEATH BRITE TIP 5FRX11 (SHEATH) ×3 IMPLANT
SHEATH PINNACLE ST 6F 45CM (SHEATH) ×3 IMPLANT
STENT LIFESTENT 5F 7X60X135 (Permanent Stent) ×3 IMPLANT
TUBING CONTRAST HIGH PRESS 72 (TUBING) ×6 IMPLANT
WIRE J 3MM .035X145CM (WIRE) ×3 IMPLANT
WIRE RUNTHROUGH .014X300CM (WIRE) ×3 IMPLANT

## 2020-09-24 NOTE — Op Note (Signed)
[facility] Percutaneous Study/Intervention Procedural Note   Date of Surgery: 09/24/2020  Surgeon: Hortencia Pilar  Pre-operative Diagnosis: Atherosclerotic occlusive disease bilateral lower extremities with left lower extremity  Post-operative diagnosis: Same  Procedure(s) Performed: 1. Introduction catheter into left lower extremity 3rd order catheter placement  2. Contrast injection left lower extremity for distal runoff  3. Percutaneous transluminal angioplasty and stent placement superficial femoral and popliteal arteries to 6 mm with Lutonix drug-eluting balloon 4. Percutaneous transluminal angioplasty left peroneal to 3 mm  5. Star close closure right common femoral arteriotomy  Anesthesia: Conscious sedation was administered under my direct supervision by the interventional radiology RN. IV Versed plus fentanyl were utilized. Continuous ECG, pulse oximetry and blood pressure was monitored throughout the entire procedure.  Conscious sedation was for a total of 47 minutes.  Sheath: 6 French destination right common femoral retrograde  Contrast: 65 cc  Fluoroscopy Time: 6.8 minutes  Indications: Juan Watson presents with increasing pain of the left lower extremity.  Noninvasive studies demonstrate a focal stenosis of greater than 70% in the distal SFA proximal popliteal near Hunter's canal.  Patient's pain at rest has been increasing.  This suggests the patient is having limb threatening ischemia. The risks and benefits are reviewed all questions answered patient agrees to proceed.  Procedure:Juan Watson is a 69 y.o. y.o. male who was identified and appropriate procedural time out was performed. The patient was then placed supine on the table and prepped and draped in the usual sterile fashion.   Ultrasound was placed in the sterile sleeve and the right groin was evaluated the  right common femoral artery was echolucent and pulsatile indicating patency. Image was recorded for the permanent record and under real-time visualization a microneedle was inserted into the common femoral artery followed by the microwire and then the micro-sheath. A J-wire was then advanced through the micro-sheath and a 5 [place] sheath was then inserted over a J-wire. J-wire was then advanced and a 5 French pigtail catheter was positioned at the level of T12.  AP projection of the aorta was then obtained. Pigtail catheter was repositioned to above the bifurcation and a RAO view of the pelvis was obtained. Subsequently a pigtail catheter with the stiff angle Glidewire was used to cross the aortic bifurcation the catheter wire were advanced down into the left distal external iliac artery. Oblique view of the femoral bifurcation was then obtained and subsequently the wire was reintroduced and the pigtail catheter negotiated into the SFA representing third order catheter placement. Distal runoff was then performed.  Diagnostic interpretation: The abdominal aorta is opacified with a bolus injection contrast.  Is widely patent.  Bilateral renal arteries are noted they are widely patent.  The aortic bifurcation is widely patent.  Bilateral common external and internal iliac arteries are widely patent.  The left common femoral profunda femoris are widely patent.  Mild to moderate diffuse disease is noted but there are no hemodynamically significant stenoses.  The SFA is widely patent in its proximal one half.  Near Hunter's canal there is increasing disease with tandem lesions of greater than 80%.  The mid and distal popliteal demonstrate diffuse disease but there are no hemodynamically significant stenoses.  The trifurcation is diffusely diseased with occlusion of the anterior tibial shortly after its takeoff.  The tibioperoneal trunk and peroneal are patent but there are multiple greater than 70% stenoses in  the proximal two thirds of the peroneal including the distal tibioperoneal trunk.  The posterior tibial is occluded at its origin remains occluded throughout its entire course.  The peroneal demonstrates a large collateral that fills the pedal arch and the digital vessels.  5000 units of heparin was then given and allowed to circulate and a 6 [place] destination sheath was advanced up and over the bifurcation and positioned in the femoral artery.  The detector was then repositioned and the SFA and popliteal was reimaged confirming the tandem greater than 80% stenosis.  Initially a 4 mm x 40 mm ultra score balloon is advanced across these lesions inflated to 10 atm for approximately 1 minute.  Subsequently a 7 mm x 60 mm life stent is deployed and then postdilated with a 6 mm x 60 mm Lutonix drug-eluting balloon.  Follow-up imaging demonstrates less than 10% residual stenosis with wide patency and the distal runoff is now reassessed.  Seeker catheter is advanced over the wire and with it positioned in the distal popliteal magnified imaging of the tibioperoneal trunk and peroneal is performed by hand-injection.  0.014 run-through wire was then advanced down into the distal peroneal and a 3 mm x 100 mm ultra score balloon is advanced into the peroneal.  3 serial inflations are required to treat all of the lesions from the mid peroneal proximally.  Each inflation is to 10 atm for 1 minute.  Follow-up imaging now demonstrates wide patency of the peroneal with less than 10% residual stenosis.  After review of these images the sheath is pulled into the right external iliac oblique of the common femoral is obtained and a Star close device deployed. There no immediate Complications.  Findings:  The abdominal aorta is opacified with a bolus injection contrast.  Is widely patent.  Bilateral renal arteries are noted they are widely patent.  The aortic bifurcation is widely patent.  Bilateral common external and  internal iliac arteries are widely patent.  The left common femoral profunda femoris are widely patent.  Mild to moderate diffuse disease is noted but there are no hemodynamically significant stenoses.  The SFA is widely patent in its proximal one half.  Near Hunter's canal there is increasing disease with tandem lesions of greater than 80%.  The mid and distal popliteal demonstrate diffuse disease but there are no hemodynamically significant stenoses.  The trifurcation is diffusely diseased with occlusion of the anterior tibial shortly after its takeoff.  The tibioperoneal trunk and peroneal are patent but there are multiple greater than 70% stenoses in the proximal two thirds of the peroneal including the distal tibioperoneal trunk.  The posterior tibial is occluded at its origin remains occluded throughout its entire course.  The peroneal demonstrates a large collateral that fills the pedal arch and the digital vessels.  Following angioplasty and stent placement of the SFA and proximal popliteal there is now wide patency at Hunter's canal with less than 5% residual stenosis  Following angioplasty of the peroneal there now is in-line flow and looks quite nice with less than 10% residual stenosis.   Summary: Successful recanalization left lower extremity for limb salvage   Disposition: Patient was taken to the recovery room in stable condition having tolerated the procedure well.  Juan Watson 09/24/2020,9:42 AM

## 2020-09-24 NOTE — H&P (Signed)
@LOGO @   MRN : [id number]  Juan Watson is a 69 y.o. (July 20, 1951) male who presents with chief complaint of leg pain.  History of Present Illness:   The patient returns to the office for followup and review of the noninvasive studies. There have been no interval changes in lower extremity symptoms. No interval shortening of the patient's claudication distance or development of rest pain symptoms. No new ulcers or wounds have occurred since the last visit.  There have been no significant changes to the patient's overall health care.  The patient denies amaurosis fugax or recent TIA symptoms. There are no recent neurological changes noted. The patient denies history of DVT, PE or superficial thrombophlebitis. The patient denies recent episodes of angina or shortness of breath.   Duplex ultrasound of the left leg shows a >70% stenosis of the mid SFA  Current Meds  Medication Sig  . amLODipine (NORVASC) 10 MG tablet Take 1 tablet (10 mg total) by mouth daily.  Marland Kitchen aspirin EC 81 MG tablet Take 81 mg by mouth at bedtime.  . chlorthalidone (HYGROTON) 25 MG tablet Take 25 mg by mouth daily.   . chlorthalidone (HYGROTON) 25 MG tablet Take 25 mg by mouth daily.  . cloNIDine (CATAPRES) 0.1 MG tablet TAKE 1 TABLET TWICE DAILY (Patient taking differently: Take 0.1 mg by mouth 2 (two) times daily. )  . diclofenac (VOLTAREN) 75 MG EC tablet Take 1 tablet (75 mg total) by mouth 2 (two) times daily as needed. (Patient taking differently: Take 75 mg by mouth daily as needed for moderate pain. )  . icosapent Ethyl (VASCEPA) 1 g capsule Take 2 capsules (2 g total) by mouth 2 (two) times daily.  Marland Kitchen labetalol (NORMODYNE) 200 MG tablet TAKE 1 TABLET TWICE DAILY (Patient taking differently: Take 200 mg by mouth 2 (two) times daily. )  . metFORMIN (GLUCOPHAGE) 500 MG tablet TAKE 1 TABLET TWICE DAILY (Patient taking differently: Take 1,000 mg by mouth 2 (two) times daily with a meal. )  . omeprazole (PRILOSEC) 20  MG capsule Take 1 capsule (20 mg total) by mouth daily.  . prasugrel (EFFIENT) 10 MG TABS tablet Take 10 mg by mouth daily.   . rosuvastatin (CRESTOR) 5 MG tablet Take 1 tablet (5 mg total) by mouth daily.  Marland Kitchen telmisartan (MICARDIS) 80 MG tablet Take 80 mg by mouth daily with supper.   . vitamin B-12 (CYANOCOBALAMIN) 1000 MCG tablet Take 1,000 mcg by mouth daily with supper.     Past Medical History:  Diagnosis Date  . Acquired flat foot 11/15/2015  . Arthritis of knee, degenerative 04/15/2015  . BPH (benign prostatic hyperplasia)   . Cholecystitis 06/28/2017  . Diabetes mellitus without complication ([place])   . Diabetes mellitus, type 2 ([place]) 12/13/2002   Overview:  DIET CONTROLLED   . Disease of nail 07/16/2011  . Encounter for screening for malignant neoplasm of prostate 10/29/2014  . Essential (primary) hypertension 10/17/2010  . Familial aortic aneurysm 03/26/2014  . Family history of cardiovascular disease 03/26/2014  . Gastro-esophageal reflux disease without esophagitis 08/11/2013  . Generalized OA 08/11/2013  . Gout 03/26/2014  . Heart murmur   . HOH (hard of hearing)    aids  . Motion sickness    "sea sick"  . Primary osteoarthritis of right knee 04/14/2015  . Pure hypercholesterolemia 12/13/2002  . Skin lesion 03/11/2015    Past Surgical History:  Procedure Laterality Date  . BACK SURGERY  [phone]  . CARDIAC CATHETERIZATION  70's  pericarditis  . CATARACT EXTRACTION W/PHACO Right 03/23/2016   Procedure: CATARACT EXTRACTION PHACO AND INTRAOCULAR LENS PLACEMENT (IOC);  Surgeon: Estill Cotta, MD;  Location: [facility];  Service: Ophthalmology;  Laterality: Right;  [place] 01:31   . CATARACT EXTRACTION W/PHACO Left 10/23/2019   Procedure: CATARACT EXTRACTION PHACO AND INTRAOCULAR LENS PLACEMENT (IOC) LEFT DIABETIC TORIC LENS 5.82,   00:44.1;  Surgeon: Eulogio Bear, MD;  Location: [facility];  Service: Ophthalmology;  Laterality: Left;  Diabetic - oral meds  .  CHOLECYSTECTOMY N/A 06/29/2017   Procedure: LAPAROSCOPIC CHOLECYSTECTOMY;  Surgeon: Olean Ree, MD;  Location: [facility];  Service: General;  Laterality: N/A;  . COLONOSCOPY WITH PROPOFOL N/A 10/06/2017   Procedure: COLONOSCOPY WITH PROPOFOL;  Surgeon: Manya Silvas, MD;  Location: [facility];  Service: Endoscopy;  Laterality: N/A;  . CORONARY STENT INTERVENTION N/A 04/11/2020   Procedure: CORONARY STENT INTERVENTION;  Surgeon: Yolonda Kida, MD;  Location: [facility];  Service: Cardiovascular;  Laterality: N/A;  . HERNIA REPAIR  11,   umbilical x2  . KNEE ARTHROSCOPY Right 12/20/2017   Procedure: ARTHROSCOPY KNEE EXTENSIVE SYNOVECTOMY AND LYSIS OF ADHESIONS;  Surgeon: Dereck Leep, MD;  Location: [facility];  Service: Orthopedics;  Laterality: Right;  . KNEE DEBRIDEMENT Left    tendon  . LEFT HEART CATH AND CORONARY ANGIOGRAPHY Left 04/11/2020   Procedure: LEFT HEART CATH AND CORONARY ANGIOGRAPHY;  Surgeon: Corey Skains, MD;  Location: [place] [facility];  Service: Cardiovascular;  Laterality: Left;  . TONSILLECTOMY  72  . TOTAL KNEE ARTHROPLASTY Right 04/15/2015   Procedure: TOTAL KNEE ARTHROPLASTY;  Surgeon: Frederik Pear, MD;  Location: [place];  Service: Orthopedics;  Laterality: Right;  . TRANSURETHRAL RESECTION OF PROSTATE  ?    Social History Social History   Tobacco Use  . Smoking status: Former Smoker    Packs/day: 2.00    Years: 25.00    Pack years: 50.00    Types: Cigarettes    Quit date: 07/15/1992    Years since quitting: 28.2  . Smokeless tobacco: Current User    Types: Chew  Vaping Use  . Vaping Use: Never used  Substance Use Topics  . Alcohol use: No  . Drug use: No    Family History Family History  Problem Relation Age of Onset  . Multiple myeloma Mother   . Aneurysm Father   . Aneurysm Brother   . Nephrolithiasis Neg Hx   . Bladder Cancer Neg Hx   . Prostate cancer Neg Hx   . Kidney cancer Neg Hx     Allergies  Allergen  Reactions  . Bee Venom Shortness Of Breath and Swelling  . Statins Other (See Comments)    Other reaction(s): SEVERE MYALGIAS  . Sulfa Antibiotics Swelling    Tongue swelling  . Ben Gay Ultra [Menthol] Other (See Comments)    Whelts and redness of skin     REVIEW OF SYSTEMS (Negative unless checked)  Constitutional: [] Weight loss  [] Fever  [] Chills Cardiac: [] Chest pain   [] Chest pressure   [] Palpitations   [] Shortness of breath when laying flat   [] Shortness of breath with exertion. Vascular:  [x] Pain in legs with walking   [] Pain in legs at rest  [] History of DVT   [] Phlebitis   [x] Swelling in legs   [] Varicose veins   [] Non-healing ulcers Pulmonary:   [] Uses home oxygen   [] Productive cough   [] Hemoptysis   [] Wheeze  [] COPD   [] Asthma Neurologic:  []   Dizziness   [] Seizures   [] History of stroke   [] History of TIA  [] Aphasia   [] Vissual changes   [] Weakness or numbness in arm   [] Weakness or numbness in leg Musculoskeletal:   [] Joint swelling   [x] Joint pain   [] Low back pain Hematologic:  [] Easy bruising  [] Easy bleeding   [] Hypercoagulable state   [] Anemic Gastrointestinal:  [] Diarrhea   [] Vomiting  [] Gastroesophageal reflux/heartburn   [] Difficulty swallowing. Genitourinary:  [] Chronic kidney disease   [] Difficult urination  [] Frequent urination   [] Blood in urine Skin:  [] Rashes   [] Ulcers  Psychological:  [] History of anxiety   []  History of major depression.  Physical Examination  Vitals:   09/24/20 0739  BP: 114/67  Pulse: 61  Resp: 20  Temp: 97.9 F (36.6 C)  TempSrc: Oral  SpO2: 100%  Weight: 102.1 kg  Height: 5' 6"  (1.676 m)   Body mass index is 36.32 kg/m. Gen: WD/WN, NAD Head: [place]/AT, No temporalis wasting.  Ear/Nose/Throat: Hearing grossly intact, nares w/o erythema or drainage Eyes: PER, EOMI, sclera nonicteric.  Neck: Supple, no large masses.   Pulmonary:  Good air movement, no audible wheezing bilaterally, no use of accessory muscles.  Cardiac: RRR, no  JVD Vascular:  Vessel Right Left  Radial Palpable Palpable  PT Palpable Not Palpable  DP Palpable Not Palpable  Gastrointestinal: Non-distended. No guarding/no peritoneal signs.  Musculoskeletal: M/S 5/5 throughout.  No deformity or atrophy.  Neurologic: CN 2-12 intact. Symmetrical.  Speech is fluent. Motor exam as listed above. Psychiatric: Judgment intact, Mood & affect appropriate for pt's clinical situation. Dermatologic: No rashes or ulcers noted.  No changes consistent with cellulitis.    CBC Lab Results  Component Value Date   WBC 5.2 06/07/2020   HGB 13.3 06/07/2020   HCT 39.0 06/07/2020   MCV 83.8 06/07/2020   PLT 227.0 06/07/2020    BMET    Component Value Date/Time   NA 139 08/08/2020 0855   K 4.1 08/08/2020 0855   CL 99 08/08/2020 0855   CO2 27 08/08/2020 0855   GLUCOSE 143 (H) 08/08/2020 0855   BUN 22 09/24/2020 0733   CREATININE 1.06 09/24/2020 0733   CALCIUM 9.8 08/08/2020 0855   GFRNONAA >60 09/24/2020 0733   GFRAA >60 02/13/2020 1809   Estimated Creatinine Clearance: 73.6 mL/min (by C-G formula based on SCr of 1.06 mg/dL).  COAG Lab Results  Component Value Date   INR 0.96 04/05/2015    Radiology No results found.   Assessment/Plan 1. Atherosclerosis of native artery of both lower extremities with intermittent claudication (HCC) Recommend:  The patient has experienced increased symptoms and is now describing left leg lifestyle limiting claudication and mild rest pain.   Given the severity of the patient's lower extremity symptoms the patient should undergo left leg angiography and intervention.  Risk and benefits were reviewed the patient.  Indications for the procedure were reviewed.  All questions were answered, the patient agrees to proceed.   The patient should continue walking and begin a more formal exercise program.  The patient should continue antiplatelet therapy and aggressive treatment of the lipid abnormalities  The  patient will follow up with me after the left leg angiogram.   2. Coronary artery disease involving native heart without angina pectoris, unspecified vessel or lesion type Continue cardiac and antihypertensive medications as already ordered and reviewed, no changes at this time.  Continue statin as ordered and reviewed, no changes at this time  Nitrates PRN for chest pain  3. Essential hypertension Continue antihypertensive medications as already ordered, these medications have been reviewed and there are no changes at this time.   4. Type 2 diabetes mellitus without complication, unspecified whether long term insulin use ([place]) Continue hypoglycemic medications as already ordered, these medications have been reviewed and there are no changes at this time.  Hgb A1C to be monitored as already arranged by primary service   5. Mixed hyperlipidemia Continue statin as ordered and reviewed, no changes at this time    Hortencia Pilar, MD  09/24/2020 7:58 AM

## 2020-09-24 NOTE — Interval H&P Note (Signed)
History and Physical Interval Note:  09/24/2020 8:22 AM  Juan Watson  has presented today for surgery, with the diagnosis of LT lower extremity angio   BARD   ASO w claudication Covid Oct 23.  The various methods of treatment have been discussed with the patient and family. After consideration of risks, benefits and other options for treatment, the patient has consented to  Procedure(s): LOWER EXTREMITY ANGIOGRAPHY (Left) as a surgical intervention.  The patient's history has been reviewed, patient examined, no change in status, stable for surgery.  I have reviewed the patient's chart and labs.  Questions were answered to the patient's satisfaction.     [name]

## 2020-09-27 ENCOUNTER — Telehealth (INDEPENDENT_AMBULATORY_CARE_PROVIDER_SITE_OTHER): Payer: Self-pay

## 2020-09-30 NOTE — Telephone Encounter (Signed)
Effient alone should be sufficient

## 2020-09-30 NOTE — Telephone Encounter (Signed)
Patient wife has been made aware with medical advice

## 2020-10-04 ENCOUNTER — Ambulatory Visit: Payer: Medicare PPO | Admitting: Pharmacist

## 2020-10-04 DIAGNOSIS — I70213 Atherosclerosis of native arteries of extremities with intermittent claudication, bilateral legs: Secondary | ICD-10-CM

## 2020-10-04 DIAGNOSIS — E785 Hyperlipidemia, unspecified: Secondary | ICD-10-CM

## 2020-10-04 DIAGNOSIS — I2510 Atherosclerotic heart disease of native coronary artery without angina pectoris: Secondary | ICD-10-CM

## 2020-10-04 DIAGNOSIS — F17220 Nicotine dependence, chewing tobacco, uncomplicated: Secondary | ICD-10-CM

## 2020-10-04 DIAGNOSIS — E119 Type 2 diabetes mellitus without complications: Secondary | ICD-10-CM

## 2020-10-04 MED ORDER — METFORMIN HCL 500 MG PO TABS: 1000.0000 mg | ORAL_TABLET | Freq: Two times a day (BID) | ORAL | 3 refills | Status: AC

## 2020-10-04 NOTE — Chronic Care Management (AMB) (Signed)
Chronic Care Management   Follow Up Note   10/04/2020 Name: Juan Watson MRN: [id number] DOB: Mar 17, 1951  Referred by: Theadore Nan, NP Reason for referral : Chronic Care Management (Medication Management)   Juan Watson is a 69 y.o. year old male who is a primary care patient of Theadore Nan, NP. The CCM team was consulted for assistance with chronic disease management and care coordination needs.    Review of patient status, including review of consultants reports, relevant laboratory and other test results, and collaboration with appropriate care team members and the patient's provider was performed as part of comprehensive patient evaluation and provision of chronic care management services.    SDOH (Social Determinants of Health) assessments performed: Yes See Care Plan activities for detailed interventions related to SDOH)  SDOH Interventions     Most Recent Value  SDOH Interventions  SDOH Interventions for the Following Domains Tobacco  Tobacco Interventions Cessation Materials Given and Reviewed       Outpatient Encounter Medications as of 10/04/2020  Medication Sig  . amLODipine (NORVASC) 10 MG tablet Take 1 tablet (10 mg total) by mouth daily.  Marland Kitchen aspirin EC 81 MG tablet Take 81 mg by mouth at bedtime.  . chlorthalidone (HYGROTON) 25 MG tablet Take 25 mg by mouth daily.  . cloNIDine (CATAPRES) 0.1 MG tablet TAKE 1 TABLET TWICE DAILY (Patient taking differently: Take 0.1 mg by mouth 2 (two) times daily. )  . diclofenac (VOLTAREN) 75 MG EC tablet Take 1 tablet (75 mg total) by mouth 2 (two) times daily as needed. (Patient taking differently: Take 75 mg by mouth daily as needed for moderate pain. )  . glucose blood test strip Use as instructed  . icosapent Ethyl (VASCEPA) 1 g capsule Take 2 capsules (2 g total) by mouth 2 (two) times daily.  Marland Kitchen labetalol (NORMODYNE) 200 MG tablet TAKE 1 TABLET TWICE DAILY (Patient taking differently: Take 200 mg by mouth 2 (two)  times daily. )  . metFORMIN (GLUCOPHAGE) 500 MG tablet Take 2 tablets (1,000 mg total) by mouth 2 (two) times daily with a meal.  . omeprazole (PRILOSEC) 20 MG capsule Take 1 capsule (20 mg total) by mouth daily.  . prasugrel (EFFIENT) 10 MG TABS tablet Take 10 mg by mouth daily.   . rosuvastatin (CRESTOR) 5 MG tablet Take 1 tablet (5 mg total) by mouth daily.  Marland Kitchen telmisartan (MICARDIS) 80 MG tablet Take 80 mg by mouth daily with supper.   . vitamin B-12 (CYANOCOBALAMIN) 1000 MCG tablet Take 1,000 mcg by mouth daily with supper.   . [DISCONTINUED] chlorthalidone (HYGROTON) 25 MG tablet Take 25 mg by mouth daily.   . [DISCONTINUED] metFORMIN (GLUCOPHAGE) 500 MG tablet TAKE 1 TABLET TWICE DAILY (Patient taking differently: Take 1,000 mg by mouth 2 (two) times daily with a meal. )  . acetaminophen (TYLENOL) 500 MG tablet Take 1,000 mg by mouth every 6 (six) hours as needed for mild pain or headache.  (Patient not taking: Reported on 09/24/2020)  . albuterol (VENTOLIN HFA) 108 (90 Base) MCG/ACT inhaler Inhale 2 puffs into the lungs every 6 (six) hours as needed for wheezing or shortness of breath. (Patient not taking: Reported on 08/22/2020)   No facility-administered encounter medications on file as of 10/04/2020.     Objective:   Patient Care Plan: Pharmacy Disease Management    Problem Identified: Disease Progression (Diabetes, Hyperlipidemia, Tobacco Use)     Long-Range Goal: Disease Progression Prevented or Minimized  Start Date: 10/04/2020  Expected End Date: 01/04/2021  This Visit's Progress: On track  Priority: High  Note:   Objective:  .  Lab Results  Component Value Date   HGBA1C 7.0 (H) 06/07/2020 .   Lab Results  Component Value Date   CREATININE 1.06 09/24/2020   CREATININE 1.14 08/08/2020   CREATININE 1.13 06/07/2020   . Current regimen:  . HLD: rosuvastatin 5 mg daily, Vascepa 2 mg BID - reports tolerability of this regimen.  . Baseline lipid panel: LDL: 125, TG: 435,  TC: 253, HDL: 20; ASCVD risk enhancing conditions: age >39, DM, HTN, current tobacco use . HTN: amlodipine 10 mg QAM, clonidine 0.1 mg BID, chlorthalidone 25 mg QAM, labetolol 200 mg BID, telmisartan 80 mg QPM . CAD Antiplatelet Regimen: ASA 81 mg QPM, prasugrel 10 mg QAM; reports that he was given a script for clonidine at procedure discharge; took 1 dose, but had significant diarrhea. Called Vascular, he reports they did not realize he was also on prasugrel. He d/c clopidogrel, continued prasugrel + aspirin . Diabetes: metformin 1000 mg BID - reports he needs an updated script, as current script says 500 mg BID: fastings ~150s, 2 hour post prandial ~ 170s . Tobacco abuse: reports use of chewing tobacco. Pre contemplative stage. Not interested in pharmacotherapy at this time. Hx intolerance to varenicline.   Current Barriers:  . Unable to independently manage blood cholesterol . Self-manages medications by utilizing BID pill box  Pharmacist Clinical Goal(s):  Marland Kitchen Over the next 90 days, patient will work with PharmD and provider towards optimized medication management . Over the next 60 days, patient will work with PharmD and provider towards optimized cholesterol values  . Over the next 90 days, patient will work with PharmD and provider towards optimized glycemic control  Interventions: . Comprehensive medication review performed; medication list updated in electronic medical record . Inter-disciplinary care team collaboration (see longitudinal plan of care) . Assessed for presence of side effects to statin therapy  . Reviewed long term cardiovascular risk reduction benefits of statin therapy. Congratulated on continued medication adherence to rosuvastatin and Vascepa. Discussed upcoming labs; recheck cholesterol in ~ 6 weeks. Likely increase rosuvastatin to high intensity statin to target greater LDL and TG lowering.  . Discussed need to increase metformin script w/ PCP. Sent to PCP for cosign.    . Reviewed importance of tobacco cessation. Encouraged patient to continue to consider tobacco cessation.   Patient Goals/Self Care Activities:  . Patient will take medications as prescribed . Patient will contemplate tobacco cessation and will consider calling [place] Quit Line  Follow Up Plan: Telephone follow up appointment with care management team member scheduled for: ~ 8 weeks     Catie Feliz Beam, PharmD, [place], CPP Clinical Pharmacist [facility] [employer] [phone]

## 2020-10-04 NOTE — Patient Instructions (Signed)
Visit Information Patient Care Plan: Pharmacy Disease Management    Problem Identified: Disease Progression (Diabetes, Hyperlipidemia, Tobacco Use)     Long-Range Goal: Disease Progression Prevented or Minimized   Start Date: 10/04/2020  Expected End Date: 01/04/2021  This Visit's Progress: On track  Priority: High  Note:   Objective:    Lab Results  Component Value Date   HGBA1C 7.0 (H) 06/07/2020    Lab Results  Component Value Date   CREATININE 1.06 09/24/2020   CREATININE 1.14 08/08/2020   CREATININE 1.13 06/07/2020    Current regimen:   HLD: rosuvastatin 5 mg daily, Vascepa 2 mg BID - reports tolerability of this regimen.   Baseline lipid panel: LDL: 125, TG: 435, TC: 253, HDL: 20; ASCVD risk enhancing conditions: age >81, DM, HTN, current tobacco use  HTN: amlodipine 10 mg QAM, clonidine 0.1 mg BID, chlorthalidone 25 mg QAM, labetolol 200 mg BID, telmisartan 80 mg QPM  CAD Antiplatelet Regimen: ASA 81 mg QPM, prasugrel 10 mg QAM; reports that he was given a script for clonidine at procedure discharge; took 1 dose, but had significant diarrhea. Called Vascular, he reports they did not realize he was also on prasugrel. He d/c clopidogrel, continued prasugrel + aspirin  Diabetes: metformin 1000 mg BID - reports he needs an updated script, as current script says 500 mg BID: fastings ~150s, 2 hour post prandial ~ 170s  Tobacco abuse: reports use of chewing tobacco. Pre contemplative stage. Not interested in pharmacotherapy at this time. Hx intolerance to varenicline.   Current Barriers:   Unable to independently manage blood cholesterol  Self-manages medications by utilizing BID pill box  Pharmacist Clinical Goal(s):   Over the next 90 days, patient will work with PharmD and provider towards optimized medication management  Over the next 60 days, patient will work with PharmD and provider towards optimized cholesterol values   Over the next 90 days, patient will work  with PharmD and provider towards optimized glycemic control  Interventions:  Comprehensive medication review performed; medication list updated in electronic medical record  Inter-disciplinary care team collaboration (see longitudinal plan of care)  Assessed for presence of side effects to statin therapy   Reviewed long term cardiovascular risk reduction benefits of statin therapy. Congratulated on continued medication adherence to rosuvastatin and Vascepa. Discussed upcoming labs; recheck cholesterol in ~ 6 weeks. Likely increase rosuvastatin to high intensity statin to target greater LDL and TG lowering.   Discussed need to increase metformin script w/ PCP. Sent to PCP for cosign.   Reviewed importance of tobacco cessation. Encouraged patient to continue to consider tobacco cessation.   Patient Goals/Self Care Activities:   Patient will take medications as prescribed  Patient will contemplate tobacco cessation and will consider calling [place] Quit Line  Follow Up Plan: Telephone follow up appointment with care management team member scheduled for: ~ 8 weeks     Goals Addressed              This Visit's Progress     Patient Stated     Chronic Disease Management (pt-stated)        Patient Goals/Self Care Activities:   Patient will take medications as prescribed  Patient will contemplate tobacco cessation and will consider calling [place] Quit Line      COMPLETED: Increase physical activity (pt-stated)        Walk as tolerated for exercise       The patient verbalized understanding of instructions provided today and declined a  print copy of patient instruction materials.   Catie Feliz Beam, PharmD, [place], CPP Clinical Pharmacist [facility] [place] [employer] [phone]

## 2020-10-06 ENCOUNTER — Other Ambulatory Visit: Payer: Self-pay | Admitting: Nurse Practitioner

## 2020-10-06 NOTE — Progress Notes (Signed)
Updated his Metformin to 500 mg take 2 tablets by mouth two times daily with a meal.

## 2020-10-07 ENCOUNTER — Telehealth: Payer: Self-pay | Admitting: Pharmacist

## 2020-10-07 NOTE — Progress Notes (Signed)
Patient calls today to report ~2 week history of nasal congestion, productive cough. Notes he took an OTC self COVID test over the weekend x2 and was negative. Notes that he thinks he needs an anitbiotic to prevent worsening to bronchitis. Discussed that he would need an appointment with a provider here to receive treatment, or he could go to urgent care. Patient reports he will go to urgent care today. Routing to PCP for FYI.

## 2020-10-08 ENCOUNTER — Ambulatory Visit: Payer: Medicare PPO | Admitting: Nurse Practitioner

## 2020-10-08 DIAGNOSIS — J0140 Acute pansinusitis, unspecified: Secondary | ICD-10-CM | POA: Diagnosis not present

## 2020-10-10 ENCOUNTER — Other Ambulatory Visit: Payer: Self-pay

## 2020-10-14 ENCOUNTER — Telehealth (INDEPENDENT_AMBULATORY_CARE_PROVIDER_SITE_OTHER): Payer: Medicare PPO | Admitting: Nurse Practitioner

## 2020-10-14 ENCOUNTER — Encounter: Payer: Self-pay | Admitting: Nurse Practitioner

## 2020-10-14 ENCOUNTER — Other Ambulatory Visit: Payer: Self-pay

## 2020-10-14 VITALS — BP 130/66 | HR 68 | Ht 66.0 in | Wt 224.0 lb

## 2020-10-14 DIAGNOSIS — I2510 Atherosclerotic heart disease of native coronary artery without angina pectoris: Secondary | ICD-10-CM | POA: Diagnosis not present

## 2020-10-14 DIAGNOSIS — E1165 Type 2 diabetes mellitus with hyperglycemia: Secondary | ICD-10-CM | POA: Diagnosis not present

## 2020-10-14 DIAGNOSIS — I10 Essential (primary) hypertension: Secondary | ICD-10-CM

## 2020-10-14 DIAGNOSIS — E1151 Type 2 diabetes mellitus with diabetic peripheral angiopathy without gangrene: Secondary | ICD-10-CM | POA: Diagnosis not present

## 2020-10-14 DIAGNOSIS — E782 Mixed hyperlipidemia: Secondary | ICD-10-CM

## 2020-10-14 DIAGNOSIS — R7989 Other specified abnormal findings of blood chemistry: Secondary | ICD-10-CM | POA: Diagnosis not present

## 2020-10-14 DIAGNOSIS — F17200 Nicotine dependence, unspecified, uncomplicated: Secondary | ICD-10-CM | POA: Diagnosis not present

## 2020-10-14 DIAGNOSIS — IMO0002 Reserved for concepts with insufficient information to code with codable children: Secondary | ICD-10-CM

## 2020-10-14 NOTE — Progress Notes (Signed)
Virtual Visit via telephone  Note  This visit type was conducted due to national recommendations for restrictions regarding the COVID-19 pandemic (e.g. social distancing).  This format is felt to be most appropriate for this patient at this time.  All issues noted in this document were discussed and addressed.  No physical exam was performed (except for noted visual exam findings with Video Visits).   I connected with@ on 10/16/20 at  1:00 PM EST by a video enabled telemedicine application or telephone and verified that I am speaking with the correct person using two identifiers. Location patient: home Location provider: work or home office Persons participating in the virtual visit: patient, provider  I discussed the limitations, risks, security and privacy concerns of performing an evaluation and management service by telephone and the availability of in person appointments. I also discussed with the patient that there may be a patient responsible charge related to this service. The patient expressed understanding and agreed to proceed.  Interactive audio and video telecommunications were attempted between this provider and patient, however failed, due to patient  did not have access to video capability.  We continued and completed visit with audio only.    Call with lab results. His He appears to have subclinical hypothyroid. Follow up in 3 months and if TSH is rising- will treat.   His lipids are not at goal with ASCVD and PAD- will discuss statin alternatives with his Cardiologist.   His Uric Acid level is elevated with gout changes- tophi to his great toes- considering the risks/benefits of allopurinol and will speak to his Podiatrist.   His DM could be better controlled. Continue to take the Metformin 1000 mg twice daily and follow the diabetic diet.  His B12, Hep C, electrolytes, kidney, liver functions are good. He has a little protein spill in his urine from DM and he is working  on better control.   Reason for visit: Routine follow up- recently treated for URI and according to policy could not come in for in-person visit.   HPI: Mr. Birch is a 69 year old with history of Covid infection, CAD with stents on Effient and ASA 81 mg , HLD, GERD, T2DM, OA, gout, DJD of the lumbar and sacral spine, PAD who presents for a 3 month follow up.    Since his last visit, he had a left lower extremity angiogram with PTCA and stent placement of superficial femoral and popliteal arteries.  There is PTCA of the left peroneal.  Patient had good results and the pain in his left leg is 90% gone.  He is able to ambulate now and he just cannot believe how good the leg feels. He has slight pain in right leg and also has vascular disease present  there- but not as severe as the left leg.  He has not needed his diclofenac.  He has cut his chewing tobacco down by half, and thinks about quitting every day.  He did have a sinus infection was treated with Augmentin and Flonase and that knocked it out.  He has no URI symptoms at this time.  He also established care with chronic care management, and has seen Catie.  T2DM: Patient is on Metformin 1000 mg twice daily.  A1c was 7.0. He reports fasting blood sugars are 130-140 in the a.m., and 2-hour postprandial 160-165-158.  These numbers are the best they have been in a while.  He is due for repeat A1c and labs.  His micro albumin was positive  at 3.7, micro albumin creatinine ratio was normal at 2.8.  He had a diabetic foot exam in July that showed normal monofilament and pulses were palpable per Podiatry. He was also seen for ingrown toenail, lesion on the left foot, and chronic gouty tophi of the great toes which Podiatry did not recommend treatment as he reports has not been bothering him and stable for years. His uric acid  level 12.3. His feet were deemed to be in pretty good shape as far as his diabetes was concerned. Needs diabetic eye exam.    Hypothyroid: TSH 4.99 to 6.01 with Free T4 0.73 and asymptomatic. Appears to be subclinical, but needs repeat TSH and if elevated will start Levothroid.  CAD/HTN: He has maintained on amlodipine 10 mg daily, chlorthalidone 25 mg daily, clonidine 0.1 mg twice daily, labetalol 200 mg, Effient 10 mg daily, enteric-coated aspirin 81 mg daily.  BP Readings from Last 3 Encounters:  10/14/20 130/66  09/24/20 (!) 141/64  08/22/20 126/68    HLD: Not at goal of LDL<70. Vascepa 2 gm twice daily to lower triglycerides and  rosuvastatin 5 mg daily. He was reluctant to begin a statin so we started at a low dose.  He has done very well, and has noted no muscle aches.  He is willing to increase the dose.  Lab Results  Component Value Date   CHOL 253 (H) 06/07/2020   HDL 29.00 (L) 06/07/2020   LDLDIRECT 125.0 06/07/2020   TRIG (H) 06/07/2020    435.0 Triglyceride is over 400; calculations on Lipids are invalid.   CHOLHDL 9 06/07/2020   ROS: See pertinent positives and negatives per HPI.  Past Medical History:  Diagnosis Date  . Acquired flat foot 11/15/2015  . Arthritis of knee, degenerative 04/15/2015  . BPH (benign prostatic hyperplasia)   . Chewing tobacco nicotine dependence without complication 4/49/2010  . Cholecystitis 06/28/2017  . Diabetes mellitus without complication ([place])   . Diabetes mellitus, type 2 ([place]) 12/13/2002   Overview:  DIET CONTROLLED   . Disease of nail 07/16/2011  . Encounter for screening for malignant neoplasm of prostate 10/29/2014  . Essential (primary) hypertension 10/17/2010  . Familial aortic aneurysm 03/26/2014  . Family history of cardiovascular disease 03/26/2014  . Gastro-esophageal reflux disease without esophagitis 08/11/2013  . Generalized OA 08/11/2013  . Gout 03/26/2014  . Heart murmur   . HOH (hard of hearing)    aids  . Motion sickness    "sea sick"  . Primary osteoarthritis of right knee 04/14/2015  . Pure hypercholesterolemia 12/13/2002  . Skin  lesion 03/11/2015    Past Surgical History:  Procedure Laterality Date  . BACK SURGERY  [phone]  . CARDIAC CATHETERIZATION  70's   pericarditis  . CATARACT EXTRACTION W/PHACO Right 03/23/2016   Procedure: CATARACT EXTRACTION PHACO AND INTRAOCULAR LENS PLACEMENT (IOC);  Surgeon: Estill Cotta, MD;  Location: [facility];  Service: Ophthalmology;  Laterality: Right;  [place] 01:31   . CATARACT EXTRACTION W/PHACO Left 10/23/2019   Procedure: CATARACT EXTRACTION PHACO AND INTRAOCULAR LENS PLACEMENT (IOC) LEFT DIABETIC TORIC LENS 5.82,   00:44.1;  Surgeon: Eulogio Bear, MD;  Location: [facility];  Service: Ophthalmology;  Laterality: Left;  Diabetic - oral meds  . CHOLECYSTECTOMY N/A 06/29/2017   Procedure: LAPAROSCOPIC CHOLECYSTECTOMY;  Surgeon: Olean Ree, MD;  Location: [facility];  Service: General;  Laterality: N/A;  . COLONOSCOPY WITH PROPOFOL N/A 10/06/2017   Procedure: COLONOSCOPY WITH PROPOFOL;  Surgeon: Manya Silvas, MD;  Location:  [facility];  Service: Endoscopy;  Laterality: N/A;  . CORONARY STENT INTERVENTION N/A 04/11/2020   Procedure: CORONARY STENT INTERVENTION;  Surgeon: Yolonda Kida, MD;  Location: [facility];  Service: Cardiovascular;  Laterality: N/A;  . HERNIA REPAIR  11,   umbilical x2  . KNEE ARTHROSCOPY Right 12/20/2017   Procedure: ARTHROSCOPY KNEE EXTENSIVE SYNOVECTOMY AND LYSIS OF ADHESIONS;  Surgeon: Dereck Leep, MD;  Location: [facility];  Service: Orthopedics;  Laterality: Right;  . KNEE DEBRIDEMENT Left    tendon  . LEFT HEART CATH AND CORONARY ANGIOGRAPHY Left 04/11/2020   Procedure: LEFT HEART CATH AND CORONARY ANGIOGRAPHY;  Surgeon: Corey Skains, MD;  Location: [facility];  Service: Cardiovascular;  Laterality: Left;  . LOWER EXTREMITY ANGIOGRAPHY Left 09/24/2020   Procedure: LOWER EXTREMITY ANGIOGRAPHY;  Surgeon: Katha Cabal, MD;  Location: [facility];  Service: Cardiovascular;  Laterality: Left;   . TONSILLECTOMY  72  . TOTAL KNEE ARTHROPLASTY Right 04/15/2015   Procedure: TOTAL KNEE ARTHROPLASTY;  Surgeon: Frederik Pear, MD;  Location: [facility];  Service: Orthopedics;  Laterality: Right;  . TRANSURETHRAL RESECTION OF PROSTATE  ?    Family History  Problem Relation Age of Onset  . Multiple myeloma Mother   . Aneurysm Father   . Aneurysm Brother   . Nephrolithiasis Neg Hx   . Bladder Cancer Neg Hx   . Prostate cancer Neg Hx   . Kidney cancer Neg Hx     SOCIAL HX: Tobacco use disorder: Quit smoking 28 years ago. He has a 20 pack- year smoking history. He uses chewing tobacco. No alcohol or illicit drug use.   Current Outpatient Medications:  .  acetaminophen (TYLENOL) 500 MG tablet, Take 1,000 mg by mouth every 6 (six) hours as needed for mild pain or headache. , Disp: , Rfl:  .  albuterol (VENTOLIN HFA) 108 (90 Base) MCG/ACT inhaler, Inhale 2 puffs into the lungs every 6 (six) hours as needed for wheezing or shortness of breath., Disp: 18 g, Rfl: 0 .  amLODipine (NORVASC) 10 MG tablet, Take 1 tablet (10 mg total) by mouth daily., Disp: 90 tablet, Rfl: 1 .  aspirin EC 81 MG tablet, Take 81 mg by mouth at bedtime., Disp: , Rfl:  .  chlorthalidone (HYGROTON) 25 MG tablet, Take 25 mg by mouth daily., Disp: , Rfl:  .  cloNIDine (CATAPRES) 0.1 MG tablet, TAKE 1 TABLET TWICE DAILY (Patient taking differently: Take 0.1 mg by mouth 2 (two) times daily. ), Disp: 180 tablet, Rfl: 1 .  diclofenac (VOLTAREN) 75 MG EC tablet, Take 1 tablet (75 mg total) by mouth 2 (two) times daily as needed. (Patient taking differently: Take 75 mg by mouth daily as needed for moderate pain. ), Disp: 60 tablet, Rfl: 1 .  glucose blood test strip, Use as instructed, Disp: 100 each, Rfl: 12 .  icosapent Ethyl (VASCEPA) 1 g capsule, Take 2 capsules (2 g total) by mouth 2 (two) times daily., Disp: 360 capsule, Rfl: 3 .  labetalol (NORMODYNE) 200 MG tablet, TAKE 1 TABLET TWICE DAILY (Patient taking differently: Take 200  mg by mouth 2 (two) times daily. ), Disp: 180 tablet, Rfl: 0 .  metFORMIN (GLUCOPHAGE) 500 MG tablet, Take 2 tablets (1,000 mg total) by mouth 2 (two) times daily with a meal., Disp: 360 tablet, Rfl: 3 .  omeprazole (PRILOSEC) 20 MG capsule, Take 1 capsule (20 mg total) by mouth daily., Disp: 90 capsule, Rfl: 1 .  prasugrel (EFFIENT) 10 MG TABS tablet, Take 10 mg by mouth daily. , Disp: , Rfl:  .  rosuvastatin (CRESTOR) 5 MG tablet, Take 1 tablet (5 mg total) by mouth daily., Disp: 90 tablet, Rfl: 3 .  telmisartan (MICARDIS) 80 MG tablet, Take 80 mg by mouth daily with supper. , Disp: , Rfl:  .  vitamin B-12 (CYANOCOBALAMIN) 1000 MCG tablet, Take 1,000 mcg by mouth daily with supper. , Disp: , Rfl:   EXAM:  VITALS per patient if applicable: BP 935/52 pulse 68 weight 224  GENERAL: Sounds alert, oriented  and in no acute distress  LUNGS: No sound of respiratory distress, breathing rate appears normal  PSYCH/NEURO: pleasant and cooperative, no obvious depression or anxiety, speech and thought processing grossly intact  ASSESSMENT AND PLAN:  Discussed the following assessment and plan:  DM (diabetes mellitus) type II uncontrolled, periph vascular disorder (HCC) - Plan: CBC with Differential/Platelet, Hemoglobin A1c, Microalbumin / creatinine urine ratio  Elevated TSH - Plan: TSH  Tobacco use disorder  Coronary artery disease involving native coronary artery of native heart without angina pectoris  Mixed hyperlipidemia - Plan: Lipid panel  Benign essential HTN - Plan: Comprehensive metabolic panel  No problem-specific Assessment & Plan notes found for this encounter.  Pt advised:  Please come in to get routine lab work the week of 10/21/2020 as long as your sinus infection is gone.   Increase the rosuvastatin to 10 mg daily.  Diabetic eye exam every year and please give [place] the report.   Shingles vaccine planned for December.  Please follow up office visit in 2 months for in  person exam and follow up lipid/liver/diabetes labs.  Discussed the importance of tobacco cessation, benefits of quitting, implications of continued use. He is motivated to quit and has cut his chewing tobacco down by half.   Smoking cessation information provided with [place] quit line, [phone] and call for more information. They may be able to get you started on the nicotine  patch and gum-free samples with good directions on how to use these properly. This can be reinforced by CCM, as well.   I discussed the assessment and treatment plan with the patient. The patient was provided an opportunity to ask questions and all were answered. The patient agreed with the plan and demonstrated an understanding of the instructions.   The patient was advised to call back or seek an in-person evaluation if the symptoms worsen or if the condition fails to improve as anticipated.  Denice Paradise, NP Adult Nurse Practitioner [place] [phone]

## 2020-10-16 ENCOUNTER — Encounter: Payer: Self-pay | Admitting: Nurse Practitioner

## 2020-10-16 DIAGNOSIS — E1151 Type 2 diabetes mellitus with diabetic peripheral angiopathy without gangrene: Secondary | ICD-10-CM | POA: Insufficient documentation

## 2020-10-16 DIAGNOSIS — E1165 Type 2 diabetes mellitus with hyperglycemia: Secondary | ICD-10-CM | POA: Insufficient documentation

## 2020-10-16 DIAGNOSIS — E119 Type 2 diabetes mellitus without complications: Secondary | ICD-10-CM | POA: Insufficient documentation

## 2020-10-16 DIAGNOSIS — E782 Mixed hyperlipidemia: Secondary | ICD-10-CM | POA: Insufficient documentation

## 2020-10-16 DIAGNOSIS — F17200 Nicotine dependence, unspecified, uncomplicated: Secondary | ICD-10-CM | POA: Insufficient documentation

## 2020-10-16 NOTE — Patient Instructions (Addendum)
Please come in to get routine lab work the week of 10/21/2020 as long as your sinus infection is gone.   Increase the rosuvastatin to 10 mg daily.   Metformin 1000 mg twice daily with food. Continue your other medications unchanged.   Diabetic eye exam every year and please give [place] the report.   Shingles vaccine planned for December.  Please follow up office visit in 2 months for in person exam and follow up lipid/liver/diabetes labs.  Discussed the importance of tobacco cessation, benefits of quitting, implications of continued use. Provided patient with information on 1-800-QUIT-NOW.   Smoking cessation information provided with  quit line, 1 260-526-4753 and call for more information. They may be able to get you started on the nicotine  patch and gum-free samples with good directions on how to use these properly.    Tobacco Use Disorder Tobacco use disorder (TUD) occurs when a person craves, seeks, and uses tobacco, regardless of the consequences. This disorder can cause problems with mental and physical health. It can affect your ability to have healthy relationships, and it can keep you from meeting your responsibilities at work, home, or school. Tobacco may be:  Smoked as a cigarette or cigar.  Inhaled using e-cigarettes.  Smoked in a pipe or hookah.  Chewed as smokeless tobacco.  Inhaled into the nostrils as snuff. Tobacco products contain a dangerous chemical called nicotine, which is very addictive. Nicotine triggers hormones that make the body feel stimulated and works on areas of the brain that make you feel good. These effects can make it hard for people to quit nicotine. Tobacco contains many other unsafe chemicals that can damage almost every organ in the body. Smoking tobacco also puts others in danger due to fire risk and possible health problems caused by breathing in secondhand smoke. What are the signs or symptoms? Symptoms of TUD may include:  Being unable to  slow down or stop your tobacco use.  Spending an abnormal amount of time getting or using tobacco.  Craving tobacco. Cravings may last for up to 6 months after quitting.  Tobacco use that: ? Interferes with your work, school, or home life. ? Interferes with your personal and social relationships. ? Makes you give up activities that you once enjoyed or found important.  Using tobacco even though you know that it is: ? Dangerous or bad for your health or someone else's health. ? Causing problems in your life.  Needing more and more of the substance to get the same effect (developing tolerance).  Experiencing unpleasant symptoms if you do not use the substance (withdrawal). Withdrawal symptoms may include: ? Depressed, anxious, or irritable mood. ? Difficulty concentrating. ? Increased appetite. ? Restlessness or trouble sleeping.  Using the substance to avoid withdrawal. How is this diagnosed? This condition may be diagnosed based on:  Your current and past tobacco use. Your health care provider may ask questions about how your tobacco use affects your life.  A physical exam. You may be diagnosed with TUD if you have at least two symptoms within a 3-month period. How is this treated? This condition is treated by stopping tobacco use. Many people are unable to quit on their own and need help. Treatment may include:  Nicotine replacement therapy (NRT). NRT provides nicotine without the other harmful chemicals in tobacco. NRT gradually lowers the dosage of nicotine in the body and reduces withdrawal symptoms. NRT is available as: ? Over-the-counter gums, lozenges, and skin patches. ? Prescription mouth inhalers  and nasal sprays.  Medicine that acts on the brain to reduce cravings and withdrawal symptoms.  A type of talk therapy that examines your triggers for tobacco use, how to avoid them, and how to cope with cravings (behavioral therapy).  Hypnosis. This may help with  withdrawal symptoms.  Joining a support group for others coping with TUD. The best treatment for TUD is usually a combination of medicine, talk therapy, and support groups. Recovery can be a long process. Many people start using tobacco again after stopping (relapse). If you relapse, it does not mean that treatment will not work. Follow these instructions at home:  Lifestyle  Do not use any products that contain nicotine or tobacco, such as cigarettes and e-cigarettes.  Avoid things that trigger tobacco use as much as you can. Triggers include people and situations that usually cause you to use tobacco.  Avoid drinks that contain caffeine, including coffee. These may worsen some withdrawal symptoms.  Find ways to manage stress. Wanting to smoke may cause stress, and stress can make you want to smoke. Relaxation techniques such as deep breathing, meditation, and yoga may help.  Attend support groups as needed. These groups are an important part of long-term recovery for many people. General instructions  Take over-the-counter and prescription medicines only as told by your health care provider.  Check with your health care provider before taking any new prescription or over-the-counter medicines.  Decide on a friend, family member, or smoking quit-line (such as 1-800-QUIT-NOW in the [place] that you can call or text when you feel the urge to smoke or when you need help coping with cravings.  Keep all follow-up visits as told by your health care provider and therapist. This is important. Contact a health care provider if:  You are not able to take your medicines as prescribed.  Your symptoms get worse, even with treatment. Summary  Tobacco use disorder (TUD) occurs when a person craves, seeks, and uses tobacco regardless of the consequences.  This condition may be diagnosed based on your current and past tobacco use and a physical exam.  Many people are unable to quit on their own  and need help. Recovery can be a long process.  The most effective treatment for TUD is usually a combination of medicine, talk therapy, and support groups. This information is not intended to replace advice given to you by your health care provider. Make sure you discuss any questions you have with your health care provider. Document Revised: 11/03/2017 Document Reviewed: 11/03/2017 Elsevier Patient Education  2020 [employer].

## 2020-10-18 ENCOUNTER — Other Ambulatory Visit (INDEPENDENT_AMBULATORY_CARE_PROVIDER_SITE_OTHER): Payer: Self-pay | Admitting: Vascular Surgery

## 2020-10-18 DIAGNOSIS — I70222 Atherosclerosis of native arteries of extremities with rest pain, left leg: Secondary | ICD-10-CM

## 2020-10-18 DIAGNOSIS — Z95820 Peripheral vascular angioplasty status with implants and grafts: Secondary | ICD-10-CM

## 2020-10-21 ENCOUNTER — Other Ambulatory Visit: Payer: Self-pay

## 2020-10-21 ENCOUNTER — Ambulatory Visit (INDEPENDENT_AMBULATORY_CARE_PROVIDER_SITE_OTHER): Payer: Medicare PPO

## 2020-10-21 ENCOUNTER — Other Ambulatory Visit: Payer: Self-pay | Admitting: Nurse Practitioner

## 2020-10-21 ENCOUNTER — Ambulatory Visit (INDEPENDENT_AMBULATORY_CARE_PROVIDER_SITE_OTHER): Payer: Medicare PPO | Admitting: Vascular Surgery

## 2020-10-21 ENCOUNTER — Encounter (INDEPENDENT_AMBULATORY_CARE_PROVIDER_SITE_OTHER): Payer: Self-pay | Admitting: Vascular Surgery

## 2020-10-21 VITALS — BP 107/66 | HR 69 | Ht 66.0 in | Wt 228.0 lb

## 2020-10-21 DIAGNOSIS — I70213 Atherosclerosis of native arteries of extremities with intermittent claudication, bilateral legs: Secondary | ICD-10-CM | POA: Diagnosis not present

## 2020-10-21 DIAGNOSIS — E1151 Type 2 diabetes mellitus with diabetic peripheral angiopathy without gangrene: Secondary | ICD-10-CM | POA: Diagnosis not present

## 2020-10-21 DIAGNOSIS — I25118 Atherosclerotic heart disease of native coronary artery with other forms of angina pectoris: Secondary | ICD-10-CM

## 2020-10-21 DIAGNOSIS — E782 Mixed hyperlipidemia: Secondary | ICD-10-CM | POA: Diagnosis not present

## 2020-10-21 DIAGNOSIS — I70222 Atherosclerosis of native arteries of extremities with rest pain, left leg: Secondary | ICD-10-CM

## 2020-10-21 DIAGNOSIS — Z95820 Peripheral vascular angioplasty status with implants and grafts: Secondary | ICD-10-CM

## 2020-10-21 DIAGNOSIS — E1165 Type 2 diabetes mellitus with hyperglycemia: Secondary | ICD-10-CM

## 2020-10-21 DIAGNOSIS — I10 Essential (primary) hypertension: Secondary | ICD-10-CM

## 2020-10-21 DIAGNOSIS — IMO0002 Reserved for concepts with insufficient information to code with codable children: Secondary | ICD-10-CM

## 2020-10-21 NOTE — Progress Notes (Signed)
MRN : [id number]  Juan Watson is a 69 y.o. ([birth year]) male who presents with chief complaint of No chief complaint on file. Marland Kitchen  History of Present Illness:   The patient returns to the office for followup and review status post angiogram with intervention on September 24, 2020.  Intervention performed: Percutaneous transluminal angioplasty and stent placement to the left superficial femoral and popliteal arteries with percutaneous transluminal angioplasty to 3 mm of the left peroneal.  The patient has single-vessel runoff via the peroneal.  The patient notes improvement in the lower extremity symptoms. No interval shortening of the patient's claudication distance or rest pain symptoms. Previous wounds have now healed.  No new ulcers or wounds have occurred since the last visit.  There have been no significant changes to the patient's overall health care.  The patient denies amaurosis fugax or recent TIA symptoms. There are no recent neurological changes noted. The patient denies history of DVT, PE or superficial thrombophlebitis. The patient denies recent episodes of angina or shortness of breath.   ABI's Rt=1.02 and Lt=1.14    No outpatient medications have been marked as taking for the 10/21/20 encounter (Appointment) with Delana Meyer, Dolores Lory, MD.    Past Medical History:  Diagnosis Date  . Acquired flat foot 11/15/2015  . Arthritis of knee, degenerative 04/15/2015  . BPH (benign prostatic hyperplasia)   . Chewing tobacco nicotine dependence without complication 2/83/1517  . Cholecystitis 06/28/2017  . Diabetes mellitus without complication ([place])   . Diabetes mellitus, type 2 ([place]) 12/13/2002   Overview:  DIET CONTROLLED   . Disease of nail 07/16/2011  . Encounter for screening for malignant neoplasm of prostate 10/29/2014  . Essential (primary) hypertension 10/17/2010  . Familial aortic aneurysm 03/26/2014  . Family history of cardiovascular disease 03/26/2014  .  Gastro-esophageal reflux disease without esophagitis 08/11/2013  . Generalized OA 08/11/2013  . Gout 03/26/2014  . Heart murmur   . HOH (hard of hearing)    aids  . Motion sickness    "sea sick"  . Primary osteoarthritis of right knee 04/14/2015  . Pure hypercholesterolemia 12/13/2002  . Skin lesion 03/11/2015    Past Surgical History:  Procedure Laterality Date  . BACK SURGERY  [phone]  . CARDIAC CATHETERIZATION  70's   pericarditis  . CATARACT EXTRACTION W/PHACO Right 03/23/2016   Procedure: CATARACT EXTRACTION PHACO AND INTRAOCULAR LENS PLACEMENT (IOC);  Surgeon: Estill Cotta, MD;  Location: [facility] ORS;  Service: Ophthalmology;  Laterality: Right;  [place] 01:31   . CATARACT EXTRACTION W/PHACO Left 10/23/2019   Procedure: CATARACT EXTRACTION PHACO AND INTRAOCULAR LENS PLACEMENT (IOC) LEFT DIABETIC TORIC LENS 5.82,   00:44.1;  Surgeon: Eulogio Bear, MD;  Location: [place];  Service: Ophthalmology;  Laterality: Left;  Diabetic - oral meds  . CHOLECYSTECTOMY N/A 06/29/2017   Procedure: LAPAROSCOPIC CHOLECYSTECTOMY;  Surgeon: Olean Ree, MD;  Location: [facility] ORS;  Service: General;  Laterality: N/A;  . COLONOSCOPY WITH PROPOFOL N/A 10/06/2017   Procedure: COLONOSCOPY WITH PROPOFOL;  Surgeon: Manya Silvas, MD;  Location: [facility] ENDOSCOPY;  Service: Endoscopy;  Laterality: N/A;  . CORONARY STENT INTERVENTION N/A 04/11/2020   Procedure: CORONARY STENT INTERVENTION;  Surgeon: Yolonda Kida, MD;  Location: [place] CV LAB;  Service: Cardiovascular;  Laterality: N/A;  . HERNIA REPAIR  11,   umbilical x2  . KNEE ARTHROSCOPY Right 12/20/2017   Procedure: ARTHROSCOPY KNEE EXTENSIVE SYNOVECTOMY AND LYSIS OF ADHESIONS;  Surgeon: Dereck Leep, MD;  Location: [facility]  ORS;  Service: Orthopedics;  Laterality: Right;  . KNEE DEBRIDEMENT Left    tendon  . LEFT HEART CATH AND CORONARY ANGIOGRAPHY Left 04/11/2020   Procedure: LEFT HEART CATH AND CORONARY ANGIOGRAPHY;  Surgeon:  Corey Skains, MD;  Location: [place] CV LAB;  Service: Cardiovascular;  Laterality: Left;  . LOWER EXTREMITY ANGIOGRAPHY Left 09/24/2020   Procedure: LOWER EXTREMITY ANGIOGRAPHY;  Surgeon: Katha Cabal, MD;  Location: [place] CV LAB;  Service: Cardiovascular;  Laterality: Left;  . TONSILLECTOMY  72  . TOTAL KNEE ARTHROPLASTY Right 04/15/2015   Procedure: TOTAL KNEE ARTHROPLASTY;  Surgeon: Frederik Pear, MD;  Location: [place];  Service: Orthopedics;  Laterality: Right;  . TRANSURETHRAL RESECTION OF PROSTATE  ?    Social History Social History   Tobacco Use  . Smoking status: Former Smoker    Packs/day: 2.00    Years: 25.00    Pack years: 50.00    Types: Cigarettes    Quit date: 07/15/1992    Years since quitting: 28.2  . Smokeless tobacco: Current User    Types: Chew  Vaping Use  . Vaping Use: Never used  Substance Use Topics  . Alcohol use: No  . Drug use: No    Family History Family History  Problem Relation Age of Onset  . Multiple myeloma Mother   . Aneurysm Father   . Aneurysm Brother   . Nephrolithiasis Neg Hx   . Bladder Cancer Neg Hx   . Prostate cancer Neg Hx   . Kidney cancer Neg Hx     Allergies  Allergen Reactions  . Bee Venom Shortness Of Breath and Swelling  . Statins Other (See Comments)    Other reaction(s): SEVERE MYALGIAS  . Sulfa Antibiotics Swelling    Tongue swelling  . Ben Gay Ultra [Menthol] Other (See Comments)    Whelts and redness of skin     REVIEW OF SYSTEMS (Negative unless checked)  Constitutional: [] Weight loss  [] Fever  [] Chills Cardiac: [] Chest pain   [] Chest pressure   [] Palpitations   [] Shortness of breath when laying flat   [] Shortness of breath with exertion. Vascular:  [] Pain in legs with walking   [] Pain in legs at rest  [] History of DVT   [] Phlebitis   [] Swelling in legs   [] Varicose veins   [] Non-healing ulcers Pulmonary:   [] Uses home oxygen   [] Productive cough   [] Hemoptysis   [] Wheeze  [] COPD    [] Asthma Neurologic:  [] Dizziness   [] Seizures   [] History of stroke   [] History of TIA  [] Aphasia   [] Vissual changes   [] Weakness or numbness in arm   [] Weakness or numbness in leg Musculoskeletal:   [] Joint swelling   [] Joint pain   [] Low back pain Hematologic:  [] Easy bruising  [] Easy bleeding   [] Hypercoagulable state   [] Anemic Gastrointestinal:  [] Diarrhea   [] Vomiting  [] Gastroesophageal reflux/heartburn   [] Difficulty swallowing. Genitourinary:  [] Chronic kidney disease   [] Difficult urination  [] Frequent urination   [] Blood in urine Skin:  [] Rashes   [] Ulcers  Psychological:  [] History of anxiety   []  History of major depression.  Physical Examination  There were no vitals filed for this visit. There is no height or weight on file to calculate BMI. Gen: WD/WN, NAD Head: [place]/AT, No temporalis wasting.  Ear/Nose/Throat: Hearing grossly intact, nares w/o erythema or drainage Eyes: PER, EOMI, sclera nonicteric.  Neck: Supple, no large masses.   Pulmonary:  Good air movement, no audible wheezing bilaterally, no use  of accessory muscles.  Cardiac: RRR, no JVD Vascular:  Vessel Right Left  Radial Palpable Palpable  PT Not Palpable Not Palpable  DP Trace Palpable Not Palpable  Gastrointestinal: Non-distended. No guarding/no peritoneal signs.  Musculoskeletal: M/S 5/5 throughout.  No deformity or atrophy.  Neurologic: CN 2-12 intact. Symmetrical.  Speech is fluent. Motor exam as listed above. Psychiatric: Judgment intact, Mood & affect appropriate for pt's clinical situation. Dermatologic: No rashes or ulcers noted.  No changes consistent with cellulitis.  CBC Lab Results  Component Value Date   WBC 5.2 06/07/2020   HGB 13.3 06/07/2020   HCT 39.0 06/07/2020   MCV 83.8 06/07/2020   PLT 227.0 06/07/2020    BMET    Component Value Date/Time   NA 139 08/08/2020 0855   K 4.1 08/08/2020 0855   CL 99 08/08/2020 0855   CO2 27 08/08/2020 0855   GLUCOSE 143 (H) 08/08/2020 0855    BUN 22 09/24/2020 0733   CREATININE 1.06 09/24/2020 0733   CALCIUM 9.8 08/08/2020 0855   GFRNONAA >60 09/24/2020 0733   GFRAA >60 02/13/2020 1809   CrCl cannot be calculated (Patient's most recent lab result is older than the maximum 21 days allowed.).  COAG Lab Results  Component Value Date   INR 0.96 04/05/2015    Radiology PERIPHERAL VASCULAR CATHETERIZATION  Result Date: 09/24/2020 See Op Note    Assessment/Plan 1. Atherosclerosis of native artery of both lower extremities with intermittent claudication (HCC) Recommend:  The patient is status post successful angiogram with intervention.  The patient reports that the claudication symptoms and leg pain is essentially gone.   The patient denies lifestyle limiting changes at this point in time.  No further invasive studies, angiography or surgery at this time The patient should continue walking and begin a more formal exercise program.  The patient should continue antiplatelet therapy and aggressive treatment of the lipid abnormalities  Smoking cessation was again discussed  The patient should continue wearing graduated compression socks 10-15 mmHg strength to control the mild edema.  Patient should undergo noninvasive studies as ordered. The patient will follow up with me after the studies.   - VAS [place] ABI WITH/WO TBI; Future - VAS [place] LOWER EXTREMITY ARTERIAL DUPLEX; Future  2. Coronary artery disease of native artery of native heart with stable angina pectoris (HCC) Continue cardiac and antihypertensive medications as already ordered and reviewed, no changes at this time.  Continue statin as ordered and reviewed, no changes at this time  Nitrates PRN for chest pain   3. Benign essential HTN Continue antihypertensive medications as already ordered, these medications have been reviewed and there are no changes at this time.   4. Mixed hyperlipidemia Continue statin as ordered and reviewed, no changes at this  time   5. DM (diabetes mellitus) type II uncontrolled, periph vascular disorder (HCC) Continue hypoglycemic medications as already ordered, these medications have been reviewed and there are no changes at this time.  Hgb A1C to be monitored as already arranged by primary service    Hortencia Pilar, MD  10/21/2020 10:34 AM

## 2020-10-29 ENCOUNTER — Telehealth: Payer: Self-pay | Admitting: Nurse Practitioner

## 2020-10-29 DIAGNOSIS — E785 Hyperlipidemia, unspecified: Secondary | ICD-10-CM

## 2020-10-29 DIAGNOSIS — E1151 Type 2 diabetes mellitus with diabetic peripheral angiopathy without gangrene: Secondary | ICD-10-CM

## 2020-10-29 DIAGNOSIS — I10 Essential (primary) hypertension: Secondary | ICD-10-CM

## 2020-10-29 DIAGNOSIS — IMO0002 Reserved for concepts with insufficient information to code with codable children: Secondary | ICD-10-CM

## 2020-10-29 MED ORDER — NICOTINE POLACRILEX 4 MG MT GUM: 4.0000 mg | CHEWING_GUM | OROMUCOSAL | 5 refills | Status: DC | PRN

## 2020-10-29 MED ORDER — NICOTINE 21 MG/24HR TD PT24: 21.0000 mg | MEDICATED_PATCH | Freq: Every day | TRANSDERMAL | 2 refills | Status: DC

## 2020-10-29 NOTE — Telephone Encounter (Signed)
I spoke to him about plan of care. He saw Dr. Gilda Crease- left leg is good- working on the right leg next. Advised to walk, stop tobacco and improve DM and lipids.   HLD:  Lipids- need rechecked 12/03/2019. PLAN and will increase Crestor to 10 mg today. He is on trigly med. He was afraid of statins- going slow. Needs labs.   T2DM: He did go up on Metformin to 1 gm BID and tolerating well.  Needs labs.  Nicotine: Working on tobacco cessation went from 1-2 bags per day of chewing tobacco to now 1/2 bag per day. He did this cold [place]. Discussed the importance of tobacco cessation, benefits of quitting, implications of continued use.   PLAN: Begin: Place nicotine patch every 24 hours- expect skin irritation under the patch and place in a new spot every day. If get nightmares- remove the patch at bedtime.   If urge to use chewing tobacco occurs- place gum in mouth and chew until tingles and then park in cheek. This was explained. Do not chew like normal chewing gum, will make nauseated.   You would use the 4 mg dose. He needs nicotine first 30 min in am.  Drug  Alerts with Menthol allergy in [place] rash?   Weeks 1-6: use 1 piece every 1-2 hours.   Weeks 7-8 use 1 piece every 2-4 hours  Weeks 10-12 1 piece every 4-8 hours Duration of therapy is 12 weeks.  He has telephone appt with Catie in Jan- will get follow up labs 3 days prior on 12/03/2019 to address. Pt will make the lab appt.

## 2020-11-07 DIAGNOSIS — E7800 Pure hypercholesterolemia, unspecified: Secondary | ICD-10-CM | POA: Diagnosis not present

## 2020-11-07 DIAGNOSIS — I70213 Atherosclerosis of native arteries of extremities with intermittent claudication, bilateral legs: Secondary | ICD-10-CM | POA: Diagnosis not present

## 2020-11-07 DIAGNOSIS — E782 Mixed hyperlipidemia: Secondary | ICD-10-CM | POA: Diagnosis not present

## 2020-11-07 DIAGNOSIS — I10 Essential (primary) hypertension: Secondary | ICD-10-CM | POA: Diagnosis not present

## 2020-11-07 DIAGNOSIS — I2510 Atherosclerotic heart disease of native coronary artery without angina pectoris: Secondary | ICD-10-CM | POA: Diagnosis not present

## 2020-11-08 MED ORDER — ROSUVASTATIN CALCIUM 10 MG PO TABS: 10.0000 mg | ORAL_TABLET | Freq: Every day | ORAL | 3 refills | Status: DC

## 2020-11-08 NOTE — Telephone Encounter (Signed)
Patient called to note that since Juan Watson increased rosuvastatin to 10 mg daily and didn't update script, his 5 mg dose is going to run out early.   Updated script for rosuvastatin 10 mg daily sent to pharmacy today.

## 2020-11-08 NOTE — Addendum Note (Signed)
Addended by: Lourena Simmonds on: 11/08/2020 11:49 AM   Modules accepted: Orders

## 2020-11-21 ENCOUNTER — Other Ambulatory Visit: Payer: Self-pay

## 2020-11-21 MED ORDER — LABETALOL HCL 200 MG PO TABS
200.0000 mg | ORAL_TABLET | Freq: Two times a day (BID) | ORAL | 0 refills | Status: DC
Start: 2020-11-21 — End: 2021-01-29

## 2020-12-02 ENCOUNTER — Other Ambulatory Visit: Payer: Self-pay | Admitting: Internal Medicine

## 2020-12-02 ENCOUNTER — Other Ambulatory Visit (INDEPENDENT_AMBULATORY_CARE_PROVIDER_SITE_OTHER): Payer: Medicare PPO

## 2020-12-02 ENCOUNTER — Other Ambulatory Visit: Payer: Self-pay

## 2020-12-02 DIAGNOSIS — E1159 Type 2 diabetes mellitus with other circulatory complications: Secondary | ICD-10-CM

## 2020-12-02 DIAGNOSIS — R946 Abnormal results of thyroid function studies: Secondary | ICD-10-CM

## 2020-12-02 DIAGNOSIS — E1165 Type 2 diabetes mellitus with hyperglycemia: Secondary | ICD-10-CM | POA: Diagnosis not present

## 2020-12-02 DIAGNOSIS — E1151 Type 2 diabetes mellitus with diabetic peripheral angiopathy without gangrene: Secondary | ICD-10-CM

## 2020-12-02 DIAGNOSIS — I152 Hypertension secondary to endocrine disorders: Secondary | ICD-10-CM

## 2020-12-02 DIAGNOSIS — E785 Hyperlipidemia, unspecified: Secondary | ICD-10-CM

## 2020-12-02 DIAGNOSIS — I10 Essential (primary) hypertension: Secondary | ICD-10-CM | POA: Diagnosis not present

## 2020-12-02 DIAGNOSIS — IMO0002 Reserved for concepts with insufficient information to code with codable children: Secondary | ICD-10-CM

## 2020-12-02 LAB — CBC WITH DIFFERENTIAL/PLATELET
Basophils Absolute: 0.1 10*3/uL (ref 0.0–0.1)
Basophils Relative: 1.4 % (ref 0.0–3.0)
Eosinophils Absolute: 0.6 10*3/uL (ref 0.0–0.7)
Eosinophils Relative: 7.8 % — ABNORMAL HIGH (ref 0.0–5.0)
HCT: 40.2 % (ref 39.0–52.0)
Hemoglobin: 13.6 g/dL (ref 13.0–17.0)
Lymphocytes Relative: 20.2 % (ref 12.0–46.0)
Lymphs Abs: 1.5 10*3/uL (ref 0.7–4.0)
MCHC: 33.8 g/dL (ref 30.0–36.0)
MCV: 81.4 fl (ref 78.0–100.0)
Monocytes Absolute: 0.6 10*3/uL (ref 0.1–1.0)
Monocytes Relative: 8.3 % (ref 3.0–12.0)
Neutro Abs: 4.7 10*3/uL (ref 1.4–7.7)
Neutrophils Relative %: 62.3 % (ref 43.0–77.0)
Platelets: 214 10*3/uL (ref 150.0–400.0)
RBC: 4.93 Mil/uL (ref 4.22–5.81)
RDW: 13.4 % (ref 11.5–15.5)
WBC: 7.6 10*3/uL (ref 4.0–10.5)

## 2020-12-02 LAB — MICROALBUMIN / CREATININE URINE RATIO
Creatinine,U: 105.8 mg/dL
Microalb Creat Ratio: 2.5 mg/g (ref 0.0–30.0)
Microalb, Ur: 2.6 mg/dL — ABNORMAL HIGH (ref 0.0–1.9)

## 2020-12-02 LAB — COMPREHENSIVE METABOLIC PANEL
ALT: 32 U/L (ref 0–53)
AST: 18 U/L (ref 0–37)
Albumin: 4.6 g/dL (ref 3.5–5.2)
Alkaline Phosphatase: 40 U/L (ref 39–117)
BUN: 20 mg/dL (ref 6–23)
CO2: 28 mEq/L (ref 19–32)
Calcium: 9.8 mg/dL (ref 8.4–10.5)
Chloride: 101 mEq/L (ref 96–112)
Creatinine, Ser: 1.11 mg/dL (ref 0.40–1.50)
GFR: 67.55 mL/min (ref >60.00)
Glucose, Bld: 175 mg/dL — ABNORMAL HIGH (ref 70–99)
Potassium: 4.3 mEq/L (ref 3.5–5.1)
Sodium: 139 mEq/L (ref 135–145)
Total Bilirubin: 0.4 mg/dL (ref 0.2–1.2)
Total Protein: 6.5 g/dL (ref 6.0–8.3)

## 2020-12-02 LAB — LIPID PANEL
Cholesterol: 180 mg/dL (ref 0–200)
HDL: 26.8 mg/dL — ABNORMAL LOW (ref >39.00)
Total CHOL/HDL Ratio: 7
Triglycerides: 463 mg/dL — ABNORMAL HIGH (ref 0.0–149.0)

## 2020-12-02 LAB — TSH: TSH: 5.96 u[IU]/mL — ABNORMAL HIGH (ref 0.35–4.50)

## 2020-12-02 LAB — LDL CHOLESTEROL, DIRECT: Direct LDL: 53 mg/dL

## 2020-12-04 ENCOUNTER — Other Ambulatory Visit (INDEPENDENT_AMBULATORY_CARE_PROVIDER_SITE_OTHER): Payer: Medicare PPO

## 2020-12-04 DIAGNOSIS — R946 Abnormal results of thyroid function studies: Secondary | ICD-10-CM | POA: Diagnosis not present

## 2020-12-04 DIAGNOSIS — E1159 Type 2 diabetes mellitus with other circulatory complications: Secondary | ICD-10-CM | POA: Diagnosis not present

## 2020-12-04 DIAGNOSIS — I152 Hypertension secondary to endocrine disorders: Secondary | ICD-10-CM | POA: Diagnosis not present

## 2020-12-04 LAB — T3, FREE: T3, Free: 3.1 pg/mL (ref 2.3–4.2)

## 2020-12-05 ENCOUNTER — Ambulatory Visit: Payer: Medicare PPO | Admitting: Pharmacist

## 2020-12-05 DIAGNOSIS — E785 Hyperlipidemia, unspecified: Secondary | ICD-10-CM

## 2020-12-05 DIAGNOSIS — E1151 Type 2 diabetes mellitus with diabetic peripheral angiopathy without gangrene: Secondary | ICD-10-CM

## 2020-12-05 DIAGNOSIS — E1159 Type 2 diabetes mellitus with other circulatory complications: Secondary | ICD-10-CM

## 2020-12-05 DIAGNOSIS — E782 Mixed hyperlipidemia: Secondary | ICD-10-CM

## 2020-12-05 DIAGNOSIS — I152 Hypertension secondary to endocrine disorders: Secondary | ICD-10-CM

## 2020-12-05 DIAGNOSIS — IMO0002 Reserved for concepts with insufficient information to code with codable children: Secondary | ICD-10-CM

## 2020-12-05 DIAGNOSIS — I2510 Atherosclerotic heart disease of native coronary artery without angina pectoris: Secondary | ICD-10-CM

## 2020-12-05 LAB — THYROID PEROXIDASE ANTIBODY: Thyroperoxidase Ab SerPl-aCnc: <1 IU/mL (ref <9)

## 2020-12-05 LAB — HEMOGLOBIN A1C: Hgb A1c MFr Bld: 6.9 % — ABNORMAL HIGH (ref 4.6–6.5)

## 2020-12-05 LAB — T4, FREE: Free T4: 0.89 ng/dL (ref 0.60–1.60)

## 2020-12-05 NOTE — Chronic Care Management (AMB) (Signed)
Chronic Care Management   Pharmacy Note  12/05/2020 Name: Juan Watson MRN: [id number] DOB: 11/29/1951  Subjective:  Juan Watson is a 70 y.o. year old male who is a primary care patient of Theadore Nan, NP. Collaborating with covering provider Dr. Birdie Sons. The CCM team was consulted for assistance with chronic disease management and care coordination needs.    Engaged with patient by telephone for follow up visit in response to provider referral for pharmacy case management and/or care coordination services.   Consent to Services:  Patient was given information about Chronic Care Management services, agreed to services, and gave verbal consent prior to initiation of services on 09/05/20. Please see initial visit note for detailed documentation.   Objective:  Lab Results  Component Value Date   CREATININE 1.11 12/02/2020   CREATININE 1.06 09/24/2020   CREATININE 1.14 08/08/2020    Lab Results  Component Value Date   HGBA1C 6.9 (H) 12/04/2020       Component Value Date/Time   CHOL 180 12/02/2020 0812   TRIG (H) 12/02/2020 0812    463.0 Triglyceride is over 400; calculations on Lipids are invalid.   HDL 26.80 (L) 12/02/2020 0812   CHOLHDL 7 12/02/2020 0812   LDLDIRECT 53.0 12/02/2020 0812    BP Readings from Last 3 Encounters:  10/21/20 107/66  10/14/20 130/66  09/24/20 (!) 141/64    Assessment/Interventions: Review of patient past medical history, allergies, medications, health status, including review of consultants reports, laboratory and other test data, was performed as part of comprehensive evaluation and provision of chronic care management services.   SDOH (Social Determinants of Health) assessments and interventions performed:  SDOH Interventions   Flowsheet Row Most Recent Value  SDOH Interventions   Financial Strain Interventions Intervention Not Indicated       CCM Care Plan  Allergies  Allergen Reactions  . Bee Venom Shortness Of Breath  and Swelling  . Sulfa Antibiotics Swelling    Tongue swelling  . Ben Gay Ultra [Menthol] Other (See Comments)    Whelts and redness of skin    Medications Reviewed Today    Reviewed by Lourena Simmonds, RPH-CPP (Pharmacist) on 12/05/20 at 1416  Med List Status: <None>  Medication Order Taking? Sig Documenting Provider Last Dose Status Informant  acetaminophen (TYLENOL) 500 MG tablet [id number]  Take 1,000 mg by mouth every 6 (six) hours as needed for mild pain or headache.   Patient not taking: Reported on 10/21/2020   [provider]  Active Self  albuterol (VENTOLIN HFA) 108 (90 Base) MCG/ACT inhaler [id number]  Inhale 2 puffs into the lungs every 6 (six) hours as needed for wheezing or shortness of breath.  Patient not taking: Reported on 10/21/2020   Darci Current, MD  Active Spouse/Significant Other  amLODipine (NORVASC) 10 MG tablet [id number] Yes Take 1 tablet (10 mg total) by mouth daily. Theadore Nan, NP Taking Active Spouse/Significant Other  aspirin EC 81 MG tablet [id number] Yes Take 81 mg by mouth at bedtime. [provider] Taking Active Spouse/Significant Other  chlorthalidone (HYGROTON) 25 MG tablet [id number] Yes Take 25 mg by mouth daily. [provider] Taking Active   cloNIDine (CATAPRES) 0.1 MG tablet [id number] Yes TAKE 1 TABLET TWICE DAILY  Patient taking differently: Take 0.1 mg by mouth 2 (two) times daily.   Theadore Nan, NP Taking Active Spouse/Significant Other  diclofenac (VOLTAREN) 75 MG EC tablet [id number] Yes TAKE 1 TABLET TWICE DAILY AS NEEDED Amedeo Kinsman  A, NP Taking Active   glucose blood test strip [id number] Yes Use as instructed Marval Regal, NP Taking Active Spouse/Significant Other  icosapent Ethyl (VASCEPA) 1 g capsule [id number] Yes Take 2 capsules (2 g total) by mouth 2 (two) times daily. Marval Regal, NP Taking Active Spouse/Significant Other  labetalol (NORMODYNE) 200 MG tablet [id number] Yes Take 1  tablet (200 mg total) by mouth 2 (two) times daily. Burnard Hawthorne, FNP Taking Active   metFORMIN (GLUCOPHAGE) 500 MG tablet [id number] Yes Take 2 tablets (1,000 mg total) by mouth 2 (two) times daily with a meal. Marval Regal, NP Taking Active   nicotine (NICODERM CQ - DOSED IN MG/24 HOURS) 21 mg/24hr patch [id number]  Place 1 patch (21 mg total) onto the skin daily. Marval Regal, NP  Active   nicotine polacrilex (NICORETTE) 4 MG gum [id number]  Take 1 each (4 mg total) by mouth as needed for smoking cessation. NO MENTHOL flavor as he is allergic Marval Regal, NP  Active   omeprazole (PRILOSEC) 20 MG capsule [id number] Yes Take 1 capsule (20 mg total) by mouth daily. Marval Regal, NP Taking Active Spouse/Significant Other  prasugrel (EFFIENT) 10 MG TABS tablet [id number] Yes Take 10 mg by mouth daily.  [provider] Taking Active Spouse/Significant Other  rosuvastatin (CRESTOR) 10 MG tablet [id number] Yes Take 1 tablet (10 mg total) by mouth daily. Leone Haven, MD Taking Active   telmisartan (MICARDIS) 80 MG tablet [id number] Yes Take 80 mg by mouth daily with supper.  [provider] Taking Active Spouse/Significant Other  vitamin B-12 (CYANOCOBALAMIN) 1000 MCG tablet [id number] Yes Take 1,000 mcg by mouth daily with supper.  [provider] Taking Active Spouse/Significant Other          Patient Active Problem List   Diagnosis Date Noted  . DM (diabetes mellitus) type II uncontrolled, periph vascular disorder ([place]) 10/16/2020  . Tobacco use disorder 10/16/2020  . Mixed hyperlipidemia 10/16/2020  . Elevated TSH 08/11/2020  . Atherosclerosis of native arteries of extremity with intermittent claudication ([place]) 07/29/2020  . Coronary artery disease involving native coronary artery of native heart 04/25/2020  . S/P drug eluting coronary stent placement 04/11/2020  . Angina pectoris ([place]) 04/05/2020  . FH: colon cancer in relative diagnosed at  >86 years old 06/30/2019  . Nocturnal muscle cramps 04/05/2018  . Status post arthroscopy 12/28/2017  . Arthrofibrosis of total knee arthroplasty, initial encounter ([place]) 12/16/2017  . Adhesive capsulitis of knee, right 11/01/2017  . Cholecystitis 06/28/2017  . Aortic valve sclerosis 11/04/2016  . Grief reaction 07/31/2016  . Acquired right flat foot 11/15/2015  . Arthritis of knee, degenerative 04/15/2015  . Primary osteoarthritis of right knee 04/14/2015  . Skin lesion 03/11/2015  . Burning or prickling sensation 10/29/2014  . Encounter for screening for malignant neoplasm of prostate 10/29/2014  . Familial aortic aneurysm 03/26/2014  . Gout 03/26/2014  . Family history of cardiovascular disease 03/26/2014  . Gastro-esophageal reflux disease without esophagitis 08/11/2013  . Generalized OA 08/11/2013  . Disease of nail 07/16/2011  . Paronychia of toe 07/16/2011  . Benign essential HTN 10/17/2010    Conditions to be addressed/monitored: CAD, HTN, HLD, DM and tobacco use  Patient Care Plan: Pharmacy Disease Management    Problem Identified: Disease Progression (Diabetes, Hyperlipidemia, Tobacco Use)     Long-Range Goal: Disease Progression Prevented or Minimized   Start Date: 10/04/2020  Expected End Date: 01/04/2021  Recent Progress: On track  Priority:  High  Note:   Current Barriers:  . Unable to achieve control of tobacco use, hyperlipidemia   Pharmacist Clinical Goal(s):  Marland Kitchen Over the next 90 days, patient will achieve improvement in lipids through collaboration with PharmD and provider.   Interventions: . 1:1 collaboration with Theadore Nan, NP (though collaborating with covering physician Doree Fudge MD) regarding development and update of comprehensive plan of care as evidenced by provider attestation and co-signature . Inter-disciplinary care team collaboration (see longitudinal plan of care) . Comprehensive medication review performed; medication list updated  in electronic medical record  Hyperlipidemia in the setting of CAD: . Uncontrolled per TG, but LDL controlled; current treatment: rosuvastatin 10 mg daily, Vascepa 2 g BID; reports tolerating rosuvastatin 10 mg dose well, denies any muscle effects . Medications previously tried: Previously on gemfibrozil; this was d/c when rosuvastatin was started  . Diet: patient denies any alcohol use. Denies sugary beverages or sweets, minimizing carbohydrates d/t DM . Discussed elevated TG. Patient reports hx pancreatitis. Contacted cardiology Dr. Gwen Pounds, LVM asking if he would like to adjust anything with patient's regimen given elevated TG. Discussed rosuvastatin increase, but this would only likely provide incremental increase in comparison to addition of fenofibrate. Would NOT re-start gemfibrozil given DDI with rosuvastatin. Receive return call from [facility] nurse that she would pass the message along to cardiology. Patient to establish with new PCP in March.   Diabetes: . Controlled; current treatment: metformin XR 500 mg BID (max tolerated dose d/t GI upset)  . Praised for maintenance of goal A1c. Discussed GI upset with metformin, patient tolerating 500 mg BID dose. Will re-send script for this dose. Continue home monitoring. Discussed that if A1c does not remain controlled, recommend addition of SGLT2 or GLP1 for ASCVD risk reduction benefit  CAD, severe PVD: . HTN: Controlled; current treatment:  10 mg QAM, clonidine 0.1 mg BID, chlorthalidone 25 mg QAM, labetolol 200 mg BID, telmisartan 80 mg QPM; follows w/ Dr. Gwen Pounds . Antiplatelet regimen: prasugrel 10 mg QAM, aspirin 81 mg daily . Recommended to continue current regimen with collaboration with cardiology   Tobacco Abuse: . Chewing tobacco daily. Previously discussed cessation with prior PCP; scripts for NRT sent. . Encouraged to continue focus on cessation. Encouraged to contact [place] [employer]uit[employer] for support  Patient Goals/Self-Care  Activities . Over the next 90 days, patient will:  - take medications as prescribed  Follow Up Plan: Will await f/u from cardiology. Patient to establish with new PCP in March.      Medication Assistance: None required. Patient affirms current coverage meets needs.   Plan: Telephone follow up appointment with care management team member scheduled for: patient to establish with new PCP  Catie Feliz Beam, PharmD, BCACP, CPP Clinical Pharmacist [employer] at [facility] [phone]

## 2020-12-05 NOTE — Patient Instructions (Signed)
Visit Information  Patient Care Plan: Pharmacy Disease Management    Problem Identified: Disease Progression (Diabetes, Hyperlipidemia, Tobacco Use)     Long-Range Goal: Disease Progression Prevented or Minimized   Start Date: 10/04/2020  Expected End Date: 01/04/2021  Recent Progress: On track  Priority: High  Note:   Current Barriers:  . Unable to achieve control of tobacco use, hyperlipidemia   Pharmacist Clinical Goal(s):  Marland Kitchen Over the next 90 days, patient will achieve improvement in lipids through collaboration with PharmD and provider.   Interventions: . 1:1 collaboration with Theadore Nan, NP (though collaborating with covering physician Doree Fudge MD) regarding development and update of comprehensive plan of care as evidenced by provider attestation and co-signature . Inter-disciplinary care team collaboration (see longitudinal plan of care) . Comprehensive medication review performed; medication list updated in electronic medical record  Hyperlipidemia in the setting of CAD: . Uncontrolled per TG, but LDL controlled; current treatment: rosuvastatin 10 mg daily, Vascepa 2 g BID; reports tolerating rosuvastatin 10 mg dose well, denies any muscle effects . Medications previously tried: Previously on gemfibrozil; this was d/c when rosuvastatin was started  . Diet: patient denies any alcohol use. Denies sugary beverages or sweets, minimizing carbohydrates d/t DM . Discussed elevated TG. Patient reports hx pancreatitis. Contacted cardiology Dr. Gwen Pounds, LVM asking if he would like to adjust anything with patient's regimen given elevated TG. Discussed rosuvastatin increase, but this would only likely provide incremental increase in comparison to addition of fenofibrate. Would NOT re-start gemfibrozil given DDI with rosuvastatin. Receive return call from [facility] nurse that she would pass the message along to cardiology. Patient to establish with new PCP in March.    Diabetes: . Controlled; current treatment: metformin XR 500 mg BID (max tolerated dose d/t GI upset)  . Praised for maintenance of goal A1c. Discussed GI upset with metformin, patient tolerating 500 mg BID dose. Will re-send script for this dose. Continue home monitoring. Discussed that if A1c does not remain controlled, recommend addition of SGLT2 or GLP1 for ASCVD risk reduction benefit  CAD, severe PVD: . HTN: Controlled; current treatment:  10 mg QAM, clonidine 0.1 mg BID, chlorthalidone 25 mg QAM, labetolol 200 mg BID, telmisartan 80 mg QPM; follows w/ Dr. Gwen Pounds . Antiplatelet regimen: prasugrel 10 mg QAM, aspirin 81 mg daily . Recommended to continue current regimen with collaboration with cardiology   Tobacco Abuse: . Chewing tobacco daily. Previously discussed cessation with prior PCP; scripts for NRT sent. . Encouraged to continue focus on cessation. Encouraged to contact [place] [employer]uit[employer] for support  Patient Goals/Self-Care Activities . Over the next 90 days, patient will:  - take medications as prescribed  Follow Up Plan: Will await f/u from cardiology. Patient to establish with new PCP in March.      The patient verbalized understanding of instructions, educational materials, and care plan provided today and declined offer to receive copy of patient instructions, educational materials, and care plan.  Plan: Telephone follow up appointment with care management team member scheduled for: patient to establish with new PCP  Catie Feliz Beam, PharmD, BCACP, CPP Clinical Pharmacist [employer] at [facility] [phone]

## 2020-12-05 NOTE — Progress Notes (Signed)
Agree Dr. Nevea Spiewak [place]uzza  

## 2020-12-17 ENCOUNTER — Other Ambulatory Visit: Payer: Self-pay

## 2020-12-17 MED ORDER — AMLODIPINE BESYLATE 10 MG PO TABS: 10.0000 mg | ORAL_TABLET | Freq: Every day | ORAL | 1 refills | Status: DC

## 2020-12-17 NOTE — Telephone Encounter (Signed)
In reviewing his medication list, it appears that Juan Watson has sent in rx for amlodipine to [facility].  Is he going to have enough to cover until mail order comes in?

## 2020-12-17 NOTE — Telephone Encounter (Signed)
Patient has an appointment on 02/07/21 to establish care with new provider onk to send amlodipine to [facility] until appt?

## 2020-12-17 NOTE — Addendum Note (Signed)
Addended by: Dennie Bible on: 12/17/2020 02:01 PM   Modules accepted: Orders

## 2020-12-17 NOTE — Addendum Note (Signed)
Addended by: Charm Barges on: 12/17/2020 09:15 PM   Modules accepted: Orders

## 2020-12-17 NOTE — Telephone Encounter (Signed)
Pt called and requested a refill on amLODipine (NORVASC) 10 MG tablet . He states that Dr Hyacinth Meeker is going to be his primary and he has an appt with him coming up. He just needs meds to get him through until then. Please send to [facility]. He has five pills left

## 2020-12-19 ENCOUNTER — Encounter: Payer: Self-pay | Admitting: *Deleted

## 2020-12-19 ENCOUNTER — Other Ambulatory Visit (INDEPENDENT_AMBULATORY_CARE_PROVIDER_SITE_OTHER): Payer: Self-pay | Admitting: Vascular Surgery

## 2020-12-20 NOTE — Telephone Encounter (Signed)
Patient Verified having medication to last until his prescription arrives from [place].

## 2021-01-22 ENCOUNTER — Other Ambulatory Visit (INDEPENDENT_AMBULATORY_CARE_PROVIDER_SITE_OTHER): Payer: Self-pay | Admitting: Vascular Surgery

## 2021-01-22 DIAGNOSIS — I739 Peripheral vascular disease, unspecified: Secondary | ICD-10-CM

## 2021-01-23 ENCOUNTER — Encounter (INDEPENDENT_AMBULATORY_CARE_PROVIDER_SITE_OTHER): Payer: Self-pay | Admitting: Vascular Surgery

## 2021-01-23 ENCOUNTER — Other Ambulatory Visit: Payer: Self-pay

## 2021-01-23 ENCOUNTER — Ambulatory Visit (INDEPENDENT_AMBULATORY_CARE_PROVIDER_SITE_OTHER): Payer: Medicare PPO

## 2021-01-23 ENCOUNTER — Ambulatory Visit (INDEPENDENT_AMBULATORY_CARE_PROVIDER_SITE_OTHER): Payer: Medicare PPO | Admitting: Vascular Surgery

## 2021-01-23 VITALS — BP 119/70 | HR 62 | Resp 16 | Wt 232.8 lb

## 2021-01-23 DIAGNOSIS — I70213 Atherosclerosis of native arteries of extremities with intermittent claudication, bilateral legs: Secondary | ICD-10-CM | POA: Diagnosis not present

## 2021-01-23 DIAGNOSIS — I739 Peripheral vascular disease, unspecified: Secondary | ICD-10-CM | POA: Diagnosis not present

## 2021-01-23 DIAGNOSIS — I10 Essential (primary) hypertension: Secondary | ICD-10-CM

## 2021-01-23 DIAGNOSIS — I25118 Atherosclerotic heart disease of native coronary artery with other forms of angina pectoris: Secondary | ICD-10-CM

## 2021-01-23 DIAGNOSIS — IMO0002 Reserved for concepts with insufficient information to code with codable children: Secondary | ICD-10-CM

## 2021-01-23 DIAGNOSIS — E1165 Type 2 diabetes mellitus with hyperglycemia: Secondary | ICD-10-CM

## 2021-01-23 DIAGNOSIS — E1151 Type 2 diabetes mellitus with diabetic peripheral angiopathy without gangrene: Secondary | ICD-10-CM

## 2021-01-23 NOTE — Progress Notes (Signed)
MRN : [id number]  Juan Watson is a 70 y.o. ([birth year]) male who presents with chief complaint of No chief complaint on file. Marland Kitchen  History of Present Illness:   The patient returns to the office for followup and review status post angiogram with intervention on September 24, 2020.  Intervention performed: Percutaneous transluminal angioplasty and stent placement to the left superficial femoral and popliteal arteries with percutaneous transluminal angioplasty to 3 mm of the left peroneal.  The patient has single-vessel runoff via the peroneal.  The patient notes improvement in the lower extremity symptoms. No interval shortening of the patient's claudication distance or rest pain symptoms. Previous wounds have now healed.  No new ulcers or wounds have occurred since the last visit.  There have been no significant changes to the patient's overall health care.  The patient denies amaurosis fugax or recent TIA symptoms. There are no recent neurological changes noted. The patient denies history of DVT, PE or superficial thrombophlebitis. The patient denies recent episodes of angina or shortness of breath.   ABI's Rt=1.02 and Lt=1.14  (previous ABI's Rt=1.02 and Lt=1.14)  Duplex ultrasound of the left lower extremity shows patent SFA; 60% common femoral stenosis unchanged  No outpatient medications have been marked as taking for the 01/23/21 encounter (Appointment) with Delana Meyer, Dolores Lory, MD.    Past Medical History:  Diagnosis Date  . Acquired flat foot 11/15/2015  . Arthritis of knee, degenerative 04/15/2015  . BPH (benign prostatic hyperplasia)   . Chewing tobacco nicotine dependence without complication 9/52/8413  . Cholecystitis 06/28/2017  . Diabetes mellitus without complication ([place])   . Diabetes mellitus, type 2 ([place]) 12/13/2002   Overview:  DIET CONTROLLED   . Disease of nail 07/16/2011  . Encounter for screening for malignant neoplasm of prostate 10/29/2014  . Essential  (primary) hypertension 10/17/2010  . Familial aortic aneurysm 03/26/2014  . Family history of cardiovascular disease 03/26/2014  . Gastro-esophageal reflux disease without esophagitis 08/11/2013  . Generalized OA 08/11/2013  . Gout 03/26/2014  . Heart murmur   . HOH (hard of hearing)    aids  . Motion sickness    "sea sick"  . Primary osteoarthritis of right knee 04/14/2015  . Pure hypercholesterolemia 12/13/2002  . Skin lesion 03/11/2015    Past Surgical History:  Procedure Laterality Date  . BACK SURGERY  [phone]  . CARDIAC CATHETERIZATION  70's   pericarditis  . CATARACT EXTRACTION W/PHACO Right 03/23/2016   Procedure: CATARACT EXTRACTION PHACO AND INTRAOCULAR LENS PLACEMENT (IOC);  Surgeon: Estill Cotta, MD;  Location: [facility];  Service: Ophthalmology;  Laterality: Right;  [place] 01:31   . CATARACT EXTRACTION W/PHACO Left 10/23/2019   Procedure: CATARACT EXTRACTION PHACO AND INTRAOCULAR LENS PLACEMENT (IOC) LEFT DIABETIC TORIC LENS 5.82,   00:44.1;  Surgeon: Eulogio Bear, MD;  Location: [place];  Service: Ophthalmology;  Laterality: Left;  Diabetic - oral meds  . CHOLECYSTECTOMY N/A 06/29/2017   Procedure: LAPAROSCOPIC CHOLECYSTECTOMY;  Surgeon: Olean Ree, MD;  Location: [facility];  Service: General;  Laterality: N/A;  . COLONOSCOPY WITH PROPOFOL N/A 10/06/2017   Procedure: COLONOSCOPY WITH PROPOFOL;  Surgeon: Manya Silvas, MD;  Location: [facility];  Service: Endoscopy;  Laterality: N/A;  . CORONARY STENT INTERVENTION N/A 04/11/2020   Procedure: CORONARY STENT INTERVENTION;  Surgeon: Yolonda Kida, MD;  Location: [place] [facility];  Service: Cardiovascular;  Laterality: N/A;  . HERNIA REPAIR  11,   umbilical x2  . KNEE ARTHROSCOPY Right 12/20/2017  Procedure: ARTHROSCOPY KNEE EXTENSIVE SYNOVECTOMY AND LYSIS OF ADHESIONS;  Surgeon: Dereck Leep, MD;  Location: [facility];  Service: Orthopedics;  Laterality: Right;  . KNEE DEBRIDEMENT Left    tendon   . LEFT HEART CATH AND CORONARY ANGIOGRAPHY Left 04/11/2020   Procedure: LEFT HEART CATH AND CORONARY ANGIOGRAPHY;  Surgeon: Corey Skains, MD;  Location: [place] [facility];  Service: Cardiovascular;  Laterality: Left;  . LOWER EXTREMITY ANGIOGRAPHY Left 09/24/2020   Procedure: LOWER EXTREMITY ANGIOGRAPHY;  Surgeon: Katha Cabal, MD;  Location: [place] [facility];  Service: Cardiovascular;  Laterality: Left;  . TONSILLECTOMY  72  . TOTAL KNEE ARTHROPLASTY Right 04/15/2015   Procedure: TOTAL KNEE ARTHROPLASTY;  Surgeon: Frederik Pear, MD;  Location: [place];  Service: Orthopedics;  Laterality: Right;  . TRANSURETHRAL RESECTION OF PROSTATE  ?    Social History Social History   Tobacco Use  . Smoking status: Former Smoker    Packs/day: 2.00    Years: 25.00    Pack years: 50.00    Types: Cigarettes    Quit date: 07/15/1992    Years since quitting: 28.5  . Smokeless tobacco: Current User    Types: Chew  Vaping Use  . Vaping Use: Never used  Substance Use Topics  . Alcohol use: No  . Drug use: No    Family History Family History  Problem Relation Age of Onset  . Multiple myeloma Mother   . Aneurysm Father   . Aneurysm Brother   . Nephrolithiasis Neg Hx   . Bladder Cancer Neg Hx   . Prostate cancer Neg Hx   . Kidney cancer Neg Hx     Allergies  Allergen Reactions  . Bee Venom Shortness Of Breath and Swelling  . Sulfa Antibiotics Swelling    Tongue swelling  . Ben Gay Ultra [Menthol] Other (See Comments)    Whelts and redness of skin     REVIEW OF SYSTEMS (Negative unless checked)  Constitutional: [] Weight loss  [] Fever  [] Chills Cardiac: [] Chest pain   [] Chest pressure   [] Palpitations   [] Shortness of breath when laying flat   [] Shortness of breath with exertion. Vascular:  [x] Pain in legs with walking   [] Pain in legs at rest  [] History of DVT   [] Phlebitis   [x] Swelling in legs   [] Varicose veins   [] Non-healing ulcers Pulmonary:   [] Uses home oxygen    [] Productive cough   [] Hemoptysis   [] Wheeze  [] COPD   [] Asthma Neurologic:  [] Dizziness   [] Seizures   [] History of stroke   [] History of TIA  [] Aphasia   [] Vissual changes   [] Weakness or numbness in arm   [] Weakness or numbness in leg Musculoskeletal:   [] Joint swelling   [] Joint pain   [] Low back pain Hematologic:  [] Easy bruising  [] Easy bleeding   [] Hypercoagulable state   [] Anemic Gastrointestinal:  [] Diarrhea   [] Vomiting  [] Gastroesophageal reflux/heartburn   [] Difficulty swallowing. Genitourinary:  [] Chronic kidney disease   [] Difficult urination  [] Frequent urination   [] Blood in urine Skin:  [] Rashes   [] Ulcers  Psychological:  [] History of anxiety   []  History of major depression.  Physical Examination  There were no vitals filed for this visit. There is no height or weight on file to calculate BMI. Gen: WD/WN, NAD Head: [place]/AT, No temporalis wasting.  Ear/Nose/Throat: Hearing grossly intact, nares w/o erythema or drainage Eyes: PER, EOMI, sclera nonicteric.  Neck: Supple, no large masses.   Pulmonary:  Good air movement, no  audible wheezing bilaterally, no use of accessory muscles.  Cardiac: RRR, no JVD Vascular:  Vessel Right Left  Radial Palpable Palpable  PT Palpable Palpable  DP Not Palpable Palpable  Gastrointestinal: Non-distended. No guarding/no peritoneal signs.  Musculoskeletal: M/S 5/5 throughout.  No deformity or atrophy.  Neurologic: CN 2-12 intact. Symmetrical.  Speech is fluent. Motor exam as listed above. Psychiatric: Judgment intact, Mood & affect appropriate for pt's clinical situation. Dermatologic: No rashes or ulcers noted.  No changes consistent with cellulitis.   CBC Lab Results  Component Value Date   WBC 7.6 12/02/2020   HGB 13.6 12/02/2020   HCT 40.2 12/02/2020   MCV 81.4 12/02/2020   PLT 214.0 12/02/2020    BMET    Component Value Date/Time   NA 139 12/02/2020 0812   K 4.3 12/02/2020 0812   CL 101 12/02/2020 0812   CO2 28  12/02/2020 0812   GLUCOSE 175 (H) 12/02/2020 0812   BUN 20 12/02/2020 0812   CREATININE 1.11 12/02/2020 0812   CALCIUM 9.8 12/02/2020 0812   GFRNONAA >60 09/24/2020 0733   GFRAA >60 02/13/2020 1809   CrCl cannot be calculated (Patient's most recent lab result is older than the maximum 21 days allowed.).  COAG Lab Results  Component Value Date   INR 0.96 04/05/2015    Radiology No results found.   Assessment/Plan 1. Atherosclerosis of native artery of both lower extremities with intermittent claudication (HCC) Recommend:  The patient is status post successful angiogram with intervention.  The patient reports that the claudication symptoms and leg pain is essentially gone.   The patient denies lifestyle limiting changes at this point in time.  No further invasive studies, angiography or surgery at this time The patient should continue walking and begin a more formal exercise program.  The patient should continue antiplatelet therapy and aggressive treatment of the lipid abnormalities  Smoking cessation was again discussed  The patient should continue wearing graduated compression socks 10-15 mmHg strength to control the mild edema.  Patient should undergo noninvasive studies as ordered. The patient will follow up with me after the studies.   - VAS [place] LOWER EXTREMITY ARTERIAL DUPLEX; Future - VAS [place] ABI WITH/WO TBI; Future  2. Coronary artery disease of native artery of native heart with stable angina pectoris (HCC) Continue cardiac and antihypertensive medications as already ordered and reviewed, no changes at this time.  Continue statin as ordered and reviewed, no changes at this time  Nitrates PRN for chest pain   3. Benign essential HTN Continue antihypertensive medications as already ordered, these medications have been reviewed and there are no changes at this time.   4. DM (diabetes mellitus) type II uncontrolled, periph vascular disorder (HCC) Continue  hypoglycemic medications as already ordered, these medications have been reviewed and there are no changes at this time.  Hgb A1C to be monitored as already arranged by primary service    Hortencia Pilar, MD  01/23/2021 10:07 AM

## 2021-01-24 ENCOUNTER — Encounter (INDEPENDENT_AMBULATORY_CARE_PROVIDER_SITE_OTHER): Payer: Self-pay | Admitting: Vascular Surgery

## 2021-01-28 ENCOUNTER — Other Ambulatory Visit: Payer: Self-pay | Admitting: Family

## 2021-02-03 ENCOUNTER — Telehealth: Payer: Self-pay | Admitting: Nurse Practitioner

## 2021-02-03 DIAGNOSIS — M179 Osteoarthritis of knee, unspecified: Secondary | ICD-10-CM

## 2021-02-03 DIAGNOSIS — M1710 Unilateral primary osteoarthritis, unspecified knee: Secondary | ICD-10-CM

## 2021-02-03 MED ORDER — DICLOFENAC SODIUM 75 MG PO TBEC: 75.0000 mg | DELAYED_RELEASE_TABLET | Freq: Two times a day (BID) | ORAL | 0 refills | Status: DC | PRN

## 2021-02-03 MED ORDER — OMEPRAZOLE 20 MG PO CPDR: 20.0000 mg | DELAYED_RELEASE_CAPSULE | Freq: Every day | ORAL | 1 refills | Status: DC

## 2021-02-03 NOTE — Telephone Encounter (Signed)
Call pt Provided 14 day supply of both until he can est with new pcp

## 2021-02-03 NOTE — Telephone Encounter (Signed)
LM that 14 day prescription refills were supplied until he establishes with Dr. Hyacinth Meeker. I asked that he call back if any further questions.

## 2021-02-03 NOTE — Telephone Encounter (Signed)
Pt needs a refill on diclofenac (VOLTAREN) 75 MG EC tablet and omeprazole (PRILOSEC) 20 MG capsule sent to [facility]  He has a new pt appt with Dr. Hyacinth Meeker on Friday but will be out of medication before appt former [place] pt

## 2021-02-03 NOTE — Addendum Note (Signed)
Addended by: Allegra Grana on: 02/03/2021 10:21 AM   Modules accepted: Orders

## 2021-02-07 DIAGNOSIS — I739 Peripheral vascular disease, unspecified: Secondary | ICD-10-CM | POA: Insufficient documentation

## 2021-02-13 ENCOUNTER — Encounter (INDEPENDENT_AMBULATORY_CARE_PROVIDER_SITE_OTHER): Payer: Medicare PPO

## 2021-02-13 ENCOUNTER — Ambulatory Visit (INDEPENDENT_AMBULATORY_CARE_PROVIDER_SITE_OTHER): Payer: Medicare PPO | Admitting: Vascular Surgery

## 2021-04-24 ENCOUNTER — Encounter (INDEPENDENT_AMBULATORY_CARE_PROVIDER_SITE_OTHER): Payer: Medicare PPO

## 2021-04-24 ENCOUNTER — Ambulatory Visit (INDEPENDENT_AMBULATORY_CARE_PROVIDER_SITE_OTHER): Payer: Medicare PPO | Admitting: Vascular Surgery

## 2021-05-19 ENCOUNTER — Other Ambulatory Visit: Payer: Self-pay | Admitting: Family

## 2021-06-11 ENCOUNTER — Other Ambulatory Visit: Payer: Self-pay | Admitting: Internal Medicine

## 2021-06-11 DIAGNOSIS — M4807 Spinal stenosis, lumbosacral region: Secondary | ICD-10-CM

## 2021-06-12 ENCOUNTER — Ambulatory Visit: Payer: Medicare PPO

## 2021-06-20 ENCOUNTER — Other Ambulatory Visit (INDEPENDENT_AMBULATORY_CARE_PROVIDER_SITE_OTHER): Payer: Self-pay | Admitting: Vascular Surgery

## 2021-06-20 DIAGNOSIS — I739 Peripheral vascular disease, unspecified: Secondary | ICD-10-CM

## 2021-06-23 ENCOUNTER — Ambulatory Visit (INDEPENDENT_AMBULATORY_CARE_PROVIDER_SITE_OTHER): Payer: Medicare PPO

## 2021-06-23 ENCOUNTER — Encounter (INDEPENDENT_AMBULATORY_CARE_PROVIDER_SITE_OTHER): Payer: Self-pay | Admitting: Vascular Surgery

## 2021-06-23 ENCOUNTER — Ambulatory Visit (INDEPENDENT_AMBULATORY_CARE_PROVIDER_SITE_OTHER): Payer: Medicare PPO | Admitting: Vascular Surgery

## 2021-06-23 ENCOUNTER — Ambulatory Visit
Admission: RE | Admit: 2021-06-23 | Discharge: 2021-06-23 | Disposition: A | Discharge status: Home / Self Care | Payer: Medicare PPO | Source: Ambulatory Visit | Attending: Internal Medicine | Admitting: Internal Medicine

## 2021-06-23 ENCOUNTER — Other Ambulatory Visit: Payer: Self-pay

## 2021-06-23 VITALS — BP 127/70 | HR 70 | Ht 67.0 in | Wt 228.0 lb

## 2021-06-23 DIAGNOSIS — I25118 Atherosclerotic heart disease of native coronary artery with other forms of angina pectoris: Secondary | ICD-10-CM | POA: Diagnosis not present

## 2021-06-23 DIAGNOSIS — M4807 Spinal stenosis, lumbosacral region: Secondary | ICD-10-CM

## 2021-06-23 DIAGNOSIS — I10 Essential (primary) hypertension: Secondary | ICD-10-CM

## 2021-06-23 DIAGNOSIS — I70213 Atherosclerosis of native arteries of extremities with intermittent claudication, bilateral legs: Secondary | ICD-10-CM

## 2021-06-23 DIAGNOSIS — E782 Mixed hyperlipidemia: Secondary | ICD-10-CM

## 2021-06-23 DIAGNOSIS — I739 Peripheral vascular disease, unspecified: Secondary | ICD-10-CM

## 2021-06-23 NOTE — Progress Notes (Signed)
MRN : [id number]  Juan Watson is a 70 y.o. (Aug 05, 1951) male who presents with chief complaint of  Chief Complaint  Patient presents with   Follow-up    3 Mo [place]  .  History of Present Illness:   The patient returns to the office for followup and review of his atherosclerotic occlusive disease bilateral lower extremities.  He is status post angiogram with intervention on September 24, 2020.   Intervention performed:  Percutaneous transluminal angioplasty and stent placement to the left superficial femoral and popliteal arteries with percutaneous transluminal angioplasty to 3 mm of the left peroneal.  The patient has single-vessel runoff via the peroneal.   The patient notes continued improvement in his lower extremity symptoms. No interval shortening of the patient's claudication distance or rest pain symptoms.  No new ulcers or wounds have occurred since the last visit.   There have been no significant changes to the patient's overall health care.   The patient denies amaurosis fugax or recent TIA symptoms. There are no recent neurological changes noted. The patient denies history of DVT, PE or superficial thrombophlebitis. The patient denies recent episodes of angina or shortness of breath.   ABI's Rt=1.03 and Lt=1.11  (previous ABI's Rt=1.02 and Lt=1.14)   Duplex ultrasound of the left lower extremity shows patent SFA; 60% common femoral stenosis unchanged  Current Meds  Medication Sig   amLODipine (NORVASC) 10 MG tablet Take 1 tablet (10 mg total) by mouth daily.   aspirin EC 81 MG tablet Take 81 mg by mouth at bedtime.   chlorthalidone (HYGROTON) 25 MG tablet Take 25 mg by mouth daily.   cloNIDine (CATAPRES) 0.1 MG tablet TAKE 1 TABLET TWICE DAILY (Patient taking differently: Take 0.1 mg by mouth 2 (two) times daily.)   diclofenac (VOLTAREN) 75 MG EC tablet Take 1 tablet (75 mg total) by mouth 2 (two) times daily as needed.   glucose blood test strip Use as instructed    labetalol (NORMODYNE) 200 MG tablet TAKE 1 TABLET TWICE DAILY   metFORMIN (GLUCOPHAGE) 500 MG tablet Take 2 tablets (1,000 mg total) by mouth 2 (two) times daily with a meal.   omeprazole (PRILOSEC) 20 MG capsule Take 1 capsule (20 mg total) by mouth daily.   rosuvastatin (CRESTOR) 10 MG tablet Take 1 tablet (10 mg total) by mouth daily.   vitamin B-12 (CYANOCOBALAMIN) 1000 MCG tablet Take 1,000 mcg by mouth daily with supper.     Past Medical History:  Diagnosis Date   Acquired flat foot 11/15/2015   Arthritis of knee, degenerative 04/15/2015   BPH (benign prostatic hyperplasia)    Chewing tobacco nicotine dependence without complication 3/84/6659   Cholecystitis 06/28/2017   Diabetes mellitus without complication ([place])    Diabetes mellitus, type 2 ([place]) 12/13/2002   Overview:  DIET CONTROLLED    Disease of nail 07/16/2011   Encounter for screening for malignant neoplasm of prostate 10/29/2014   Essential (primary) hypertension 10/17/2010   Familial aortic aneurysm 03/26/2014   Family history of cardiovascular disease 03/26/2014   Gastro-esophageal reflux disease without esophagitis 08/11/2013   Generalized OA 08/11/2013   Gout 03/26/2014   Heart murmur    HOH (hard of hearing)    aids   Motion sickness    "sea sick"   Primary osteoarthritis of right knee 04/14/2015   Pure hypercholesterolemia 12/13/2002   Skin lesion 03/11/2015    Past Surgical History:  Procedure Laterality Date   BACK SURGERY  77,87  CARDIAC CATHETERIZATION  70's   pericarditis   CATARACT EXTRACTION W/PHACO Right 03/23/2016   Procedure: CATARACT EXTRACTION PHACO AND INTRAOCULAR LENS PLACEMENT (IOC);  Surgeon: Estill Cotta, MD;  Location: [facility];  Service: Ophthalmology;  Laterality: Right;  [place] 01:31    CATARACT EXTRACTION W/PHACO Left 10/23/2019   Procedure: CATARACT EXTRACTION PHACO AND INTRAOCULAR LENS PLACEMENT (IOC) LEFT DIABETIC TORIC LENS 5.82,   00:44.1;  Surgeon: Eulogio Bear, MD;  Location:  [place];  Service: Ophthalmology;  Laterality: Left;  Diabetic - oral meds   CHOLECYSTECTOMY N/A 06/29/2017   Procedure: LAPAROSCOPIC CHOLECYSTECTOMY;  Surgeon: Olean Ree, MD;  Location: [facility];  Service: General;  Laterality: N/A;   COLONOSCOPY WITH PROPOFOL N/A 10/06/2017   Procedure: COLONOSCOPY WITH PROPOFOL;  Surgeon: Manya Silvas, MD;  [facility];  Service: Endoscopy;  Laterality: N/A;   CORONARY STENT INTERVENTION N/A 04/11/2020   Procedure: CORONARY STENT INTERVENTION;  Surgeon: Yolonda Kida, MD;  Location: [place] [facility];  Service: Cardiovascular;  Laterality: N/A;   HERNIA REPAIR  11,   umbilical x2   KNEE ARTHROSCOPY Right 12/20/2017   Procedure: ARTHROSCOPY KNEE EXTENSIVE SYNOVECTOMY AND LYSIS OF ADHESIONS;  Surgeon: Dereck Leep, MD;  Location: [facility];  Service: Orthopedics;  Laterality: Right;   KNEE DEBRIDEMENT Left    tendon   LEFT HEART CATH AND CORONARY ANGIOGRAPHY Left 04/11/2020   Procedure: LEFT HEART CATH AND CORONARY ANGIOGRAPHY;  Surgeon: Corey Skains, MD;  Location: [place] [facility];  Service: Cardiovascular;  Laterality: Left;   LOWER EXTREMITY ANGIOGRAPHY Left 09/24/2020   Procedure: LOWER EXTREMITY ANGIOGRAPHY;  Surgeon: Katha Cabal, MD;  Location: [place] [facility];  Service: Cardiovascular;  Laterality: Left;   TONSILLECTOMY  72   TOTAL KNEE ARTHROPLASTY Right 04/15/2015   Procedure: TOTAL KNEE ARTHROPLASTY;  Surgeon: Frederik Pear, MD;  Location: [place];  Service: Orthopedics;  Laterality: Right;   TRANSURETHRAL RESECTION OF PROSTATE  ?    Social History Social History   Tobacco Use   Smoking status: Former    Packs/day: 2.00    Years: 25.00    Pack years: 50.00    Types: Cigarettes    Quit date: 07/15/1992    Years since quitting: 28.9   Smokeless tobacco: Current    Types: Chew  Vaping Use   Vaping Use: Never used  Substance Use Topics   Alcohol use: No   Drug use: No    Family  History Family History  Problem Relation Age of Onset   Multiple myeloma Mother    Aneurysm Father    Aneurysm Brother    Nephrolithiasis Neg Hx    Bladder Cancer Neg Hx    Prostate cancer Neg Hx    Kidney cancer Neg Hx     Allergies  Allergen Reactions   Bee Venom Shortness Of Breath and Swelling   Sulfa Antibiotics Swelling    Tongue swelling   Ben Gay Ultra [Menthol] Other (See Comments)    Whelts and redness of skin     REVIEW OF SYSTEMS (Negative unless checked)  Constitutional: _0 Weight loss  _1 Fever  _2 Chills Cardiac: _3 Chest pain   _4 Chest pressure   _5 Palpitations   _6 Shortness of breath when laying flat   _7 Shortness of breath with exertion. Vascular:  _8 Pain in legs with walking   _9 Pain in legs at rest  _10 History of DVT   _11 Phlebitis   _12 Swelling in legs   _13 Varicose veins   _14 Non-healing ulcers Pulmonary:   _15   Uses home oxygen   _0 Productive cough   _1 Hemoptysis   _2 Wheeze  _3 COPD   _4 Asthma Neurologic:  _5 Dizziness   _6 Seizures   _7 History of stroke   _8 History of TIA  _9 Aphasia   _10 Vissual changes   _11 Weakness or numbness in arm   _12 Weakness or numbness in leg Musculoskeletal:   _13 Joint swelling   _14 Joint pain   _15 Low back pain Hematologic:  _16 Easy bruising  _17 Easy bleeding   _18 Hypercoagulable state   _19 Anemic Gastrointestinal:  _20 Diarrhea   _21 Vomiting  _22 Gastroesophageal reflux/heartburn   _23 Difficulty swallowing. Genitourinary:  _24 Chronic kidney disease   _25 Difficult urination  _26 Frequent urination   _27 Blood in urine Skin:  _28 Rashes   _29 Ulcers  Psychological:  _30 History of anxiety   _31  History of major depression.  Physical Examination  Vitals:   06/23/21 0843  BP: 127/70  Pulse: 70  Weight: 228 lb (103.4 kg)  Height: _32  (1.702 m)   Body mass index is 35.71 kg/m. Gen: WD/WN, NAD Head: [place]/AT, No temporalis wasting.  Ear/Nose/Throat: Hearing grossly intact, nares w/o erythema or drainage Eyes: PER, EOMI, sclera nonicteric.  Neck: Supple, no large  masses.   Pulmonary:  Good air movement, no audible wheezing bilaterally, no use of accessory muscles.  Cardiac: RRR, no JVD Vascular:  Vessel Right Left  Radial Palpable Palpable  PT Trace Palpable Trace Palpable  DP Trace Palpable Trace Palpable  Gastrointestinal: Non-distended. No guarding/no peritoneal signs.  Musculoskeletal: M/S 5/5 throughout.  No deformity or atrophy.  Neurologic: CN 2-12 intact. Symmetrical.  Speech is fluent. Motor exam as listed above. Psychiatric: Judgment intact, Mood & affect appropriate for pt's clinical situation. Dermatologic: No rashes or ulcers noted.  No changes consistent with cellulitis. Lymph : No lichenification or skin changes of chronic lymphedema.  CBC Lab Results  Component Value Date   WBC 7.6 12/02/2020   HGB 13.6 12/02/2020   HCT 40.2 12/02/2020   MCV 81.4 12/02/2020   PLT 214.0 12/02/2020    BMET    Component Value Date/Time   NA 139 12/02/2020 0812   K 4.3 12/02/2020 0812   CL 101 12/02/2020 0812   CO2 28 12/02/2020 0812   GLUCOSE 175 (H) 12/02/2020 0812   BUN 20 12/02/2020 0812   CREATININE 1.11 12/02/2020 0812   CALCIUM 9.8 12/02/2020 0812   GFRNONAA >60 09/24/2020 0733   GFRAA >60 02/13/2020 1809   CrCl cannot be calculated (Patient's most recent lab result is older than the maximum 21 days allowed.).  COAG Lab Results  Component Value Date   INR 0.96 04/05/2015    Radiology No results found.   Assessment/Plan 1. Atherosclerosis of native artery of both lower extremities with intermittent claudication (HCC) Recommend:   The patient is status post successful angiogram with intervention.  The patient reports that the claudication symptoms and leg pain is essentially gone.   The patient denies lifestyle limiting changes at this point in time.   No further invasive studies, angiography or surgery at this time The patient should continue walking and begin a more formal exercise program. The patient should  continue antiplatelet therapy and aggressive treatment of the lipid abnormalities.   The patient should continue wearing graduated compression socks 10-15 mmHg strength to control the mild edema.   Patient should undergo noninvasive studies as ordered. The patient will follow up with me after the studies.  - VAS [place] ABI WITH/WO TBI; Future  2. Coronary artery disease of native artery of native heart with stable angina pectoris ([place])  Continue cardiac and antihypertensive medications as already ordered and reviewed, no changes at this time.  Continue statin as ordered and reviewed, no changes at this time  Nitrates PRN for chest pain   3. Benign essential HTN Continue antihypertensive medications as already ordered, these medications have been reviewed and there are no changes at this time.   4. Mixed hyperlipidemia Continue statin as ordered and reviewed, no changes at this time     Hortencia Pilar, MD  06/23/2021 8:52 AM

## 2021-09-04 ENCOUNTER — Other Ambulatory Visit: Payer: Self-pay | Admitting: Family Medicine

## 2021-09-04 DIAGNOSIS — E785 Hyperlipidemia, unspecified: Secondary | ICD-10-CM

## 2021-09-04 NOTE — Telephone Encounter (Signed)
Can you refuse? I called and spoke with patient's wife & let her know that patient needed to contact Dr. Rondel Baton office to request refill sine he now follows with him.

## 2021-09-04 NOTE — Telephone Encounter (Signed)
NVM I can refuse this one. So sorry!

## 2021-10-07 ENCOUNTER — Other Ambulatory Visit: Payer: Self-pay | Admitting: Family

## 2021-11-05 ENCOUNTER — Other Ambulatory Visit: Payer: Self-pay | Admitting: Family

## 2021-12-22 NOTE — Progress Notes (Deleted)
MRN : [id number]  Juan Watson is a 71 y.o. (1951-10-13) male who presents with chief complaint of check circulation.  History of Present Illness:   The patient returns to the office for followup and review of his atherosclerotic occlusive disease bilateral lower extremities.  He is status post angiogram with intervention on September 24, 2020.   Intervention performed:  Percutaneous transluminal angioplasty and stent placement to the left superficial femoral and popliteal arteries with percutaneous transluminal angioplasty to 3 mm of the left peroneal.  The patient has single-vessel runoff via the peroneal.   The patient notes continued improvement in his lower extremity symptoms. No interval shortening of the patient's claudication distance or rest pain symptoms.  No new ulcers or wounds have occurred since the last visit.   There have been no significant changes to the patient's overall health care.   The patient denies amaurosis fugax or recent TIA symptoms. There are no recent neurological changes noted. The patient denies history of DVT, PE or superficial thrombophlebitis. The patient denies recent episodes of angina or shortness of breath.   ABI's Rt=1.03 and Lt=1.11  (previous ABI's Rt=1.02 and Lt=1.14)   Duplex ultrasound of the left lower extremity shows patent SFA; 60% common femoral stenosis unchanged  No outpatient medications have been marked as taking for the 12/25/21 encounter (Appointment) with Delana Meyer, Dolores Lory, MD.    Past Medical History:  Diagnosis Date   Acquired flat foot 11/15/2015   Arthritis of knee, degenerative 04/15/2015   BPH (benign prostatic hyperplasia)    Chewing tobacco nicotine dependence without complication 0/96/4383   Cholecystitis 06/28/2017   Diabetes mellitus without complication ([place])    Diabetes mellitus, type 2 ([place]) 12/13/2002   Overview:  DIET CONTROLLED    Disease of nail 07/16/2011   Encounter for screening for malignant neoplasm  of prostate 10/29/2014   Essential (primary) hypertension 10/17/2010   Familial aortic aneurysm 03/26/2014   Family history of cardiovascular disease 03/26/2014   Gastro-esophageal reflux disease without esophagitis 08/11/2013   Generalized OA 08/11/2013   Gout 03/26/2014   Heart murmur    HOH (hard of hearing)    aids   Motion sickness    "sea sick"   Primary osteoarthritis of right knee 04/14/2015   Pure hypercholesterolemia 12/13/2002   Skin lesion 03/11/2015    Past Surgical History:  Procedure Laterality Date   BACK SURGERY  77,87   CARDIAC CATHETERIZATION  70's   pericarditis   CATARACT EXTRACTION W/PHACO Right 03/23/2016   Procedure: CATARACT EXTRACTION PHACO AND INTRAOCULAR LENS PLACEMENT ([place]);  Surgeon: Estill Cotta, MD;  Location: [facility];  Service: Ophthalmology;  Laterality: Right;  [place] 01:31    CATARACT EXTRACTION W/PHACO Left 10/23/2019   Procedure: CATARACT EXTRACTION PHACO AND INTRAOCULAR LENS PLACEMENT ([facility]) LEFT DIABETIC TORIC LENS 5.82,   00:44.1;  Surgeon: Eulogio Bear, MD;  Location: [place];  Service: Ophthalmology;  Laterality: Left;  Diabetic - oral meds   CHOLECYSTECTOMY N/A 06/29/2017   Procedure: LAPAROSCOPIC CHOLECYSTECTOMY;  Surgeon: Olean Ree, MD;  Location: [facility];  Service: General;  Laterality: N/A;   COLONOSCOPY WITH PROPOFOL N/A 10/06/2017   Procedure: COLONOSCOPY WITH PROPOFOL;  Surgeon: Manya Silvas, MD;  Location: [facility];  Service: Endoscopy;  Laterality: N/A;   CORONARY STENT INTERVENTION N/A 04/11/2020   Procedure: CORONARY STENT INTERVENTION;  Surgeon: Yolonda Kida, MD;  Location: [place] [facility];  Service: Cardiovascular;  Laterality: N/A;   HERNIA REPAIR  11,  umbilical x2   KNEE ARTHROSCOPY Right 12/20/2017   Procedure: ARTHROSCOPY KNEE EXTENSIVE SYNOVECTOMY AND LYSIS OF ADHESIONS;  Surgeon: Dereck Leep, MD;  Location: [facility];  Service: Orthopedics;  Laterality: Right;   KNEE DEBRIDEMENT  Left    tendon   LEFT HEART CATH AND CORONARY ANGIOGRAPHY Left 04/11/2020   Procedure: LEFT HEART CATH AND CORONARY ANGIOGRAPHY;  Surgeon: Corey Skains, MD;  Location: [place] [facility];  Service: Cardiovascular;  Laterality: Left;   LOWER EXTREMITY ANGIOGRAPHY Left 09/24/2020   Procedure: LOWER EXTREMITY ANGIOGRAPHY;  Surgeon: Katha Cabal, MD;  Location: [place] [facility];  Service: Cardiovascular;  Laterality: Left;   TONSILLECTOMY  72   TOTAL KNEE ARTHROPLASTY Right 04/15/2015   Procedure: TOTAL KNEE ARTHROPLASTY;  Surgeon: Frederik Pear, MD;  Location: [place];  Service: Orthopedics;  Laterality: Right;   TRANSURETHRAL RESECTION OF PROSTATE  ?    Social History Social History   Tobacco Use   Smoking status: Former    Packs/day: 2.00    Years: 25.00    Pack years: 50.00    Types: Cigarettes    Quit date: 07/15/1992    Years since quitting: 29.4   Smokeless tobacco: Current    Types: Chew  Vaping Use   Vaping Use: Never used  Substance Use Topics   Alcohol use: No   Drug use: No    Family History Family History  Problem Relation Age of Onset   Multiple myeloma Mother    Aneurysm Father    Aneurysm Brother    Nephrolithiasis Neg Hx    Bladder Cancer Neg Hx    Prostate cancer Neg Hx    Kidney cancer Neg Hx     Allergies  Allergen Reactions   Bee Venom Shortness Of Breath and Swelling   Sulfa Antibiotics Swelling    Tongue swelling   Ben Gay Ultra [Menthol] Other (See Comments)    Whelts and redness of skin     REVIEW OF SYSTEMS (Negative unless checked)  Constitutional: [] Weight loss  [] Fever  [] Chills Cardiac: [] Chest pain   [] Chest pressure   [] Palpitations   [] Shortness of breath when laying flat   [] Shortness of breath with exertion. Vascular:  [] Pain in legs with walking   [] Pain in legs at rest  [] History of DVT   [] Phlebitis   [] Swelling in legs   [] Varicose veins   [] Non-healing ulcers Pulmonary:   [] Uses home oxygen   [] Productive cough    [] Hemoptysis   [] Wheeze  [] COPD   [] Asthma Neurologic:  [] Dizziness   [] Seizures   [] History of stroke   [] History of TIA  [] Aphasia   [] Vissual changes   [] Weakness or numbness in arm   [] Weakness or numbness in leg Musculoskeletal:   [] Joint swelling   [] Joint pain   [] Low back pain Hematologic:  [] Easy bruising  [] Easy bleeding   [] Hypercoagulable state   [] Anemic Gastrointestinal:  [] Diarrhea   [] Vomiting  [] Gastroesophageal reflux/heartburn   [] Difficulty swallowing. Genitourinary:  [] Chronic kidney disease   [] Difficult urination  [] Frequent urination   [] Blood in urine Skin:  [] Rashes   [] Ulcers  Psychological:  [] History of anxiety   []  History of major depression.  Physical Examination  There were no vitals filed for this visit. There is no height or weight on file to calculate BMI. Gen: WD/WN, NAD Head: [place]/AT, No temporalis wasting.  Ear/Nose/Throat: Hearing grossly intact, nares w/o erythema or drainage Eyes: PER, EOMI, sclera nonicteric.  Neck: Supple, no masses.  No bruit or JVD.  Pulmonary:  Good air movement, no audible wheezing, no use of accessory muscles.  Cardiac: RRR, normal S1, S2, no Murmurs. Vascular:  *** Vessel Right Left  Radial Palpable Palpable  Carotid Palpable Palpable  PT Palpable Palpable  DP Palpable Palpable  Gastrointestinal: soft, non-distended. No guarding/no peritoneal signs.  Musculoskeletal: M/S 5/5 throughout.  No visible deformity.  Neurologic: CN 2-12 intact. Pain and light touch intact in extremities.  Symmetrical.  Speech is fluent. Motor exam as listed above. Psychiatric: Judgment intact, Mood & affect appropriate for pt's clinical situation. Dermatologic: No rashes or ulcers noted.  No changes consistent with cellulitis.   CBC Lab Results  Component Value Date   WBC 7.6 12/02/2020   HGB 13.6 12/02/2020   HCT 40.2 12/02/2020   MCV 81.4 12/02/2020   PLT 214.0 12/02/2020    BMET    Component Value Date/Time   NA 139 12/02/2020  0812   K 4.3 12/02/2020 0812   CL 101 12/02/2020 0812   CO2 28 12/02/2020 0812   GLUCOSE 175 (H) 12/02/2020 0812   BUN 20 12/02/2020 0812   CREATININE 1.11 12/02/2020 0812   CALCIUM 9.8 12/02/2020 0812   GFRNONAA >60 09/24/2020 0733   GFRAA >60 02/13/2020 1809   CrCl cannot be calculated (Patient's most recent lab result is older than the maximum 21 days allowed.).  COAG Lab Results  Component Value Date   INR 0.96 04/05/2015    Radiology No results found.   Assessment/Plan There are no diagnoses linked to this encounter.   Hortencia Pilar, MD  12/22/2021 11:28 AM

## 2021-12-25 ENCOUNTER — Ambulatory Visit (INDEPENDENT_AMBULATORY_CARE_PROVIDER_SITE_OTHER): Payer: Medicare PPO | Admitting: Vascular Surgery

## 2021-12-25 ENCOUNTER — Encounter (INDEPENDENT_AMBULATORY_CARE_PROVIDER_SITE_OTHER): Payer: Medicare PPO

## 2021-12-25 ENCOUNTER — Encounter (INDEPENDENT_AMBULATORY_CARE_PROVIDER_SITE_OTHER): Payer: Self-pay

## 2022-09-28 ENCOUNTER — Encounter (INDEPENDENT_AMBULATORY_CARE_PROVIDER_SITE_OTHER): Payer: Self-pay

## 2023-02-03 DIAGNOSIS — I350 Nonrheumatic aortic (valve) stenosis: Secondary | ICD-10-CM

## 2023-02-03 HISTORY — DX: Nonrheumatic aortic (valve) stenosis: I35.0

## 2023-06-16 ENCOUNTER — Other Ambulatory Visit (INDEPENDENT_AMBULATORY_CARE_PROVIDER_SITE_OTHER): Payer: Self-pay | Admitting: Vascular Surgery

## 2023-06-16 ENCOUNTER — Ambulatory Visit (INDEPENDENT_AMBULATORY_CARE_PROVIDER_SITE_OTHER): Payer: Medicare PPO

## 2023-06-16 DIAGNOSIS — I739 Peripheral vascular disease, unspecified: Secondary | ICD-10-CM

## 2023-06-20 IMAGING — MR MR LUMBAR SPINE W/O CM
5 series · 31 of 48 positions shown · non-contrast
Comparison: None.

CLINICAL DATA: Bilateral leg pain and weakness.  Stumbling.

EXAM:
MRI LUMBAR SPINE WITHOUT CONTRAST
TECHNIQUE: Multiplanar, multisequence MR imaging of the lumbar spine was
performed. No intravenous contrast was administered.

[Series 5: T2 · sagittal · 4.0mm · 0.81mm/px · 6 of 17 slices shown (1 of 2)]
[im 1/17]
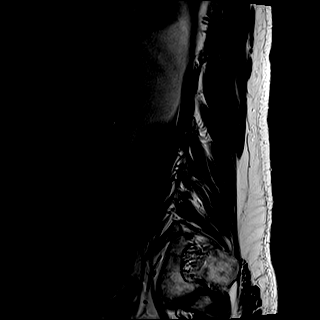
[im 4/17]
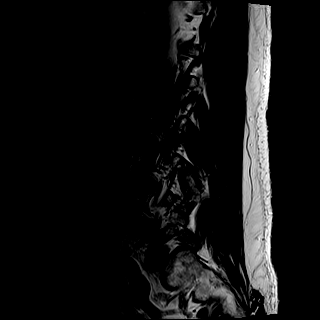
[im 7/17]
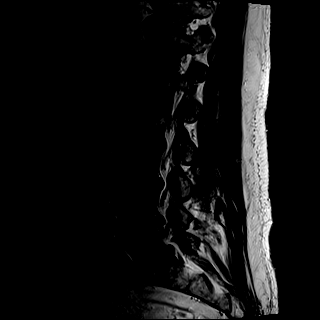
[im 10/17]
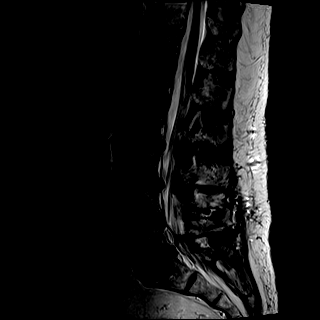
[im 13/17]
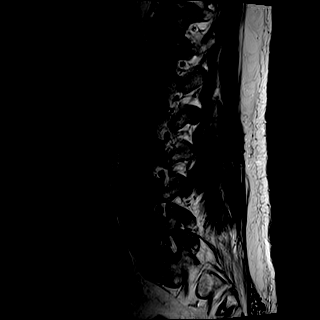
[im 17/17]
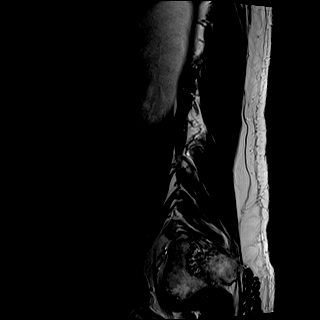

[Series 6: T1 · sagittal · 4.0mm · 0.81mm/px · 7 of 17 slices shown (1 of 2)]
[im 1/17]
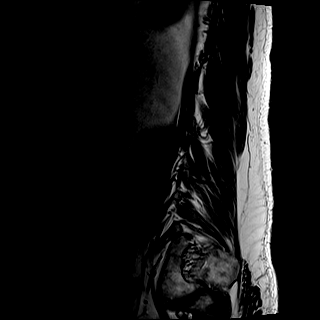
[im 3/17]
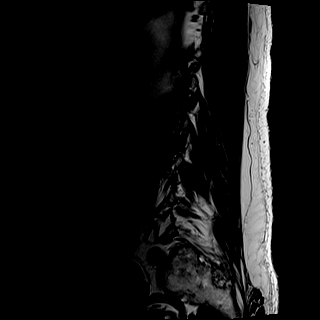
[im 6/17]
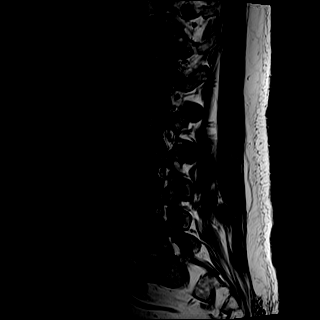
[im 9/17]
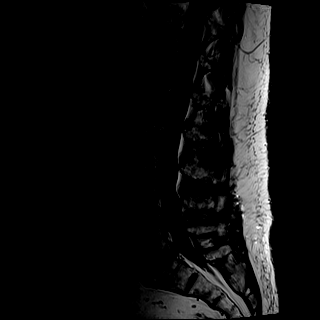
[im 11/17]
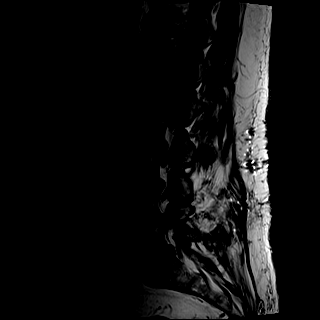
[im 14/17]
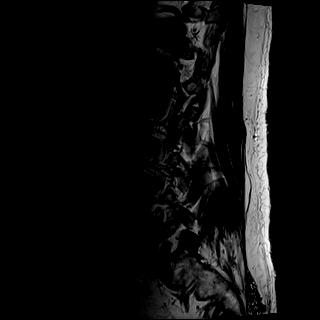
[im 17/17]
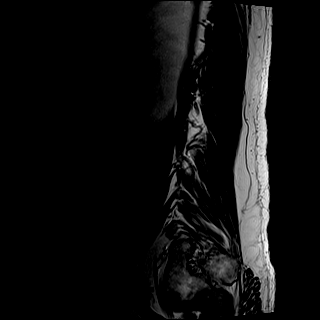

[Series 7: STIR · sagittal · 4.0mm · 0.41mm/px · 2 of 17 slices shown]
[im 1/17]
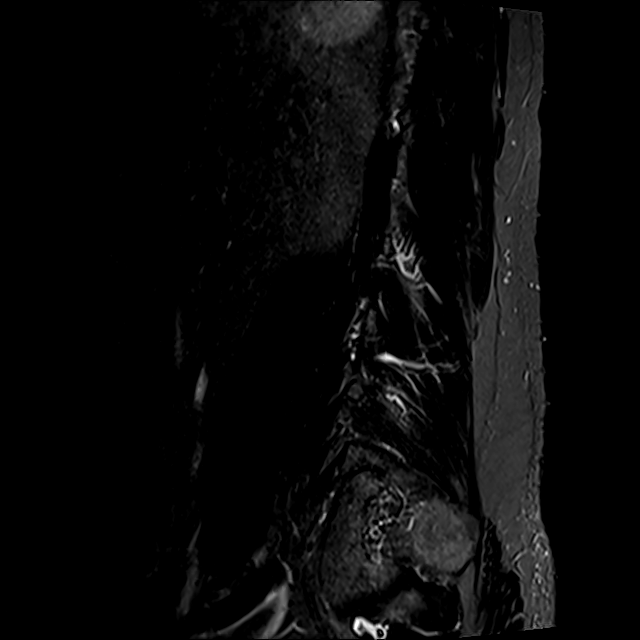
[im 3/17]
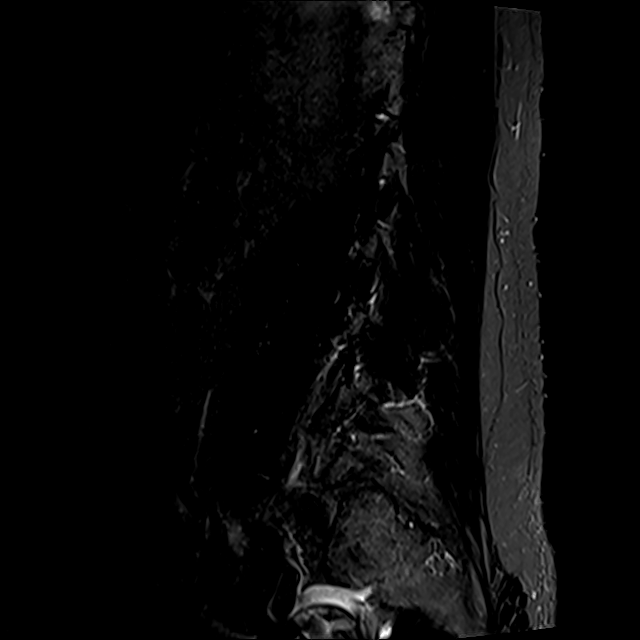

[Series 8: T2 · axial · 4.0mm · 0.78mm/px · z∈[-125,+91]mm · 8 of 36 slices shown (2 of 2)]
[im 1/36]
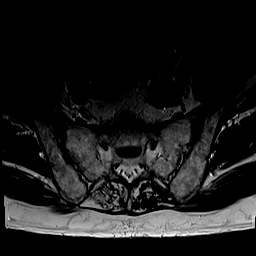
[im 6/36]
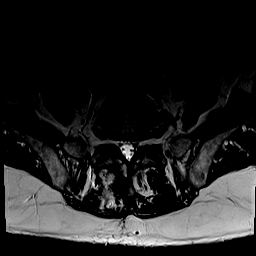
[im 11/36]
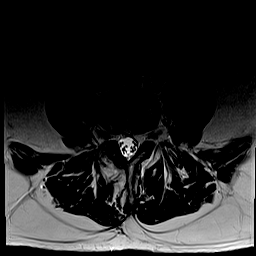
[im 17/36]
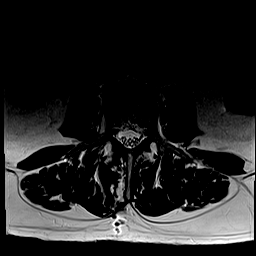
[im 19/36]
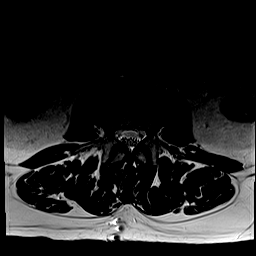
[im 25/36]
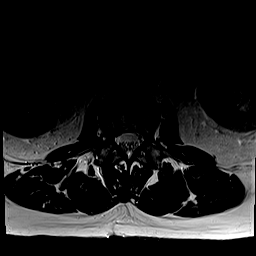
[im 30/36]
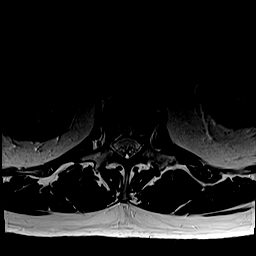
[im 36/36]
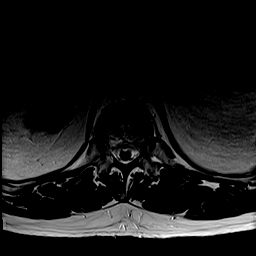

[Series 9: T1 · axial · 4.0mm · 0.39mm/px · z∈[-125,+91]mm · 8 of 36 slices shown (2 of 2)]
[im 1/36]
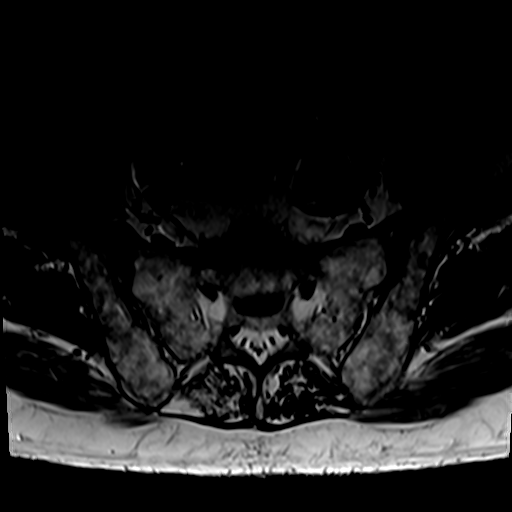
[im 6/36]
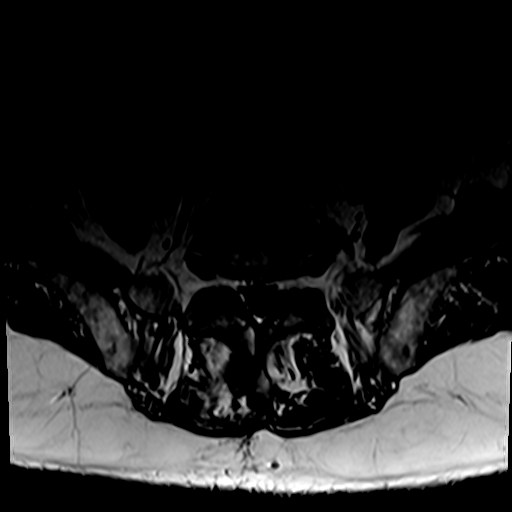
[im 11/36]
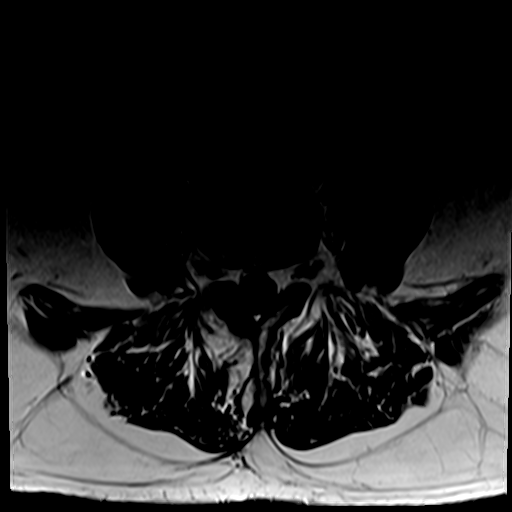
[im 17/36]
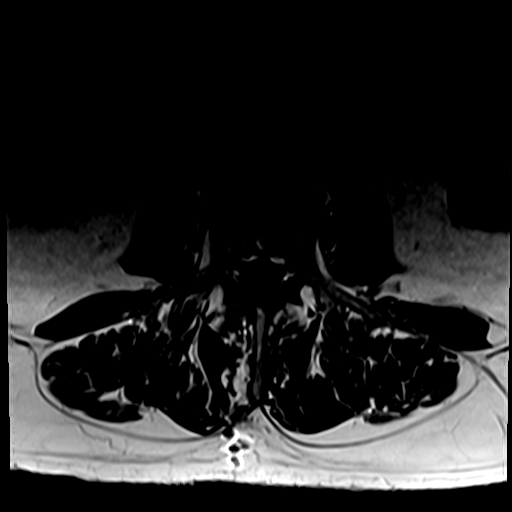
[im 19/36]
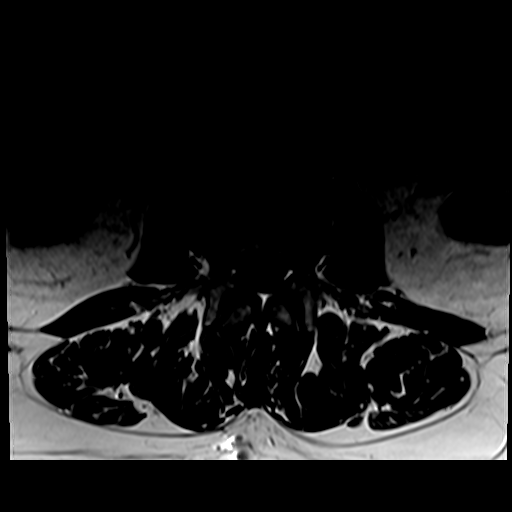
[im 25/36]
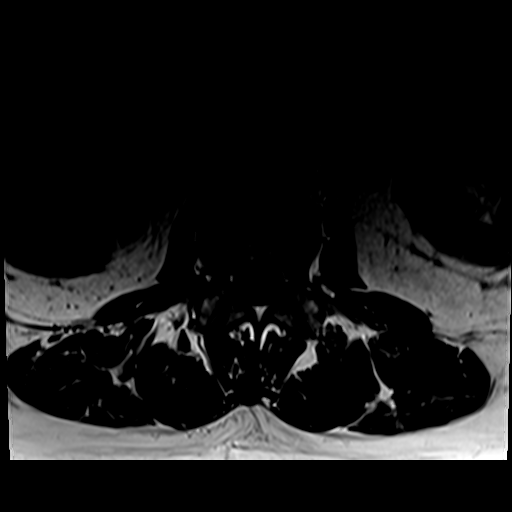
[im 30/36]
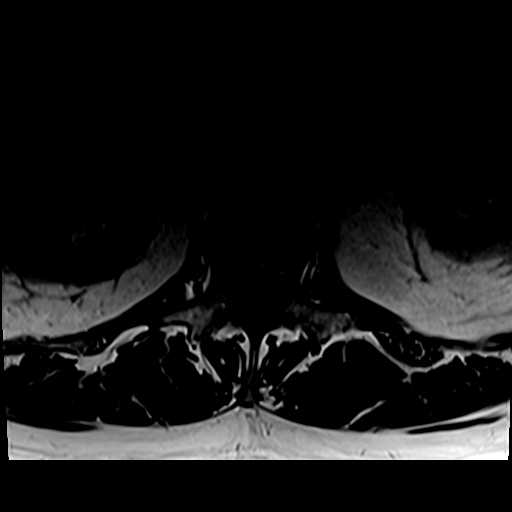
[im 36/36]
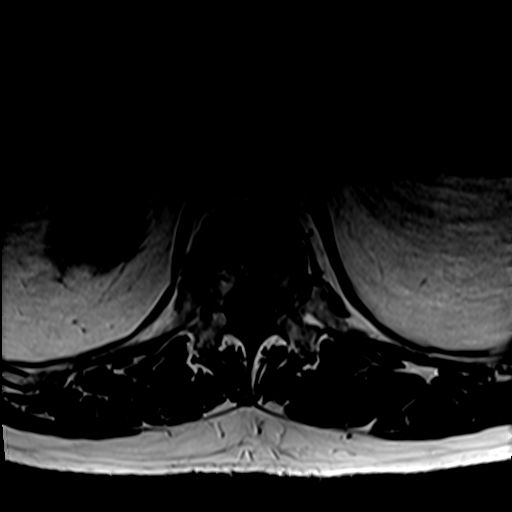

[31 of 48 positions shown; findings below may reference images not displayed]

FINDINGS: Segmentation: 5 non rib-bearing lumbar type vertebral bodies are
present. The lowest fully formed vertebral body is L5.

Alignment: No significant listhesis is present. Lumbar lordosis
preserved.

Vertebrae:  Marrow signal and vertebral body heights are normal.

Conus medullaris and cauda equina: Conus extends to the T12-L1
level. Conus and cauda equina appear normal.

Paraspinal and other soft tissues: Limited imaging the abdomen is
unremarkable. There is no significant adenopathy. No solid organ
lesions are present.

Disc levels:

L1-2: Mild disc desiccation present without significant protrusion
or stenosis.

L1-2: Normal.

L2-3: Mild rightward disc protrusion present without significant
stenosis.

L3-4: Mild broad-based disc protrusion is present. Right laminectomy
noted. Posterior canal decompressed. Mild foraminal narrowing is
worse right than left.

L4-5: Right laminectomy present. Central canal is decompressed. Mild
foraminal narrowing is worse right than left.

L5-S1: A shallow central disc protrusion is present. No significant
stenosis is present.
IMPRESSION: 1. Right laminectomy at L3-4 and L4-5 with decompression of the
central canal.
2. Mild foraminal narrowing bilaterally at L3-4 and L4-5 is worse on
the right.
3. Shallow central disc protrusion at L5-S1 without significant
stenosis.

## 2023-06-21 ENCOUNTER — Encounter (INDEPENDENT_AMBULATORY_CARE_PROVIDER_SITE_OTHER): Payer: Self-pay | Admitting: Vascular Surgery

## 2023-06-21 ENCOUNTER — Ambulatory Visit (INDEPENDENT_AMBULATORY_CARE_PROVIDER_SITE_OTHER): Payer: Medicare PPO | Admitting: Vascular Surgery

## 2023-06-21 VITALS — BP 119/72 | HR 67 | Resp 18 | Ht 67.0 in | Wt 228.0 lb

## 2023-06-21 DIAGNOSIS — E1159 Type 2 diabetes mellitus with other circulatory complications: Secondary | ICD-10-CM

## 2023-06-21 DIAGNOSIS — I25118 Atherosclerotic heart disease of native coronary artery with other forms of angina pectoris: Secondary | ICD-10-CM | POA: Diagnosis not present

## 2023-06-21 DIAGNOSIS — I70223 Atherosclerosis of native arteries of extremities with rest pain, bilateral legs: Secondary | ICD-10-CM | POA: Diagnosis not present

## 2023-06-21 DIAGNOSIS — E782 Mixed hyperlipidemia: Secondary | ICD-10-CM

## 2023-06-21 DIAGNOSIS — I10 Essential (primary) hypertension: Secondary | ICD-10-CM

## 2023-06-21 DIAGNOSIS — I70229 Atherosclerosis of native arteries of extremities with rest pain, unspecified extremity: Secondary | ICD-10-CM | POA: Insufficient documentation

## 2023-06-21 LAB — VAS US ABI WITH/WO TBI
Left ABI: 0.87
Right ABI: 0.53

## 2023-06-21 NOTE — Progress Notes (Signed)
MRN : [id number]  Juan Watson is a 72 y.o. (20-Feb-1951) male who presents with chief complaint of check circulation.  History of Present Illness:   The patient returns to the office for followup and review of the noninvasive studies.   The patient notes that there has been a significant deterioration in the lower extremity symptoms.  The patient notes interval shortening of their claudication distance and development of significant rest pain symptoms. No new ulcers or wounds have occurred since the last visit.  There have been no significant changes to the patient's overall health care.  The patient denies amaurosis fugax or recent TIA symptoms. There are no recent neurological changes noted. There is no history of DVT, PE or superficial thrombophlebitis. The patient denies recent episodes of angina or shortness of breath.   ABI's Rt=0.53 and Lt=0.87 (previous ABI's Rt=1.03 and Lt=1.11)    Current Meds  Medication Sig   acetaminophen (TYLENOL) 500 MG tablet Take 1,000 mg by mouth every 6 (six) hours as needed for mild pain or headache.   amLODipine (NORVASC) 10 MG tablet Take 1 tablet (10 mg total) by mouth daily.   aspirin EC 81 MG tablet Take 81 mg by mouth at bedtime.   chlorthalidone (HYGROTON) 25 MG tablet Take 25 mg by mouth daily.   cloNIDine (CATAPRES) 0.1 MG tablet TAKE 1 TABLET TWICE DAILY (Patient taking differently: Take 0.1 mg by mouth daily.)   diclofenac (VOLTAREN) 75 MG EC tablet Take 1 tablet (75 mg total) by mouth 2 (two) times daily as needed.   Febuxostat 80 MG TABS Take by mouth.   glucose blood test strip Use as instructed   labetalol (NORMODYNE) 200 MG tablet TAKE 1 TABLET TWICE DAILY   metFORMIN (GLUCOPHAGE) 500 MG tablet Take 2 tablets (1,000 mg total) by mouth 2 (two) times daily with a meal.   MOUNJARO 7.5 MG/0.5ML Pen Inject into the skin.   pantoprazole (PROTONIX) 40 MG tablet Take  40 mg by mouth daily.   rosuvastatin (CRESTOR) 10 MG tablet Take 1 tablet (10 mg total) by mouth daily.    Past Medical History:  Diagnosis Date   Acquired flat foot 11/15/2015   Arthritis of knee, degenerative 04/15/2015   BPH (benign prostatic hyperplasia)    Chewing tobacco nicotine dependence without complication 08/10/2020   Cholecystitis 06/28/2017   Diabetes mellitus without complication (HCC)    Diabetes mellitus, type 2 (HCC) 12/13/2002   Overview:  DIET CONTROLLED    Disease of nail 07/16/2011   Encounter for screening for malignant neoplasm of prostate 10/29/2014   Essential (primary) hypertension 10/17/2010   Familial aortic aneurysm 03/26/2014   Family history of cardiovascular disease 03/26/2014   Gastro-esophageal reflux disease without esophagitis 08/11/2013   Generalized OA 08/11/2013   Gout 03/26/2014   Heart murmur    HOH (hard of hearing)    aids   Motion sickness    "sea sick"   Primary osteoarthritis of right knee 04/14/2015   Pure hypercholesterolemia 12/13/2002   Skin lesion 03/11/2015    Past Surgical History:  Procedure Laterality Date   BACK SURGERY  77,87   CARDIAC CATHETERIZATION  70's   pericarditis   CATARACT EXTRACTION W/PHACO Right 03/23/2016   Procedure: CATARACT EXTRACTION PHACO AND  INTRAOCULAR LENS PLACEMENT ([facility]);  Surgeon: Sallee Lange, MD;  Location: [facility];  Service: Ophthalmology;  Laterality: Right;  [place] 01:31    CATARACT EXTRACTION W/PHACO Left 10/23/2019   Procedure: CATARACT EXTRACTION PHACO AND INTRAOCULAR LENS PLACEMENT ([facility]) LEFT DIABETIC TORIC LENS 5.82,   00:44.1;  Surgeon: Nevada Crane, MD;  Location: [facility];  Service: Ophthalmology;  Laterality: Left;  Diabetic - oral meds   CHOLECYSTECTOMY N/A 06/29/2017   Procedure: LAPAROSCOPIC CHOLECYSTECTOMY;  Surgeon: Henrene Dodge, MD;  Location: [facility];  Service: General;  Laterality: N/A;   COLONOSCOPY WITH PROPOFOL N/A 10/06/2017   Procedure: COLONOSCOPY WITH  PROPOFOL;  Surgeon: Scot Jun, MD;  Location: [facility];  Service: Endoscopy;  Laterality: N/A;   CORONARY STENT INTERVENTION N/A 04/11/2020   Procedure: CORONARY STENT INTERVENTION;  Surgeon: Alwyn Pea, MD;  Location: [facility];  Service: Cardiovascular;  Laterality: N/A;   HERNIA REPAIR  11,   umbilical x2   KNEE ARTHROSCOPY Right 12/20/2017   Procedure: ARTHROSCOPY KNEE EXTENSIVE SYNOVECTOMY AND LYSIS OF ADHESIONS;  Surgeon: Donato Heinz, MD;  Location: [facility];  Service: Orthopedics;  Laterality: Right;   KNEE DEBRIDEMENT Left    tendon   LEFT HEART CATH AND CORONARY ANGIOGRAPHY Left 04/11/2020   Procedure: LEFT HEART CATH AND CORONARY ANGIOGRAPHY;  Surgeon: Lamar Blinks, MD;  Location: [facility];  Service: Cardiovascular;  Laterality: Left;   LOWER EXTREMITY ANGIOGRAPHY Left 09/24/2020   Procedure: LOWER EXTREMITY ANGIOGRAPHY;  Surgeon: Renford Dills, MD;  Location: [facility];  Service: Cardiovascular;  Laterality: Left;   TONSILLECTOMY  72   TOTAL KNEE ARTHROPLASTY Right 04/15/2015   Procedure: TOTAL KNEE ARTHROPLASTY;  Surgeon: Gean Birchwood, MD;  Location: [facility];  Service: Orthopedics;  Laterality: Right;   TRANSURETHRAL RESECTION OF PROSTATE  ?    Social History Social History   Tobacco Use   Smoking status: Former    Current packs/day: 0.00    Average packs/day: 2.0 packs/day for 25.0 years (50.0 ttl pk-yrs)    Types: Cigarettes    Start date: 07/16/1967    Quit date: 07/15/1992    Years since quitting: 30.9   Smokeless tobacco: Current    Types: Chew  Vaping Use   Vaping status: Never Used  Substance Use Topics   Alcohol use: No   Drug use: No    Family History Family History  Problem Relation Age of Onset   Multiple myeloma Mother    Aneurysm Father    Aneurysm Brother    Nephrolithiasis Neg Hx    Bladder Cancer Neg Hx    Prostate cancer Neg Hx    Kidney cancer Neg Hx     Allergies  Allergen  Reactions   Bee Venom Shortness Of Breath and Swelling   Sulfa Antibiotics Swelling    Tongue swelling   Ben Gay Ultra [Menthol] Other (See Comments)    Whelts and redness of skin     REVIEW OF SYSTEMS (Negative unless checked)  Constitutional: [] Weight loss  [] Fever  [] Chills Cardiac: [] Chest pain   [] Chest pressure   [] Palpitations   [] Shortness of breath when laying flat   [] Shortness of breath with exertion. Vascular:  [x] Pain in legs with walking   [] Pain in legs at rest  [] History of DVT   [] Phlebitis   [] Swelling in legs   [] Varicose veins   [] Non-healing ulcers Pulmonary:   [] Uses home oxygen   [] Productive cough   [] Hemoptysis   []   Wheeze  [] COPD   [] Asthma Neurologic:  [] Dizziness   [] Seizures   [] History of stroke   [] History of TIA  [] Aphasia   [] Vissual changes   [] Weakness or numbness in arm   [] Weakness or numbness in leg Musculoskeletal:   [] Joint swelling   [] Joint pain   [] Low back pain Hematologic:  [] Easy bruising  [] Easy bleeding   [] Hypercoagulable state   [] Anemic Gastrointestinal:  [] Diarrhea   [] Vomiting  [] Gastroesophageal reflux/heartburn   [] Difficulty swallowing. Genitourinary:  [] Chronic kidney disease   [] Difficult urination  [] Frequent urination   [] Blood in urine Skin:  [] Rashes   [] Ulcers  Psychological:  [] History of anxiety   []  History of major depression.  Physical Examination  Vitals:   06/21/23 1027  BP: 119/72  Pulse: 67  Resp: 18  Weight: 228 lb (103.4 kg)  Height: 5\' 7"  (1.702 m)   Body mass index is 35.71 kg/m. Gen: WD/WN, NAD Head: [place]/AT, No temporalis wasting.  Ear/Nose/Throat: Hearing grossly intact, nares w/o erythema or drainage Eyes: PER, EOMI, sclera nonicteric.  Neck: Supple, no masses.  No bruit or JVD.  Pulmonary:  Good air movement, no audible wheezing, no use of accessory muscles.  Cardiac: RRR, normal S1, S2, no Murmurs. Vascular:  mild trophic changes, no open wounds Vessel Right Left  Radial Palpable Palpable  PT  Not Palpable Not Palpable  DP Not Palpable Not Palpable  Gastrointestinal: soft, non-distended. No guarding/no peritoneal signs.  Musculoskeletal: M/S 5/5 throughout.  No visible deformity.  Neurologic: CN 2-12 intact. Pain and light touch intact in extremities.  Symmetrical.  Speech is fluent. Motor exam as listed above. Psychiatric: Judgment intact, Mood & affect appropriate for pt's clinical situation. Dermatologic: No rashes or ulcers noted.  No changes consistent with cellulitis.   CBC Lab Results  Component Value Date   WBC 7.6 12/02/2020   HGB 13.6 12/02/2020   HCT 40.2 12/02/2020   MCV 81.4 12/02/2020   PLT 214.0 12/02/2020    BMET    Component Value Date/Time   NA 139 12/02/2020 0812   K 4.3 12/02/2020 0812   CL 101 12/02/2020 0812   CO2 28 12/02/2020 0812   GLUCOSE 175 (H) 12/02/2020 0812   BUN 20 12/02/2020 0812   CREATININE 1.11 12/02/2020 0812   CALCIUM 9.8 12/02/2020 0812   GFRNONAA >60 09/24/2020 0733   GFRAA >60 02/13/2020 1809   CrCl cannot be calculated (Patient's most recent lab result is older than the maximum 21 days allowed.).  COAG Lab Results  Component Value Date   INR 0.96 04/05/2015    Radiology No results found.   Assessment/Plan 1. Atherosclerosis of native artery of both lower extremities with rest pain (HCC) Recommend:  The patient has evidence of severe atherosclerotic changes of both lower extremities with rest pain that is associated with preulcerative changes and impending tissue loss of both feet right foot more so than left.  This represents a limb threatening ischemia and places the patient at increased risk for right limb loss.  Patient should undergo angiography of the right lower extremity with the hope for intervention for limb salvage.  The risks and benefits as well as the alternative therapies was discussed in detail with the patient.  All questions were answered.  Patient agrees to proceed with right lower extremity  angiography.  The left leg can be addressed after treating the right leg.  The patient will follow up with me in the office after the procedure.   The codes for  angiograms:  I70.229    Atherosclerotic occlusive disease with rest pain  CPT codes: 09811   stent placement femoral-popliteal artery 37228   angioplasty tibial peroneal artery 36247   introduction catheter below diaphragm third order    2. Coronary artery disease of native artery of native heart with stable angina pectoris (HCC) Continue cardiac and antihypertensive medications as already ordered and reviewed, no changes at this time.  Continue statin as ordered and reviewed, no changes at this time  Nitrates PRN for chest pain  3. Benign essential HTN Continue antihypertensive medications as already ordered, these medications have been reviewed and there are no changes at this time.  4. Type 2 diabetes mellitus with other circulatory complication, unspecified whether long term insulin use (HCC) Continue hypoglycemic medications as already ordered, these medications have been reviewed and there are no changes at this time.  Hgb A1C to be monitored as already arranged by primary service  5. Mixed hyperlipidemia Continue statin as ordered and reviewed, no changes at this time    [name], MD  06/21/2023 10:42 AM

## 2023-06-21 NOTE — H&P (View-Only) (Signed)
 MRN : [id number]  Juan Watson is a 72 y.o. (20-Feb-1951) male who presents with chief complaint of check circulation.  History of Present Illness:   The patient returns to the office for followup and review of the noninvasive studies.   The patient notes that there has been a significant deterioration in the lower extremity symptoms.  The patient notes interval shortening of their claudication distance and development of significant rest pain symptoms. No new ulcers or wounds have occurred since the last visit.  There have been no significant changes to the patient's overall health care.  The patient denies amaurosis fugax or recent TIA symptoms. There are no recent neurological changes noted. There is no history of DVT, PE or superficial thrombophlebitis. The patient denies recent episodes of angina or shortness of breath.   ABI's Rt=0.53 and Lt=0.87 (previous ABI's Rt=1.03 and Lt=1.11)    Current Meds  Medication Sig   acetaminophen (TYLENOL) 500 MG tablet Take 1,000 mg by mouth every 6 (six) hours as needed for mild pain or headache.   amLODipine (NORVASC) 10 MG tablet Take 1 tablet (10 mg total) by mouth daily.   aspirin EC 81 MG tablet Take 81 mg by mouth at bedtime.   chlorthalidone (HYGROTON) 25 MG tablet Take 25 mg by mouth daily.   cloNIDine (CATAPRES) 0.1 MG tablet TAKE 1 TABLET TWICE DAILY (Patient taking differently: Take 0.1 mg by mouth daily.)   diclofenac (VOLTAREN) 75 MG EC tablet Take 1 tablet (75 mg total) by mouth 2 (two) times daily as needed.   Febuxostat 80 MG TABS Take by mouth.   glucose blood test strip Use as instructed   labetalol (NORMODYNE) 200 MG tablet TAKE 1 TABLET TWICE DAILY   metFORMIN (GLUCOPHAGE) 500 MG tablet Take 2 tablets (1,000 mg total) by mouth 2 (two) times daily with a meal.   MOUNJARO 7.5 MG/0.5ML Pen Inject into the skin.   pantoprazole (PROTONIX) 40 MG tablet Take  40 mg by mouth daily.   rosuvastatin (CRESTOR) 10 MG tablet Take 1 tablet (10 mg total) by mouth daily.    Past Medical History:  Diagnosis Date   Acquired flat foot 11/15/2015   Arthritis of knee, degenerative 04/15/2015   BPH (benign prostatic hyperplasia)    Chewing tobacco nicotine dependence without complication 08/10/2020   Cholecystitis 06/28/2017   Diabetes mellitus without complication (HCC)    Diabetes mellitus, type 2 (HCC) 12/13/2002   Overview:  DIET CONTROLLED    Disease of nail 07/16/2011   Encounter for screening for malignant neoplasm of prostate 10/29/2014   Essential (primary) hypertension 10/17/2010   Familial aortic aneurysm 03/26/2014   Family history of cardiovascular disease 03/26/2014   Gastro-esophageal reflux disease without esophagitis 08/11/2013   Generalized OA 08/11/2013   Gout 03/26/2014   Heart murmur    HOH (hard of hearing)    aids   Motion sickness    "sea sick"   Primary osteoarthritis of right knee 04/14/2015   Pure hypercholesterolemia 12/13/2002   Skin lesion 03/11/2015    Past Surgical History:  Procedure Laterality Date   BACK SURGERY  77,87   CARDIAC CATHETERIZATION  70's   pericarditis   CATARACT EXTRACTION W/PHACO Right 03/23/2016   Procedure: CATARACT EXTRACTION PHACO AND  INTRAOCULAR LENS PLACEMENT (IOC);  Surgeon: Sallee Lange, MD;  Location: [facility];  Service: Ophthalmology;  Laterality: Right;  [place] 01:31    CATARACT EXTRACTION W/PHACO Left 10/23/2019   Procedure: CATARACT EXTRACTION PHACO AND INTRAOCULAR LENS PLACEMENT (IOC) LEFT DIABETIC TORIC LENS 5.82,   00:44.1;  Surgeon: Nevada Crane, MD;  Location: [facility];  Service: Ophthalmology;  Laterality: Left;  Diabetic - oral meds   CHOLECYSTECTOMY N/A 06/29/2017   Procedure: LAPAROSCOPIC CHOLECYSTECTOMY;  Surgeon: Henrene Dodge, MD;  Location: [facility];  Service: General;  Laterality: N/A;   COLONOSCOPY WITH PROPOFOL N/A 10/06/2017   Procedure: COLONOSCOPY WITH  PROPOFOL;  Surgeon: Scot Jun, MD;  Location: [facility];  Service: Endoscopy;  Laterality: N/A;   CORONARY STENT INTERVENTION N/A 04/11/2020   Procedure: CORONARY STENT INTERVENTION;  Surgeon: Alwyn Pea, MD;  Location: [facility];  Service: Cardiovascular;  Laterality: N/A;   HERNIA REPAIR  11,   umbilical x2   KNEE ARTHROSCOPY Right 12/20/2017   Procedure: ARTHROSCOPY KNEE EXTENSIVE SYNOVECTOMY AND LYSIS OF ADHESIONS;  Surgeon: Donato Heinz, MD;  Location: [facility];  Service: Orthopedics;  Laterality: Right;   KNEE DEBRIDEMENT Left    tendon   LEFT HEART CATH AND CORONARY ANGIOGRAPHY Left 04/11/2020   Procedure: LEFT HEART CATH AND CORONARY ANGIOGRAPHY;  Surgeon: Lamar Blinks, MD;  Location: [facility];  Service: Cardiovascular;  Laterality: Left;   LOWER EXTREMITY ANGIOGRAPHY Left 09/24/2020   Procedure: LOWER EXTREMITY ANGIOGRAPHY;  Surgeon: Renford Dills, MD;  Location: [facility];  Service: Cardiovascular;  Laterality: Left;   TONSILLECTOMY  72   TOTAL KNEE ARTHROPLASTY Right 04/15/2015   Procedure: TOTAL KNEE ARTHROPLASTY;  Surgeon: Gean Birchwood, MD;  Location: [facility];  Service: Orthopedics;  Laterality: Right;   TRANSURETHRAL RESECTION OF PROSTATE  ?    Social History Social History   Tobacco Use   Smoking status: Former    Current packs/day: 0.00    Average packs/day: 2.0 packs/day for 25.0 years (50.0 ttl pk-yrs)    Types: Cigarettes    Start date: 07/16/1967    Quit date: 07/15/1992    Years since quitting: 30.9   Smokeless tobacco: Current    Types: Chew  Vaping Use   Vaping status: Never Used  Substance Use Topics   Alcohol use: No   Drug use: No    Family History Family History  Problem Relation Age of Onset   Multiple myeloma Mother    Aneurysm Father    Aneurysm Brother    Nephrolithiasis Neg Hx    Bladder Cancer Neg Hx    Prostate cancer Neg Hx    Kidney cancer Neg Hx     Allergies  Allergen  Reactions   Bee Venom Shortness Of Breath and Swelling   Sulfa Antibiotics Swelling    Tongue swelling   Ben Gay Ultra [Menthol] Other (See Comments)    Whelts and redness of skin     REVIEW OF SYSTEMS (Negative unless checked)  Constitutional: [] Weight loss  [] Fever  [] Chills Cardiac: [] Chest pain   [] Chest pressure   [] Palpitations   [] Shortness of breath when laying flat   [] Shortness of breath with exertion. Vascular:  [x] Pain in legs with walking   [] Pain in legs at rest  [] History of DVT   [] Phlebitis   [] Swelling in legs   [] Varicose veins   [] Non-healing ulcers Pulmonary:   [] Uses home oxygen   [] Productive cough   [] Hemoptysis   []   Wheeze  [] COPD   [] Asthma Neurologic:  [] Dizziness   [] Seizures   [] History of stroke   [] History of TIA  [] Aphasia   [] Vissual changes   [] Weakness or numbness in arm   [] Weakness or numbness in leg Musculoskeletal:   [] Joint swelling   [] Joint pain   [] Low back pain Hematologic:  [] Easy bruising  [] Easy bleeding   [] Hypercoagulable state   [] Anemic Gastrointestinal:  [] Diarrhea   [] Vomiting  [] Gastroesophageal reflux/heartburn   [] Difficulty swallowing. Genitourinary:  [] Chronic kidney disease   [] Difficult urination  [] Frequent urination   [] Blood in urine Skin:  [] Rashes   [] Ulcers  Psychological:  [] History of anxiety   []  History of major depression.  Physical Examination  Vitals:   06/21/23 1027  BP: 119/72  Pulse: 67  Resp: 18  Weight: 228 lb (103.4 kg)  Height: 5\' 7"  (1.702 m)   Body mass index is 35.71 kg/m. Gen: WD/WN, NAD Head: [place]/AT, No temporalis wasting.  Ear/Nose/Throat: Hearing grossly intact, nares w/o erythema or drainage Eyes: PER, EOMI, sclera nonicteric.  Neck: Supple, no masses.  No bruit or JVD.  Pulmonary:  Good air movement, no audible wheezing, no use of accessory muscles.  Cardiac: RRR, normal S1, S2, no Murmurs. Vascular:  mild trophic changes, no open wounds Vessel Right Left  Radial Palpable Palpable  PT  Not Palpable Not Palpable  DP Not Palpable Not Palpable  Gastrointestinal: soft, non-distended. No guarding/no peritoneal signs.  Musculoskeletal: M/S 5/5 throughout.  No visible deformity.  Neurologic: CN 2-12 intact. Pain and light touch intact in extremities.  Symmetrical.  Speech is fluent. Motor exam as listed above. Psychiatric: Judgment intact, Mood & affect appropriate for pt's clinical situation. Dermatologic: No rashes or ulcers noted.  No changes consistent with cellulitis.   CBC Lab Results  Component Value Date   WBC 7.6 12/02/2020   HGB 13.6 12/02/2020   HCT 40.2 12/02/2020   MCV 81.4 12/02/2020   PLT 214.0 12/02/2020    BMET    Component Value Date/Time   NA 139 12/02/2020 0812   K 4.3 12/02/2020 0812   CL 101 12/02/2020 0812   CO2 28 12/02/2020 0812   GLUCOSE 175 (H) 12/02/2020 0812   BUN 20 12/02/2020 0812   CREATININE 1.11 12/02/2020 0812   CALCIUM 9.8 12/02/2020 0812   GFRNONAA >60 09/24/2020 0733   GFRAA >60 02/13/2020 1809   CrCl cannot be calculated (Patient's most recent lab result is older than the maximum 21 days allowed.).  COAG Lab Results  Component Value Date   INR 0.96 04/05/2015    Radiology No results found.   Assessment/Plan 1. Atherosclerosis of native artery of both lower extremities with rest pain (HCC) Recommend:  The patient has evidence of severe atherosclerotic changes of both lower extremities with rest pain that is associated with preulcerative changes and impending tissue loss of both feet right foot more so than left.  This represents a limb threatening ischemia and places the patient at increased risk for right limb loss.  Patient should undergo angiography of the right lower extremity with the hope for intervention for limb salvage.  The risks and benefits as well as the alternative therapies was discussed in detail with the patient.  All questions were answered.  Patient agrees to proceed with right lower extremity  angiography.  The left leg can be addressed after treating the right leg.  The patient will follow up with me in the office after the procedure.   The codes for  angiograms:  I70.229    Atherosclerotic occlusive disease with rest pain  CPT codes: 09811   stent placement femoral-popliteal artery 37228   angioplasty tibial peroneal artery 36247   introduction catheter below diaphragm third order    2. Coronary artery disease of native artery of native heart with stable angina pectoris (HCC) Continue cardiac and antihypertensive medications as already ordered and reviewed, no changes at this time.  Continue statin as ordered and reviewed, no changes at this time  Nitrates PRN for chest pain  3. Benign essential HTN Continue antihypertensive medications as already ordered, these medications have been reviewed and there are no changes at this time.  4. Type 2 diabetes mellitus with other circulatory complication, unspecified whether long term insulin use (HCC) Continue hypoglycemic medications as already ordered, these medications have been reviewed and there are no changes at this time.  Hgb A1C to be monitored as already arranged by primary service  5. Mixed hyperlipidemia Continue statin as ordered and reviewed, no changes at this time    [name], MD  06/21/2023 10:42 AM

## 2023-06-24 ENCOUNTER — Ambulatory Visit (INDEPENDENT_AMBULATORY_CARE_PROVIDER_SITE_OTHER): Payer: Medicare PPO | Admitting: Vascular Surgery

## 2023-07-05 ENCOUNTER — Telehealth (INDEPENDENT_AMBULATORY_CARE_PROVIDER_SITE_OTHER): Payer: Self-pay

## 2023-07-05 NOTE — Telephone Encounter (Signed)
Spoke with the patient and he is scheduled for a right leg angio on 07/13/23 with Dr. Gilda Crease with a 8:30 am arrival time to the [facility]. Pre-procedure instructions were discussed and will be mailed.

## 2023-07-13 ENCOUNTER — Encounter
Admission: RE | Disposition: A | Discharge status: Home / Self Care | Payer: Self-pay | Source: Ambulatory Visit | Attending: Vascular Surgery

## 2023-07-13 ENCOUNTER — Other Ambulatory Visit: Payer: Self-pay

## 2023-07-13 ENCOUNTER — Ambulatory Visit
Admission: RE | Admit: 2023-07-13 | Discharge: 2023-07-13 | Disposition: A | Discharge status: Home / Self Care | Payer: Medicare PPO | Source: Ambulatory Visit | Attending: Vascular Surgery | Admitting: Vascular Surgery

## 2023-07-13 ENCOUNTER — Encounter: Payer: Self-pay | Admitting: Vascular Surgery

## 2023-07-13 DIAGNOSIS — I70223 Atherosclerosis of native arteries of extremities with rest pain, bilateral legs: Secondary | ICD-10-CM

## 2023-07-13 DIAGNOSIS — Z87891 Personal history of nicotine dependence: Secondary | ICD-10-CM | POA: Diagnosis not present

## 2023-07-13 DIAGNOSIS — I70221 Atherosclerosis of native arteries of extremities with rest pain, right leg: Secondary | ICD-10-CM | POA: Diagnosis not present

## 2023-07-13 DIAGNOSIS — E1151 Type 2 diabetes mellitus with diabetic peripheral angiopathy without gangrene: Secondary | ICD-10-CM | POA: Insufficient documentation

## 2023-07-13 DIAGNOSIS — E782 Mixed hyperlipidemia: Secondary | ICD-10-CM | POA: Diagnosis not present

## 2023-07-13 DIAGNOSIS — Z7984 Long term (current) use of oral hypoglycemic drugs: Secondary | ICD-10-CM | POA: Diagnosis not present

## 2023-07-13 DIAGNOSIS — I10 Essential (primary) hypertension: Secondary | ICD-10-CM | POA: Diagnosis not present

## 2023-07-13 DIAGNOSIS — I700 Atherosclerosis of aorta: Secondary | ICD-10-CM

## 2023-07-13 DIAGNOSIS — I25118 Atherosclerotic heart disease of native coronary artery with other forms of angina pectoris: Secondary | ICD-10-CM | POA: Insufficient documentation

## 2023-07-13 DIAGNOSIS — I70229 Atherosclerosis of native arteries of extremities with rest pain, unspecified extremity: Secondary | ICD-10-CM

## 2023-07-13 HISTORY — PX: LOWER EXTREMITY ANGIOGRAPHY: CATH118251

## 2023-07-13 LAB — GLUCOSE, CAPILLARY
Glucose-Capillary: 132 mg/dL — ABNORMAL HIGH (ref 70–99)
Glucose-Capillary: 97 mg/dL (ref 70–99)

## 2023-07-13 LAB — CREATININE, SERUM
Creatinine, Ser: 0.97 mg/dL (ref 0.61–1.24)
GFR, Estimated: >60 mL/min (ref >60)

## 2023-07-13 LAB — BUN: BUN: 17 mg/dL (ref 8–23)

## 2023-07-13 SURGERY — LOWER EXTREMITY ANGIOGRAPHY
Anesthesia: Moderate Sedation | Site: Leg Lower | Laterality: Right

## 2023-07-13 MED ORDER — FAMOTIDINE 20 MG PO TABS: 40.0000 mg | ORAL_TABLET | Freq: Once | ORAL | Status: DC | PRN

## 2023-07-13 MED ORDER — IODIXANOL 320 MG/ML IV SOLN
INTRAVENOUS | Status: DC | PRN
  Administered 2023-07-13: 125 mL via INTRA_ARTERIAL

## 2023-07-13 MED ORDER — HEPARIN SODIUM (PORCINE) 1000 UNIT/ML IJ SOLN
INTRAMUSCULAR | Status: AC
  Filled 2023-07-13: qty 10

## 2023-07-13 MED ORDER — SODIUM CHLORIDE 0.9 % IV SOLN: INTRAVENOUS | Status: DC

## 2023-07-13 MED ORDER — MIDAZOLAM HCL 5 MG/5ML IJ SOLN
INTRAMUSCULAR | Status: AC
  Filled 2023-07-13: qty 5

## 2023-07-13 MED ORDER — HYDROMORPHONE HCL 1 MG/ML IJ SOLN: 1.0000 mg | Freq: Once | INTRAMUSCULAR | Status: DC | PRN

## 2023-07-13 MED ORDER — MIDAZOLAM HCL 2 MG/2ML IJ SOLN
INTRAMUSCULAR | Status: DC | PRN
  Administered 2023-07-13: 2 mg via INTRAVENOUS
  Administered 2023-07-13: 1 mg via INTRAVENOUS

## 2023-07-13 MED ORDER — HEPARIN (PORCINE) IN NACL 2000-0.9 UNIT/L-% IV SOLN
INTRAVENOUS | Status: DC | PRN
  Administered 2023-07-13: 1000 mL

## 2023-07-13 MED ORDER — HEPARIN SODIUM (PORCINE) 1000 UNIT/ML IJ SOLN
INTRAMUSCULAR | Status: DC | PRN
  Administered 2023-07-13: 6000 [IU] via INTRAVENOUS

## 2023-07-13 MED ORDER — LIDOCAINE HCL (PF) 1 % IJ SOLN
INTRAMUSCULAR | Status: DC | PRN
  Administered 2023-07-13: 10 mL via INTRADERMAL

## 2023-07-13 MED ORDER — ONDANSETRON HCL 4 MG/2ML IJ SOLN: 4.0000 mg | Freq: Four times a day (QID) | INTRAMUSCULAR | Status: DC | PRN

## 2023-07-13 MED ORDER — MORPHINE SULFATE (PF) 4 MG/ML IV SOLN: 2.0000 mg | INTRAVENOUS | Status: DC | PRN

## 2023-07-13 MED ORDER — CEFAZOLIN SODIUM-DEXTROSE 2-4 GM/100ML-% IV SOLN
INTRAVENOUS | Status: AC
  Filled 2023-07-13: qty 100

## 2023-07-13 MED ORDER — CEFAZOLIN SODIUM-DEXTROSE 2-4 GM/100ML-% IV SOLN
2.0000 g | INTRAVENOUS | Status: AC
  Administered 2023-07-13: 2 g via INTRAVENOUS

## 2023-07-13 MED ORDER — OXYCODONE HCL 5 MG PO TABS: 5.0000 mg | ORAL_TABLET | ORAL | Status: DC | PRN

## 2023-07-13 MED ORDER — HYDRALAZINE HCL 20 MG/ML IJ SOLN: 5.0000 mg | INTRAMUSCULAR | Status: DC | PRN

## 2023-07-13 MED ORDER — METHYLPREDNISOLONE SODIUM SUCC 125 MG IJ SOLR: 125.0000 mg | Freq: Once | INTRAMUSCULAR | Status: DC | PRN

## 2023-07-13 MED ORDER — FENTANYL CITRATE (PF) 100 MCG/2ML IJ SOLN
INTRAMUSCULAR | Status: DC | PRN
  Administered 2023-07-13: 50 ug via INTRAVENOUS
  Administered 2023-07-13: 25 ug via INTRAVENOUS

## 2023-07-13 MED ORDER — FENTANYL CITRATE (PF) 100 MCG/2ML IJ SOLN
INTRAMUSCULAR | Status: AC
  Filled 2023-07-13: qty 2

## 2023-07-13 MED ORDER — MIDAZOLAM HCL 2 MG/ML PO SYRP: 8.0000 mg | ORAL_SOLUTION | Freq: Once | ORAL | Status: DC | PRN

## 2023-07-13 MED ORDER — DIPHENHYDRAMINE HCL 50 MG/ML IJ SOLN: 50.0000 mg | Freq: Once | INTRAMUSCULAR | Status: DC | PRN

## 2023-07-13 MED ORDER — ACETAMINOPHEN 325 MG PO TABS: 650.0000 mg | ORAL_TABLET | ORAL | Status: DC | PRN

## 2023-07-13 MED ORDER — SODIUM CHLORIDE 0.9% FLUSH: 3.0000 mL | Freq: Two times a day (BID) | INTRAVENOUS | Status: DC

## 2023-07-13 MED ORDER — SODIUM CHLORIDE 0.9% FLUSH: 3.0000 mL | INTRAVENOUS | Status: DC | PRN

## 2023-07-13 MED ORDER — SODIUM CHLORIDE 0.9 % IV SOLN: 250.0000 mL | INTRAVENOUS | Status: DC | PRN

## 2023-07-13 MED ORDER — LABETALOL HCL 5 MG/ML IV SOLN: 10.0000 mg | INTRAVENOUS | Status: DC | PRN

## 2023-07-13 SURGICAL SUPPLY — 20 items
CATH ANGIO 5F PIGTAIL 65CM (CATHETERS) IMPLANT
CATH VERT 5FR 125CM (CATHETERS) IMPLANT
COVER PROBE ULTRASOUND 5X96 (MISCELLANEOUS) IMPLANT
DEVICE STARCLOSE SE CLOSURE (Vascular Products) IMPLANT
DEVICE TORQUE (MISCELLANEOUS) IMPLANT
GLIDEWIRE ADV .035X260CM (WIRE) IMPLANT
GLIDEWIRE ANGLED SS 035X260CM (WIRE) IMPLANT
GOWN STRL REUS W/ TWL LRG LVL3 (GOWN DISPOSABLE) ×1 IMPLANT
GOWN STRL REUS W/TWL LRG LVL3 (GOWN DISPOSABLE) ×1
GUIDEWIRE PFTE-COATED .018X300 (WIRE) IMPLANT
KIT ENCORE 40 (KITS) IMPLANT
NDL ENTRY 21GA 7CM ECHOTIP (NEEDLE) IMPLANT
NEEDLE ENTRY 21GA 7CM ECHOTIP (NEEDLE) ×1
PACK ANGIOGRAPHY (CUSTOM PROCEDURE TRAY) ×1 IMPLANT
SET INTRO CAPELLA COAXIAL (SET/KITS/TRAYS/PACK) IMPLANT
SHEATH ANL2 6FRX45 HC (SHEATH) IMPLANT
SHEATH BRITE TIP 5FRX11 (SHEATH) IMPLANT
SYR MEDRAD MARK 7 150ML (SYRINGE) IMPLANT
TUBING CONTRAST HIGH PRESS 72 (TUBING) IMPLANT
WIRE GUIDERIGHT .035X150 (WIRE) IMPLANT

## 2023-07-13 NOTE — Interval H&P Note (Signed)
History and Physical Interval Note:  07/13/2023 8:42 AM  Juan Watson  has presented today for surgery, with the diagnosis of RLE Angio   ASO w rest pain.  The various methods of treatment have been discussed with the patient and family. After consideration of risks, benefits and other options for treatment, the patient has consented to  Procedure(s): Lower Extremity Angiography (Right) as a surgical intervention.  The patient's history has been reviewed, patient examined, no change in status, stable for surgery.  I have reviewed the patient's chart and labs.  Questions were answered to the patient's satisfaction.     [name]

## 2023-07-13 NOTE — Op Note (Signed)
[facility]  Percutaneous Study/Intervention Procedural Note   Date of Surgery: 07/13/2023,12:21 PM  Surgeon:Nasiir Monts, Latina Craver   Pre-operative Diagnosis: Atherosclerotic occlusive disease bilateral lower extremities with rest pain right leg more so than left  Post-operative diagnosis:  Same  Procedure(s) Performed:  1.  Abdominal aortogram  2.  Selective injection of the right lower extremity third order catheter placement  3.  Ultrasound-guided access to the left common femoral artery  4.  StarClose left femoral artery    Anesthesia: Conscious sedation was administered by the interventional radiology RN under my direct supervision. IV Versed plus fentanyl were utilized. Continuous ECG, pulse oximetry and blood pressure was monitored throughout the entire procedure.  Conscious sedation was administered for a total of 60 minutes.  Sheath: 6 [place] Ansell sheath retrograde left common femoral  Contrast: 125 cc   Fluoroscopy Time: 10.4 minutes  Indications:  The patient presents to [facility] with atherosclerotic occlusive disease bilateral lower extremities with rest pain of his lower extremities.  Patient notes the right leg hurts more than the left and therefore right leg is being evaluated first.  Pedal pulses are nonpalpable bilaterally suggesting hemodynamically significant atherosclerotic occlusive disease.  The risks and benefits as well as alternative therapies for lower extremity revascularization are reviewed with the patient all questions are answered the patient agrees to proceed.  The patient is therefore undergoing angiography with the hope for intervention for limb salvage.   Procedure:  Juan Watson a 72 y.o. male who was identified and appropriate procedural time out was performed.  The patient was then placed supine on the table and prepped and draped in the usual sterile fashion.  Ultrasound was used to evaluate the left common  femoral artery.  It was echolucent and pulsatile indicating it is patent .  An ultrasound image was acquired for the permanent record.  A micropuncture needle was used to access the left common femoral artery under direct ultrasound guidance.  The microwire was then advanced under fluoroscopic guidance without difficulty followed by the micro-sheath.  A 0.035 J wire was advanced without resistance and a 5Fr sheath was placed.    Pigtail catheter was then advanced to the level of T12 and AP projection of the aorta was obtained. Pigtail catheter was then repositioned to above the bifurcation and LAO view of the pelvis was obtained. Stiff angled Glidewire and pigtail catheter was then used across the bifurcation and the catheter was positioned in the distal external iliac artery.  RAO of the right groin was then obtained. Wire was reintroduced and negotiated into the SFA and the catheter was advanced into the SFA. Distal runoff was then performed.  A 6 French Ansell sheath was exchanged for the Pinnacle sheath and using a Kumpe catheter in association with the advantage wire both for an 035 as well as 018 and a stiff angled Glidewire I attempted to negotiate down below the knee.  Ultimately due to the extensive nature of the disease this was not possible and I elected to perform bolus injections through the sheath positioned in the mid common femoral.  After obtaining distal imaging I terminated the case.  After review of the images the catheter was removed over wire and an LAO view of the groin was obtained. StarClose device was deployed without difficulty.   Findings:   Aortogram: The abdominal aorta is opacified with a bolus injection contrast.  Single renal arteries are noted bilaterally with normal nephrograms.  No evidence of  hemodynamically significant renal artery stenosis.  There are no hemodynamically significant stenoses identified within the aorta.  The aortic bifurcation is mildly diseased but  widely patent.  Bilateral common internal and external iliac arteries are free of hemodynamically significant lesions.  Right lower Extremity: The right common femoral demonstrates diffuse disease but there are no hemodynamically significant stenoses.  There appears to be dual profunda femoris arteries.  Superficial femoral artery occludes in its midportion and reconstitutes at Hunter's canal but then reoccludes.  Essentially the popliteal artery is occluded from its origin through its entirety including the tibioperoneal trunk.  Posterior tibial is also occluded.  There is reconstitution of the anterior tibial near its origin but then in its midportion there is a critical lesion greater than 90% over 10 to 15 mm in length.  The peroneal reconstitutes in its midportion but there is a critical lesion in the distal one third.  The exact anatomy of the foot is a bit difficult to ascertain.  Clearly at the level of the ankle the peroneal appears to be the dominant filling a branch that extends laterally along the fifth ray.  The anterior tibial does cross the ankle and fill the dorsalis pedis which does not appear to have any hemodynamically significant stenoses but is not as large as the aforementioned collateral.  These to do have a junction and then fill the pedal arch.  SUMMARY: Based on these images no intervention is performed at this time.  The option available would be a femoral to distal bypass with the target in the distal anterior tibial or perhaps the peroneal as it crosses the ankle.  Patient will need vein mapping for bypass    Disposition: Patient was taken to the recovery room in stable condition having tolerated the procedure well.  [name] 07/13/2023,12:21 PM

## 2023-07-14 ENCOUNTER — Encounter: Payer: Self-pay | Admitting: Vascular Surgery

## 2023-07-15 ENCOUNTER — Other Ambulatory Visit (INDEPENDENT_AMBULATORY_CARE_PROVIDER_SITE_OTHER): Payer: Self-pay | Admitting: Vascular Surgery

## 2023-07-15 DIAGNOSIS — I70221 Atherosclerosis of native arteries of extremities with rest pain, right leg: Secondary | ICD-10-CM

## 2023-07-15 DIAGNOSIS — Z01810 Encounter for preprocedural cardiovascular examination: Secondary | ICD-10-CM

## 2023-07-19 ENCOUNTER — Ambulatory Visit (INDEPENDENT_AMBULATORY_CARE_PROVIDER_SITE_OTHER): Payer: Medicare PPO

## 2023-07-19 ENCOUNTER — Ambulatory Visit (INDEPENDENT_AMBULATORY_CARE_PROVIDER_SITE_OTHER): Payer: Medicare PPO | Admitting: Vascular Surgery

## 2023-07-19 ENCOUNTER — Encounter (INDEPENDENT_AMBULATORY_CARE_PROVIDER_SITE_OTHER): Payer: Self-pay | Admitting: Vascular Surgery

## 2023-07-19 VITALS — BP 129/64 | HR 63 | Resp 18 | Ht 67.0 in | Wt 228.0 lb

## 2023-07-19 DIAGNOSIS — I70221 Atherosclerosis of native arteries of extremities with rest pain, right leg: Secondary | ICD-10-CM | POA: Diagnosis not present

## 2023-07-19 DIAGNOSIS — E782 Mixed hyperlipidemia: Secondary | ICD-10-CM | POA: Diagnosis not present

## 2023-07-19 DIAGNOSIS — Z01810 Encounter for preprocedural cardiovascular examination: Secondary | ICD-10-CM

## 2023-07-19 DIAGNOSIS — I10 Essential (primary) hypertension: Secondary | ICD-10-CM

## 2023-07-19 DIAGNOSIS — I25118 Atherosclerotic heart disease of native coronary artery with other forms of angina pectoris: Secondary | ICD-10-CM | POA: Diagnosis not present

## 2023-07-19 DIAGNOSIS — I7025 Atherosclerosis of native arteries of other extremities with ulceration: Secondary | ICD-10-CM | POA: Diagnosis not present

## 2023-07-19 DIAGNOSIS — M1711 Unilateral primary osteoarthritis, right knee: Secondary | ICD-10-CM

## 2023-07-25 ENCOUNTER — Encounter (INDEPENDENT_AMBULATORY_CARE_PROVIDER_SITE_OTHER): Payer: Self-pay | Admitting: Vascular Surgery

## 2023-07-25 DIAGNOSIS — I7025 Atherosclerosis of native arteries of other extremities with ulceration: Secondary | ICD-10-CM | POA: Insufficient documentation

## 2023-07-25 NOTE — H&P (View-Only) (Signed)
MRN : [id number]  Juan Watson is a 72 y.o. (1951/11/23) male who presents with chief complaint of check circulation.  History of Present Illness:  The patient returns to the office for followup and review status post angiogram with intervention on 07/13/2023.   Procedure: Diagnostic imaging of the right lower extremity demonstrating SFA and popliteal artery occlusions with significant tibial vessel disease  The patient denies any improvement in the lower extremity symptoms. No interval shortening of the patient's claudication distance and his rest pain symptoms continue.  He does have an ulcer associated with a tophaceous deposit of his right great toe.  No new ulcers or wounds have occurred since the last visit.  There have been no significant changes to the patient's overall health care.  No documented history of amaurosis fugax or recent TIA symptoms. There are no recent neurological changes noted. No documented history of DVT, PE or superficial thrombophlebitis. The patient denies recent episodes of angina or shortness of breath.     Current Meds  Medication Sig   acetaminophen (TYLENOL) 500 MG tablet Take 1,000 mg by mouth every 6 (six) hours as needed for mild pain or headache.   amLODipine (NORVASC) 10 MG tablet Take 1 tablet (10 mg total) by mouth daily.   aspirin EC 81 MG tablet Take 81 mg by mouth at bedtime.   chlorthalidone (HYGROTON) 25 MG tablet Take 25 mg by mouth daily.   cloNIDine (CATAPRES) 0.1 MG tablet TAKE 1 TABLET TWICE DAILY (Patient taking differently: Take 0.1 mg by mouth daily.)   diclofenac (VOLTAREN) 75 MG EC tablet Take 1 tablet (75 mg total) by mouth 2 (two) times daily as needed.   Febuxostat 80 MG TABS Take by mouth.   glucose blood test strip Use as instructed   labetalol (NORMODYNE) 200 MG tablet TAKE 1 TABLET TWICE DAILY   magnesium oxide (MAG-OX) 400 MG tablet Take 400 mg by  mouth daily.   metFORMIN (GLUCOPHAGE) 500 MG tablet Take 2 tablets (1,000 mg total) by mouth 2 (two) times daily with a meal.   MOUNJARO 7.5 MG/0.5ML Pen Inject into the skin.   pantoprazole (PROTONIX) 40 MG tablet Take 40 mg by mouth daily.   pregabalin (LYRICA) 50 MG capsule Take 50 mg by mouth at bedtime.   rosuvastatin (CRESTOR) 10 MG tablet Take 1 tablet (10 mg total) by mouth daily.   telmisartan (MICARDIS) 80 MG tablet Take 80 mg by mouth daily with supper.     Past Medical History:  Diagnosis Date   Acquired flat foot 11/15/2015   Arthritis of knee, degenerative 04/15/2015   BPH (benign prostatic hyperplasia)    Chewing tobacco nicotine dependence without complication 08/10/2020   Cholecystitis 06/28/2017   Diabetes mellitus without complication (HCC)    Diabetes mellitus, type 2 (HCC) 12/13/2002   Overview:  DIET CONTROLLED    Disease of nail 07/16/2011   Encounter for screening for malignant neoplasm of prostate 10/29/2014   Essential (primary) hypertension 10/17/2010   Familial aortic aneurysm 03/26/2014   Family history of cardiovascular disease 03/26/2014   Gastro-esophageal reflux disease without  esophagitis 08/11/2013   Generalized OA 08/11/2013   Gout 03/26/2014   Heart murmur    HOH (hard of hearing)    aids   Motion sickness    "sea sick"   Primary osteoarthritis of right knee 04/14/2015   Pure hypercholesterolemia 12/13/2002   Skin lesion 03/11/2015    Past Surgical History:  Procedure Laterality Date   BACK SURGERY  77,87   CARDIAC CATHETERIZATION  70's   pericarditis   CATARACT EXTRACTION W/PHACO Right 03/23/2016   Procedure: CATARACT EXTRACTION PHACO AND INTRAOCULAR LENS PLACEMENT (IOC);  Surgeon: Sallee Lange, MD;  Location: [facility];  Service: Ophthalmology;  Laterality: Right;  [place] 01:31    CATARACT EXTRACTION W/PHACO Left 10/23/2019   Procedure: CATARACT EXTRACTION PHACO AND INTRAOCULAR LENS PLACEMENT (IOC) LEFT DIABETIC TORIC LENS 5.82,   00:44.1;   Surgeon: Nevada Crane, MD;  Location: [facility];  Service: Ophthalmology;  Laterality: Left;  Diabetic - oral meds   CHOLECYSTECTOMY N/A 06/29/2017   Procedure: LAPAROSCOPIC CHOLECYSTECTOMY;  Surgeon: Henrene Dodge, MD;  Location: [facility];  Service: General;  Laterality: N/A;   COLONOSCOPY WITH PROPOFOL N/A 10/06/2017   Procedure: COLONOSCOPY WITH PROPOFOL;  Surgeon: Scot Jun, MD;  Location: [facility];  Service: Endoscopy;  Laterality: N/A;   CORONARY STENT INTERVENTION N/A 04/11/2020   Procedure: CORONARY STENT INTERVENTION;  Surgeon: Alwyn Pea, MD;  Location: [facility];  Service: Cardiovascular;  Laterality: N/A;   HERNIA REPAIR  11,   umbilical x2   KNEE ARTHROSCOPY Right 12/20/2017   Procedure: ARTHROSCOPY KNEE EXTENSIVE SYNOVECTOMY AND LYSIS OF ADHESIONS;  Surgeon: Donato Heinz, MD;  Location: [facility];  Service: Orthopedics;  Laterality: Right;   KNEE DEBRIDEMENT Left    tendon   LEFT HEART CATH AND CORONARY ANGIOGRAPHY Left 04/11/2020   Procedure: LEFT HEART CATH AND CORONARY ANGIOGRAPHY;  Surgeon: Lamar Blinks, MD;  Location: [facility];  Service: Cardiovascular;  Laterality: Left;   LOWER EXTREMITY ANGIOGRAPHY Left 09/24/2020   Procedure: LOWER EXTREMITY ANGIOGRAPHY;  Surgeon: Renford Dills, MD;  Location: [facility];  Service: Cardiovascular;  Laterality: Left;   LOWER EXTREMITY ANGIOGRAPHY Right 07/13/2023   Procedure: Lower Extremity Angiography;  Surgeon: Renford Dills, MD;  Location: [facility];  Service: Cardiovascular;  Laterality: Right;   TONSILLECTOMY  72   TOTAL KNEE ARTHROPLASTY Right 04/15/2015   Procedure: TOTAL KNEE ARTHROPLASTY;  Surgeon: Gean Birchwood, MD;  Location: [facility];  Service: Orthopedics;  Laterality: Right;   TRANSURETHRAL RESECTION OF PROSTATE  ?    Social History Social History   Tobacco Use   Smoking status: Former    Current packs/day: 0.00    Average  packs/day: 2.0 packs/day for 25.0 years (50.0 ttl pk-yrs)    Types: Cigarettes    Start date: 07/16/1967    Quit date: 07/15/1992    Years since quitting: 31.0   Smokeless tobacco: Current    Types: Chew  Vaping Use   Vaping status: Never Used  Substance Use Topics   Alcohol use: No   Drug use: No    Family History Family History  Problem Relation Age of Onset   Multiple myeloma Mother    Aneurysm Father    Aneurysm Brother    Nephrolithiasis Neg Hx    Bladder Cancer Neg Hx    Prostate cancer Neg Hx    Kidney cancer Neg Hx     Allergies  Allergen Reactions   Bee  Venom Shortness Of Breath and Swelling   Sulfa Antibiotics Swelling    Tongue swelling   Ben Gay Ultra [Menthol] Other (See Comments)    Whelts and redness of skin     REVIEW OF SYSTEMS (Negative unless checked)  Constitutional: [] Weight loss  [] Fever  [] Chills Cardiac: [] Chest pain   [] Chest pressure   [] Palpitations   [] Shortness of breath when laying flat   [] Shortness of breath with exertion. Vascular:  [x] Pain in legs with walking   [] Pain in legs at rest  [] History of DVT   [] Phlebitis   [] Swelling in legs   [] Varicose veins   [] Non-healing ulcers Pulmonary:   [] Uses home oxygen   [] Productive cough   [] Hemoptysis   [] Wheeze  [] COPD   [] Asthma Neurologic:  [] Dizziness   [] Seizures   [] History of stroke   [] History of TIA  [] Aphasia   [] Vissual changes   [] Weakness or numbness in arm   [] Weakness or numbness in leg Musculoskeletal:   [] Joint swelling   [] Joint pain   [] Low back pain Hematologic:  [] Easy bruising  [] Easy bleeding   [] Hypercoagulable state   [] Anemic Gastrointestinal:  [] Diarrhea   [] Vomiting  [] Gastroesophageal reflux/heartburn   [] Difficulty swallowing. Genitourinary:  [] Chronic kidney disease   [] Difficult urination  [] Frequent urination   [] Blood in urine Skin:  [] Rashes   [] Ulcers  Psychological:  [] History of anxiety   []  History of major depression.  Physical Examination  Vitals:    07/19/23 1623  BP: 129/64  Pulse: 63  Resp: 18  Weight: 228 lb (103.4 kg)  Height: 5\' 7"  (1.702 m)   Body mass index is 35.71 kg/m. Gen: WD/WN, NAD Head: [place]/AT, No temporalis wasting.  Ear/Nose/Throat: Hearing grossly intact, nares w/o erythema or drainage Eyes: PER, EOMI, sclera nonicteric.  Neck: Supple, no masses.  No bruit or JVD.  Pulmonary:  Good air movement, no audible wheezing, no use of accessory muscles.  Cardiac: RRR, normal S1, S2, no Murmurs. Vascular:  mild trophic changes, small open wound associated with the tophaceous deposit of the right great toe uninfected Vessel Right Left  Radial Palpable Palpable  PT Not Palpable Not Palpable  DP Not Palpable Not Palpable  Gastrointestinal: soft, non-distended. No guarding/no peritoneal signs.  Musculoskeletal: M/S 5/5 throughout.  No visible deformity.  Neurologic: CN 2-12 intact. Pain and light touch intact in extremities.  Symmetrical.  Speech is fluent. Motor exam as listed above. Psychiatric: Judgment intact, Mood & affect appropriate for pt's clinical situation. Dermatologic: No rashes + ulcers noted.  No changes consistent with cellulitis.   CBC Lab Results  Component Value Date   WBC 7.6 12/02/2020   HGB 13.6 12/02/2020   HCT 40.2 12/02/2020   MCV 81.4 12/02/2020   PLT 214.0 12/02/2020    BMET    Component Value Date/Time   NA 139 12/02/2020 0812   K 4.3 12/02/2020 0812   CL 101 12/02/2020 0812   CO2 28 12/02/2020 0812   GLUCOSE 175 (H) 12/02/2020 0812   BUN 17 07/13/2023 0838   CREATININE 0.97 07/13/2023 0838   CALCIUM 9.8 12/02/2020 0812   GFRNONAA >60 07/13/2023 0838   GFRAA >60 02/13/2020 1809   Estimated Creatinine Clearance: 78.9 mL/min (by C-G formula based on SCr of 0.97 mg/dL).  COAG Lab Results  Component Value Date   INR 0.96 04/05/2015    Radiology VAS [place] LOWER EXT SAPHENOUS VEIN MAPPING  Result Date: 07/19/2023 LOWER EXTREMITY VEIN MAPPING Patient Name:  TOCHI ACRES  Date  of Exam:   07/19/2023 Medical Rec #: [id number]       Accession #:    [id number] Date of Birth: [birth year]       Patient Gender: M Patient Age:   63 years Exam Location:  [facility] Procedure:      VAS [place] LOWER EXTREMITY SAPHENOUS VEIN MAPPING Referring Phys: [name] --------------------------------------------------------------------------------  Other Indications: Pre-op  Performing Technologist: Debbe Bales RVS  Examination Guidelines: A complete evaluation includes B-mode imaging, spectral Doppler, color Doppler, and power Doppler as needed of all accessible portions of each vessel. Bilateral testing is considered an integral part of a complete examination. Limited examinations for reoccurring indications may be performed as noted. +---------------+-----------+----------------------+---------------+-----------+   RT Diameter  RT Findings         GSV            LT Diameter  LT Findings      (cm)                                            (cm)                  +---------------+-----------+----------------------+---------------+-----------+      0.66                     Saphenofemoral         0.57                                                   Junction                                  +---------------+-----------+----------------------+---------------+-----------+      0.43                     Proximal thigh         0.44                  +---------------+-----------+----------------------+---------------+-----------+      0.22                       Mid thigh            0.37                  +---------------+-----------+----------------------+---------------+-----------+      0.26                      Distal thigh          0.46                  +---------------+-----------+----------------------+---------------+-----------+      0.29                          Knee              0.34                   +---------------+-----------+----------------------+---------------+-----------+      0.29  Prox calf            0.28                  +---------------+-----------+----------------------+---------------+-----------+      0.28                        Mid calf            0.26                  +---------------+-----------+----------------------+---------------+-----------+      0.32                      Distal calf           0.36                  +---------------+-----------+----------------------+---------------+-----------+      0.70                         Ankle              0.35                  +---------------+-----------+----------------------+---------------+-----------+ Summary:  Right: No evidence of clot seen in the Rt GSV,; Peroneal Artery        distal Calf appears to run anteriorly. Left: No evidence of clot seen in the Left GSV.  *See table(s) above for measurements and observations. Diagnosing physician: [name] MD Electronically signed by [name] MD on 07/19/2023 at 5:47:47 PM.    Final    PERIPHERAL VASCULAR CATHETERIZATION  Result Date: 07/13/2023 See surgical note for result.    Assessment/Plan 1. Atherosclerosis of native arteries of the extremities with ulceration (HCC)  Recommend:  The patient has evidence of severe atherosclerotic changes of both lower extremities associated with ulceration and tissue loss of the right foot.  This represents a limb threatening ischemia and places the patient at a high risk for limb loss.  Angiography has been performed and the situation is not ideal for intervention.  Given this finding open surgical repair is recommended.   Patient should undergo arterial reconstruction, femoral to anterior tibial bypass with vein of the right lower extremity with the hope for limb salvage.  The risks and benefits as well as the alternative therapies was discussed in detail with the patient.  All  questions were answered.  Patient agrees to proceed with open vascular surgical reconstruction.  This is a very complicated situation as there is significant disease in all tibials and he does have some anomalous anatomy.  It was a very lengthy discussion with the patient and his family.  The patient will follow up with me in the office after the procedure.    A total of 45 minutes was spent with this patient and greater than 50% was spent in counseling and coordination of care with the patient.  Discussion included the treatment options for vascular disease including indications for surgery and intervention.  Also discussed is the appropriate timing of treatment.  In addition medical therapy was discussed.   2. Coronary artery disease of native artery of native heart with stable angina pectoris (HCC) Continue cardiac and antihypertensive medications as already ordered and reviewed, no changes at this time.  Continue statin as ordered and reviewed, no changes at this time  Nitrates PRN for chest pain  3. Benign essential HTN Continue antihypertensive medications as  already ordered, these medications have been reviewed and there are no changes at this time.  4. Mixed hyperlipidemia Continue statin as ordered and reviewed, no changes at this time  5. Primary osteoarthritis of right knee Continue NSAID medications as already ordered, these medications have been reviewed and there are no changes at this time.  Continued activity and therapy was stressed.    [name], MD  07/25/2023 5:19 PM

## 2023-07-25 NOTE — Progress Notes (Signed)
MRN : [id number]  Juan Watson is a 72 y.o. (04-Oct-1951) male who presents with chief complaint of check circulation.  History of Present Illness:  The patient returns to the office for followup and review status post angiogram with intervention on 07/13/2023.   Procedure: Diagnostic imaging of the right lower extremity demonstrating SFA and popliteal artery occlusions with significant tibial vessel disease  The patient denies any improvement in the lower extremity symptoms. No interval shortening of the patient's claudication distance and his rest pain symptoms continue.  He does have an ulcer associated with a tophaceous deposit of his right great toe.  No new ulcers or wounds have occurred since the last visit.  There have been no significant changes to the patient's overall health care.  No documented history of amaurosis fugax or recent TIA symptoms. There are no recent neurological changes noted. No documented history of DVT, PE or superficial thrombophlebitis. The patient denies recent episodes of angina or shortness of breath.     Current Meds  Medication Sig   acetaminophen (TYLENOL) 500 MG tablet Take 1,000 mg by mouth every 6 (six) hours as needed for mild pain or headache.   amLODipine (NORVASC) 10 MG tablet Take 1 tablet (10 mg total) by mouth daily.   aspirin EC 81 MG tablet Take 81 mg by mouth at bedtime.   chlorthalidone (HYGROTON) 25 MG tablet Take 25 mg by mouth daily.   cloNIDine (CATAPRES) 0.1 MG tablet TAKE 1 TABLET TWICE DAILY (Patient taking differently: Take 0.1 mg by mouth daily.)   diclofenac (VOLTAREN) 75 MG EC tablet Take 1 tablet (75 mg total) by mouth 2 (two) times daily as needed.   Febuxostat 80 MG TABS Take by mouth.   glucose blood test strip Use as instructed   labetalol (NORMODYNE) 200 MG tablet TAKE 1 TABLET TWICE DAILY   magnesium oxide (MAG-OX) 400 MG tablet Take 400 mg by  mouth daily.   metFORMIN (GLUCOPHAGE) 500 MG tablet Take 2 tablets (1,000 mg total) by mouth 2 (two) times daily with a meal.   MOUNJARO 7.5 MG/0.5ML Pen Inject into the skin.   pantoprazole (PROTONIX) 40 MG tablet Take 40 mg by mouth daily.   pregabalin (LYRICA) 50 MG capsule Take 50 mg by mouth at bedtime.   rosuvastatin (CRESTOR) 10 MG tablet Take 1 tablet (10 mg total) by mouth daily.   telmisartan (MICARDIS) 80 MG tablet Take 80 mg by mouth daily with supper.     Past Medical History:  Diagnosis Date   Acquired flat foot 11/15/2015   Arthritis of knee, degenerative 04/15/2015   BPH (benign prostatic hyperplasia)    Chewing tobacco nicotine dependence without complication 08/10/2020   Cholecystitis 06/28/2017   Diabetes mellitus without complication (HCC)    Diabetes mellitus, type 2 (HCC) 12/13/2002   Overview:  DIET CONTROLLED    Disease of nail 07/16/2011   Encounter for screening for malignant neoplasm of prostate 10/29/2014   Essential (primary) hypertension 10/17/2010   Familial aortic aneurysm 03/26/2014   Family history of cardiovascular disease 03/26/2014   Gastro-esophageal reflux disease without  esophagitis 08/11/2013   Generalized OA 08/11/2013   Gout 03/26/2014   Heart murmur    HOH (hard of hearing)    aids   Motion sickness    "sea sick"   Primary osteoarthritis of right knee 04/14/2015   Pure hypercholesterolemia 12/13/2002   Skin lesion 03/11/2015    Past Surgical History:  Procedure Laterality Date   BACK SURGERY  [age]   CARDIAC CATHETERIZATION  70's   pericarditis   CATARACT EXTRACTION W/PHACO Right 03/23/2016   Procedure: CATARACT EXTRACTION PHACO AND INTRAOCULAR LENS PLACEMENT (IOC);  Surgeon: Sallee Lange, MD;  Location: [facility];  Service: Ophthalmology;  Laterality: Right;  [place] 01:31    CATARACT EXTRACTION W/PHACO Left 10/23/2019   Procedure: CATARACT EXTRACTION PHACO AND INTRAOCULAR LENS PLACEMENT (IOC) LEFT DIABETIC TORIC LENS 5.82,   00:44.1;   Surgeon: Nevada Crane, MD;  [facility];  Service: Ophthalmology;  Laterality: Left;  Diabetic - oral meds   CHOLECYSTECTOMY N/A 06/29/2017   Procedure: LAPAROSCOPIC CHOLECYSTECTOMY;  Surgeon: Henrene Dodge, MD;  Location: [facility];  Service: General;  Laterality: N/A;   COLONOSCOPY WITH PROPOFOL N/A 10/06/2017   Procedure: COLONOSCOPY WITH PROPOFOL;  Surgeon: Scot Jun, MD;  Location: [facility];  Service: Endoscopy;  Laterality: N/A;   CORONARY STENT INTERVENTION N/A 04/11/2020   Procedure: CORONARY STENT INTERVENTION;  Surgeon: Alwyn Pea, MD;  Location: [facility];  Service: Cardiovascular;  Laterality: N/A;   HERNIA REPAIR  11,   umbilical x2   KNEE ARTHROSCOPY Right 12/20/2017   Procedure: ARTHROSCOPY KNEE EXTENSIVE SYNOVECTOMY AND LYSIS OF ADHESIONS;  Surgeon: Donato Heinz, MD;  Location: [facility];  Service: Orthopedics;  Laterality: Right;   KNEE DEBRIDEMENT Left    tendon   LEFT HEART CATH AND CORONARY ANGIOGRAPHY Left 04/11/2020   Procedure: LEFT HEART CATH AND CORONARY ANGIOGRAPHY;  Surgeon: Lamar Blinks, MD;  Location: [facility];  Service: Cardiovascular;  Laterality: Left;   LOWER EXTREMITY ANGIOGRAPHY Left 09/24/2020   Procedure: LOWER EXTREMITY ANGIOGRAPHY;  Surgeon: Renford Dills, MD;  Location: [facility];  Service: Cardiovascular;  Laterality: Left;   LOWER EXTREMITY ANGIOGRAPHY Right 07/13/2023   Procedure: Lower Extremity Angiography;  Surgeon: Renford Dills, MD;  Location: [facility];  Service: Cardiovascular;  Laterality: Right;   TONSILLECTOMY  72   TOTAL KNEE ARTHROPLASTY Right 04/15/2015   Procedure: TOTAL KNEE ARTHROPLASTY;  Surgeon: Gean Birchwood, MD;  Location: [facility];  Service: Orthopedics;  Laterality: Right;   TRANSURETHRAL RESECTION OF PROSTATE  ?    Social History Social History   Tobacco Use   Smoking status: Former    Current packs/day: 0.00    Average  packs/day: 2.0 packs/day for 25.0 years (50.0 ttl pk-yrs)    Types: Cigarettes    Start date: 07/16/1967    Quit date: 07/15/1992    Years since quitting: 31.0   Smokeless tobacco: Current    Types: Chew  Vaping Use   Vaping status: Never Used  Substance Use Topics   Alcohol use: No   Drug use: No    Family History Family History  Problem Relation Age of Onset   Multiple myeloma Mother    Aneurysm Father    Aneurysm Brother    Nephrolithiasis Neg Hx    Bladder Cancer Neg Hx    Prostate cancer Neg Hx    Kidney cancer Neg Hx     Allergies  Allergen Reactions   Bee  Venom Shortness Of Breath and Swelling   Sulfa Antibiotics Swelling    Tongue swelling   Ben Gay Ultra [Menthol] Other (See Comments)    Whelts and redness of skin     REVIEW OF SYSTEMS (Negative unless checked)  Constitutional: [] Weight loss  [] Fever  [] Chills Cardiac: [] Chest pain   [] Chest pressure   [] Palpitations   [] Shortness of breath when laying flat   [] Shortness of breath with exertion. Vascular:  [x] Pain in legs with walking   [] Pain in legs at rest  [] History of DVT   [] Phlebitis   [] Swelling in legs   [] Varicose veins   [] Non-healing ulcers Pulmonary:   [] Uses home oxygen   [] Productive cough   [] Hemoptysis   [] Wheeze  [] COPD   [] Asthma Neurologic:  [] Dizziness   [] Seizures   [] History of stroke   [] History of TIA  [] Aphasia   [] Vissual changes   [] Weakness or numbness in arm   [] Weakness or numbness in leg Musculoskeletal:   [] Joint swelling   [] Joint pain   [] Low back pain Hematologic:  [] Easy bruising  [] Easy bleeding   [] Hypercoagulable state   [] Anemic Gastrointestinal:  [] Diarrhea   [] Vomiting  [] Gastroesophageal reflux/heartburn   [] Difficulty swallowing. Genitourinary:  [] Chronic kidney disease   [] Difficult urination  [] Frequent urination   [] Blood in urine Skin:  [] Rashes   [] Ulcers  Psychological:  [] History of anxiety   []  History of major depression.  Physical Examination  Vitals:    07/19/23 1623  BP: 129/64  Pulse: 63  Resp: 18  Weight: 228 lb (103.4 kg)  Height: 5\' 7"  (1.702 m)   Body mass index is 35.71 kg/m. Gen: WD/WN, NAD Head: [place]/AT, No temporalis wasting.  Ear/Nose/Throat: Hearing grossly intact, nares w/o erythema or drainage Eyes: PER, EOMI, sclera nonicteric.  Neck: Supple, no masses.  No bruit or JVD.  Pulmonary:  Good air movement, no audible wheezing, no use of accessory muscles.  Cardiac: RRR, normal S1, S2, no Murmurs. Vascular:  mild trophic changes, small open wound associated with the tophaceous deposit of the right great toe uninfected Vessel Right Left  Radial Palpable Palpable  PT Not Palpable Not Palpable  DP Not Palpable Not Palpable  Gastrointestinal: soft, non-distended. No guarding/no peritoneal signs.  Musculoskeletal: M/S 5/5 throughout.  No visible deformity.  Neurologic: CN 2-12 intact. Pain and light touch intact in extremities.  Symmetrical.  Speech is fluent. Motor exam as listed above. Psychiatric: Judgment intact, Mood & affect appropriate for pt's clinical situation. Dermatologic: No rashes + ulcers noted.  No changes consistent with cellulitis.   CBC Lab Results  Component Value Date   WBC 7.6 12/02/2020   HGB 13.6 12/02/2020   HCT 40.2 12/02/2020   MCV 81.4 12/02/2020   PLT 214.0 12/02/2020    BMET    Component Value Date/Time   NA 139 12/02/2020 0812   K 4.3 12/02/2020 0812   CL 101 12/02/2020 0812   CO2 28 12/02/2020 0812   GLUCOSE 175 (H) 12/02/2020 0812   BUN 17 07/13/2023 0838   CREATININE 0.97 07/13/2023 0838   CALCIUM 9.8 12/02/2020 0812   GFRNONAA >60 07/13/2023 0838   GFRAA >60 02/13/2020 1809   Estimated Creatinine Clearance: 78.9 mL/min (by C-G formula based on SCr of 0.97 mg/dL).  COAG Lab Results  Component Value Date   INR 0.96 04/05/2015    Radiology VAS [place] LOWER EXT SAPHENOUS VEIN MAPPING  Result Date: 07/19/2023 LOWER EXTREMITY VEIN MAPPING Patient Name:  Juan Watson  Date  of Exam:   07/19/2023 Medical Rec #: [id number]       Accession #:    [id number] Date of Birth: March 12, 1951       Patient Gender: M Patient Age:   71 years Exam Location:  [facility] Procedure:      VAS [place] LOWER EXTREMITY SAPHENOUS VEIN MAPPING Referring Phys: [name] --------------------------------------------------------------------------------  Other Indications: Pre-op  Performing Technologist: Debbe Bales RVS  Examination Guidelines: A complete evaluation includes B-mode imaging, spectral Doppler, color Doppler, and power Doppler as needed of all accessible portions of each vessel. Bilateral testing is considered an integral part of a complete examination. Limited examinations for reoccurring indications may be performed as noted. +---------------+-----------+----------------------+---------------+-----------+   RT Diameter  RT Findings         GSV            LT Diameter  LT Findings      (cm)                                            (cm)                  +---------------+-----------+----------------------+---------------+-----------+      0.66                     Saphenofemoral         0.57                                                   [place]                                  +---------------+-----------+----------------------+---------------+-----------+      0.43                     Proximal thigh         0.44                  +---------------+-----------+----------------------+---------------+-----------+      0.22                       Mid thigh            0.37                  +---------------+-----------+----------------------+---------------+-----------+      0.26                      Distal thigh          0.46                  +---------------+-----------+----------------------+---------------+-----------+      0.29                          Knee              0.34                   +---------------+-----------+----------------------+---------------+-----------+      0.29  Prox calf            0.28                  +---------------+-----------+----------------------+---------------+-----------+      0.28                        Mid calf            0.26                  +---------------+-----------+----------------------+---------------+-----------+      0.32                      Distal calf           0.36                  +---------------+-----------+----------------------+---------------+-----------+      0.70                         Ankle              0.35                  +---------------+-----------+----------------------+---------------+-----------+ Summary:  Right: No evidence of clot seen in the Rt GSV,; Peroneal Artery        distal Calf appears to run anteriorly. Left: No evidence of clot seen in the Left GSV.  *See table(s) above for measurements and observations. Diagnosing physician: [name] MD Electronically signed by [name] MD on 07/19/2023 at 5:47:47 PM.    Final    PERIPHERAL VASCULAR CATHETERIZATION  Result Date: 07/13/2023 See surgical note for result.    Assessment/Plan 1. Atherosclerosis of native arteries of the extremities with ulceration (HCC)  Recommend:  The patient has evidence of severe atherosclerotic changes of both lower extremities associated with ulceration and tissue loss of the right foot.  This represents a limb threatening ischemia and places the patient at a high risk for limb loss.  Angiography has been performed and the situation is not ideal for intervention.  Given this finding open surgical repair is recommended.   Patient should undergo arterial reconstruction, femoral to anterior tibial bypass with vein of the right lower extremity with the hope for limb salvage.  The risks and benefits as well as the alternative therapies was discussed in detail with the patient.  All  questions were answered.  Patient agrees to proceed with open vascular surgical reconstruction.  This is a very complicated situation as there is significant disease in all tibials and he does have some anomalous anatomy.  It was a very lengthy discussion with the patient and his family.  The patient will follow up with me in the office after the procedure.    A total of 45 minutes was spent with this patient and greater than 50% was spent in counseling and coordination of care with the patient.  Discussion included the treatment options for vascular disease including indications for surgery and intervention.  Also discussed is the appropriate timing of treatment.  In addition medical therapy was discussed.   2. Coronary artery disease of native artery of native heart with stable angina pectoris (HCC) Continue cardiac and antihypertensive medications as already ordered and reviewed, no changes at this time.  Continue statin as ordered and reviewed, no changes at this time  Nitrates PRN for chest pain  3. Benign essential HTN Continue antihypertensive medications as  already ordered, these medications have been reviewed and there are no changes at this time.  4. Mixed hyperlipidemia Continue statin as ordered and reviewed, no changes at this time  5. Primary osteoarthritis of right knee Continue NSAID medications as already ordered, these medications have been reviewed and there are no changes at this time.  Continued activity and therapy was stressed.    [name], MD  07/25/2023 5:19 PM

## 2023-08-13 ENCOUNTER — Telehealth (INDEPENDENT_AMBULATORY_CARE_PROVIDER_SITE_OTHER): Payer: Self-pay

## 2023-08-13 NOTE — Telephone Encounter (Signed)
Spoke with the patient and he is scheduled with Dr. Gilda Crease on 08/18/23 for a right Fem-Tib bypass with /vein at [facility]. Pre-op is scheduled on  08/17/23 at 2:00 pm at the [facility]. Pre-surgical instructions were discussed and patient will write them down.

## 2023-08-17 ENCOUNTER — Encounter
Admission: RE | Admit: 2023-08-17 | Discharge: 2023-08-17 | Disposition: A | Discharge status: Home / Self Care | Payer: Medicare PPO | Source: Ambulatory Visit | Attending: Vascular Surgery | Admitting: Vascular Surgery

## 2023-08-17 ENCOUNTER — Other Ambulatory Visit: Payer: Self-pay

## 2023-08-17 ENCOUNTER — Other Ambulatory Visit (INDEPENDENT_AMBULATORY_CARE_PROVIDER_SITE_OTHER): Payer: Self-pay | Admitting: Nurse Practitioner

## 2023-08-17 VITALS — BP 122/60 | HR 59 | Resp 20 | Ht 67.0 in | Wt 230.0 lb

## 2023-08-17 DIAGNOSIS — I7025 Atherosclerosis of native arteries of other extremities with ulceration: Secondary | ICD-10-CM

## 2023-08-17 DIAGNOSIS — I70223 Atherosclerosis of native arteries of extremities with rest pain, bilateral legs: Secondary | ICD-10-CM | POA: Insufficient documentation

## 2023-08-17 DIAGNOSIS — E785 Hyperlipidemia, unspecified: Secondary | ICD-10-CM | POA: Insufficient documentation

## 2023-08-17 DIAGNOSIS — I25118 Atherosclerotic heart disease of native coronary artery with other forms of angina pectoris: Secondary | ICD-10-CM | POA: Insufficient documentation

## 2023-08-17 DIAGNOSIS — Z955 Presence of coronary angioplasty implant and graft: Secondary | ICD-10-CM | POA: Insufficient documentation

## 2023-08-17 DIAGNOSIS — E1159 Type 2 diabetes mellitus with other circulatory complications: Secondary | ICD-10-CM | POA: Insufficient documentation

## 2023-08-17 DIAGNOSIS — Z01812 Encounter for preprocedural laboratory examination: Secondary | ICD-10-CM | POA: Insufficient documentation

## 2023-08-17 DIAGNOSIS — E782 Mixed hyperlipidemia: Secondary | ICD-10-CM

## 2023-08-17 DIAGNOSIS — Z01818 Encounter for other preprocedural examination: Secondary | ICD-10-CM

## 2023-08-17 DIAGNOSIS — I70213 Atherosclerosis of native arteries of extremities with intermittent claudication, bilateral legs: Secondary | ICD-10-CM

## 2023-08-17 HISTORY — DX: Peripheral vascular disease, unspecified: I73.9

## 2023-08-17 HISTORY — DX: Atherosclerotic heart disease of native coronary artery without angina pectoris: I25.10

## 2023-08-17 LAB — SURGICAL PCR SCREEN
MRSA, PCR: NEGATIVE
Staphylococcus aureus: NEGATIVE

## 2023-08-17 LAB — BASIC METABOLIC PANEL
Anion gap: 11 (ref 5–15)
BUN: 22 mg/dL (ref 8–23)
CO2: 24 mmol/L (ref 22–32)
Calcium: 9.2 mg/dL (ref 8.9–10.3)
Chloride: 98 mmol/L (ref 98–111)
Creatinine, Ser: 1.1 mg/dL (ref 0.61–1.24)
GFR, Estimated: >60 mL/min (ref >60)
Glucose, Bld: 111 mg/dL — ABNORMAL HIGH (ref 70–99)
Potassium: 4.1 mmol/L (ref 3.5–5.1)
Sodium: 133 mmol/L — ABNORMAL LOW (ref 135–145)

## 2023-08-17 LAB — CBC
HCT: 36.8 % — ABNORMAL LOW (ref 39.0–52.0)
Hemoglobin: 12.9 g/dL — ABNORMAL LOW (ref 13.0–17.0)
MCH: 29.2 pg (ref 26.0–34.0)
MCHC: 35.1 g/dL (ref 30.0–36.0)
MCV: 83.3 fL (ref 80.0–100.0)
Platelets: 200 10*3/uL (ref 150–400)
RBC: 4.42 MIL/uL (ref 4.22–5.81)
RDW: 12.8 % (ref 11.5–15.5)
WBC: 7.6 10*3/uL (ref 4.0–10.5)
nRBC: 0 % (ref 0.0–0.2)

## 2023-08-17 LAB — TYPE AND SCREEN
ABO/RH(D): A POS
Antibody Screen: NEGATIVE

## 2023-08-17 NOTE — Patient Instructions (Signed)
Your procedure is scheduled on: Wednesday 08/18/23 To find out your arrival time, please call [phone] between 1PM - 3PM on:  Tuesday 08/17/23  Report to the Registration Desk on the 1st floor of the [facility]. FREE Valet parking is available.  If your arrival time is 6:00 am, do not arrive before that time as the [facility] entrance doors do not open until 6:00 am.  REMEMBER: Instructions that are not followed completely may result in serious medical risk, up to and including death; or upon the discretion of your surgeon and anesthesiologist your surgery may need to be rescheduled.  Do not eat food or drink any liquids after midnight the night before surgery.  No gum chewing or hard candies.  One week prior to surgery: Stop Anti-inflammatories (NSAIDS) such as Advil, Aleve, Ibuprofen, Motrin, Naproxen, Naprosyn and Aspirin based products such as Excedrin, Goody's Powder, BC Powder. You may however, continue to take Tylenol if needed for pain up until the day of surgery.  Stop ANY OVER THE COUNTER supplements until after surgery.  Continue taking all prescribed medications with the exception of the following: Mounjaro, Hold tonights dose of Metformin  TAKE ONLY THESE MEDICATIONS THE MORNING OF SURGERY WITH A SIP OF WATER:  amLODipine (NORVASC) 2.5 MG tablet  cloNIDine (CATAPRES) 0.1 MG tablet  labetalol (NORMODYNE) 200 MG tablet  pantoprazole (PROTONIX) 40 MG tablet Antacid (take one the night before and one on the morning of surgery - helps to prevent nausea after surgery.)  No Alcohol for 24 hours before or after surgery.  No Smoking including e-cigarettes for 24 hours before surgery.  No chewable tobacco products for at least 6 hours before surgery.  No nicotine patches on the day of surgery.  Do not use any "recreational" drugs for at least a week (preferably 2 weeks) before your surgery.  Please be advised that the combination of cocaine and anesthesia may have  negative outcomes, up to and including death. If you test positive for cocaine, your surgery will be cancelled.  On the morning of surgery brush your teeth with toothpaste and water, you may rinse your mouth with mouthwash if you wish. Do not swallow any toothpaste or mouthwash.  Use CHG Soap or wipes as directed on instruction sheet.  Do not wear lotions, powders, or perfumes.   Do not shave body hair from the neck down 48 hours before surgery.  Wear comfortable clothing (specific to your surgery type) to the hospital.  Do not wear jewelry, make-up, hairpins, clips or nail polish.  For welded (permanent) jewelry: bracelets, anklets, waist bands, etc.  Please have this removed prior to surgery.  If it is not removed, there is a chance that hospital personnel will need to cut it off on the day of surgery. Contact lenses, hearing aids and dentures may not be worn into surgery.  Do not bring valuables to the hospital. [facility] is not responsible for any missing/lost belongings or valuables.   Notify your doctor if there is any change in your medical condition (cold, fever, infection).  If you are being discharged the day of surgery, you will not be allowed to drive home. You will need a responsible individual to drive you home and stay with you for 24 hours after surgery.   If you are taking public transportation, you will need to have a responsible individual with you.  If you are being admitted to the hospital overnight, leave your suitcase in the car. After surgery it  may be brought to your room.  In case of increased patient census, it may be necessary for you, the patient, to continue your postoperative care in the Same Day Surgery department.  After surgery, you can help prevent lung complications by doing breathing exercises.  Take deep breaths and cough every 1-2 hours. Your doctor may order a device called an Incentive Spirometer to help you take deep breaths. When coughing  or sneezing, hold a pillow firmly against your incision with both hands. This is called "splinting." Doing this helps protect your incision. It also decreases belly discomfort.  Surgery Visitation Policy:  Patients undergoing a surgery or procedure may have two family members or support persons with them as long as the person is not COVID-19 positive or experiencing its symptoms.   Inpatient Visitation:    Visiting hours are 7 a.m. to 8 p.m. Up to four visitors are allowed at one time in a patient room. The visitors may rotate out with other people during the day. One designated support person (adult) may remain overnight.  Please call the Pre-admissions Testing Dept. at [phone] if you have any questions about these instructions.     Preparing for Surgery with CHLORHEXIDINE GLUCONATE (CHG) Soap  Chlorhexidine Gluconate (CHG) Soap  o An antiseptic cleaner that kills germs and bonds with the skin to continue killing germs even after washing  o Used for showering the night before surgery and morning of surgery  Before surgery, you can play an important role by reducing the number of germs on your skin.  CHG (Chlorhexidine gluconate) soap is an antiseptic cleanser which kills germs and bonds with the skin to continue killing germs even after washing.  Please do not use if you have an allergy to CHG or antibacterial soaps. If your skin becomes reddened/irritated stop using the CHG.  1. Shower the NIGHT BEFORE SURGERY and the MORNING OF SURGERY with CHG soap.  2. If you choose to wash your hair, wash your hair first as usual with your normal shampoo.  3. After shampooing, rinse your hair and body thoroughly to remove the shampoo.  4. Use CHG as you would any other liquid soap. You can apply CHG directly to the skin and wash gently with a scrungie or a clean washcloth.  5. Apply the CHG soap to your body only from the neck down. Do not use on open wounds or open sores. Avoid  contact with your eyes, ears, mouth, and genitals (private parts). Wash face and genitals (private parts) with your normal soap.  6. Wash thoroughly, paying special attention to the area where your surgery will be performed.  7. Thoroughly rinse your body with warm water.  8. Do not shower/wash with your normal soap after using and rinsing off the CHG soap.  9. Pat yourself dry with a clean towel.  10. Wear clean pajamas to bed the night before surgery.  12. Place clean sheets on your bed the night of your first shower and do not sleep with pets.  13. Shower again with the CHG soap on the day of surgery prior to arriving at the hospital.  14. Do not apply any deodorants/lotions/powders.  15. Please wear clean clothes to the hospital.

## 2023-08-18 ENCOUNTER — Inpatient Hospital Stay: Payer: Medicare PPO | Admitting: Urgent Care

## 2023-08-18 ENCOUNTER — Inpatient Hospital Stay: Payer: Medicare PPO

## 2023-08-18 ENCOUNTER — Inpatient Hospital Stay: Payer: Self-pay | Admitting: Urgent Care

## 2023-08-18 ENCOUNTER — Encounter
Admission: RE | Disposition: A | Discharge status: Home / Self Care | Payer: Self-pay | Source: Ambulatory Visit | Attending: Vascular Surgery

## 2023-08-18 ENCOUNTER — Other Ambulatory Visit: Payer: Self-pay

## 2023-08-18 ENCOUNTER — Inpatient Hospital Stay
Admission: RE | Admit: 2023-08-18 | Discharge: 2023-08-23 | DRG: 254 | Disposition: A | Discharge status: Home Health | Payer: Medicare PPO | Source: Ambulatory Visit | Attending: Vascular Surgery | Admitting: Vascular Surgery

## 2023-08-18 ENCOUNTER — Encounter: Payer: Self-pay | Admitting: Vascular Surgery

## 2023-08-18 DIAGNOSIS — M1711 Unilateral primary osteoarthritis, right knee: Secondary | ICD-10-CM | POA: Diagnosis present

## 2023-08-18 DIAGNOSIS — L97519 Non-pressure chronic ulcer of other part of right foot with unspecified severity: Secondary | ICD-10-CM

## 2023-08-18 DIAGNOSIS — I25118 Atherosclerotic heart disease of native coronary artery with other forms of angina pectoris: Secondary | ICD-10-CM | POA: Diagnosis present

## 2023-08-18 DIAGNOSIS — K219 Gastro-esophageal reflux disease without esophagitis: Secondary | ICD-10-CM | POA: Diagnosis present

## 2023-08-18 DIAGNOSIS — E1151 Type 2 diabetes mellitus with diabetic peripheral angiopathy without gangrene: Secondary | ICD-10-CM | POA: Diagnosis present

## 2023-08-18 DIAGNOSIS — N400 Enlarged prostate without lower urinary tract symptoms: Secondary | ICD-10-CM | POA: Diagnosis present

## 2023-08-18 DIAGNOSIS — Z91030 Bee allergy status: Secondary | ICD-10-CM

## 2023-08-18 DIAGNOSIS — Z807 Family history of other malignant neoplasms of lymphoid, hematopoietic and related tissues: Secondary | ICD-10-CM

## 2023-08-18 DIAGNOSIS — Z720 Tobacco use: Secondary | ICD-10-CM

## 2023-08-18 DIAGNOSIS — Z7985 Long-term (current) use of injectable non-insulin antidiabetic drugs: Secondary | ICD-10-CM

## 2023-08-18 DIAGNOSIS — Z7984 Long term (current) use of oral hypoglycemic drugs: Secondary | ICD-10-CM

## 2023-08-18 DIAGNOSIS — Z961 Presence of intraocular lens: Secondary | ICD-10-CM | POA: Diagnosis present

## 2023-08-18 DIAGNOSIS — M2141 Flat foot [pes planus] (acquired), right foot: Secondary | ICD-10-CM | POA: Diagnosis present

## 2023-08-18 DIAGNOSIS — R011 Cardiac murmur, unspecified: Secondary | ICD-10-CM | POA: Diagnosis present

## 2023-08-18 DIAGNOSIS — H9190 Unspecified hearing loss, unspecified ear: Secondary | ICD-10-CM | POA: Diagnosis present

## 2023-08-18 DIAGNOSIS — Z8249 Family history of ischemic heart disease and other diseases of the circulatory system: Secondary | ICD-10-CM

## 2023-08-18 DIAGNOSIS — I10 Essential (primary) hypertension: Secondary | ICD-10-CM | POA: Diagnosis present

## 2023-08-18 DIAGNOSIS — I70235 Atherosclerosis of native arteries of right leg with ulceration of other part of foot: Secondary | ICD-10-CM | POA: Diagnosis present

## 2023-08-18 DIAGNOSIS — M1A0711 Idiopathic chronic gout, right ankle and foot, with tophus (tophi): Secondary | ICD-10-CM

## 2023-08-18 DIAGNOSIS — Z882 Allergy status to sulfonamides status: Secondary | ICD-10-CM | POA: Diagnosis not present

## 2023-08-18 DIAGNOSIS — M1A9XX1 Chronic gout, unspecified, with tophus (tophi): Secondary | ICD-10-CM | POA: Diagnosis present

## 2023-08-18 DIAGNOSIS — Z79890 Hormone replacement therapy (postmenopausal): Secondary | ICD-10-CM

## 2023-08-18 DIAGNOSIS — Z9841 Cataract extraction status, right eye: Secondary | ICD-10-CM

## 2023-08-18 DIAGNOSIS — Z888 Allergy status to other drugs, medicaments and biological substances status: Secondary | ICD-10-CM

## 2023-08-18 DIAGNOSIS — Z7982 Long term (current) use of aspirin: Secondary | ICD-10-CM | POA: Diagnosis not present

## 2023-08-18 DIAGNOSIS — I70229 Atherosclerosis of native arteries of extremities with rest pain, unspecified extremity: Secondary | ICD-10-CM | POA: Diagnosis present

## 2023-08-18 DIAGNOSIS — Z9089 Acquired absence of other organs: Secondary | ICD-10-CM

## 2023-08-18 DIAGNOSIS — Z9079 Acquired absence of other genital organ(s): Secondary | ICD-10-CM

## 2023-08-18 DIAGNOSIS — Z8679 Personal history of other diseases of the circulatory system: Secondary | ICD-10-CM

## 2023-08-18 DIAGNOSIS — Z7902 Long term (current) use of antithrombotics/antiplatelets: Secondary | ICD-10-CM | POA: Diagnosis not present

## 2023-08-18 DIAGNOSIS — Z96651 Presence of right artificial knee joint: Secondary | ICD-10-CM | POA: Diagnosis present

## 2023-08-18 DIAGNOSIS — Z955 Presence of coronary angioplasty implant and graft: Secondary | ICD-10-CM | POA: Diagnosis not present

## 2023-08-18 DIAGNOSIS — E782 Mixed hyperlipidemia: Secondary | ICD-10-CM | POA: Diagnosis present

## 2023-08-18 DIAGNOSIS — K5900 Constipation, unspecified: Secondary | ICD-10-CM | POA: Diagnosis not present

## 2023-08-18 DIAGNOSIS — Z974 Presence of external hearing-aid: Secondary | ICD-10-CM

## 2023-08-18 DIAGNOSIS — Z9842 Cataract extraction status, left eye: Secondary | ICD-10-CM

## 2023-08-18 DIAGNOSIS — Z79899 Other long term (current) drug therapy: Secondary | ICD-10-CM

## 2023-08-18 DIAGNOSIS — E1159 Type 2 diabetes mellitus with other circulatory complications: Secondary | ICD-10-CM

## 2023-08-18 DIAGNOSIS — Z9049 Acquired absence of other specified parts of digestive tract: Secondary | ICD-10-CM

## 2023-08-18 DIAGNOSIS — Z9889 Other specified postprocedural states: Secondary | ICD-10-CM

## 2023-08-18 HISTORY — PX: ENDARTERECTOMY POPLITEAL: SHX5806

## 2023-08-18 HISTORY — PX: ENDARTERECTOMY FEMORAL: SHX5804

## 2023-08-18 HISTORY — DX: Atherosclerosis of native arteries of extremities with rest pain, unspecified extremity: I70.229

## 2023-08-18 HISTORY — PX: FEMORAL-TIBIAL BYPASS GRAFT: SHX938

## 2023-08-18 HISTORY — PX: ENDARTERECTOMY TIBIOPERONEAL: SHX5808

## 2023-08-18 LAB — CBC
HCT: 34.3 % — ABNORMAL LOW (ref 39.0–52.0)
Hemoglobin: 11.6 g/dL — ABNORMAL LOW (ref 13.0–17.0)
MCH: 29 pg (ref 26.0–34.0)
MCHC: 33.8 g/dL (ref 30.0–36.0)
MCV: 85.8 fL (ref 80.0–100.0)
Platelets: 198 10*3/uL (ref 150–400)
RBC: 4 MIL/uL — ABNORMAL LOW (ref 4.22–5.81)
RDW: 13 % (ref 11.5–15.5)
WBC: 19.2 10*3/uL — ABNORMAL HIGH (ref 4.0–10.5)
nRBC: 0 % (ref 0.0–0.2)

## 2023-08-18 LAB — CREATININE, SERUM
Creatinine, Ser: 1.07 mg/dL (ref 0.61–1.24)
GFR, Estimated: >60 mL/min (ref >60)

## 2023-08-18 LAB — GLUCOSE, CAPILLARY
Glucose-Capillary: 131 mg/dL — ABNORMAL HIGH (ref 70–99)
Glucose-Capillary: 170 mg/dL — ABNORMAL HIGH (ref 70–99)
Glucose-Capillary: 172 mg/dL — ABNORMAL HIGH (ref 70–99)

## 2023-08-18 LAB — MRSA NEXT GEN BY PCR, NASAL: MRSA by PCR Next Gen: NOT DETECTED

## 2023-08-18 SURGERY — CREATION, BYPASS, ARTERIAL, FEMORAL TO TIBIAL, USING GRAFT
Anesthesia: General | Laterality: Right

## 2023-08-18 MED ORDER — POTASSIUM CHLORIDE CRYS ER 20 MEQ PO TBCR: 20.0000 meq | EXTENDED_RELEASE_TABLET | Freq: Every day | ORAL | Status: DC | PRN

## 2023-08-18 MED ORDER — MIDAZOLAM HCL 2 MG/2ML IJ SOLN
INTRAMUSCULAR | Status: DC | PRN
  Administered 2023-08-18: 2 mg via INTRAVENOUS

## 2023-08-18 MED ORDER — FENTANYL CITRATE (PF) 100 MCG/2ML IJ SOLN
INTRAMUSCULAR | Status: AC
  Filled 2023-08-18: qty 2

## 2023-08-18 MED ORDER — HYDROMORPHONE HCL 1 MG/ML IJ SOLN
INTRAMUSCULAR | Status: AC
  Filled 2023-08-18: qty 1

## 2023-08-18 MED ORDER — ALBUMIN HUMAN 5 % IV SOLN
12.5000 g | INTRAVENOUS | Status: AC
  Administered 2023-08-18: 12.5 g via INTRAVENOUS

## 2023-08-18 MED ORDER — CLONIDINE HCL 0.1 MG PO TABS
0.1000 mg | ORAL_TABLET | Freq: Two times a day (BID) | ORAL | Status: DC
  Administered 2023-08-18 – 2023-08-21 (×4): 0.1 mg via ORAL
  Filled 2023-08-18 (×7): qty 1

## 2023-08-18 MED ORDER — DEXAMETHASONE SODIUM PHOSPHATE 10 MG/ML IJ SOLN
INTRAMUSCULAR | Status: AC
  Filled 2023-08-18: qty 1

## 2023-08-18 MED ORDER — ORAL CARE MOUTH RINSE: 15.0000 mL | Freq: Once | OROMUCOSAL | Status: DC

## 2023-08-18 MED ORDER — CHLORHEXIDINE GLUCONATE CLOTH 2 % EX PADS: 6.0000 | MEDICATED_PAD | Freq: Once | CUTANEOUS | Status: DC

## 2023-08-18 MED ORDER — HYDROMORPHONE HCL 1 MG/ML IJ SOLN
1.0000 mg | Freq: Once | INTRAMUSCULAR | Status: AC | PRN
  Administered 2023-08-18: 1 mg via INTRAVENOUS
  Filled 2023-08-18: qty 1

## 2023-08-18 MED ORDER — AMLODIPINE BESYLATE 5 MG PO TABS
2.5000 mg | ORAL_TABLET | Freq: Every day | ORAL | Status: DC
  Administered 2023-08-20: 2.5 mg via ORAL
  Filled 2023-08-18 (×3): qty 1

## 2023-08-18 MED ORDER — INSULIN ASPART 100 UNIT/ML IJ SOLN
0.0000 [IU] | Freq: Three times a day (TID) | INTRAMUSCULAR | Status: DC
  Administered 2023-08-19 (×2): 3 [IU] via SUBCUTANEOUS
  Administered 2023-08-19 – 2023-08-20 (×3): 2 [IU] via SUBCUTANEOUS
  Administered 2023-08-20 – 2023-08-22 (×4): 3 [IU] via SUBCUTANEOUS
  Administered 2023-08-22: 2 [IU] via SUBCUTANEOUS
  Administered 2023-08-23: 3 [IU] via SUBCUTANEOUS
  Filled 2023-08-18 (×11): qty 1

## 2023-08-18 MED ORDER — ONDANSETRON HCL 4 MG/2ML IJ SOLN
INTRAMUSCULAR | Status: AC
  Filled 2023-08-18: qty 2

## 2023-08-18 MED ORDER — KETAMINE HCL 10 MG/ML IJ SOLN
INTRAMUSCULAR | Status: DC | PRN
Start: 2023-08-18 — End: 2023-08-18
  Administered 2023-08-18 (×2): 20 mg via INTRAVENOUS

## 2023-08-18 MED ORDER — CEFAZOLIN SODIUM-DEXTROSE 2-4 GM/100ML-% IV SOLN
2.0000 g | INTRAVENOUS | Status: AC
  Administered 2023-08-18 (×2): 2 g via INTRAVENOUS

## 2023-08-18 MED ORDER — VISTASEAL 10 ML SINGLE DOSE KIT
PACK | CUTANEOUS | Status: DC | PRN
Start: 2023-08-18 — End: 2023-08-18
  Administered 2023-08-18: 10 mL via TOPICAL

## 2023-08-18 MED ORDER — SODIUM CHLORIDE 0.9 % IV SOLN: 500.0000 mL | Freq: Once | INTRAVENOUS | Status: DC | PRN

## 2023-08-18 MED ORDER — MORPHINE SULFATE (PF) 2 MG/ML IV SOLN
2.0000 mg | INTRAVENOUS | Status: DC | PRN
  Administered 2023-08-19: 4 mg via INTRAVENOUS
  Filled 2023-08-18: qty 2

## 2023-08-18 MED ORDER — PROPOFOL 10 MG/ML IV BOLUS
INTRAVENOUS | Status: AC
  Filled 2023-08-18: qty 40

## 2023-08-18 MED ORDER — SENNOSIDES-DOCUSATE SODIUM 8.6-50 MG PO TABS
1.0000 | ORAL_TABLET | Freq: Every evening | ORAL | Status: DC | PRN
  Administered 2023-08-21 (×2): 1 via ORAL
  Filled 2023-08-18 (×3): qty 1

## 2023-08-18 MED ORDER — HYDROMORPHONE HCL 1 MG/ML IJ SOLN
INTRAMUSCULAR | Status: DC | PRN
Start: 2023-08-18 — End: 2023-08-18
  Administered 2023-08-18 (×3): .25 mg via INTRAVENOUS

## 2023-08-18 MED ORDER — METOPROLOL TARTRATE 5 MG/5ML IV SOLN: 2.0000 mg | INTRAVENOUS | Status: DC | PRN

## 2023-08-18 MED ORDER — LABETALOL HCL 5 MG/ML IV SOLN: 10.0000 mg | INTRAVENOUS | Status: DC | PRN

## 2023-08-18 MED ORDER — OXYCODONE HCL 5 MG/5ML PO SOLN: 5.0000 mg | Freq: Once | ORAL | Status: DC | PRN

## 2023-08-18 MED ORDER — PHENYLEPHRINE 80 MCG/ML (10ML) SYRINGE FOR IV PUSH (FOR BLOOD PRESSURE SUPPORT)
PREFILLED_SYRINGE | INTRAVENOUS | Status: AC
  Filled 2023-08-18: qty 10

## 2023-08-18 MED ORDER — DEXAMETHASONE SODIUM PHOSPHATE 10 MG/ML IJ SOLN
INTRAMUSCULAR | Status: DC | PRN
  Administered 2023-08-18: 5 mg via INTRAVENOUS

## 2023-08-18 MED ORDER — SODIUM CHLORIDE 0.9 % IV SOLN: INTRAVENOUS | Status: DC

## 2023-08-18 MED ORDER — ALBUMIN HUMAN 5 % IV SOLN
INTRAVENOUS | Status: AC
  Filled 2023-08-18: qty 500

## 2023-08-18 MED ORDER — ACETAMINOPHEN 10 MG/ML IV SOLN
INTRAVENOUS | Status: DC | PRN
  Administered 2023-08-18: 1000 mg via INTRAVENOUS

## 2023-08-18 MED ORDER — ACETAMINOPHEN 650 MG RE SUPP: 325.0000 mg | RECTAL | Status: DC | PRN

## 2023-08-18 MED ORDER — PHENYLEPHRINE HCL (PRESSORS) 10 MG/ML IV SOLN
INTRAVENOUS | Status: DC | PRN
  Administered 2023-08-18: 160 ug via INTRAVENOUS
  Administered 2023-08-18: 80 ug via INTRAVENOUS
  Administered 2023-08-18 (×2): 160 ug via INTRAVENOUS

## 2023-08-18 MED ORDER — DOCUSATE SODIUM 100 MG PO CAPS
100.0000 mg | ORAL_CAPSULE | Freq: Every day | ORAL | Status: DC
  Administered 2023-08-19 – 2023-08-23 (×5): 100 mg via ORAL
  Filled 2023-08-18 (×5): qty 1

## 2023-08-18 MED ORDER — CHLORTHALIDONE 25 MG PO TABS
25.0000 mg | ORAL_TABLET | Freq: Every day | ORAL | Status: DC
  Administered 2023-08-18 – 2023-08-20 (×3): 25 mg via ORAL
  Filled 2023-08-18 (×6): qty 1

## 2023-08-18 MED ORDER — ONDANSETRON HCL 4 MG/2ML IJ SOLN
4.0000 mg | Freq: Four times a day (QID) | INTRAMUSCULAR | Status: DC | PRN
  Administered 2023-08-19: 4 mg via INTRAVENOUS
  Filled 2023-08-18: qty 2

## 2023-08-18 MED ORDER — ACETAMINOPHEN 10 MG/ML IV SOLN
INTRAVENOUS | Status: AC
  Filled 2023-08-18: qty 100

## 2023-08-18 MED ORDER — SUGAMMADEX SODIUM 200 MG/2ML IV SOLN
INTRAVENOUS | Status: DC | PRN
  Administered 2023-08-18: 200 mg via INTRAVENOUS

## 2023-08-18 MED ORDER — LABETALOL HCL 100 MG PO TABS
200.0000 mg | ORAL_TABLET | Freq: Two times a day (BID) | ORAL | Status: DC
  Administered 2023-08-19 – 2023-08-22 (×4): 200 mg via ORAL
  Filled 2023-08-18 (×2): qty 2
  Filled 2023-08-18: qty 1
  Filled 2023-08-18 (×2): qty 2

## 2023-08-18 MED ORDER — HEPARIN 30,000 UNITS/1000 ML (OHS) CELLSAVER SOLUTION
Status: AC | PRN
  Administered 2023-08-18: 1

## 2023-08-18 MED ORDER — IRBESARTAN 150 MG PO TABS
150.0000 mg | ORAL_TABLET | Freq: Every day | ORAL | Status: DC
  Administered 2023-08-19 – 2023-08-20 (×2): 150 mg via ORAL
  Filled 2023-08-18 (×3): qty 1

## 2023-08-18 MED ORDER — MIDAZOLAM HCL 2 MG/2ML IJ SOLN
INTRAMUSCULAR | Status: AC
  Filled 2023-08-18: qty 2

## 2023-08-18 MED ORDER — ONDANSETRON HCL 4 MG/2ML IJ SOLN
4.0000 mg | Freq: Four times a day (QID) | INTRAMUSCULAR | Status: DC | PRN
  Administered 2023-08-18: 4 mg via INTRAVENOUS
  Filled 2023-08-18: qty 2

## 2023-08-18 MED ORDER — NITROGLYCERIN IN D5W 200-5 MCG/ML-% IV SOLN: 5.0000 ug/min | INTRAVENOUS | Status: DC

## 2023-08-18 MED ORDER — FENTANYL CITRATE (PF) 100 MCG/2ML IJ SOLN: 25.0000 ug | INTRAMUSCULAR | Status: DC | PRN

## 2023-08-18 MED ORDER — SODIUM CHLORIDE 0.9 % IR SOLN
Status: DC | PRN
Start: 2023-08-18 — End: 2023-08-18
  Administered 2023-08-18: 1000 mL

## 2023-08-18 MED ORDER — HEMOSTATIC AGENTS (NO CHARGE) OPTIME
TOPICAL | Status: DC | PRN
  Administered 2023-08-18: 3 via TOPICAL

## 2023-08-18 MED ORDER — CHLORHEXIDINE GLUCONATE 0.12 % MT SOLN: 15.0000 mL | Freq: Once | OROMUCOSAL | Status: DC

## 2023-08-18 MED ORDER — ALUM & MAG HYDROXIDE-SIMETH 200-200-20 MG/5ML PO SUSP: 15.0000 mL | ORAL | Status: DC | PRN

## 2023-08-18 MED ORDER — PHENYLEPHRINE HCL-NACL 20-0.9 MG/250ML-% IV SOLN
INTRAVENOUS | Status: DC | PRN
  Administered 2023-08-18: 30 ug/min via INTRAVENOUS
  Administered 2023-08-18: 40 ug/min via INTRAVENOUS

## 2023-08-18 MED ORDER — HYDRALAZINE HCL 20 MG/ML IJ SOLN: 5.0000 mg | INTRAMUSCULAR | Status: DC | PRN

## 2023-08-18 MED ORDER — ROCURONIUM BROMIDE 10 MG/ML (PF) SYRINGE
PREFILLED_SYRINGE | INTRAVENOUS | Status: AC
  Filled 2023-08-18: qty 10

## 2023-08-18 MED ORDER — PHENYLEPHRINE HCL-NACL 20-0.9 MG/250ML-% IV SOLN
INTRAVENOUS | Status: AC
  Filled 2023-08-18: qty 250

## 2023-08-18 MED ORDER — VISTASEAL 10 ML SINGLE DOSE KIT
PACK | CUTANEOUS | Status: AC
  Filled 2023-08-18: qty 10

## 2023-08-18 MED ORDER — IOHEXOL 180 MG/ML  SOLN
INTRAMUSCULAR | Status: DC | PRN
Start: 2023-08-18 — End: 2023-08-18
  Administered 2023-08-18: 20 mL

## 2023-08-18 MED ORDER — ACETAMINOPHEN 325 MG PO TABS
325.0000 mg | ORAL_TABLET | ORAL | Status: DC | PRN
  Administered 2023-08-22: 650 mg via ORAL
  Filled 2023-08-18: qty 2

## 2023-08-18 MED ORDER — SORBITOL 70 % SOLN
30.0000 mL | Freq: Every day | Status: DC | PRN
  Administered 2023-08-22: 30 mL via ORAL
  Filled 2023-08-18 (×2): qty 30

## 2023-08-18 MED ORDER — EPHEDRINE SULFATE (PRESSORS) 50 MG/ML IJ SOLN
INTRAMUSCULAR | Status: DC | PRN
  Administered 2023-08-18 (×3): 5 mg via INTRAVENOUS

## 2023-08-18 MED ORDER — KETAMINE HCL 50 MG/5ML IJ SOSY
PREFILLED_SYRINGE | INTRAMUSCULAR | Status: AC
  Filled 2023-08-18: qty 5

## 2023-08-18 MED ORDER — ROCURONIUM BROMIDE 100 MG/10ML IV SOLN
INTRAVENOUS | Status: DC | PRN
  Administered 2023-08-18: 20 mg via INTRAVENOUS
  Administered 2023-08-18: 70 mg via INTRAVENOUS
  Administered 2023-08-18: 30 mg via INTRAVENOUS
  Administered 2023-08-18 (×4): 20 mg via INTRAVENOUS

## 2023-08-18 MED ORDER — ROSUVASTATIN CALCIUM 10 MG PO TABS
10.0000 mg | ORAL_TABLET | Freq: Every day | ORAL | Status: DC
  Administered 2023-08-19 – 2023-08-22 (×5): 10 mg via ORAL
  Filled 2023-08-18 (×6): qty 1

## 2023-08-18 MED ORDER — FENTANYL CITRATE (PF) 100 MCG/2ML IJ SOLN
INTRAMUSCULAR | Status: DC | PRN
  Administered 2023-08-18 (×6): 50 ug via INTRAVENOUS

## 2023-08-18 MED ORDER — LIDOCAINE HCL (PF) 2 % IJ SOLN
INTRAMUSCULAR | Status: AC
  Filled 2023-08-18: qty 5

## 2023-08-18 MED ORDER — ENOXAPARIN SODIUM 60 MG/0.6ML IJ SOSY
50.0000 mg | PREFILLED_SYRINGE | INTRAMUSCULAR | Status: DC
  Administered 2023-08-19 – 2023-08-23 (×5): 50 mg via SUBCUTANEOUS
  Filled 2023-08-18 (×5): qty 0.6

## 2023-08-18 MED ORDER — PHENYLEPHRINE 80 MCG/ML (10ML) SYRINGE FOR IV PUSH (FOR BLOOD PRESSURE SUPPORT): PREFILLED_SYRINGE | INTRAVENOUS | Status: DC | PRN

## 2023-08-18 MED ORDER — PROPOFOL 10 MG/ML IV BOLUS
INTRAVENOUS | Status: DC | PRN
Start: 2023-08-18 — End: 2023-08-18
  Administered 2023-08-18: 40 mg via INTRAVENOUS
  Administered 2023-08-18: 150 mg via INTRAVENOUS

## 2023-08-18 MED ORDER — HEPARIN SODIUM (PORCINE) 1000 UNIT/ML IJ SOLN
INTRAMUSCULAR | Status: DC | PRN
Start: 2023-08-18 — End: 2023-08-18
  Administered 2023-08-18: 2000 [IU] via INTRAVENOUS
  Administered 2023-08-18: 6000 [IU] via INTRAVENOUS
  Administered 2023-08-18: 3000 [IU] via INTRAVENOUS

## 2023-08-18 MED ORDER — CEFAZOLIN SODIUM-DEXTROSE 2-4 GM/100ML-% IV SOLN
2.0000 g | Freq: Three times a day (TID) | INTRAVENOUS | Status: AC
  Administered 2023-08-19 (×2): 2 g via INTRAVENOUS
  Filled 2023-08-18 (×3): qty 100

## 2023-08-18 MED ORDER — HEPARIN 30,000 UNITS/1000 ML (OHS) CELLSAVER SOLUTION
Status: AC
  Filled 2023-08-18: qty 1000

## 2023-08-18 MED ORDER — PREGABALIN 50 MG PO CAPS
50.0000 mg | ORAL_CAPSULE | Freq: Every day | ORAL | Status: DC
  Administered 2023-08-19 – 2023-08-22 (×5): 50 mg via ORAL
  Filled 2023-08-18 (×2): qty 1
  Filled 2023-08-18: qty 2
  Filled 2023-08-18: qty 1
  Filled 2023-08-18: qty 2
  Filled 2023-08-18: qty 1

## 2023-08-18 MED ORDER — CEFAZOLIN SODIUM-DEXTROSE 2-4 GM/100ML-% IV SOLN
INTRAVENOUS | Status: AC
  Filled 2023-08-18: qty 100

## 2023-08-18 MED ORDER — CLOPIDOGREL BISULFATE 75 MG PO TABS
75.0000 mg | ORAL_TABLET | Freq: Every day | ORAL | Status: DC
  Administered 2023-08-19 – 2023-08-23 (×5): 75 mg via ORAL
  Filled 2023-08-18 (×5): qty 1

## 2023-08-18 MED ORDER — FAMOTIDINE IN NACL 20-0.9 MG/50ML-% IV SOLN
20.0000 mg | Freq: Two times a day (BID) | INTRAVENOUS | Status: DC
  Administered 2023-08-18: 20 mg via INTRAVENOUS
  Filled 2023-08-18: qty 50

## 2023-08-18 MED ORDER — OXYCODONE HCL 5 MG PO TABS: 5.0000 mg | ORAL_TABLET | Freq: Once | ORAL | Status: DC | PRN

## 2023-08-18 MED ORDER — HEPARIN SODIUM (PORCINE) 5000 UNIT/ML IJ SOLN
INTRAMUSCULAR | Status: AC
  Filled 2023-08-18: qty 1

## 2023-08-18 MED ORDER — OXYCODONE-ACETAMINOPHEN 5-325 MG PO TABS
1.0000 | ORAL_TABLET | ORAL | Status: DC | PRN
  Administered 2023-08-19: 2 via ORAL
  Administered 2023-08-19: 1 via ORAL
  Administered 2023-08-19 – 2023-08-20 (×5): 2 via ORAL
  Administered 2023-08-20 – 2023-08-21 (×3): 1 via ORAL
  Administered 2023-08-21: 2 via ORAL
  Filled 2023-08-18 (×2): qty 1
  Filled 2023-08-18 (×3): qty 2
  Filled 2023-08-18 (×2): qty 1
  Filled 2023-08-18 (×4): qty 2

## 2023-08-18 MED ORDER — ASPIRIN 81 MG PO TBEC
81.0000 mg | DELAYED_RELEASE_TABLET | Freq: Every day | ORAL | Status: DC
  Administered 2023-08-18 – 2023-08-22 (×5): 81 mg via ORAL
  Filled 2023-08-18 (×6): qty 1

## 2023-08-18 MED ORDER — MAGNESIUM SULFATE 2 GM/50ML IV SOLN: 2.0000 g | Freq: Every day | INTRAVENOUS | Status: DC | PRN

## 2023-08-18 MED ORDER — DOPAMINE-DEXTROSE 3.2-5 MG/ML-% IV SOLN: 3.0000 ug/kg/min | INTRAVENOUS | Status: DC

## 2023-08-18 MED ORDER — HEPARIN SODIUM (PORCINE) 1000 UNIT/ML IJ SOLN
INTRAMUSCULAR | Status: AC
  Filled 2023-08-18: qty 10

## 2023-08-18 MED ORDER — SODIUM CHLORIDE 0.9 % IR SOLN
Status: DC | PRN
  Administered 2023-08-18: 501 mL

## 2023-08-18 MED ORDER — LIDOCAINE HCL (CARDIAC) PF 100 MG/5ML IV SOSY
PREFILLED_SYRINGE | INTRAVENOUS | Status: DC | PRN
  Administered 2023-08-18: 100 mg via INTRAVENOUS

## 2023-08-18 SURGICAL SUPPLY — 79 items
AGENT HMST PWDR BTL CLGN 5GM (Miscellaneous) ×1 IMPLANT
APL PRP STRL LF DISP 70% ISPRP (MISCELLANEOUS) ×2
BAG DECANTER FOR FLEXI CONT (MISCELLANEOUS) ×1 IMPLANT
BAG ISL LRG 20X20 DRWSTRG (DRAPES) ×1
BAG ISOLATATION DRAPE 20X20 ST (DRAPES) ×1 IMPLANT
BLADE SURG 15 STRL LF DISP TIS (BLADE) ×1 IMPLANT
BLADE SURG 15 STRL SS (BLADE) ×1
BLADE SURG SZ11 CARB STEEL (BLADE) ×1 IMPLANT
BOOT SUTURE VASCULAR YLW (MISCELLANEOUS) ×1
BRUSH SCRUB EZ 4% CHG (MISCELLANEOUS) ×1 IMPLANT
CHLORAPREP W/TINT 26 (MISCELLANEOUS) ×2 IMPLANT
CLAMP SUTURE YELLOW 5 PAIRS (MISCELLANEOUS) ×2 IMPLANT
CLIP SPRNG 6 S-JAW DBL (CLIP) IMPLANT
CLIP SPRNG 6MM S-JAW DBL (CLIP) ×1
COLLAGEN CELLERATERX 5 GRAM (Miscellaneous) IMPLANT
COVER PROBE FLX POLY STRL (MISCELLANEOUS) IMPLANT
DRAPE INCISE IOBAN 66X45 STRL (DRAPES) ×2 IMPLANT
DRAPE SHEET LG 3/4 BI-LAMINATE (DRAPES) ×1 IMPLANT
DRESSING PEEL AND PLAC PRVNA20 (GAUZE/BANDAGES/DRESSINGS) IMPLANT
DRESSING SURGICEL FIBRLLR 1X2 (HEMOSTASIS) ×2 IMPLANT
DRSG OPSITE POSTOP 4X12 (GAUZE/BANDAGES/DRESSINGS) IMPLANT
DRSG OPSITE POSTOP 4X14 (GAUZE/BANDAGES/DRESSINGS) IMPLANT
DRSG OPSITE POSTOP 4X8 (GAUZE/BANDAGES/DRESSINGS) IMPLANT
DRSG PEEL AND PLACE PREVENA 20 (GAUZE/BANDAGES/DRESSINGS) ×1
DRSG SURGICEL FIBRILLAR 1X2 (HEMOSTASIS) ×3
DRSG XEROFORM 1X8 (GAUZE/BANDAGES/DRESSINGS) IMPLANT
ELECT CAUTERY BLADE 6.4 (BLADE) ×2 IMPLANT
ELECT REM PT RETURN 9FT ADLT (ELECTROSURGICAL) ×1
ELECTRODE REM PT RTRN 9FT ADLT (ELECTROSURGICAL) ×1 IMPLANT
GAUZE 4X4 16PLY ~~LOC~~+RFID DBL (SPONGE) ×2 IMPLANT
GLOVE BIO SURGEON STRL SZ7 (GLOVE) ×2 IMPLANT
GLOVE SURG SYN 8.0 (GLOVE) ×5 IMPLANT
GLOVE SURG SYN 8.0 PF PI (GLOVE) ×1 IMPLANT
GOWN STRL REUS W/ TWL LRG LVL3 (GOWN DISPOSABLE) ×4 IMPLANT
GOWN STRL REUS W/ TWL XL LVL3 (GOWN DISPOSABLE) ×2 IMPLANT
GOWN STRL REUS W/TWL LRG LVL3 (GOWN DISPOSABLE) ×4
GOWN STRL REUS W/TWL XL LVL3 (GOWN DISPOSABLE) ×2
HEAD CUTTING 'VALVULOTOME URSL (MISCELLANEOUS) ×1 IMPLANT
IV NS 500ML (IV SOLUTION) ×1
IV NS 500ML BAXH (IV SOLUTION) ×1 IMPLANT
KIT PREVENA INCISION MGT 13 (CANNISTER) IMPLANT
KIT TURNOVER KIT A (KITS) ×1 IMPLANT
LABEL OR SOLS (LABEL) ×1 IMPLANT
LOOP VESSEL MAXI 1X406 RED (MISCELLANEOUS) ×3 IMPLANT
LOOP VESSEL MINI 0.8X406 BLUE (MISCELLANEOUS) ×2 IMPLANT
MANIFOLD NEPTUNE II (INSTRUMENTS) ×1 IMPLANT
NDL FILTER BLUNT 18X1 1/2 (NEEDLE) ×1 IMPLANT
NEEDLE FILTER BLUNT 18X1 1/2 (NEEDLE) ×1 IMPLANT
PACK BASIN MAJOR ARMC (MISCELLANEOUS) ×1 IMPLANT
PACK UNIVERSAL (MISCELLANEOUS) ×1 IMPLANT
PAD PREP OB/GYN DISP 24X41 (PERSONAL CARE ITEMS) ×1 IMPLANT
PATCH VASC XENOSURE .8CM X 8CM (Vascular Products) IMPLANT
SET WALTER ACTIVATION W/DRAPE (SET/KITS/TRAYS/PACK) ×1 IMPLANT
SOLUTION CELL SAVER (CLIP) ×1 IMPLANT
SPONGE T-LAP 18X18 ~~LOC~~+RFID (SPONGE) ×3 IMPLANT
STAPLER SKIN PROX 35W (STAPLE) ×1 IMPLANT
SUT PROLENE 5 0 RB 1 DA (SUTURE) ×2 IMPLANT
SUT PROLENE 6 0 BV (SUTURE) ×6 IMPLANT
SUT PROLENE 7 0 BV 1 (SUTURE) ×4 IMPLANT
SUT SILK 2 0 (SUTURE) ×3
SUT SILK 2 0 SH (SUTURE) ×1 IMPLANT
SUT SILK 2-0 18XBRD TIE 12 (SUTURE) ×1 IMPLANT
SUT SILK 3 0 (SUTURE) ×1
SUT SILK 3 0 12 30 (SUTURE) IMPLANT
SUT SILK 3-0 18XBRD TIE 12 (SUTURE) ×1 IMPLANT
SUT VIC AB 2-0 CT1 (SUTURE) ×3 IMPLANT
SUT VIC AB 3-0 SH 27 (SUTURE) ×3
SUT VIC AB 3-0 SH 27X BRD (SUTURE) ×1 IMPLANT
SUT VICRYL+ 3-0 36IN CT-1 (SUTURE) ×3 IMPLANT
SYR 20ML LL LF (SYRINGE) ×1 IMPLANT
SYR 3ML LL SCALE MARK (SYRINGE) ×1 IMPLANT
TAG SUTURE CLAMP YLW 5PR (MISCELLANEOUS) ×1
TOWEL OR 17X26 4PK STRL BLUE (TOWEL DISPOSABLE) ×1 IMPLANT
TRAP FLUID SMOKE EVACUATOR (MISCELLANEOUS) ×1 IMPLANT
TRAY FOLEY MTR SLVR 16FR STAT (SET/KITS/TRAYS/PACK) ×1 IMPLANT
VALVULOTOME HEAD CUTTING URSL (MISCELLANEOUS) IMPLANT
VALVULOTOME URESIL (MISCELLANEOUS) ×1
VASC PATCH XENOSURE .8CM X 8CM (Vascular Products) ×1 IMPLANT
WATER STERILE IRR 500ML POUR (IV SOLUTION) ×1 IMPLANT

## 2023-08-18 NOTE — Anesthesia Postprocedure Evaluation (Signed)
Anesthesia Post Note  Patient: Juan Watson  Procedure(s) Performed: BYPASS GRAFT FEMORAL-TIBIAL ARTERY (WITH VEIN) (Right) APPLICATION OF CELL SAVER (Right) ENDARTERECTOMY POPLITEAL (Right) ENDARTERECTOMY FEMORAL (Right) ENDARTERECTOMY TIBIOPERONEAL (Right)  Patient location during evaluation: PACU Anesthesia Type: General Level of consciousness: awake and alert, oriented and patient cooperative Pain management: pain level controlled Vital Signs Assessment: post-procedure vital signs reviewed and stable Respiratory status: spontaneous breathing, nonlabored ventilation and respiratory function stable Cardiovascular status: blood pressure returned to baseline and stable Postop Assessment: adequate PO intake Anesthetic complications: no   No notable events documented.   Last Vitals:  Vitals:   08/18/23 1900 08/18/23 1915  BP: (!) 108/56 (!) 155/75  Pulse: 75 81  Resp: 12 16  Temp: 36.7 C   SpO2: 91% 92%    Last Pain:  Vitals:   08/18/23 1900  TempSrc:   PainSc: 0-No pain                 Reed Breech

## 2023-08-18 NOTE — Op Note (Signed)
[facility]   OPERATIVE NOTE     PRE-OPERATIVE DIAGNOSIS:   Atherosclerotic occlusive disease bilateral lower extremities with rest pain and ulceration of the right foot. Tophaceous gout of the right great toe with ulceration  POST-OPERATIVE DIAGNOSIS: Same  PROCEDURE: 1.  Right common femoral artery to peroneal artery bypass with in-situ saphenous vein graft. 2.  Right common femoral endarterectomy with repair using the hood of the great saphenous vein in situ bypass 3.  Separate and distinct endarterectomy of the right profunda femoris artery with bovine pericardial patch. 4.  Right popliteal and anterior tibial artery endarterectomy with primary repair 5.  Right tibioperoneal trunk and peroneal endarterectomy. 6.  Exploration of the right anterior tibial artery. 7.  On table angiography right peroneal and right anterior tibial status post endarterectomy.   CO-SURGEONS: [name], MD and  Festus Barren, MD  ASSISTANT(S): none  ANESTHESIA: general  ESTIMATED BLOOD LOSS: 750 cc  FINDING(S): Extensive very advanced femoral-popliteal and tibial vessel atherosclerosis  SPECIMEN(S): Plaque from the common femoral artery, profunda femoris artery, popliteal and anterior tibial artery and peroneal arteries  INDICATIONS:   Juan Watson is a 72 y.o. male who presents with worsening wrist pain as well as ulceration of the right great toe.  He has undergone angiography and was found to be a very poor interventional candidate.  Given this fact the patient requires surgical bypass for revascularization.  Risks and benefits were discussed including but not limited to bleeding, infection, thrombosis, limb loss, injury to nearby structures, cardiopulmonary complications, and death.  DESCRIPTION: After full informed written consent was obtained, the patient was brought back to the operating room and placed supine upon the operating table.  Prior to induction, the  patient was given intravenous antibiotics.    Co-surgeons are required because this is a complex multilevel procedure with work being performed simultaneously from both the patient's right and left sides.  This also expedites the procedure making a shorter operative time reducing complications and improving patient safety.   After obtaining adequate anesthesia, the patient was prepped and draped in the standard fashion for a femoral to popliteal bypass operation.  Appropriate timeout was called.  Attention was turned to the right groin.  A longitudinal incision was made over the right common femoral artery.  Using blunt dissection and electrocautery, the artery was dissected out from the inguinal ligament down to the femoral bifurcation.  The superficial femoral artery, profunda femoral artery, and external iliac artery were dissected out and vessel loops applied.  Circumflex branches were also dissected and controlled with vessel loops.  This common femoral artery was not pulsatile and found on exam to be densely calcified.    Based on the patient's preoperative angiogram the dissection was also carried down the profunda femoris to the fourth order branches looping all branches as they were encountered with Silastic Vesseloops.  Lateral calf incision was made and carried down through the muscle bellies to identify the anterior tibial artery and veins.  After meticulous dissection to allow better examination the anterior tibial artery did not appear to be a suitable candidate for bypass.  This is based largely on the profound calcifications encountered.  At this point, attention was turned to the calf.  Ultrasound was utilized to localize the great saphenous vein.  This aided in positioning the longitudinal medial calf incision was made overlying the vein itself.  The saphenous vein was encountered and then skeletonized ligating branches using 2-0 and 3-0 silk  ties as needed.  It was then reflected  posteriorly.  Using blunt dissection and electrocautery, a plane was developed through the subcutaneous tissue and fascia down to the popliteal space.  The popliteal vein was dissected out and retracted medially and posteriorly.  The below-the-knee popliteal artery was dissected away from the popliteal vein.        Working from the popliteal artery distally the venous branches overlying the artery were ligated and divided.  This allowed for exposure of the anterior tibial artery for a distance of approximately 15 mm.  At this point given the findings regarding the anterior tibial we elected to access the popliteal artery to perform on table angiography to see if there was a more optimal target.  No blood was returned on accessing the needle and so we elected to open the artery with an 11 blade.   Prior to doing this the patient was heparinized with 6000 units of heparin.  An additional 3000 units of heparin was given during the case as it progressed.   Endarterectomy was then performed extending into the anterior tibial artery.  This then allowed for placement of the marks olive-tipped into the anterior tibial artery and on table angiography was performed.  This confirmed that the anterior tibial artery itself had multiple high-grade hemodynamically significant lesions and given its dense calcification was less than ideal for a sole target for bypass.  The peroneal artery did show up in the angiogram and therefore we elected to extend the incision down the entire length of the tibioperoneal trunk and onto the peroneal artery.  Silastic Vesseloops were then placed around the mid peroneal artery as well as the posterior tibial and the peroneal.  At this point a completely separate arteriotomy was made in the tibioperoneal trunk extending it onto the peroneal.  Endarterectomy was performed under direct visualization.  6-0 Prolene sutures were used to tack the distal intimal edge.  Once again the marks  olive-tipped was placed into the peroneal artery and on table angiography was performed this demonstrated the peroneal artery was indeed patent however similar to the posterior tibial and its distal portion did have diffuse disease.  Given that after performing peroneal endarterectomy as well as anterior tibial endarterectomy we now are able to recruit both of these tibial arteries into the distal outflow along with numerous collaterals.  Given this even though the conditions appear to be less than ideal we felt that the bypass would have adequate outflow to support distal perfusion and patency.  We then returned to the groin.  Arteriotomy was made in the common femoral and endarterectomy was performed under direct visualization.  Based based on this we knew we would be able to cover the arteriotomy with a saphenous vein bypass graft.  Therefore, through a separate arteriotomy we opened the profunda femoris artery and performed endarterectomy under direct visualization with a freer elevator.  Distal intimal edge was tacked with interrupted 6-0 Prolene sutures.  Bovine pericardial patch was then delivered onto the field and applied to repair the profunda femoris arteriotomy using running 6-0 Prolene.  Once we completed this repair we returned our attention to the saphenous vein.  At this point, the patient's right greater saphenous vein was dissected out for harvest for conduit.  The saphenofemoral junction was then taken down after a Satinsky clamp was applied.  The common femoral venotomy was repaired with 5-0 Prolene in a horizontal mattress suture followed by an over under stitch.  The proximal valve was  then lysed under direct visualization and the vein spatulated to the proper length and applied to repair the common femoral artery using running 6-0 Prolene.  Flushing maneuvers were performed and flow was reestablished to the profunda and to the bypass vein as well as the proximal SFA.  At this point the  saphenous vein was ligated distally at the base of the calf incision.  It was marked for orientation.  A year seal valvulotome was then opened onto the field.  Valvulotome was passed of the vein initially a past episode branch but we were then able to explore the vein by the knee and manually direct the valvulotome of the saphenous vein.  A total of 3 passes were made with return of pulsatile flow.  At this point the popliteal arteriotomy that extended onto the origin of the anterior tibial was closed primarily using 6-0 Prolene.  The distal vein was then managed to the leg but it was in the straightened position trimmed to the appropriate length and spatulated.  A distal anastomosis was created within and saphenous vein to side tibioperoneal trunk peroneal bypass.  This was fashioned with running 6-0 Prolene.  Prior to completing this anastomosis, all vessels were backbled. No thrombus was noted from any vessels and backbleeding was good.  The bypass conduit was allowed to bleed in an antegrade fashion with excellent pulsatile flow.  The anastomosis was completed in the usual fashion.  At this point, all incisions were washed out and Surgicel and Vistaseal were placed into both incisions.    At this point, bleeding in both incisions were controlled with electrocautery and suture ligature.  The calf incision was closed with interrupted deep sutures to reapproximate the deep muscles and a running stitch of 3-0 Vicryl in the subcutaneous tissue.  The skin was reapproximated with staples. .  Attention was turned to the groin.  The groin was repaired with a double layer of 2-0 Vicryl immediately superficial to the bypass conduit.  The superficial subcutaneous tissue was reapproximated with two layers of 3-0 Vicryl.  The skin was reapproximated with staples and a disposable Prevena VAC was applied to the groin incision.  The lateral anterior tibial incision was closed with a layer of 3-0 Vicryl in the  subcutaneous tissue.  The skin was then reapproximated with staples.  The skin was cleaned, dried, and sterile bandages applied.   The patient was then awakened from anesthesia and taken to the recovery room in stable condition having tolerated the procedure well.     COMPLICATIONS: none  CONDITION: stable   [name] 08/18/2023 6:27 PM   This note was created with Dragon Medical transcription system. Any errors in dictation are purely unintentional.

## 2023-08-18 NOTE — Anesthesia Preprocedure Evaluation (Signed)
Anesthesia Evaluation  Patient identified by MRN, date of birth, ID band Patient awake    Reviewed: Allergy & Precautions, NPO status , Patient's Chart, lab work & pertinent test results  History of Anesthesia Complications Negative for: history of anesthetic complications  Airway Mallampati: III  TM Distance: >3 FB Neck ROM: Full    Dental no notable dental hx. (+) Chipped, Dental Advidsory Given   Pulmonary neg pulmonary ROS, former smoker   Pulmonary exam normal breath sounds clear to auscultation       Cardiovascular hypertension, Pt. on medications + CAD and + Peripheral Vascular Disease  negative cardio ROS Normal cardiovascular exam Rhythm:Regular Rate:Normal     Neuro/Psych Cataract negative neurological ROS  negative psych ROS   GI/Hepatic negative GI ROS, Neg liver ROS,GERD  Medicated and Controlled,,  Endo/Other  negative endocrine ROSdiabetes, Type 2, Oral Hypoglycemic Agents    Renal/GU      Musculoskeletal   Abdominal   Peds  Hematology negative hematology ROS (+)   Anesthesia Other Findings Past Medical History: 11/15/2015: Acquired flat foot 04/15/2015: Arthritis of knee, degenerative No date: BPH (benign prostatic hyperplasia) 08/10/2020: Chewing tobacco nicotine dependence without complication 06/28/2017: Cholecystitis No date: Coronary artery disease No date: Diabetes mellitus without complication (HCC) 12/13/2002: Diabetes mellitus, type 2 (HCC)     Comment:  Overview:  DIET CONTROLLED  07/16/2011: Disease of nail 10/29/2014: Encounter for screening for malignant neoplasm of prostate 10/17/2010: Essential (primary) hypertension 03/26/2014: Familial aortic aneurysm 03/26/2014: Family history of cardiovascular disease 08/11/2013: Gastro-esophageal reflux disease without esophagitis 08/11/2013: Generalized OA 03/26/2014: Gout No date: Heart murmur No date: HOH (hard of hearing)      Comment:  aids No date: Motion sickness     Comment:  "sea sick" No date: Peripheral vascular disease (HCC) 04/14/2015: Primary osteoarthritis of right knee 12/13/2002: Pure hypercholesterolemia 03/11/2015: Skin lesion  Past Surgical History: [age]: BACK SURGERY 70's: CARDIAC CATHETERIZATION     Comment:  pericarditis 03/23/2016: CATARACT EXTRACTION W/PHACO; Right     Comment:  Procedure: CATARACT EXTRACTION PHACO AND INTRAOCULAR               LENS PLACEMENT (IOC);  Surgeon: Sallee Lange, MD;                Location: [facility];  Service: Ophthalmology;  Laterality:              Right;  [place] 01:31 10/23/2019: CATARACT EXTRACTION W/PHACO; Left     Comment:  Procedure: CATARACT EXTRACTION PHACO AND INTRAOCULAR               LENS PLACEMENT (IOC) LEFT DIABETIC TORIC LENS 5.82,                 00:44.1;  Surgeon: Nevada Crane, MD;  Location:               [facility];  Service: Ophthalmology;                Laterality: Left;  Diabetic - oral meds 06/29/2017: CHOLECYSTECTOMY; N/A     Comment:  Procedure: LAPAROSCOPIC CHOLECYSTECTOMY;  Surgeon:               Henrene Dodge, MD;  Location: [facility];  Service:               General;  Laterality: N/A; 10/06/2017: COLONOSCOPY WITH PROPOFOL; N/A     Comment:  Procedure: COLONOSCOPY WITH PROPOFOL;  Surgeon: Mechele Collin,  Wilber Bihari, MD;  Location: [facility];  Service:               Endoscopy;  Laterality: N/A; 04/11/2020: CORONARY STENT INTERVENTION; N/A     Comment:  Procedure: CORONARY STENT INTERVENTION;  Surgeon:               Alwyn Pea, MD;  Location: [facility];               Service: Cardiovascular;  Laterality: N/A; 11,: HERNIA REPAIR     Comment:  umbilical x2 12/20/2017: KNEE ARTHROSCOPY; Right     Comment:  Procedure: ARTHROSCOPY KNEE EXTENSIVE SYNOVECTOMY AND               LYSIS OF ADHESIONS;  Surgeon: Donato Heinz, MD;                Location: [facility];  Service: Orthopedics;   Laterality:               Right; No date: KNEE DEBRIDEMENT; Left     Comment:  tendon 04/11/2020: LEFT HEART CATH AND CORONARY ANGIOGRAPHY; Left     Comment:  Procedure: LEFT HEART CATH AND CORONARY ANGIOGRAPHY;                Surgeon: Lamar Blinks, MD;  Location: [facility]               CV LAB;  Service: Cardiovascular;  Laterality: Left; 09/24/2020: LOWER EXTREMITY ANGIOGRAPHY; Left     Comment:  Procedure: LOWER EXTREMITY ANGIOGRAPHY;  Surgeon:               Renford Dills, MD;  Location: [facility];               Service: Cardiovascular;  Laterality: Left; 07/13/2023: LOWER EXTREMITY ANGIOGRAPHY; Right     Comment:  Procedure: Lower Extremity Angiography;  Surgeon:               Renford Dills, MD;  Location: [facility];               Service: Cardiovascular;  Laterality: Right; 72: TONSILLECTOMY 04/15/2015: TOTAL KNEE ARTHROPLASTY; Right     Comment:  Procedure: TOTAL KNEE ARTHROPLASTY;  Surgeon: Gean Birchwood, MD;  Location: [facility];  Service: Orthopedics;                Laterality: Right; ?: TRANSURETHRAL RESECTION OF PROSTATE     Reproductive/Obstetrics negative OB ROS                             Anesthesia Physical Anesthesia Plan  ASA: 3  Anesthesia Plan: General ETT and General   Post-op Pain Management:    Induction: Intravenous  PONV Risk Score and Plan: 2 and Ondansetron, Dexamethasone and Midazolam  Airway Management Planned: Oral ETT  Additional Equipment: Arterial line  Intra-op Plan:   Post-operative Plan: Extubation in OR  Informed Consent: I have reviewed the patients History and Physical, chart, labs and discussed the procedure including the risks, benefits and alternatives for the proposed anesthesia with the patient or authorized representative who has indicated his/her understanding and acceptance.     Dental Advisory Given  Plan Discussed with: Anesthesiologist, CRNA and  Surgeon  Anesthesia Plan Comments: (Patient consented for risks of anesthesia including  but not limited to:  - adverse reactions to medications - damage to eyes, teeth, lips or other oral mucosa - nerve damage due to positioning  - sore throat or hoarseness - Damage to heart, brain, nerves, lungs, other parts of body or loss of life  Patient voiced understanding.)       Anesthesia Quick Evaluation

## 2023-08-18 NOTE — Progress Notes (Signed)
eLink Physician-Brief Progress Note Patient Name: Juan Watson DOB: May 21, 1951 MRN: [id number]   Date of Service  08/18/2023  HPI/Events of Note  34M with PAD, CAD, HTN, HTN admitted for elective surgery for limb ischemia in setting of severe atherosclerotic changed of LE bilaterally with ulceration and tissue loss of right foot. S/p OR with R CFA to peroneal bypass, RLE endarterectomy.  On camera check patient on [place]. Requesting pain meds. No pressors support  Primary team Vascular  eICU Interventions  Elink available for critical care support as needed     Intervention Category Evaluation Type: New Patient Evaluation  Juan Watson 08/18/2023, 7:39 PM

## 2023-08-18 NOTE — Interval H&P Note (Signed)
History and Physical Interval Note:  08/18/2023 11:37 AM  Juan Watson  has presented today for surgery, with the diagnosis of ASO WITH ULCERATION.  The various methods of treatment have been discussed with the patient and family. After consideration of risks, benefits and other options for treatment, the patient has consented to  Procedure(s): BYPASS GRAFT FEMORAL-TIBIAL ARTERY (WITH VEIN) (Right) APPLICATION OF CELL SAVER (Right) as a surgical intervention.  The patient's history has been reviewed, patient examined, no change in status, stable for surgery.  I have reviewed the patient's chart and labs.  Questions were answered to the patient's satisfaction.     [name]

## 2023-08-18 NOTE — Transfer of Care (Signed)
Immediate Anesthesia Transfer of Care Note  Patient: Juan Watson  Procedure(s) Performed: BYPASS GRAFT FEMORAL-TIBIAL ARTERY (WITH VEIN) (Right) APPLICATION OF CELL SAVER (Right) ENDARTERECTOMY POPLITEAL (Right) ENDARTERECTOMY FEMORAL (Right) ENDARTERECTOMY TIBIOPERONEAL (Right)  Patient Location: PACU  Anesthesia Type:General  Level of Consciousness: drowsy  Airway & Oxygen Therapy: Patient Spontanous Breathing and Patient connected to face mask oxygen  Post-op Assessment: Report given to RN  Post vital signs: stable  Last Vitals:  Vitals Value Taken Time  BP    Temp    Pulse 75 08/18/23 1842  Resp    SpO2 97 % 08/18/23 1842  Vitals shown include unfiled device data.  Last Pain:  Vitals:   08/18/23 0935  TempSrc: Oral  PainSc: 0-No pain         Complications: No notable events documented.

## 2023-08-18 NOTE — Op Note (Signed)
[facility]   OPERATIVE NOTE     PRE-OPERATIVE DIAGNOSIS: Atherosclerosis with rest pain and ulceration right lower extremity  POST-OPERATIVE DIAGNOSIS: Same  PROCEDURE: Right femoral artery to peroneal artery bypass with in-situ saphenous vein graft Separate and distinct right profunda femoris artery endarterectomy and patch angioplasty with bovine pericardium Right lower extremity angiogram Exploration of right anterior tibial artery Endarterectomy of the right popliteal artery and anterior tibial arteries Incisional (disposable) VAC placement on the femoral incision   SURGEONS: Festus Barren, MD and [name] MD, co-surgeons  ASSISTANT(S): none  ANESTHESIA: general  ESTIMATED BLOOD LOSS: 750 cc  FINDING(S): Right anterior tibial artery was not acceptable for distal bypass target Right profunda femoris artery was heavily diseased and required a separate endarterectomy  SPECIMEN(S): Right common femoral artery plaque Right profunda femoris artery plaque Right popliteal and tibial artery plaque   INDICATIONS:   Juan Watson is a 72 y.o. 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  The patient required surgical bypass for revascularization.  Risks and benefits were discussed including but not limited to bleeding, infection, thrombosis, limb loss, injury to nearby structures, cardiopulmonary complications, and death.  Co-surgeons were used due to the multiple surgical sites and the complexity of the procedure.  DESCRIPTION: After full informed written consent was obtained, the patient was brought back to the operating room and placed supine upon the operating table.  Prior to induction, the patient was given intravenous antibiotics.  After obtaining adequate anesthesia, the patient was prepped and draped in the standard fashion for a femoral to popliteal  bypass operation.  Attention was turned to the right groin.  A longitudinal incision was made over the right common femoral artery.  Using blunt dissection and electrocautery, the artery was dissected out from the inguinal ligament down to the femoral bifurcation.  The superficial femoral artery, profunda femoral artery, and external iliac artery were dissected out and vessel loops applied.  Circumflex branches were also dissected and controlled with vessel loops.  This common femoral artery was diseased and found on exam to be calcified.  In addition, the profunda femoris artery was heavily diseased about 2 branches this would not be able to be treated concomitant to the bypass arteriotomy.  It was decided a separate and distinct profunda femoris artery endarterectomy would be required to address this.  The anterior tibial artery was then exposed.  A longitudinal incision was created on the lateral aspect of the right calf.  This was dissected out tediously through the muscle belly.  Given his depth, this was not an easy dissection.  The artery was dissected out and found to be very small and calcified.  This was not an optimal target for bypass.  There was some flow through this vessel to the foot but in line bypass to this would be unlikely to be durable.  We elected to dissected out and expose the popliteal artery, tibioperoneal trunk, and peroneal artery to evaluate this as well as to perform an angiogram.  At this point, attention was turned to the calf.  An longitudinal incision was made one finger-width posterior to the tibia.  Using blunt dissection and electrocautery, a plane was developed through the subcutaneous tissue and fascia down to the popliteal space.  The popliteal vein was dissected out and retracted medially and posteriorly.  The below-the-knee popliteal artery was dissected away from the popliteal vein.  The dissection was then taken down to the tibioperoneal trunk and peroneal artery  distally.  This was also a tedious and difficult dissection.  The right popliteal artery was then exposed and opened after controlled proximally and distally with Vesseloops and heparin given.  There was a large chunk of calcium, but the marks tip was used to access both the anterior tibial artery and tibioperoneal trunk to perform on table angiogram distally.  The anterior tibial artery had multiple areas of high-grade stenosis and was not felt to be a good conduit for in line bypass.  We did feel it would be helpful to perform an endarterectomy in the proximal portion of the anterior tibial artery where there is high-grade stenosis as well as the popliteal artery to allow retrograde flow after a distal bypass to the tibioperoneal trunk or peroneal artery.  The peroneal artery on angiogram appeared to be the best option for distal bypass target and so this is what we selected to use.  The endarterectomy was created with the [facility] elevator and the popliteal artery and carried down to the proximal 1 to 2 cm of the anterior tibial artery.  A large chunk of plaque was able to be removed and a nice feathered distal endpoint was created.  The arteriotomy was then closed primarily with a running 6-0 Prolene suture.  At this point, the patient's right greater saphenous vein was dissected out for harvest for conduit.   The vein conduit was found to be adequate for bypass conduit.  Side branches of greater saphenous vein were tied off with 3-0 silk ties. We dissected down beyond the site of planned distal anastomosis to allow it to swing over and perform an anastomosis without tension.  At the end of this process, we felt the conduit to be adequate in quality and size for use.   At this point, the patient was given additional heparin intravenously.  After waiting three minutes, the external iliac artery, superficial femoral artery and profunda femoral artery were clamped.  An incision was made in the distal common  femoral artery and extended proximally and distally with a Potts scissor.  The artery was heavily diseased and an endarterectomy was required in the common femoral artery.  A good plane was created with a Therapist, nutritional.  We were able to get a large chunk of calcium in the more proximal common femoral artery with gentle traction with hemostats.  Similarly, the proximal SFA disease was removed with an eversion endarterectomy with hemostats.  The profunda femoris origin was identified, and a nice endpoint was created there, but as mentioned before a separate distinct profunda femoris artery endarterectomy was going to be required due to the heavy disease well beyond where we could manage from the common femoral arteriotomy. With control of the primary 3 profunda femoris branches, we made a separate distinct arteriotomy on the anterior wall of the profunda femoris artery with an 11 blade and extended this with Potts scissors.  The heavily diseased profunda femoris artery was then treated with an endarterectomy with a Therapist, nutritional.  The distal endpoint was tacked down with 6-0 Prolene sutures.  The proximal endpoint was carried up to the origin that could be seen through the common femoral artery arteriotomy and was clean and feathered.  A bovine pericardial patch was then brought on the field.  A 6-0 Prolene was started at the distal endpoint of the profunda femoris artery arteriotomy and run to the midportion of the wound.  The  patch was then cut and beveled to an appropriate length to match the arteriotomy and a second 6-0 Prolene was started at the proximal endpoint of the profunda femoris artery arteriotomy.  The sutures were run medially and laterally one half the length of the arteriotomy.  This completed the profunda femoris artery endarterectomy and patch angioplasty we then turned our attention to the vein bypass.  The vein was taken off of the saphenofemoral junction after clamping the junction.  The  junction was closed with two layers of 5-0 Prolene suture.  The proximal conduit was cut and bevelled to match the arteriotomy.  The conduit was sewn to the common femoral artery with a running stitch of 6-0 Prolene.  Prior to completing this anastomosis, all vessels were backbled.  No thrombus was noted from any vessels and backbleeding was good.  The anastomosis was completed in the usual fashion.  On release of control, the profunda femoris artery required several 6-0 Prolene patch sutures with pledgets for hemostasis and hemostasis was achieved.  Two 6-0 Prolene sutures were also used on the proximal vein bypass anastomosis for hemostasis.  Attention was then turned to the popliteal and tibial exposure.   We reset the exposure of the popliteal space.  We verified the peroneal artery was appropriately marked.  We determine the target segment for the anastomosis in the proximal peroneal artery.  We  applied tension to control the artery proximally and distally with vessel loops.  We made an incision with a 11-blade in the artery and extended it proximally and distally with a Potts scissor in the proximal peroneal artery. We ligated and divided the vein distally in the calf.  The distal vein was ligated with a 2-0 silk tie.  We then passed the valvulotome.  The small valvulotome was passed initially, and the valvulotome actually went out what appeared to be a duplicate saphenous system that we had ligated proximally and was smaller.  We extended our calf incision up to above the knee and were able to locate a branch point of the saphenous vein.  We then redirected the valvulotome up the other vein in this area and this then went up to her proximal bypass anastomosis and what was the true saphenofemoral junction.  We ligated the noncontinuous vein branch and another small vein branch at this level.  The small and the large valvulotome were then passed and excellent pulsatile inflow was then seen through the bypass  graft.  We pulled the conduit to appropriate tension and length, taking into account straightening out the leg.  I adjusted the length of the conduit sharply.  We spatulated this conduit to meet the dimensions of the arteriotomy.  The conduit was sewn to the peroneal artery with a running stitch of 6-0 Prolene.  Prior to completing this anastomosis, all vessels were backbled. No thrombus was noted from any vessels and backbleeding was good.  The bypass conduit was allowed to bleed in an antegrade fashion with excellent pulsatile flow.  The anastomosis was completed in the usual fashion.  At this point, all incisions were washed out and Fibrillar and Vistacel were placed into both incisions.    At this point, bleeding in all incisions were controlled with electrocautery and suture ligature.  The calf incision was closed with interrupted deep sutures to reapproximate the deep muscles and a running stitch of 3-0 Vicryl in the subcutaneous tissue.  The skin was reapproximated with staples. Cellerate was placed in the medial calf incision to  facilitate wound healing.  Attention was turned to the groin.  The groin was repaired with a double layer of 2-0 Vicryl immediately superficial to the bypass conduit.  The superficial subcutaneous tissue was reapproximated with two layers of 3-0 Vicryl.  Cellerate was again used to facilitate wound healing.  The skin was coapted with staples.  An incisional (disposable) VAC was then placed over the femoral incision.  The sponge was placed over the wound and strips of occlusive dressing created a good seal when connected to suction tubing.  The lateral calf incision was closed with interrupted 2-0 Vicryl, a running layer of 3-0 Vicryl, and staples for the skin.  The vein harvest incisions were closed with a layer of 3-0 Vicryl in the subcutaneous tissue.  The skin was then reapproximated with staples.  The skin was cleaned, dried, and sterile bandages applied.   The  patient was then awakened from anesthesia and taken to the recovery room in stable condition having tolerated the procedure well.     COMPLICATIONS: none  CONDITION: stable   Festus Barren 08/18/2023 6:26 PM   This note was created with Dragon Medical transcription system. Any errors in dictation are purely unintentional.

## 2023-08-18 NOTE — Anesthesia Procedure Notes (Signed)
Procedure Name: Intubation Date/Time: 08/18/2023 12:01 PM  Performed by: Elisabeth Pigeon, CRNAPre-anesthesia Checklist: Patient identified, Patient being monitored, Timeout performed, Emergency Drugs available and Suction available Patient Re-evaluated:Patient Re-evaluated prior to induction Oxygen Delivery Method: Circle system utilized Preoxygenation: Pre-oxygenation with 100% oxygen Induction Type: IV induction Ventilation: Mask ventilation without difficulty and Oral airway inserted - appropriate to patient size Laryngoscope Size: Hyacinth Meeker and 2 Grade View: Grade I Tube type: Oral Tube size: 7.5 mm Number of attempts: 1 Airway Equipment and Method: Stylet Placement Confirmation: ETT inserted through vocal cords under direct vision, positive ETCO2 and breath sounds checked- equal and bilateral Secured at: 23 cm Tube secured with: Tape Dental Injury: Teeth and Oropharynx as per pre-operative assessment

## 2023-08-18 NOTE — Anesthesia Procedure Notes (Signed)
Arterial Line Insertion Start/End9/18/2024 12:00 PM, 08/18/2023 12:05 PM Performed by: Stephanie Coup, MD, anesthesiologist  Patient location: OR. Preanesthetic checklist: patient identified, IV checked, site marked, risks and benefits discussed, surgical consent, monitors and equipment checked, pre-op evaluation and timeout performed Patient sedated Left, radial was placed Catheter size: 20 G Hand hygiene performed  and maximum sterile barriers used   Attempts: 1 Procedure performed without using ultrasound guided technique. Following insertion, dressing applied and Biopatch. Post procedure assessment: normal  Patient tolerated the procedure well with no immediate complications.

## 2023-08-19 ENCOUNTER — Encounter: Payer: Self-pay | Admitting: Vascular Surgery

## 2023-08-19 LAB — GLUCOSE, CAPILLARY
Glucose-Capillary: 168 mg/dL — ABNORMAL HIGH (ref 70–99)
Glucose-Capillary: 124 mg/dL — ABNORMAL HIGH (ref 70–99)
Glucose-Capillary: 158 mg/dL — ABNORMAL HIGH (ref 70–99)
Glucose-Capillary: 145 mg/dL — ABNORMAL HIGH (ref 70–99)
Glucose-Capillary: 124 mg/dL — ABNORMAL HIGH (ref 70–99)

## 2023-08-19 LAB — BASIC METABOLIC PANEL
Anion gap: 7 (ref 5–15)
BUN: 21 mg/dL (ref 8–23)
CO2: 22 mmol/L (ref 22–32)
Calcium: 7.6 mg/dL — ABNORMAL LOW (ref 8.9–10.3)
Chloride: 106 mmol/L (ref 98–111)
Creatinine, Ser: 1.1 mg/dL (ref 0.61–1.24)
GFR, Estimated: >60 mL/min (ref >60)
Glucose, Bld: 157 mg/dL — ABNORMAL HIGH (ref 70–99)
Potassium: 4.3 mmol/L (ref 3.5–5.1)
Sodium: 135 mmol/L (ref 135–145)

## 2023-08-19 LAB — HEMOGLOBIN A1C
Hgb A1c MFr Bld: 6.4 % — ABNORMAL HIGH (ref 4.8–5.6)
Mean Plasma Glucose: 137 mg/dL

## 2023-08-19 LAB — CBC
HCT: 29.1 % — ABNORMAL LOW (ref 39.0–52.0)
Hemoglobin: 10 g/dL — ABNORMAL LOW (ref 13.0–17.0)
MCH: 29 pg (ref 26.0–34.0)
MCHC: 34.4 g/dL (ref 30.0–36.0)
MCV: 84.3 fL (ref 80.0–100.0)
Platelets: 186 10*3/uL (ref 150–400)
RBC: 3.45 MIL/uL — ABNORMAL LOW (ref 4.22–5.81)
RDW: 13.1 % (ref 11.5–15.5)
WBC: 19.8 10*3/uL — ABNORMAL HIGH (ref 4.0–10.5)
nRBC: 0 % (ref 0.0–0.2)

## 2023-08-19 MED ORDER — INSULIN ASPART 100 UNIT/ML IJ SOLN: 0.0000 [IU] | Freq: Every day | INTRAMUSCULAR | Status: DC

## 2023-08-19 MED ORDER — INSULIN ASPART 100 UNIT/ML IJ SOLN: 0.0000 [IU] | Freq: Three times a day (TID) | INTRAMUSCULAR | Status: DC

## 2023-08-19 MED ORDER — FAMOTIDINE 20 MG PO TABS
20.0000 mg | ORAL_TABLET | Freq: Two times a day (BID) | ORAL | Status: DC
  Administered 2023-08-19 – 2023-08-23 (×9): 20 mg via ORAL
  Filled 2023-08-19 (×9): qty 1

## 2023-08-19 MED ORDER — CHLORHEXIDINE GLUCONATE CLOTH 2 % EX PADS
6.0000 | MEDICATED_PAD | Freq: Every day | CUTANEOUS | Status: DC
  Administered 2023-08-19 – 2023-08-22 (×3): 6 via TOPICAL

## 2023-08-19 NOTE — Evaluation (Signed)
Physical Therapy Evaluation Patient Details Name: Juan Watson MRN: [id number] DOB: 21-Dec-1950 Today's Date: 08/19/2023  History of Present Illness  Patient is a 72 year old male with atherosclerosis with rest pain and ulceration right lower extremity. S/p Right femoral artery to peroneal artery bypass, right profunda femoris artery endarterectomy and patch angioplasty, Endarterectomy of the right popliteal artery and anterior tibial arteries   Clinical Impression  Patient is agreeable to PT evaluation. He was seen after pain medication but is still limited by pain with mobility, fatigue, and nausea with movement. He is typically modified independent with ambulation using a 4 wheeled walker at baseline. He lives with supportive spouse.  Today, the patient required assistance with all mobility. 2 person assistance required for standing using rolling walker. Pre-gait activity performed with emphasis on upright standing posture, decreasing base of support, and using rolling walker to off load weight on RLE for comfort. Patient was unable to progress ambulation as patient has increased pain with mobility and nausea. Anticipate when patient is better, patient will have increased activity tolerance. He is motivated to return home at discharge. PT will continue to follow to maximize independence and decrease caregiver burden. Will adjust discharge recommendations as needed based on functional progress.       If plan is discharge home, recommend the following: A lot of help with walking and/or transfers;A lot of help with bathing/dressing/bathroom;Assistance with cooking/housework;Help with stairs or ramp for entrance;Assist for transportation   Can travel by private vehicle        Equipment Recommendations None recommended by PT  Recommendations for Other Services       Functional Status Assessment Patient has had a recent decline in their functional status and demonstrates the ability to make  significant improvements in function in a reasonable and predictable amount of time.     Precautions / Restrictions Precautions Precautions: Fall Restrictions Weight Bearing Restrictions: No      Mobility  Bed Mobility Overal bed mobility: Needs Assistance Bed Mobility: Supine to Sit, Sit to Supine     Supine to sit: Min assist Sit to supine: Min assist, +2 for physical assistance   General bed mobility comments: assistance for trunk support to sit upright. increased assistance required for return to bed due to fatigue, pain, nausea    Transfers Overall transfer level: Needs assistance Equipment used: Rolling walker (2 wheels) Transfers: Sit to/from Stand Sit to Stand: Mod assist, +2 physical assistance, From elevated surface           General transfer comment: verbal cues for technique with patient preferring to pull up using rolling walker due to pain. patient is able to perform incremental scooting to the right x 4-5 bouts with cues for technique and minimal assistance    Ambulation/Gait             Pre-gait activities: standing tolerance is limited to less than one minute. emphasis on upright standing posture, decreasing wide base of support and using rolling walker to off load the right leg for comfort.    Stairs            Wheelchair Mobility     Tilt Bed    Modified Rankin (Stroke Patients Only)       Balance Overall balance assessment: Needs assistance Sitting-balance support: Feet supported Sitting balance-Leahy Scale: Fair     Standing balance support: During functional activity, Bilateral upper extremity supported, Reliant on assistive device for balance Standing balance-Leahy Scale: Poor Standing balance comment: heavy  reliance on rolling walke for support in standing                             Pertinent Vitals/Pain Pain Assessment Pain Assessment: Faces Faces Pain Scale: Hurts whole lot Pain Location: R leg Pain  Descriptors / Indicators: Discomfort, Grimacing, Guarding, Sore Pain Intervention(s): Limited activity within patient's tolerance, Monitored during session, Premedicated before session, Repositioned    Home Living Family/patient expects to be discharged to:: Private residence Living Arrangements: Spouse/significant other Available Help at Discharge: Family Type of Home: House Home Access: Stairs to enter Entrance Stairs-Rails: Right Entrance Stairs-Number of Steps: 2   Home Layout: Two level;Able to live on main level with bedroom/bathroom Home Equipment: Shower seat;Rolling Walker (2 wheels);Rollator (4 wheels);Cane - single point;Cane - quad      Prior Function Prior Level of Function : Independent/Modified Independent;Driving             Mobility Comments: rollator in and outside the home, holds onto grocery cart when at the store ADLs Comments: wife manages medications     Extremity/Trunk Assessment   Upper Extremity Assessment Upper Extremity Assessment: Overall WFL for tasks assessed    Lower Extremity Assessment Lower Extremity Assessment: RLE deficits/detail (LLE WFL for tasks assessed) RLE Deficits / Details: guarding with any movement of RLE. patient is able to activate hip/knee/ankle movement in gravity eliminated position. at baseline, patient has only 70 degrees of knee flexion (reports due to scar tissue from prior knee surgery) RLE: Unable to fully assess due to pain RLE Sensation: WNL       Communication   Communication Communication: No apparent difficulties  Cognition Arousal: Alert Behavior During Therapy: WFL for tasks assessed/performed Overall Cognitive Status: Within Functional Limits for tasks assessed                                          General Comments General comments (skin integrity, edema, etc.): patient became nauseous after standing. vitals monitored throughout session with blood pressure 137/77 while supine and  101/89 after standing. Sp02 remained at 94% or higher on room air throughout activity bouts    Exercises     Assessment/Plan    PT Assessment Patient needs continued PT services  PT Problem List Decreased strength;Decreased range of motion;Decreased activity tolerance;Decreased mobility;Decreased balance;Decreased safety awareness;Pain;Decreased knowledge of use of DME       PT Treatment Interventions DME instruction;Gait training;Stair training;Functional mobility training;Therapeutic activities;Therapeutic exercise;Balance training;Neuromuscular re-education;Patient/family education    PT Goals (Current goals can be found in the Care Plan section)  Acute Rehab PT Goals Patient Stated Goal: pain control, to return home PT Goal Formulation: With patient Time For Goal Achievement: 08/26/23 Potential to Achieve Goals: Good    Frequency Min 1X/week     Co-evaluation PT/OT/SLP Co-Evaluation/Treatment: Yes Reason for Co-Treatment: Complexity of the patient's impairments (multi-system involvement) PT goals addressed during session: Mobility/safety with mobility OT goals addressed during session: ADL's and self-care       AM-PAC PT "6 Clicks" Mobility  Outcome Measure Help needed turning from your back to your side while in a flat bed without using bedrails?: A Little Help needed moving from lying on your back to sitting on the side of a flat bed without using bedrails?: A Little Help needed moving to and from a bed to a chair (including a  wheelchair)?: A Lot Help needed standing up from a chair using your arms (e.g., wheelchair or bedside chair)?: A Lot Help needed to walk in hospital room?: Total Help needed climbing 3-5 steps with a railing? : Total 6 Click Score: 12    End of Session   Activity Tolerance: Patient limited by pain;Patient limited by lethargy (limited by nausea) Patient left: in bed;with call bell/phone within reach;with bed alarm set Nurse Communication:  Mobility status PT Visit Diagnosis: Difficulty in walking, not elsewhere classified (R26.2);Pain Pain - Right/Left: Right Pain - part of body: Leg    Time: 6045-4098 PT Time Calculation (min) (ACUTE ONLY): 29 min   Charges:   PT Evaluation $PT Eval Moderate Complexity: 1 Mod PT Treatments $Therapeutic Activity: 8-22 mins PT General Charges $$ ACUTE PT VISIT: 1 Visit        Donna Bernard, PT, MPT  Ina Homes 08/19/2023, 12:05 PM

## 2023-08-19 NOTE — Evaluation (Signed)
Occupational Therapy Evaluation Patient Details Name: Juan Watson MRN: [id number] DOB: [birth year] Today's Date: 08/19/2023   History of Present Illness Patient is a 72 year old male with atherosclerosis with rest pain and ulceration right lower extremity. S/p Right femoral artery to peroneal artery bypass, right profunda femoris artery endarterectomy and patch angioplasty, Endarterectomy of the right popliteal artery and anterior tibial arteries   Clinical Impression   Pt was seen for OT evaluation this date and cotx with PT to optimize safety with ADL mobility. Prior to hospital admission, pt was ambulating with a rollator and generally independent with ADL. Wife manages medications at home. Pt lives in a 2 story home with 2 STE and R rail. Pt presents to acute OT demonstrating impaired ADL performance and functional mobility 2/2 RLE pain, decreased RLE ROM and strength, and impaired balance (See OT problem list for additional functional deficits). Pt currently requires MIN-MOD A for LB ADL tasks and MOD A +2 for STS transfers with RW. Pt/spouse educated in AE for LB ADL tasks to improve comfort and independence. Pt verbalized understanding. Pt would benefit from skilled OT services to address noted impairments and functional limitations (see below for any additional details) in order to maximize safety and independence while minimizing falls risk and caregiver burden.    If plan is discharge home, recommend the following: Two people to help with walking and/or transfers;A little help with bathing/dressing/bathroom;Assistance with cooking/housework;Assist for transportation;Help with stairs or ramp for entrance    Functional Status Assessment  Patient has had a recent decline in their functional status and demonstrates the ability to make significant improvements in function in a reasonable and predictable amount of time.  Equipment Recommendations  Other (comment) (reacher, LH sponge)     Recommendations for Other Services       Precautions / Restrictions Precautions Precautions: Fall Restrictions Weight Bearing Restrictions: No      Mobility Bed Mobility Overal bed mobility: Needs Assistance Bed Mobility: Supine to Sit, Sit to Supine     Supine to sit: Min assist Sit to supine: Min assist, +2 for physical assistance   General bed mobility comments: assistance for trunk support to sit upright. increased assistance required for return to bed due to fatigue, pain, nausea    Transfers Overall transfer level: Needs assistance Equipment used: Rolling walker (2 wheels) Transfers: Sit to/from Stand Sit to Stand: Mod assist, +2 physical assistance, From elevated surface           General transfer comment: verbal cues for technique with patient preferring to pull up using rolling walker due to pain. patient is able to perform incremental scooting to the right x 4-5 bouts with cues for technique and minimal assistance      Balance Overall balance assessment: Needs assistance Sitting-balance support: Feet supported Sitting balance-Leahy Scale: Fair     Standing balance support: During functional activity, Bilateral upper extremity supported, Reliant on assistive device for balance Standing balance-Leahy Scale: Poor Standing balance comment: heavy reliance on rolling walke for support in standing                           ADL either performed or assessed with clinical judgement   ADL Overall ADL's : Needs assistance/impaired                     Lower Body Dressing: Sit to/from stand;Moderate assistance;Minimal assistance   Toilet Transfer: Moderate assistance;Rolling walker (2 wheels);+2  for physical assistance           Functional mobility during ADLs: Moderate assistance;Rolling walker (2 wheels);+2 for physical assistance       Vision         Perception         Praxis         Pertinent Vitals/Pain Pain  Assessment Pain Assessment: Faces Faces Pain Scale: Hurts whole lot Pain Location: R leg Pain Descriptors / Indicators: Discomfort, Grimacing, Guarding, Sore Pain Intervention(s): Limited activity within patient's tolerance, Monitored during session, Premedicated before session, Repositioned     Extremity/Trunk Assessment Upper Extremity Assessment Upper Extremity Assessment: Overall WFL for tasks assessed   Lower Extremity Assessment Lower Extremity Assessment: RLE deficits/detail;Defer to PT evaluation RLE Deficits / Details: guarding with any movement of RLE. patient is able to activate hip/knee/ankle movement in gravity eliminated position. at baseline, patient has only 70 degrees of knee flexion (reports due to scar tissue from prior knee surgery) RLE: Unable to fully assess due to pain RLE Sensation: WNL       Communication Communication Communication: No apparent difficulties   Cognition Arousal: Alert Behavior During Therapy: WFL for tasks assessed/performed Overall Cognitive Status: Within Functional Limits for tasks assessed                                       General Comments  Pt became nauseated after standing. Vitals monitored throughout with BP 137/77 while supine and 101/89 after standing. SpO2 remained at 94$ or higher on RA throughout activity    Exercises Other Exercises Other Exercises: Pt/spouse educated in AE for LB ADL tasks to improve comfort and independence.   Shoulder Instructions      Home Living Family/patient expects to be discharged to:: Private residence Living Arrangements: Spouse/significant other Available Help at Discharge: Family Type of Home: House Home Access: Stairs to enter [employer] of Steps: 2 Entrance Stairs-Rails: Right Home Layout: Two level;Able to live on main level with bedroom/bathroom     Bathroom Shower/Tub: Producer, television/film/video: Standard     Home Equipment: Advice worker (2 wheels);Rollator (4 wheels);Cane - single point;Cane - quad          Prior Functioning/Environment Prior Level of Function : Independent/Modified Independent;Driving             Mobility Comments: rollator in and outside the home, holds onto grocery cart when at the store ADLs Comments: wife manages medications        OT Problem List: Decreased strength;Decreased range of motion;Pain;Impaired balance (sitting and/or standing);Decreased activity tolerance;Decreased knowledge of use of DME or AE;Obesity      OT Treatment/Interventions: Self-care/ADL training;Therapeutic exercise;Therapeutic activities;Energy conservation;DME and/or AE instruction;Patient/family education;Balance training    OT Goals(Current goals can be found in the care plan section) Acute Rehab OT Goals Patient Stated Goal: have less pain and go home OT Goal Formulation: With patient/family Time For Goal Achievement: 09/02/23 Potential to Achieve Goals: Good ADL Goals Pt Will Perform Lower Body Dressing: with adaptive equipment;with contact guard assist;sit to/from stand Pt Will Transfer to Toilet: with contact guard assist;bedside commode (LRAD) Pt Will Perform Toileting - Clothing Manipulation and hygiene: with modified independence  OT Frequency: Min 1X/week    Co-evaluation PT/OT/SLP Co-Evaluation/Treatment: Yes Reason for Co-Treatment: Complexity of the patient's impairments (multi-system involvement) PT goals addressed during session: Mobility/safety with mobility OT goals addressed during  session: ADL's and self-care      AM-PAC OT "6 Clicks" Daily Activity     Outcome Measure Help from another person eating meals?: None Help from another person taking care of personal grooming?: None Help from another person toileting, which includes using toliet, bedpan, or urinal?: A Little Help from another person bathing (including washing, rinsing, drying)?: A Little Help from  another person to put on and taking off regular upper body clothing?: None Help from another person to put on and taking off regular lower body clothing?: A Little 6 Click Score: 21   End of Session Equipment Utilized During Treatment: Rolling walker (2 wheels) Nurse Communication: Mobility status  Activity Tolerance: Patient tolerated treatment well;Patient limited by pain Patient left: in bed;with call bell/phone within reach;with family/visitor present;with SCD's reapplied  OT Visit Diagnosis: Other abnormalities of gait and mobility (R26.89);Pain Pain - Right/Left: Right Pain - part of body: Leg;Ankle and joints of foot                Time: 1478-2956 OT Time Calculation (min): 29 min Charges:  OT General Charges $OT Visit: 1 Visit OT Evaluation $OT Eval Moderate Complexity: 1 Mod  Arman Filter., MPH, MS, OTR/L ascom [phone] 08/19/23, 12:14 PM

## 2023-08-19 NOTE — Progress Notes (Signed)
Report given to [employer], 2A RN. All VSS. All belongings returned to patient. Patient transferred to 230 via bed. Patient daughters at bedside. No questions or concerns raised by patient, family or receiving staff. Patient care transferred.

## 2023-08-19 NOTE — Progress Notes (Signed)
PHARMACIST - PHYSICIAN COMMUNICATION  CONCERNING: IV to Oral Route Change Policy  RECOMMENDATION: This patient is receiving Pepcid by the intravenous route.  Based on criteria approved by the Pharmacy and Therapeutics Committee, the intravenous medication(s) is/are being converted to the equivalent oral dose form(s).  DESCRIPTION: These criteria include: The patient is eating (either orally or via tube) and/or has been taking other orally administered medications for a least 24 hours The patient has no evidence of active gastrointestinal bleeding or impaired GI absorption (gastrectomy, short bowel, patient on TNA or NPO).  If you have questions about this conversion, please contact the Pharmacy Department    Tressie Ellis, [facility] 08/19/2023 8:10 AM

## 2023-08-20 LAB — GLUCOSE, CAPILLARY
Glucose-Capillary: 129 mg/dL — ABNORMAL HIGH (ref 70–99)
Glucose-Capillary: 137 mg/dL — ABNORMAL HIGH (ref 70–99)
Glucose-Capillary: 154 mg/dL — ABNORMAL HIGH (ref 70–99)
Glucose-Capillary: 122 mg/dL — ABNORMAL HIGH (ref 70–99)

## 2023-08-20 MED ORDER — NICOTINE 7 MG/24HR TD PT24
7.0000 mg | MEDICATED_PATCH | Freq: Every day | TRANSDERMAL | Status: DC
  Filled 2023-08-20: qty 1

## 2023-08-20 NOTE — Plan of Care (Signed)
Problem: Education: Goal: Understanding of CV disease, CV risk reduction, and recovery process will improve Outcome: Progressing Goal: Individualized Educational Video(s) Outcome: Progressing   Problem: Cardiovascular: Goal: Ability to achieve and maintain adequate cardiovascular perfusion will improve Outcome: Progressing Goal: Vascular access site(s) Level 0-1 will be maintained Outcome: Progressing   Problem: Health Behavior/Discharge Planning: Goal: Ability to safely manage health-related needs after discharge will improve Outcome: Progressing

## 2023-08-20 NOTE — Progress Notes (Signed)
Physical Therapy Treatment Patient Details Name: Juan Watson MRN: [id number] DOB: 1951-05-27 Today's Date: 08/20/2023   History of Present Illness Patient is a 72 year old male with atherosclerosis with rest pain and ulceration right lower extremity. S/p Right femoral artery to peroneal artery bypass, right profunda femoris artery endarterectomy and patch angioplasty, Endarterectomy of the right popliteal artery and anterior tibial arteries    PT Comments  Pt was long sitting in bed with supportive family member in room. Per family," He has his days and night mixed up." Pt presents lethargic throughout session but c/o severe 10/10 pain in RLE. Pt was able to exit R side of bed, stand to RW, and took a few shuffling steps at EOB. Pt is unable to tolerate much wt on RLE due to pain. Pt had flexed posture due to R incisional /wound pain. Was able to stand more erect with Vcs but overall still slightly bent forward. Unable to advance activity away from EOB due to pain/limited wt bearing on RLE. Author feels pt will progress quickly once pain is better controlled. DC recs update due to current abilities/safety but expect to revert to original recs once pt demonstrates safe abilities to perform task/ADLs with less assistance. Acute PT will continue to follow per current POC.    If plan is discharge home, recommend the following: A lot of help with walking and/or transfers;A lot of help with bathing/dressing/bathroom;Assistance with cooking/housework;Help with stairs or ramp for entrance;Assist for transportation     Equipment Recommendations  None recommended by PT       Precautions / Restrictions Precautions Precautions: Fall Precaution Comments: wound vac and LLE incisions Restrictions Weight Bearing Restrictions: No     Mobility  Bed Mobility Overal bed mobility: Needs Assistance Bed Mobility: Supine to Sit, Sit to Supine  Supine to sit: Min assist, Used rails, HOB elevated Sit to supine:  Mod assist     Transfers Overall transfer level: Needs assistance Equipment used: Rolling walker (2 wheels) Transfers: Sit to/from Stand Sit to Stand: Min assist     Ambulation/Gait Ambulation/Gait assistance: Min assist Gait Distance (Feet): 3 Feet Assistive device: Rolling walker (2 wheels) Gait Pattern/deviations: Step-to pattern, Antalgic Gait velocity: decreased     General Gait Details: Pt was able to take several steps along EOB but pain severely limited. he mostly kept wt on LLE versus RLE. vcs throughout for posture correction. pt tends to be flexed at hips due to incisional/ wound (vac) pain.    Balance Overall balance assessment: Needs assistance Sitting-balance support: Feet supported Sitting balance-Leahy Scale: Fair     Standing balance support: Bilateral upper extremity supported, During functional activity, Reliant on assistive device for balance Standing balance-Leahy Scale: Fair Standing balance comment: Pt is high risk of falls mostly due to impulsivity/ poor safety awareness       Cognition Arousal: Lethargic Behavior During Therapy: WFL for tasks assessed/performed, Impulsive Overall Cognitive Status: Impaired/Different from baseline (Per family, " He has been up all night and sleeps some throughout the day." per family, , not at his baseline)      General Comments: Pt is awake and fully participates however eyes are clsoed alot and needs encouragement to stay focused on desired task. remains pain limited overall               Pertinent Vitals/Pain Pain Assessment Pain Assessment: 0-10 Pain Score: 10-Worst pain ever Pain Location: R leg Pain Descriptors / Indicators: Discomfort, Grimacing, Guarding, Sore Pain Intervention(s): Limited activity  within patient's tolerance, Monitored during session, Premedicated before session, Repositioned     PT Goals (current goals can now be found in the care plan section) Acute Rehab PT Goals Patient Stated  Goal: pain control, to return home Progress towards PT goals: Not progressing toward goals - comment (pain limited)    Frequency    Min 1X/week       Co-evaluation     PT goals addressed during session: Mobility/safety with mobility;Balance;Proper use of DME;Strengthening/ROM        AM-PAC PT "6 Clicks" Mobility   Outcome Measure  Help needed turning from your back to your side while in a flat bed without using bedrails?: A Little Help needed moving from lying on your back to sitting on the side of a flat bed without using bedrails?: A Little Help needed moving to and from a bed to a chair (including a wheelchair)?: A Lot Help needed standing up from a chair using your arms (e.g., wheelchair or bedside chair)?: A Lot Help needed to walk in hospital room?: A Lot Help needed climbing 3-5 steps with a railing? : A Lot 6 Click Score: 14    End of Session   Activity Tolerance: Patient limited by pain Patient left: in bed;with call bell/phone within reach;with bed alarm set Nurse Communication: Mobility status PT Visit Diagnosis: Difficulty in walking, not elsewhere classified (R26.2);Pain Pain - Right/Left: Right Pain - part of body: Leg     Time: 1050-1106 PT Time Calculation (min) (ACUTE ONLY): 16 min  Charges:    $Therapeutic Activity: 8-22 mins PT General Charges $$ ACUTE PT VISIT: 1 Visit                    Jetta Lout PTA 08/20/23, 1:03 PM

## 2023-08-20 NOTE — Progress Notes (Signed)
  Progress Note    08/20/2023 3:08 PM 2 Days Post-Op  Subjective:  Lenzy Vranich is a 72 yo male who is POD #2 Right femoral artery to peroneal artery bypass with in-situ saphenous vein graft. Patient is recovering as expected. Right lower extremity remains with +1 edema. Patient endorses pain. Patient endorses no working with Physical Therapy or being OOB much. No complaints over night and vitals all remain stable.    Vitals:   08/20/23 0159 08/20/23 0813  BP:  (!) 124/59  Pulse:  88  Resp:  18  Temp:  98.3 F (36.8 C)  SpO2: 93% 96%   Physical Exam: Cardiac:  RRR, Normal S1, S2.  Lungs:  Clear throughout on auscultation. No rales, Rhonchi or wheezing.  Incisions:  Right lower extremity with dressings clean dry and intact.  Extremities:  Right lower extremity with +1 edema. Positive doppler pulses.  Abdomen:  Positive bowel sounds throughout, soft non tender and non distended Neurologic: AAOX4, answers questions and follows commands appropriately  CBC    Component Value Date/Time   WBC 19.8 (H) 08/19/2023 0337   RBC 3.45 (L) 08/19/2023 0337   HGB 10.0 (L) 08/19/2023 0337   HCT 29.1 (L) 08/19/2023 0337   PLT 186 08/19/2023 0337   MCV 84.3 08/19/2023 0337   MCH 29.0 08/19/2023 0337   MCHC 34.4 08/19/2023 0337   RDW 13.1 08/19/2023 0337   LYMPHSABS 1.5 12/02/2020 0812   MONOABS 0.6 12/02/2020 0812   EOSABS 0.6 12/02/2020 0812   BASOSABS 0.1 12/02/2020 0812    BMET    Component Value Date/Time   NA 135 08/19/2023 0337   K 4.3 08/19/2023 0337   CL 106 08/19/2023 0337   CO2 22 08/19/2023 0337   GLUCOSE 157 (H) 08/19/2023 0337   BUN 21 08/19/2023 0337   CREATININE 1.10 08/19/2023 0337   CALCIUM 7.6 (L) 08/19/2023 0337   GFRNONAA >60 08/19/2023 0337   GFRAA >60 02/13/2020 1809    INR    Component Value Date/Time   INR 0.96 04/05/2015 1002     Intake/Output Summary (Last 24 hours) at 08/20/2023 1508 Last data filed at 08/20/2023 1019 Gross per 24 hour   Intake 600 ml  Output 1100 ml  Net -500 ml     Assessment/Plan:  71 y.o. male is s/p Right femoral artery to peroneal artery bypass with in-situ saphenous vein graft.  2 Days Post-Op   PLAN: OOB to bedside chair today for most of the day. Work with PT/OT today.  Pain medication PRN Use Incentive spirometry q1Hr as ordered while awake.  Continue ASA 81 mg daily and Plavix 75 mg Daily.   DVT prophylaxis:  Lovenox 50 mg SQ Q24hrs   Analisse Randle R Atia [facility] 08/20/2023 3:08 PM

## 2023-08-20 NOTE — Progress Notes (Signed)
[facility]  Daily Progress Note   Subjective  -   Having a lot of post op pain. No events overnight.   Objective Vitals:   08/19/23 2035 08/20/23 0137 08/20/23 0159 08/20/23 0813  BP: (!) 108/49 (!) 114/55  (!) 124/59  Pulse: 86 98  88  Resp: 20 20  18   Temp: 98.5 F (36.9 C) (!) 97.5 F (36.4 C)  98.3 F (36.8 C)  TempSrc: Oral Oral    SpO2: 100% (!) 89% 93% 96%  Weight:      Height:        Intake/Output Summary (Last 24 hours) at 08/20/2023 1438 Last data filed at 08/20/2023 1019 Gross per 24 hour  Intake 600 ml  Output 1100 ml  Net -500 ml    PULM  CTAB CV  RRR VASC  Palpable pulse in the bypass and good signals in the foot.  Laboratory CBC    Component Value Date/Time   WBC 19.8 (H) 08/19/2023 0337   HGB 10.0 (L) 08/19/2023 0337   HCT 29.1 (L) 08/19/2023 0337   PLT 186 08/19/2023 0337    BMET    Component Value Date/Time   NA 135 08/19/2023 0337   K 4.3 08/19/2023 0337   CL 106 08/19/2023 0337   CO2 22 08/19/2023 0337   GLUCOSE 157 (H) 08/19/2023 0337   BUN 21 08/19/2023 0337   CREATININE 1.10 08/19/2023 0337   CALCIUM 7.6 (L) 08/19/2023 0337   GFRNONAA >60 08/19/2023 0337   GFRAA >60 02/13/2020 1809    Assessment/Planning: POD #2 s/p right femoral to distal bypass, right femoral endarterectomies  Doing well Appropriate pain PT/OT Mobilize as much as possible Likely 2-3 more days in the hospital  [facility]  08/20/2023, 2:38 PM

## 2023-08-20 NOTE — Progress Notes (Signed)
Mobility Specialist - Progress Note   08/20/23 1424  Mobility  Activity Transferred to/from BSC;Stood at bedside  Level of Assistance Moderate assist, patient does 50-74%  Assistive Device Front wheel walker  Activity Response Tolerated fair  $Mobility charge 1 Mobility  Mobility Specialist Start Time (ACUTE ONLY) 1408  Mobility Specialist Stop Time (ACUTE ONLY) 1422  Mobility Specialist Time Calculation (min) (ACUTE ONLY) 14 min   Pt sitting in the recliner upon entry, utilizing RA. Pt requesting to use the [facility]. Pt STS to RW Max-ModA-- vocal cueing for hand placement on the RW, weight shifting and to bring the RLE closer to person when coming to standing. Pt stood for ~1 min MaxA -- while MS removed the recliner and placed the BSC. Pt left seated on the [facility] with needs within reach and instructions to use call bell when ready. Pt expressed pain throughout activity. RN notified.   Zetta Bills Mobility Specialist 08/20/23 2:37 PM

## 2023-08-20 NOTE — TOC Initial Note (Signed)
Transition of Care [facility]) - Initial/Assessment Note    Patient Details  Name: Juan Watson MRN: [id number] Date of Birth: 01/09/1951  Transition of Care [facility]) CM/SW Contact:    Chapman Fitch, RN Phone Number: 08/20/2023, 4:36 PM  Clinical Narrative:                  Admitted for:  POD #2 Right femoral artery to peroneal artery bypass with in-situ saphenous vein graft  Admitted from: home with wife and daughter PCP: Hyacinth Meeker Pharmacy: Current home health/prior home health/DME:Shower Counsellor (2 wheels);Rollator (4 wheels);Cane - single point;Cane - quad   Today therapy changed recs from [facility] to SNF.  Spoke with patient and daughter Boneta Lucks.  The prefer for patient to go home with home health services.   2 daughters will provide additional support.  If they change their mind and wish to purse SNF they are to let RN know to reach out to [facility]  They state they do note have preference of home health agency.  Referral made to [place] at [facility] vac in place  Expected Discharge Plan: Home w Home Health Services     Patient Goals and CMS Choice            Expected Discharge Plan and Services                                              Prior Living Arrangements/Services                       Activities of Daily Living Home Assistive Devices/Equipment: Cane (specify quad or straight), CBG Meter, Dentures (specify type), Walker (specify type), Blood pressure cuff ADL Screening (condition at time of admission) Patient's cognitive ability adequate to safely complete daily activities?: Yes Is the patient deaf or have difficulty hearing?: No Does the patient have difficulty seeing, even when wearing glasses/contacts?: No Does the patient have difficulty concentrating, remembering, or making decisions?: No Patient able to express need for assistance with ADLs?: Yes Does the patient have difficulty dressing or bathing?: No Independently  performs ADLs?: Yes (appropriate for developmental age) Does the patient have difficulty walking or climbing stairs?: Yes Weakness of Legs: Both Weakness of Arms/Hands: None  Permission Sought/Granted                  Emotional Assessment              Admission diagnosis:  Atherosclerosis of artery of extremity with rest pain [facility]) [I70.229] Patient Active Problem List   Diagnosis Date Noted   Atherosclerosis of artery of extremity with rest pain (HCC) 08/18/2023   Atherosclerosis of native arteries of the extremities with ulceration (HCC) 07/25/2023   Atherosclerosis of native arteries of extremity with rest pain (HCC) 06/21/2023   PAD (peripheral artery disease) (HCC) 02/07/2021   Diabetes (HCC) 10/16/2020   Tobacco use disorder 10/16/2020   Mixed hyperlipidemia 10/16/2020   Elevated TSH 08/11/2020   Atherosclerosis of native arteries of extremity with intermittent claudication (HCC) 07/29/2020   Coronary artery disease involving native coronary artery of native heart 04/25/2020   S/P drug eluting coronary stent placement 04/11/2020   Angina pectoris (HCC) 04/05/2020   FH: colon cancer in relative diagnosed at >76 years old 06/30/2019   Nocturnal muscle cramps 04/05/2018   Status post arthroscopy  12/28/2017   Arthrofibrosis of total knee arthroplasty, initial encounter (HCC) 12/16/2017   Adhesive capsulitis of knee, right 11/01/2017   Cholecystitis 06/28/2017   Aortic valve sclerosis 11/04/2016   Grief reaction 07/31/2016   Acquired right flat foot 11/15/2015   Arthritis of knee, degenerative 04/15/2015   Primary osteoarthritis of right knee 04/14/2015   Skin lesion 03/11/2015   Burning or prickling sensation 10/29/2014   Encounter for screening for malignant neoplasm of prostate 10/29/2014   Familial aortic aneurysm 03/26/2014   Gout 03/26/2014   Family history of cardiovascular disease 03/26/2014   Gastro-esophageal reflux disease without esophagitis  08/11/2013   Generalized OA 08/11/2013   Disease of nail 07/16/2011   Paronychia of toe 07/16/2011   Benign essential HTN 10/17/2010   PCP:  Danella Penton, MD Pharmacy:   [facility] - [place], [place] - [address] [place] [address] Phone: [phone] Fax: [phone]  [facility] [phone] [place], [place] - [address] [place] [address] Phone: [phone] Fax: [phone]     Social Determinants of Health (SDOH) Social History: SDOH Screenings   Food Insecurity: No Food Insecurity (08/18/2023)  Housing: Low Risk  (08/18/2023)  Transportation Needs: No Transportation Needs (08/18/2023)  Utilities: Not At Risk (08/18/2023)  Depression (PHQ2-9): Low Risk  (08/08/2020)  Financial Resource Strain: Low Risk  (02/23/2023)   [facility] from [facility], [facility]  Social Connections: Unknown (06/11/2020)  Stress: No Stress Concern Present (06/11/2020)  Tobacco Use: High Risk (08/18/2023)   SDOH Interventions:     Readmission Risk Interventions     No data to display

## 2023-08-20 NOTE — Care Management Important Message (Signed)
Important Message  Patient Details  Name: Juan Watson MRN: [id number] Date of Birth: 03-17-1951   Medicare Important Message Given:  N/A - LOS <3 / Initial given by admissions     Johnell Comings 08/20/2023, 8:29 AM

## 2023-08-21 LAB — COMPREHENSIVE METABOLIC PANEL
ALT: 58 U/L — ABNORMAL HIGH (ref 0–44)
AST: 51 U/L — ABNORMAL HIGH (ref 15–41)
Albumin: 3.7 g/dL (ref 3.5–5.0)
Alkaline Phosphatase: 40 U/L (ref 38–126)
Anion gap: 9 (ref 5–15)
BUN: 16 mg/dL (ref 8–23)
CO2: 25 mmol/L (ref 22–32)
Calcium: 8.3 mg/dL — ABNORMAL LOW (ref 8.9–10.3)
Chloride: 99 mmol/L (ref 98–111)
Creatinine, Ser: 1.15 mg/dL (ref 0.61–1.24)
GFR, Estimated: >60 mL/min (ref >60)
Glucose, Bld: 145 mg/dL — ABNORMAL HIGH (ref 70–99)
Potassium: 3.5 mmol/L (ref 3.5–5.1)
Sodium: 133 mmol/L — ABNORMAL LOW (ref 135–145)
Total Bilirubin: 0.9 mg/dL (ref 0.3–1.2)
Total Protein: 6.5 g/dL (ref 6.5–8.1)

## 2023-08-21 LAB — CBC
HCT: 29.8 % — ABNORMAL LOW (ref 39.0–52.0)
Hemoglobin: 10 g/dL — ABNORMAL LOW (ref 13.0–17.0)
MCH: 29 pg (ref 26.0–34.0)
MCHC: 33.6 g/dL (ref 30.0–36.0)
MCV: 86.4 fL (ref 80.0–100.0)
Platelets: 220 10*3/uL (ref 150–400)
RBC: 3.45 MIL/uL — ABNORMAL LOW (ref 4.22–5.81)
RDW: 13.2 % (ref 11.5–15.5)
WBC: 14.3 10*3/uL — ABNORMAL HIGH (ref 4.0–10.5)
nRBC: 0 % (ref 0.0–0.2)

## 2023-08-21 LAB — GLUCOSE, CAPILLARY
Glucose-Capillary: 166 mg/dL — ABNORMAL HIGH (ref 70–99)
Glucose-Capillary: 120 mg/dL — ABNORMAL HIGH (ref 70–99)
Glucose-Capillary: 156 mg/dL — ABNORMAL HIGH (ref 70–99)

## 2023-08-21 NOTE — Progress Notes (Signed)
3 Days Post-Op   Subjective/Chief Complaint: Doing Ok today. Tolerating OOB/PT. Pain controlled. Notes constipation   Objective: Vital signs in last 24 hours: Temp:  [98.1 F (36.7 C)-98.8 F (37.1 C)] 98.1 F (36.7 C) (09/21 0746) Pulse Rate:  [86-95] 94 (09/21 0746) Resp:  [15-20] 16 (09/21 0746) BP: (103-132)/(53-60) 117/53 (09/21 0746) SpO2:  [92 %-95 %] 95 % (09/21 0746) Last BM Date : 08/17/23 (per patient)  Intake/Output from previous day: 09/20 0701 - 09/21 0700 In: 120 [P.O.:120] Out: 550 [Urine:550] Intake/Output this shift: No intake/output data recorded.  General appearance: alert and no distress Resp: clear to auscultation bilaterally Cardio: regular rate and rhythm Extremities: Right lower extremity- 1+edema, Prevena in place with good seal, foot warm, +Pedal doppler  Lab Results:  Recent Labs    08/18/23 2043 08/19/23 0337  WBC 19.2* 19.8*  HGB 11.6* 10.0*  HCT 34.3* 29.1*  PLT 198 186   BMET Recent Labs    08/18/23 2043 08/19/23 0337  NA  --  135  K  --  4.3  CL  --  106  CO2  --  22  GLUCOSE  --  157*  BUN  --  21  CREATININE 1.07 1.10  CALCIUM  --  7.6*   PT/INR No results for input(s): "LABPROT", "INR" in the last 72 hours. ABG No results for input(s): "PHART", "HCO3" in the last 72 hours.  Invalid input(s): "PCO2", "PO2"  Studies/Results: No results found.  Anti-infectives: Anti-infectives (From admission, onward)    Start     Dose/Rate Route Frequency Ordered Stop   08/19/23 0000  ceFAZolin (ANCEF) IVPB 2g/100 mL premix        2 g 200 mL/hr over 30 Minutes Intravenous Every 8 hours 08/18/23 1950 08/20/23 0700   08/18/23 1000  ceFAZolin (ANCEF) IVPB 2g/100 mL premix        2 g 200 mL/hr over 30 Minutes Intravenous On call to O.R. 08/18/23 0953 08/18/23 1617       Assessment/Plan: s/p Procedure(s): BYPASS GRAFT FEMORAL-TIBIAL ARTERY (WITH VEIN) (Right) APPLICATION OF CELL SAVER (Right) ENDARTERECTOMY POPLITEAL  (Right) ENDARTERECTOMY FEMORAL (Right) ENDARTERECTOMY TIBIOPERONEAL (Right) POD #3  Continue OOB with PT Bowel regimen ASA/Plavix Probable home on Monday.   LOS: 3 days    Bertram Denver 08/21/2023

## 2023-08-22 DIAGNOSIS — I70229 Atherosclerosis of native arteries of extremities with rest pain, unspecified extremity: Secondary | ICD-10-CM

## 2023-08-22 LAB — GLUCOSE, CAPILLARY
Glucose-Capillary: 189 mg/dL — ABNORMAL HIGH (ref 70–99)
Glucose-Capillary: 128 mg/dL — ABNORMAL HIGH (ref 70–99)
Glucose-Capillary: 111 mg/dL — ABNORMAL HIGH (ref 70–99)
Glucose-Capillary: 187 mg/dL — ABNORMAL HIGH (ref 70–99)

## 2023-08-22 LAB — TSH: TSH: 5.07 u[IU]/mL — ABNORMAL HIGH (ref 0.350–4.500)

## 2023-08-22 NOTE — Plan of Care (Signed)
Problem: Activity: Goal: Ability to return to baseline activity level will improve Outcome: Progressing   Problem: Cardiovascular: Goal: Ability to achieve and maintain adequate cardiovascular perfusion will improve Outcome: Progressing   Problem: Education: Goal: Knowledge of General Education information will improve Description: Including pain rating scale, medication(s)/side effects and non-pharmacologic comfort measures Outcome: Progressing

## 2023-08-22 NOTE — Consult Note (Signed)
Initial Consultation Note   Patient: Juan Watson DOB: 11/04/1951 PCP: Danella Penton, MD DOA: 08/18/2023 DOS: the patient was seen and examined on 08/22/2023 Primary service: Renford Dills, MD  Referring physician: Dr. Evie Lacks Reason for consult: BP meds manangement  Assessment/Plan:  HTN -Review of patient's medication list, appears that the patient taking 4 different p.o. BP meds, including amlodipine, clonidine, labetalol, and ARB, but all of them are at low doses.  Decision is to hold or BP meds, and resume once BP meds at this time, and we will try to maximize single BP meds before starting with next BP meds.  Explained the strategy to patient and family at bedside, both expressed understanding and agreed. -Continue PRN BP meds  Heart murmur -Clinically suspect aortic stenosis patient denied any lightheadedness shortness of breath or chest pains. -Will order echocardiogram  PVD -As per primary team  IIDM -Stable, continue current diabetic medication regimen  CAD with Hx of stenting -No acute concerns  TRH will continue to follow the patient.  HPI: Juan Watson is a 72 y.o. male with past medical history of HTN, IIDM, PVD came to [facility] for elective right femoral artery to peroneal artery bypass with vein graft.  Patient tolerated procedure well.  During hospital stay it was noticed the patient blood pressure has been on the lower side with SBP 110.  Chronically patient has been taking for blood pressure medications to control BP including amlodipine, clonidine, labetalol and telmisartan.  Patient reported that he does have a pressure cuff at home and wife checked blood pressures occasionally and SBP 120-130s range.  He denies any chest pain headache lightheadedness or shortness of breath at baseline.  Last week he went to see cardiology for preop clearance and he was told that he has a new murmur.  His most recent echo was 3 years ago showed no significant  valvular issue.  Review of Systems: As mentioned in the history of present illness. All other systems reviewed and are negative. Past Medical History:  Diagnosis Date   Acquired flat foot 11/15/2015   Arthritis of knee, degenerative 04/15/2015   BPH (benign prostatic hyperplasia)    Chewing tobacco nicotine dependence without complication 08/10/2020   Cholecystitis 06/28/2017   Coronary artery disease    Diabetes mellitus without complication (HCC)    Diabetes mellitus, type 2 (HCC) 12/13/2002   Overview:  DIET CONTROLLED    Disease of nail 07/16/2011   Encounter for screening for malignant neoplasm of prostate 10/29/2014   Essential (primary) hypertension 10/17/2010   Familial aortic aneurysm 03/26/2014   Family history of cardiovascular disease 03/26/2014   Gastro-esophageal reflux disease without esophagitis 08/11/2013   Generalized OA 08/11/2013   Gout 03/26/2014   Heart murmur    HOH (hard of hearing)    aids   Motion sickness    "sea sick"   Peripheral vascular disease (HCC)    Primary osteoarthritis of right knee 04/14/2015   Pure hypercholesterolemia 12/13/2002   Skin lesion 03/11/2015   Past Surgical History:  Procedure Laterality Date   BACK SURGERY  77,87   CARDIAC CATHETERIZATION  70's   pericarditis   CATARACT EXTRACTION W/PHACO Right 03/23/2016   Procedure: CATARACT EXTRACTION PHACO AND INTRAOCULAR LENS PLACEMENT (IOC);  Surgeon: Sallee Lange, MD;  Location: [facility] ORS;  Service: Ophthalmology;  Laterality: Right;  [place] 01:31    CATARACT EXTRACTION W/PHACO Left 10/23/2019   Procedure: CATARACT EXTRACTION PHACO AND INTRAOCULAR LENS PLACEMENT (IOC) LEFT DIABETIC TORIC  LENS 5.82,   00:44.1;  Surgeon: Nevada Crane, MD;  Location: [facility];  Service: Ophthalmology;  Laterality: Left;  Diabetic - oral meds   CHOLECYSTECTOMY N/A 06/29/2017   Procedure: LAPAROSCOPIC CHOLECYSTECTOMY;  Surgeon: Henrene Dodge, MD;  Location: [facility] ORS;  Service: General;   Laterality: N/A;   COLONOSCOPY WITH PROPOFOL N/A 10/06/2017   Procedure: COLONOSCOPY WITH PROPOFOL;  Surgeon: Scot Jun, MD;  Location: [facility] END[facility]COPY;  Service: Endoscopy;  Laterality: N/A;   CORONARY STENT INTERVENTION N/A 04/11/2020   Procedure: CORONARY STENT INTERVENTION;  Surgeon: Alwyn Pea, MD;  Location: [facility] INVASIVE CV LAB;  Service: Cardiovascular;  Laterality: N/A;   ENDARTERECTOMY FEMORAL Right 08/18/2023   Procedure: ENDARTERECTOMY FEMORAL;  Surgeon: Renford Dills, MD;  Location: [facility] ORS;  Service: Vascular;  Laterality: Right;   ENDARTERECTOMY POPLITEAL Right 08/18/2023   Procedure: ENDARTERECTOMY POPLITEAL;  Surgeon: Renford Dills, MD;  Location: [facility] ORS;  Service: Vascular;  Laterality: Right;   ENDARTERECTOMY TIBIOPERONEAL Right 08/18/2023   Procedure: ENDARTERECTOMY TIBIOPERONEAL;  Surgeon: Renford Dills, MD;  Location: [facility] ORS;  Service: Vascular;  Laterality: Right;   FEMORAL-TIBIAL BYPASS GRAFT Right 08/18/2023   Procedure: BYPASS GRAFT FEMORAL-TIBIAL ARTERY (WITH VEIN);  Surgeon: Renford Dills, MD;  Location: [facility] ORS;  Service: Vascular;  Laterality: Right;   HERNIA REPAIR  11,   umbilical x2   KNEE ARTHROSCOPY Right 12/20/2017   Procedure: ARTHROSCOPY KNEE EXTENSIVE SYNOVECTOMY AND LYSIS OF ADHESIONS;  Surgeon: Donato Heinz, MD;  Location: [facility] ORS;  Service: Orthopedics;  Laterality: Right;   KNEE DEBRIDEMENT Left    tendon   LEFT HEART CATH AND CORONARY ANGIOGRAPHY Left 04/11/2020   Procedure: LEFT HEART CATH AND CORONARY ANGIOGRAPHY;  Surgeon: Lamar Blinks, MD;  Location: [facility] INVASIVE CV LAB;  Service: Cardiovascular;  Laterality: Left;   LOWER EXTREMITY ANGIOGRAPHY Left 09/24/2020   Procedure: LOWER EXTREMITY ANGIOGRAPHY;  Surgeon: Renford Dills, MD;  Location: [facility] INVASIVE CV LAB;  Service: Cardiovascular;  Laterality: Left;   LOWER EXTREMITY ANGIOGRAPHY Right 07/13/2023   Procedure: Lower Extremity Angiography;   Surgeon: Renford Dills, MD;  Location: [facility] INVASIVE CV LAB;  Service: Cardiovascular;  Laterality: Right;   TONSILLECTOMY  72   TOTAL KNEE ARTHROPLASTY Right 04/15/2015   Procedure: TOTAL KNEE ARTHROPLASTY;  Surgeon: Gean Birchwood, MD;  Location: [facility];  Service: Orthopedics;  Laterality: Right;   TRANSURETHRAL RESECTION OF PROSTATE  ?   Social History:  reports that he quit smoking about 31 years ago. His smoking use included cigarettes. He started smoking about 56 years ago. He has a 50 pack-year smoking history. His smokeless tobacco use includes chew. He reports that he does not drink alcohol and does not use drugs.  Allergies  Allergen Reactions   Bee Venom Shortness Of Breath and Swelling   Sulfa Antibiotics Swelling    Tongue swelling   Ben Gay Ultra [Menthol] Other (See Comments)    Whelts and redness of skin    Family History  Problem Relation Age of Onset   Multiple myeloma Mother    Aneurysm Father    Aneurysm Brother    Nephrolithiasis Neg Hx    Bladder Cancer Neg Hx    Prostate cancer Neg Hx    Kidney cancer Neg Hx     Prior to Admission medications   Medication Sig Start Date End Date Taking? Authorizing Provider  aspirin EC 81 MG tablet Take 81 mg by mouth at bedtime.   Yes  [provider]  chlorthalidone (HYGROTON) 25 MG tablet Take 25 mg by mouth daily.   Yes [provider]  cloNIDine (CATAPRES) 0.1 MG tablet TAKE 1 TABLET TWICE DAILY Patient taking differently: Take 0.1 mg by mouth 2 (two) times daily. 08/28/20  Yes Theadore Nan, NP  Febuxostat 80 MG TABS Take 80 mg by mouth daily with supper.   Yes [provider]  fluticasone (FLONASE) 50 MCG/ACT nasal spray Place 1 spray into both nostrils in the morning and at bedtime. 07/27/23  Yes [provider]  glucose blood test strip Use as instructed 04/30/20  Yes Amedeo Kinsman A, NP  labetalol (NORMODYNE) 200 MG tablet TAKE 1 TABLET TWICE DAILY 05/20/21  Yes Allegra Grana, FNP  levothyroxine (SYNTHROID) 75 MCG tablet Take 75 mcg by mouth daily before breakfast. 08/04/23 08/03/24 Yes [provider]  magnesium oxide (MAG-OX) 400 MG tablet Take 400 mg by mouth daily.   Yes [provider]  pantoprazole (PROTONIX) 40 MG tablet Take 40 mg by mouth daily.   Yes [provider]  pregabalin (LYRICA) 50 MG capsule Take 50 mg by mouth at bedtime.   Yes [provider]  rosuvastatin (CRESTOR) 10 MG tablet Take 1 tablet (10 mg total) by mouth daily. Patient taking differently: Take 10 mg by mouth daily with supper. 11/08/20  Yes Glori Luis, MD  telmisartan (MICARDIS) 80 MG tablet Take 80 mg by mouth daily with supper.  05/13/20 07/18/24 Yes [provider]  acetaminophen (TYLENOL) 500 MG tablet Take 1,000 mg by mouth every 6 (six) hours as needed for mild pain or headache.    [provider]  amLODipine (NORVASC) 10 MG tablet Take 1 tablet (10 mg total) by mouth daily. Patient taking differently: Take 2.5 mg by mouth daily. 12/17/20   Dale Hume, MD  ascorbic acid (VITAMIN C) 500 MG tablet Take 500 mg by mouth daily with supper.    [provider]  cholecalciferol (VITAMIN D3) 25 MCG (1000 UNIT) tablet Take 1,000 Units by mouth daily with supper.    [provider]  Coenzyme Q10 (CO Q-10) 100 MG CAPS Take 200 mg by mouth daily with supper.    [provider]  diclofenac (VOLTAREN) 75 MG EC tablet Take 1 tablet (75 mg total) by mouth 2 (two) times daily as needed. Patient taking differently: Take 75 mg by mouth daily as needed. 02/03/21   Allegra Grana, FNP  metFORMIN (GLUCOPHAGE) 500 MG tablet Take 2 tablets (1,000 mg total) by mouth 2 (two) times daily with a meal. 10/04/20   Theadore Nan, NP  MOUNJARO 7.5 MG/0.5ML Pen Inject 7.5 mg into the skin once a week. Monday weekly    [provider]  Omega-3 Fatty Acids (FISH OIL) 1200 MG CAPS Take 1,200 mg by mouth 2 (two) times  daily.    [provider]    Physical Exam: Vitals:   08/21/23 2031 08/22/23 0435 08/22/23 0833 08/22/23 1124  BP: 135/68 130/60 (!) 117/55 118/62  Pulse: 94 83 86 78  Resp: 18 20 20    Temp: 98.2 F (36.8 C) 98.3 F (36.8 C) (!) 97.3 F (36.3 C)   TempSrc:   Oral   SpO2: 98% 94% 98% 98%  Weight:      Height:       Subjective:  Eyes: PERRL, lids and conjunctivae normal ENMT: Mucous membranes are moist. Posterior pharynx clear of any exudate or lesions.Normal dentition.  Neck: normal,  supple, no masses, no thyromegaly Respiratory: clear to auscultation bilaterally, no wheezing, no crackles. Normal respiratory effort. No accessory muscle use.  Cardiovascular: Regular rate and rhythm, no murmurs / rubs / gallops. No extremity edema. 2+ pedal pulses. No carotid bruits.  Abdomen: no tenderness, no masses palpated. No hepatosplenomegaly. Bowel sounds positive.  Musculoskeletal: no clubbing / cyanosis. No joint deformity upper and lower extremities. Good ROM, no contractures. Normal muscle tone.  Skin: no rashes, lesions, ulcers. No induration Neurologic: CN 2-12 grossly intact. Sensation intact, DTR normal.  Slight weakness on left side compared to right side Psychiatric: Normal judgment and insight. Alert and oriented x 3. Normal mood.   Data Reviewed:   There are no new results to review at this time.   Family Communication: Daughter and son at bedside Primary team communication: Vascular surgery team communicated Thank you very much for involving [place] in the care of your patient.  Author: Emeline General, MD 08/22/2023 12:39 PM  For on call review [email or web address].

## 2023-08-22 NOTE — Progress Notes (Signed)
4 Days Post-Op   Subjective/Chief Complaint: Doing well. Ambulating with assist. Pain controlled. +BM this morning. BP remains soft. Asymptomatic.   Objective: Vital signs in last 24 hours: Temp:  [97.3 F (36.3 C)-98.5 F (36.9 C)] 97.3 F (36.3 C) (09/22 0833) Pulse Rate:  [83-98] 86 (09/22 0833) Resp:  [16-20] 20 (09/22 0833) BP: (117-135)/(55-68) 117/55 (09/22 0833) SpO2:  [94 %-98 %] 98 % (09/22 0833) Last BM Date : 08/21/23  Intake/Output from previous day: 09/21 0701 - 09/22 0700 In: 720 [P.O.:720] Out: 650 [Urine:650] Intake/Output this shift: No intake/output data recorded.  General appearance: alert and no distress Resp: clear to auscultation bilaterally Cardio: regular rate and rhythm Extremities: Right  extremity- 1+ edema, Prevena in place, palpable pulse in graft, +pedal doppler  Lab Results:  Recent Labs    08/21/23 1007  WBC 14.3*  HGB 10.0*  HCT 29.8*  PLT 220   BMET Recent Labs    08/21/23 1007  NA 133*  K 3.5  CL 99  CO2 25  GLUCOSE 145*  BUN 16  CREATININE 1.15  CALCIUM 8.3*   PT/INR No results for input(s): "LABPROT", "INR" in the last 72 hours. ABG No results for input(s): "PHART", "HCO3" in the last 72 hours.  Invalid input(s): "PCO2", "PO2"  Studies/Results: No results found.  Anti-infectives: Anti-infectives (From admission, onward)    Start     Dose/Rate Route Frequency Ordered Stop   08/19/23 0000  ceFAZolin (ANCEF) IVPB 2g/100 mL premix        2 g 200 mL/hr over 30 Minutes Intravenous Every 8 hours 08/18/23 1950 08/20/23 0700   08/18/23 1000  ceFAZolin (ANCEF) IVPB 2g/100 mL premix        2 g 200 mL/hr over 30 Minutes Intravenous On call to O.R. 08/18/23 0953 08/18/23 1617       Assessment/Plan: s/p Procedure(s): BYPASS GRAFT FEMORAL-TIBIAL ARTERY (WITH VEIN) (Right) APPLICATION OF CELL SAVER (Right) ENDARTERECTOMY POPLITEAL (Right) ENDARTERECTOMY FEMORAL (Right) ENDARTERECTOMY TIBIOPERONEAL (Right) POD  #4  Will consult Hospitalist for Antihypertensive regimen- appreciate care and input Possible home later today or tomorrow with Home health   LOS: 4 days    Eli Hose A 08/22/2023

## 2023-08-22 NOTE — Progress Notes (Signed)
Physical Therapy Treatment Patient Details Name: Juan Watson MRN: [id number] DOB: December 07, 1950 Today's Date: 08/22/2023   History of Present Illness Patient is a 72 year old male with atherosclerosis with rest pain and ulceration right lower extremity. S/p Right femoral artery to peroneal artery bypass, right profunda femoris artery endarterectomy and patch angioplasty, Endarterectomy of the right popliteal artery and anterior tibial arteries    PT Comments  Pt is making good progress towards goals, updated dispo. Pt agreeable. Discussed stair training for home entry, as pt too fatigue/painful for attempt this date. Improved ambulation in hallway with RW. Slight R foot drop noted. Pt prefers to ambulate with rollater at home. Will continue to progress. Pt is hopeful for dc tomorrow.    If plan is discharge home, recommend the following: A little help with walking and/or transfers   Can travel by private vehicle     Yes  Equipment Recommendations  None recommended by PT    Recommendations for Other Services       Precautions / Restrictions Precautions Precautions: Fall Precaution Comments: wound vac and RLE incisions Restrictions Weight Bearing Restrictions: No     Mobility  Bed Mobility Overal bed mobility: Needs Assistance Bed Mobility: Supine to Sit, Sit to Supine     Supine to sit: Supervision Sit to supine: Supervision   General bed mobility comments: much improved technique this date with no physical assistance required for mobility    Transfers Overall transfer level: Needs assistance Equipment used: Rolling walker (2 wheels) Transfers: Sit to/from Stand Sit to Stand: Supervision           General transfer comment: safe technique and able to rise from low surface. Once standing, wide BOS and upright posture. RW adjusted to pt height    Ambulation/Gait Ambulation/Gait assistance: Supervision Gait Distance (Feet): 200 Feet Assistive device: Rolling walker  (2 wheels) Gait Pattern/deviations: Step-to pattern, Antalgic       General Gait Details: ambulated around hallway with slow gait speed and antalgic pattern. Does have problems with DF during swing phase on R LE, reports acute since surgery. Cues for toe clearance   Stairs             Wheelchair Mobility     Tilt Bed    Modified Rankin (Stroke Patients Only)       Balance Overall balance assessment: Needs assistance Sitting-balance support: Feet supported Sitting balance-Leahy Scale: Good     Standing balance support: Bilateral upper extremity supported, During functional activity, Reliant on assistive device for balance Standing balance-Leahy Scale: Fair                              Cognition Arousal: Alert Behavior During Therapy: WFL for tasks assessed/performed, Impulsive Overall Cognitive Status: Within Functional Limits for tasks assessed                                 General Comments: pleasant and agreeable to session        Exercises Other Exercises Other Exercises: supine ther-ex performed on B LE including SLRs, hip abd/add, and SAQ. All ther-ex performed x 10 reps on R LE with supervision    General Comments        Pertinent Vitals/Pain Pain Assessment Pain Assessment: Faces Faces Pain Scale: Hurts even more Pain Location: R leg Pain Descriptors / Indicators: Discomfort, Grimacing, Guarding, Sore Pain  Intervention(s): Limited activity within patient's tolerance, Repositioned    Home Living                          Prior Function            PT Goals (current goals can now be found in the care plan section) Acute Rehab PT Goals Patient Stated Goal: pain control, to return home PT Goal Formulation: With patient Time For Goal Achievement: 08/26/23 Potential to Achieve Goals: Good Progress towards PT goals: Progressing toward goals    Frequency    Min 1X/week      PT Plan       Co-evaluation              AM-PAC PT "6 Clicks" Mobility   Outcome Measure  Help needed turning from your back to your side while in a flat bed without using bedrails?: None Help needed moving from lying on your back to sitting on the side of a flat bed without using bedrails?: None Help needed moving to and from a bed to a chair (including a wheelchair)?: None Help needed standing up from a chair using your arms (e.g., wheelchair or bedside chair)?: None Help needed to walk in hospital room?: A Little Help needed climbing 3-5 steps with a railing? : A Lot 6 Click Score: 21    End of Session   Activity Tolerance: Patient limited by pain Patient left: in bed;with family/visitor present (per patient request) Nurse Communication: Mobility status PT Visit Diagnosis: Difficulty in walking, not elsewhere classified (R26.2);Pain Pain - Right/Left: Right Pain - part of body: Leg     Time: 4010-2725 PT Time Calculation (min) (ACUTE ONLY): 25 min  Charges:    $Gait Training: 8-22 mins $Therapeutic Exercise: 8-22 mins PT General Charges $$ ACUTE PT VISIT: 1 Visit                     Elizabeth Palau, PT, DPT, GCS [phone]    Gal Smolinski 08/22/2023, 10:36 AM

## 2023-08-23 ENCOUNTER — Inpatient Hospital Stay
Admission: RE | Admit: 2023-08-23 | Discharge: 2023-08-23 | Disposition: A | Discharge status: Home / Self Care | Payer: Medicare PPO | Source: Ambulatory Visit | Attending: Internal Medicine | Admitting: Vascular Surgery

## 2023-08-23 LAB — ECHOCARDIOGRAM COMPLETE
AR max vel: 2.24 cm2
AV Area VTI: 2.36 cm2
AV Area mean vel: 2.05 cm2
AV Mean grad: 6.4 mmHg
AV Peak grad: 10.8 mmHg
Ao pk vel: 1.64 m/s
Area-P 1/2: 3.95 cm2
Height: 67 in
MV VTI: 2.42 cm2
S' Lateral: 3.3 cm
Weight: 3643.76 oz

## 2023-08-23 LAB — GLUCOSE, CAPILLARY
Glucose-Capillary: 131 mg/dL — ABNORMAL HIGH (ref 70–99)
Glucose-Capillary: 156 mg/dL — ABNORMAL HIGH (ref 70–99)

## 2023-08-23 MED ORDER — OXYCODONE-ACETAMINOPHEN 5-325 MG PO TABS: 1.0000 | ORAL_TABLET | ORAL | 0 refills | Status: DC | PRN

## 2023-08-23 MED ORDER — CLOPIDOGREL BISULFATE 75 MG PO TABS: 75.0000 mg | ORAL_TABLET | Freq: Every day | ORAL | 11 refills | Status: AC

## 2023-08-23 NOTE — Plan of Care (Signed)
Problem: Education: Goal: Understanding of CV disease, CV risk reduction, and recovery process will improve Outcome: Progressing Goal: Individualized Educational Video(s) Outcome: Progressing   Problem: Activity: Goal: Ability to return to baseline activity level will improve Outcome: Progressing   Problem: Cardiovascular: Goal: Ability to achieve and maintain adequate cardiovascular perfusion will improve Outcome: Progressing Goal: Vascular access site(s) Level 0-1 will be maintained Outcome: Progressing   Problem: Health Behavior/Discharge Planning: Goal: Ability to safely manage health-related needs after discharge will improve Outcome: Progressing   Problem: Education: Goal: Knowledge of General Education information will improve Description: Including pain rating scale, medication(s)/side effects and non-pharmacologic comfort measures Outcome: Progressing   Problem: Health Behavior/Discharge Planning: Goal: Ability to manage health-related needs will improve Outcome: Progressing   Problem: Clinical Measurements: Goal: Ability to maintain clinical measurements within normal limits will improve Outcome: Progressing Goal: Will remain free from infection Outcome: Progressing Goal: Diagnostic test results will improve Outcome: Progressing Goal: Respiratory complications will improve Outcome: Progressing Goal: Cardiovascular complication will be avoided Outcome: Progressing   Problem: Activity: Goal: Risk for activity intolerance will decrease Outcome: Progressing   Problem: Nutrition: Goal: Adequate nutrition will be maintained Outcome: Progressing   Problem: Coping: Goal: Level of anxiety will decrease Outcome: Progressing   Problem: Elimination: Goal: Will not experience complications related to bowel motility Outcome: Progressing Goal: Will not experience complications related to urinary retention Outcome: Progressing   Problem: Pain Managment: Goal:  General experience of comfort will improve Outcome: Progressing   Problem: Safety: Goal: Ability to remain free from injury will improve Outcome: Progressing   Problem: Skin Integrity: Goal: Risk for impaired skin integrity will decrease Outcome: Progressing   Problem: Education: Goal: Ability to describe self-care measures that may prevent or decrease complications (Diabetes Survival Skills Education) will improve Outcome: Progressing Goal: Individualized Educational Video(s) Outcome: Progressing   Problem: Coping: Goal: Ability to adjust to condition or change in health will improve Outcome: Progressing   Problem: Fluid Volume: Goal: Ability to maintain a balanced intake and output will improve Outcome: Progressing   Problem: Health Behavior/Discharge Planning: Goal: Ability to identify and utilize available resources and services will improve Outcome: Progressing Goal: Ability to manage health-related needs will improve Outcome: Progressing   Problem: Metabolic: Goal: Ability to maintain appropriate glucose levels will improve Outcome: Progressing   Problem: Nutritional: Goal: Maintenance of adequate nutrition will improve Outcome: Progressing Goal: Progress toward achieving an optimal weight will improve Outcome: Progressing   Problem: Education: Goal: Knowledge of General Education information will improve Description: Including pain rating scale, medication(s)/side effects and non-pharmacologic comfort measures Outcome: Progressing   Problem: Health Behavior/Discharge Planning: Goal: Ability to manage health-related needs will improve Outcome: Progressing   Problem: Clinical Measurements: Goal: Ability to maintain clinical measurements within normal limits will improve Outcome: Progressing Goal: Will remain free from infection Outcome: Progressing Goal: Diagnostic test results will improve Outcome: Progressing Goal: Respiratory complications will  improve Outcome: Progressing Goal: Cardiovascular complication will be avoided Outcome: Progressing   Problem: Activity: Goal: Risk for activity intolerance will decrease Outcome: Progressing   Problem: Nutrition: Goal: Adequate nutrition will be maintained Outcome: Progressing   Problem: Coping: Goal: Level of anxiety will decrease Outcome: Progressing   Problem: Elimination: Goal: Will not experience complications related to bowel motility Outcome: Progressing Goal: Will not experience complications related to urinary retention Outcome: Progressing   Problem: Pain Managment: Goal: General experience of comfort will improve Outcome: Progressing   Problem: Safety: Goal: Ability to remain free from injury will improve  Outcome: Progressing   Problem: Skin Integrity: Goal: Risk for impaired skin integrity will decrease Outcome: Progressing

## 2023-08-23 NOTE — TOC Transition Note (Signed)
Transition of Care [facility]) - CM/SW Discharge Note   Patient Details  Name: Juan Watson MRN: [id number] Date of Birth: October 25, 1951  Transition of Care [facility]) CM/SW Contact:  Darolyn Rua, LCSW Phone Number: 08/23/2023, 9:56 AM   Clinical Narrative:     Patient to discharge with [facility] today, has all dme at home. [place] with [facility] informed of dc.   No additional needs at this time.   Final next level of care: Home w Home Health Services Barriers to Discharge: No Barriers Identified   Patient Goals and CMS Choice CMS Medicare.gov Compare Post Acute Care list provided to:: Patient Choice offered to / list presented to : Patient  Discharge Placement                         Discharge Plan and Services Additional resources added to the After Visit Summary for                                       Social Determinants of Health (SDOH) Interventions SDOH Screenings   Food Insecurity: No Food Insecurity (08/18/2023)  Housing: Low Risk  (08/18/2023)  Transportation Needs: No Transportation Needs (08/18/2023)  Utilities: Not At Risk (08/18/2023)  Depression (PHQ2-9): Low Risk  (08/08/2020)  Financial Resource Strain: Low Risk  (02/23/2023)   Received from [facility]  Social Connections: Unknown (06/11/2020)  Stress: No Stress Concern Present (06/11/2020)  Tobacco Use: High Risk (08/18/2023)     Readmission Risk Interventions     No data to display

## 2023-08-23 NOTE — Care Management Important Message (Signed)
Important Message  Patient Details  Name: Juan Watson MRN: [id number] Date of Birth: 1951-10-13   Medicare Important Message Given:  Yes     Johnell Comings 08/23/2023, 10:24 AM

## 2023-08-23 NOTE — Progress Notes (Signed)
Discharge instructions reviewed with patient including followup visits and new medications.  Understanding was verbalized and all questions were answered.  IV removed without complication; patient tolerated well.  Patient discharged home via wheelchair in stable condition escorted by nursing staff.

## 2023-08-23 NOTE — Discharge Summary (Signed)
[facility]    Discharge Summary    Patient ID:  Juan Watson MRN: [id number] DOB/AGE: 1951/09/06 72 y.o.  Admit date: 08/18/2023 Discharge date: 08/23/2023 Date of Surgery: 08/18/2023 Surgeon: Surgeon(s): Schnier, Latina Craver, MD Annice Needy, MD  Admission Diagnosis: Atherosclerosis of artery of extremity with rest pain [facility]) [I70.229]  Discharge Diagnoses:  Atherosclerosis of artery of extremity with rest pain [facility]) [I70.229]  Secondary Diagnoses: Past Medical History:  Diagnosis Date   Acquired flat foot 11/15/2015   Arthritis of knee, degenerative 04/15/2015   BPH (benign prostatic hyperplasia)    Chewing tobacco nicotine dependence without complication 08/10/2020   Cholecystitis 06/28/2017   Coronary artery disease    Diabetes mellitus without complication (HCC)    Diabetes mellitus, type 2 (HCC) 12/13/2002   Overview:  DIET CONTROLLED    Disease of nail 07/16/2011   Encounter for screening for malignant neoplasm of prostate 10/29/2014   Essential (primary) hypertension 10/17/2010   Familial aortic aneurysm 03/26/2014   Family history of cardiovascular disease 03/26/2014   Gastro-esophageal reflux disease without esophagitis 08/11/2013   Generalized OA 08/11/2013   Gout 03/26/2014   Heart murmur    HOH (hard of hearing)    aids   Motion sickness    "sea sick"   Peripheral vascular disease (HCC)    Primary osteoarthritis of right knee 04/14/2015   Pure hypercholesterolemia 12/13/2002   Skin lesion 03/11/2015    Procedure(s): BYPASS GRAFT FEMORAL-TIBIAL ARTERY (WITH VEIN) APPLICATION OF CELL SAVER ENDARTERECTOMY POPLITEAL ENDARTERECTOMY FEMORAL ENDARTERECTOMY TIBIOPERONEAL  Discharged Condition: good  HPI:  Juan Watson is a 71 yo male who is now POD #1 from right lower extremity Femoral to tibial artery bypass grafting.  Patient is recovering as expected.  Patient has Prevena wound VAC to right groin which will be removed by  the patient on Wednesday, 08/25/2023 after showering and dry dressing will be in place.  Patient was ordered home health nursing and physical therapy to follow-up at home for any difficulties.  All postoperative devices have been ordered.  Patient is ambulating well, eating well and urinating well.  Patient will be discharged home later today.  Patient will be discharged home on aspirin 81 mg daily, Plavix 75 mg daily, and Crestor 10 mg daily.  Hospital Course:  Juan Watson is a 72 y.o. male is S/P Right Extubated: POD # 0 Physical Exam:  Alert notes x3, no acute distress Face: Symmetrical.  Tongue is midline. Neck: Trachea is midline.  No swelling or bruising. Cardiovascular: Regular rate and rhythm Pulmonary: Clear to auscultation bilaterally Abdomen: Soft, nontender, nondistended Right groin access: Clean dry and intact.  No swelling or drainage noted Left lower extremity: Thigh soft.  Calf soft.  Extremities warm distally toes. Hard to palpate pedal pulses however the foot is warm is her good capillary refill. Right lower extremity: Thigh soft.  Calf soft.  Extremities warm distally toes. Hard to palpate pedal pulses however the foot is warm is her good capillary refill. Neurological: No deficits noted   Post-op wounds:  clean, dry, intact or healing well  Pt. Ambulating, voiding and taking PO diet without difficulty. Pt pain controlled with PO pain meds.  Labs:  As below  Complications: none  Consults:  Treatment Team:  Emeline General, MD  Significant Diagnostic Studies: CBC Lab Results  Component Value Date   WBC 14.3 (H) 08/21/2023   HGB 10.0 (L) 08/21/2023   HCT 29.8 (L) 08/21/2023  MCV 86.4 08/21/2023   PLT 220 08/21/2023    BMET    Component Value Date/Time   NA 133 (L) 08/21/2023 1007   K 3.5 08/21/2023 1007   CL 99 08/21/2023 1007   CO2 25 08/21/2023 1007   GLUCOSE 145 (H) 08/21/2023 1007   BUN 16 08/21/2023 1007   CREATININE 1.15 08/21/2023 1007    CALCIUM 8.3 (L) 08/21/2023 1007   GFRNONAA >60 08/21/2023 1007   GFRAA >60 02/13/2020 1809   COAG Lab Results  Component Value Date   INR 0.96 04/05/2015     Disposition:  Discharge to :Home  Allergies as of 08/23/2023       Reactions   Bee Venom Shortness Of Breath, Swelling   Sulfa Antibiotics Swelling   Tongue swelling   Ben Gay Ultra [menthol] Other (See Comments)   Whelts and redness of skin        Medication List     TAKE these medications    acetaminophen 500 MG tablet Commonly known as: TYLENOL Take 1,000 mg by mouth every 6 (six) hours as needed for mild pain or headache.   amLODipine 10 MG tablet Commonly known as: NORVASC Take 1 tablet (10 mg total) by mouth daily. What changed: how much to take   ascorbic acid 500 MG tablet Commonly known as: VITAMIN C Take 500 mg by mouth daily with supper.   aspirin EC 81 MG tablet Take 81 mg by mouth at bedtime.   chlorthalidone 25 MG tablet Commonly known as: HYGROTON Take 25 mg by mouth daily.   cholecalciferol 25 MCG (1000 UNIT) tablet Commonly known as: VITAMIN D3 Take 1,000 Units by mouth daily with supper.   cloNIDine 0.1 MG tablet Commonly known as: CATAPRES TAKE 1 TABLET TWICE DAILY   clopidogrel 75 MG tablet Commonly known as: PLAVIX Take 1 tablet (75 mg total) by mouth daily at 6 (six) AM. Start taking on: August 24, 2023   Co Q-10 100 MG Caps Take 200 mg by mouth daily with supper.   diclofenac 75 MG EC tablet Commonly known as: VOLTAREN Take 1 tablet (75 mg total) by mouth 2 (two) times daily as needed. What changed: when to take this   Febuxostat 80 MG Tabs Take 80 mg by mouth daily with supper.   Fish Oil 1200 MG Caps Take 1,200 mg by mouth 2 (two) times daily.   fluticasone 50 MCG/ACT nasal spray Commonly known as: FLONASE Place 1 spray into both nostrils in the morning and at bedtime.   glucose blood test strip Use as instructed   labetalol 200 MG tablet Commonly  known as: NORMODYNE TAKE 1 TABLET TWICE DAILY   levothyroxine 75 MCG tablet Commonly known as: SYNTHROID Take 75 mcg by mouth daily before breakfast.   magnesium oxide 400 MG tablet Commonly known as: MAG-OX Take 400 mg by mouth daily.   metFORMIN 500 MG tablet Commonly known as: GLUCOPHAGE Take 2 tablets (1,000 mg total) by mouth 2 (two) times daily with a meal.   Mounjaro 7.5 MG/0.5ML Pen Generic drug: tirzepatide Inject 7.5 mg into the skin once a week. Monday weekly   oxyCODONE-acetaminophen 5-325 MG tablet Commonly known as: PERCOCET/ROXICET Take 1-2 tablets by mouth every 4 (four) hours as needed for moderate pain.   pantoprazole 40 MG tablet Commonly known as: PROTONIX Take 40 mg by mouth daily.   pregabalin 50 MG capsule Commonly known as: LYRICA Take 50 mg by mouth at bedtime.   rosuvastatin 10 MG tablet  Commonly known as: Crestor Take 1 tablet (10 mg total) by mouth daily. What changed: when to take this   telmisartan 80 MG tablet Commonly known as: MICARDIS Take 80 mg by mouth daily with supper.       Verbal and written Discharge instructions given to the patient. Wound care per Discharge AVS  Follow-up Information     Schnier, Latina Craver, MD Follow up in 2 week(s).   Specialties: Vascular Surgery, Cardiology, Radiology, Vascular Surgery Why: Post Op Follow up for staple removal and U/S right lower extremity with ABI Contact information: [address] [place] [address] [phone]         Georgiana Spinner, NP .   Specialty: Vascular Surgery Contact information: [address] [place] [address] [phone]                 Signed: Marcie Bal, NP  08/23/2023, 10:09 AM

## 2023-08-23 NOTE — Plan of Care (Signed)
Problem: Clinical Measurements: Goal: Ability to maintain clinical measurements within normal limits will improve Outcome: Progressing   Problem: Activity: Goal: Risk for activity intolerance will decrease Outcome: Progressing

## 2023-08-23 NOTE — Discharge Instructions (Signed)
No heavy lifting until seen in clinic postoperatively for suture removal.  Do not lift anything more than a gallon of milk.  No driving until follow-up in clinic with staple removal.  You may shower on Wednesday morning.  Upon showering shower with the Prevena wound VAC dressing in place and remove immediately after getting out of the shower.  Right groin with staples to be covered with gauze dressing and tape.  Please keep groin staples clean dry and intact.  If you need to change the dressing more than once a day that is acceptable.  You are being discharged on aspirin 81 mg once daily, Plavix 75 mg once daily, and Crestor 10 mg once daily.  Please do not miss taking any of these medications they are vitally important to her bypass graft remaining patent.  Please follow-up with vein and vascular surgery as scheduled.

## 2023-08-26 ENCOUNTER — Telehealth (INDEPENDENT_AMBULATORY_CARE_PROVIDER_SITE_OTHER): Payer: Self-pay

## 2023-08-26 ENCOUNTER — Encounter: Payer: Self-pay | Admitting: Emergency Medicine

## 2023-08-26 ENCOUNTER — Inpatient Hospital Stay
Admission: EM | Admit: 2023-08-26 | Discharge: 2023-08-28 | DRG: 862 | Disposition: A | Discharge status: Home / Self Care | Payer: Medicare PPO | Attending: Obstetrics and Gynecology | Admitting: Obstetrics and Gynecology

## 2023-08-26 ENCOUNTER — Other Ambulatory Visit: Payer: Self-pay

## 2023-08-26 DIAGNOSIS — Z7902 Long term (current) use of antithrombotics/antiplatelets: Secondary | ICD-10-CM

## 2023-08-26 DIAGNOSIS — Z79890 Hormone replacement therapy (postmenopausal): Secondary | ICD-10-CM

## 2023-08-26 DIAGNOSIS — E7800 Pure hypercholesterolemia, unspecified: Secondary | ICD-10-CM | POA: Diagnosis present

## 2023-08-26 DIAGNOSIS — Z888 Allergy status to other drugs, medicaments and biological substances status: Secondary | ICD-10-CM

## 2023-08-26 DIAGNOSIS — Z9841 Cataract extraction status, right eye: Secondary | ICD-10-CM

## 2023-08-26 DIAGNOSIS — Z96651 Presence of right artificial knee joint: Secondary | ICD-10-CM | POA: Diagnosis present

## 2023-08-26 DIAGNOSIS — Z8249 Family history of ischemic heart disease and other diseases of the circulatory system: Secondary | ICD-10-CM

## 2023-08-26 DIAGNOSIS — Z961 Presence of intraocular lens: Secondary | ICD-10-CM | POA: Diagnosis present

## 2023-08-26 DIAGNOSIS — N400 Enlarged prostate without lower urinary tract symptoms: Secondary | ICD-10-CM | POA: Diagnosis present

## 2023-08-26 DIAGNOSIS — H9190 Unspecified hearing loss, unspecified ear: Secondary | ICD-10-CM | POA: Diagnosis present

## 2023-08-26 DIAGNOSIS — E782 Mixed hyperlipidemia: Secondary | ICD-10-CM | POA: Diagnosis present

## 2023-08-26 DIAGNOSIS — T8141XA Infection following a procedure, superficial incisional surgical site, initial encounter: Secondary | ICD-10-CM | POA: Diagnosis not present

## 2023-08-26 DIAGNOSIS — L03115 Cellulitis of right lower limb: Secondary | ICD-10-CM | POA: Diagnosis present

## 2023-08-26 DIAGNOSIS — T8140XA Infection following a procedure, unspecified, initial encounter: Principal | ICD-10-CM

## 2023-08-26 DIAGNOSIS — Z7985 Long-term (current) use of injectable non-insulin antidiabetic drugs: Secondary | ICD-10-CM

## 2023-08-26 DIAGNOSIS — E119 Type 2 diabetes mellitus without complications: Secondary | ICD-10-CM

## 2023-08-26 DIAGNOSIS — T8149XA Infection following a procedure, other surgical site, initial encounter: Secondary | ICD-10-CM | POA: Diagnosis present

## 2023-08-26 DIAGNOSIS — E1151 Type 2 diabetes mellitus with diabetic peripheral angiopathy without gangrene: Secondary | ICD-10-CM | POA: Diagnosis present

## 2023-08-26 DIAGNOSIS — I10 Essential (primary) hypertension: Secondary | ICD-10-CM | POA: Diagnosis present

## 2023-08-26 DIAGNOSIS — Z7984 Long term (current) use of oral hypoglycemic drugs: Secondary | ICD-10-CM

## 2023-08-26 DIAGNOSIS — Z9049 Acquired absence of other specified parts of digestive tract: Secondary | ICD-10-CM

## 2023-08-26 DIAGNOSIS — A419 Sepsis, unspecified organism: Secondary | ICD-10-CM | POA: Diagnosis present

## 2023-08-26 DIAGNOSIS — E039 Hypothyroidism, unspecified: Secondary | ICD-10-CM | POA: Diagnosis present

## 2023-08-26 DIAGNOSIS — G8929 Other chronic pain: Secondary | ICD-10-CM | POA: Diagnosis present

## 2023-08-26 DIAGNOSIS — L03314 Cellulitis of groin: Secondary | ICD-10-CM | POA: Diagnosis present

## 2023-08-26 DIAGNOSIS — Z79899 Other long term (current) drug therapy: Secondary | ICD-10-CM

## 2023-08-26 DIAGNOSIS — Z955 Presence of coronary angioplasty implant and graft: Secondary | ICD-10-CM

## 2023-08-26 DIAGNOSIS — Z9842 Cataract extraction status, left eye: Secondary | ICD-10-CM

## 2023-08-26 DIAGNOSIS — Z7982 Long term (current) use of aspirin: Secondary | ICD-10-CM

## 2023-08-26 DIAGNOSIS — Z95828 Presence of other vascular implants and grafts: Secondary | ICD-10-CM

## 2023-08-26 DIAGNOSIS — Z882 Allergy status to sulfonamides status: Secondary | ICD-10-CM

## 2023-08-26 DIAGNOSIS — Z807 Family history of other malignant neoplasms of lymphoid, hematopoietic and related tissues: Secondary | ICD-10-CM

## 2023-08-26 DIAGNOSIS — F17220 Nicotine dependence, chewing tobacco, uncomplicated: Secondary | ICD-10-CM | POA: Diagnosis present

## 2023-08-26 DIAGNOSIS — I2510 Atherosclerotic heart disease of native coronary artery without angina pectoris: Secondary | ICD-10-CM | POA: Diagnosis present

## 2023-08-26 DIAGNOSIS — I739 Peripheral vascular disease, unspecified: Secondary | ICD-10-CM | POA: Diagnosis present

## 2023-08-26 DIAGNOSIS — M109 Gout, unspecified: Secondary | ICD-10-CM | POA: Diagnosis present

## 2023-08-26 LAB — COMPREHENSIVE METABOLIC PANEL
ALT: 39 U/L (ref 0–44)
AST: 28 U/L (ref 15–41)
Albumin: 3.8 g/dL (ref 3.5–5.0)
Alkaline Phosphatase: 44 U/L (ref 38–126)
Anion gap: 14 (ref 5–15)
BUN: 16 mg/dL (ref 8–23)
CO2: 24 mmol/L (ref 22–32)
Calcium: 8.9 mg/dL (ref 8.9–10.3)
Chloride: 96 mmol/L — ABNORMAL LOW (ref 98–111)
Creatinine, Ser: 1.11 mg/dL (ref 0.61–1.24)
GFR, Estimated: >60 mL/min (ref >60)
Glucose, Bld: 124 mg/dL — ABNORMAL HIGH (ref 70–99)
Potassium: 3.5 mmol/L (ref 3.5–5.1)
Sodium: 134 mmol/L — ABNORMAL LOW (ref 135–145)
Total Bilirubin: 0.7 mg/dL (ref 0.3–1.2)
Total Protein: 6.7 g/dL (ref 6.5–8.1)

## 2023-08-26 LAB — CBC WITH DIFFERENTIAL/PLATELET
Abs Immature Granulocytes: 0.23 10*3/uL — ABNORMAL HIGH (ref 0.00–0.07)
Basophils Absolute: 0.1 10*3/uL (ref 0.0–0.1)
Basophils Relative: 1 %
Eosinophils Absolute: 0.8 10*3/uL — ABNORMAL HIGH (ref 0.0–0.5)
Eosinophils Relative: 6 %
HCT: 32.7 % — ABNORMAL LOW (ref 39.0–52.0)
Hemoglobin: 10.7 g/dL — ABNORMAL LOW (ref 13.0–17.0)
Immature Granulocytes: 2 %
Lymphocytes Relative: 18 %
Lymphs Abs: 2.5 10*3/uL (ref 0.7–4.0)
MCH: 28.2 pg (ref 26.0–34.0)
MCHC: 32.7 g/dL (ref 30.0–36.0)
MCV: 86.1 fL (ref 80.0–100.0)
Monocytes Absolute: 1.3 10*3/uL — ABNORMAL HIGH (ref 0.1–1.0)
Monocytes Relative: 10 %
Neutro Abs: 8.6 10*3/uL — ABNORMAL HIGH (ref 1.7–7.7)
Neutrophils Relative %: 63 %
Platelets: 423 10*3/uL — ABNORMAL HIGH (ref 150–400)
RBC: 3.8 MIL/uL — ABNORMAL LOW (ref 4.22–5.81)
RDW: 13.2 % (ref 11.5–15.5)
WBC: 13.6 10*3/uL — ABNORMAL HIGH (ref 4.0–10.5)
nRBC: 0 % (ref 0.0–0.2)

## 2023-08-26 LAB — LACTIC ACID, PLASMA: Lactic Acid, Venous: 1.7 mmol/L (ref 0.5–1.9)

## 2023-08-26 NOTE — Telephone Encounter (Signed)
Yes that is fine

## 2023-08-26 NOTE — ED Provider Notes (Signed)
[facility] Provider Note    Event Date/Time   First MD Initiated Contact with Patient 08/26/23 2347     (approximate)   History   Post-op Problem   HPI  Juan Watson is a 72 y.o. male who presents to the ED from home with a chief complaint of postoperative infection.  Patient had femoral-tibial bypass surgery on 08/18/2023 by Dr. Angelica Pou.  Has been recovering fine, states today was his best day.  Home health nurse noted sliding on his femoral site yesterday and applied Medihoney.  Today daughter noted increased redness with streaking.  Unsure if patient has been running fever because he has been taking scheduled Tylenol to control his pain.  Denies chills, chest pain, shortness of breath, abdominal pain, nausea, vomiting or dizziness.     Past Medical History   Past Medical History:  Diagnosis Date   Acquired flat foot 11/15/2015   Arthritis of knee, degenerative 04/15/2015   BPH (benign prostatic hyperplasia)    Chewing tobacco nicotine dependence without complication 08/10/2020   Cholecystitis 06/28/2017   Coronary artery disease    Diabetes mellitus without complication (HCC)    Diabetes mellitus, type 2 (HCC) 12/13/2002   Overview:  DIET CONTROLLED    Disease of nail 07/16/2011   Encounter for screening for malignant neoplasm of prostate 10/29/2014   Essential (primary) hypertension 10/17/2010   Familial aortic aneurysm 03/26/2014   Family history of cardiovascular disease 03/26/2014   Gastro-esophageal reflux disease without esophagitis 08/11/2013   Generalized OA 08/11/2013   Gout 03/26/2014   Heart murmur    HOH (hard of hearing)    aids   Motion sickness    "sea sick"   Peripheral vascular disease (HCC)    Primary osteoarthritis of right knee 04/14/2015   Pure hypercholesterolemia 12/13/2002   Skin lesion 03/11/2015     Active Problem List   Patient Active Problem List   Diagnosis Date Noted   Surgical site infection  08/27/2023   PAD s/p femoral-peronealeal bypass surgery 08/18/23 08/27/2023   Atherosclerosis of artery of extremity with rest pain (HCC) 08/18/2023   Atherosclerosis of native arteries of the extremities with ulceration (HCC) 07/25/2023   Atherosclerosis of native arteries of extremity with rest pain (HCC) 06/21/2023   PAD (peripheral artery disease) (HCC) 02/07/2021   Diabetes (HCC) 10/16/2020   Tobacco use disorder 10/16/2020   Mixed hyperlipidemia 10/16/2020   Elevated TSH 08/11/2020   Atherosclerosis of native arteries of extremity with intermittent claudication (HCC) 07/29/2020   Coronary artery disease involving native coronary artery of native heart 04/25/2020   S/P drug eluting coronary stent placement 04/11/2020   Angina pectoris (HCC) 04/05/2020   FH: colon cancer in relative diagnosed at >64 years old 06/30/2019   Nocturnal muscle cramps 04/05/2018   Status post arthroscopy 12/28/2017   Arthrofibrosis of total knee arthroplasty, initial encounter (HCC) 12/16/2017   Adhesive capsulitis of knee, right 11/01/2017   Cholecystitis 06/28/2017   Aortic valve sclerosis 11/04/2016   Grief reaction 07/31/2016   Acquired right flat foot 11/15/2015   Arthritis of knee, degenerative 04/15/2015   Primary osteoarthritis of right knee 04/14/2015   Skin lesion 03/11/2015   Burning or prickling sensation 10/29/2014   Encounter for screening for malignant neoplasm of prostate 10/29/2014   Familial aortic aneurysm 03/26/2014   Gout 03/26/2014   Family history of cardiovascular disease 03/26/2014   Gastro-esophageal reflux disease without esophagitis 08/11/2013   Generalized OA 08/11/2013   Disease of nail 07/16/2011  Paronychia of toe 07/16/2011   Benign essential HTN 10/17/2010   Diabetes mellitus, type 2 (HCC) 12/13/2002     Past Surgical History   Past Surgical History:  Procedure Laterality Date   BACK SURGERY  [age]   CARDIAC CATHETERIZATION  70's   pericarditis   CATARACT  EXTRACTION W/PHACO Right 03/23/2016   Procedure: CATARACT EXTRACTION PHACO AND INTRAOCULAR LENS PLACEMENT (IOC);  Surgeon: Sallee Lange, MD;  Location: [facility];  Service: Ophthalmology;  Laterality: Right;  [place] 01:31    CATARACT EXTRACTION W/PHACO Left 10/23/2019   Procedure: CATARACT EXTRACTION PHACO AND INTRAOCULAR LENS PLACEMENT (IOC) LEFT DIABETIC TORIC LENS 5.82,   00:44.1;  Surgeon: Nevada Crane, MD;  Location: [facility];  Service: Ophthalmology;  Laterality: Left;  Diabetic - oral meds   CHOLECYSTECTOMY N/A 06/29/2017   Procedure: LAPAROSCOPIC CHOLECYSTECTOMY;  Surgeon: Henrene Dodge, MD;  Location: [facility];  Service: General;  Laterality: N/A;   COLONOSCOPY WITH PROPOFOL N/A 10/06/2017   Procedure: COLONOSCOPY WITH PROPOFOL;  Surgeon: Scot Jun, MD;  Location: [facility];  Service: Endoscopy;  Laterality: N/A;   CORONARY STENT INTERVENTION N/A 04/11/2020   Procedure: CORONARY STENT INTERVENTION;  Surgeon: Alwyn Pea, MD;  Location: [facility];  Service: Cardiovascular;  Laterality: N/A;   ENDARTERECTOMY FEMORAL Right 08/18/2023   Procedure: ENDARTERECTOMY FEMORAL;  Surgeon: Renford Dills, MD;  Location: [facility];  Service: Vascular;  Laterality: Right;   ENDARTERECTOMY POPLITEAL Right 08/18/2023   Procedure: ENDARTERECTOMY POPLITEAL;  Surgeon: Renford Dills, MD;  Location: [facility];  Service: Vascular;  Laterality: Right;   ENDARTERECTOMY TIBIOPERONEAL Right 08/18/2023   Procedure: ENDARTERECTOMY TIBIOPERONEAL;  Surgeon: Renford Dills, MD;  Location: [facility];  Service: Vascular;  Laterality: Right;   FEMORAL-TIBIAL BYPASS GRAFT Right 08/18/2023   Procedure: BYPASS GRAFT FEMORAL-TIBIAL ARTERY (WITH VEIN);  Surgeon: Renford Dills, MD;  Location: [facility];  Service: Vascular;  Laterality: Right;   HERNIA REPAIR  11,   umbilical x2   KNEE ARTHROSCOPY Right 12/20/2017   Procedure: ARTHROSCOPY KNEE EXTENSIVE SYNOVECTOMY AND LYSIS  OF ADHESIONS;  Surgeon: Donato Heinz, MD;  Location: [facility];  Service: Orthopedics;  Laterality: Right;   KNEE DEBRIDEMENT Left    tendon   LEFT HEART CATH AND CORONARY ANGIOGRAPHY Left 04/11/2020   Procedure: LEFT HEART CATH AND CORONARY ANGIOGRAPHY;  Surgeon: Lamar Blinks, MD;  Location: [facility];  Service: Cardiovascular;  Laterality: Left;   LOWER EXTREMITY ANGIOGRAPHY Left 09/24/2020   Procedure: LOWER EXTREMITY ANGIOGRAPHY;  Surgeon: Renford Dills, MD;  Location: [facility];  Service: Cardiovascular;  Laterality: Left;   LOWER EXTREMITY ANGIOGRAPHY Right 07/13/2023   Procedure: Lower Extremity Angiography;  Surgeon: Renford Dills, MD;  Location: [facility];  Service: Cardiovascular;  Laterality: Right;   TONSILLECTOMY  72   TOTAL KNEE ARTHROPLASTY Right 04/15/2015   Procedure: TOTAL KNEE ARTHROPLASTY;  Surgeon: Gean Birchwood, MD;  Location: [facility];  Service: Orthopedics;  Laterality: Right;   TRANSURETHRAL RESECTION OF PROSTATE  ?     Home Medications   Prior to Admission medications   Medication Sig Start Date End Date Taking? Authorizing Provider  acetaminophen (TYLENOL) 500 MG tablet Take 1,000 mg by mouth every 6 (six) hours as needed for mild pain or headache.   Yes [provider]  ascorbic acid (VITAMIN C) 500 MG tablet Take 500 mg by mouth daily with supper.   Yes [provider]  aspirin EC 81 MG tablet Take 81 mg by mouth at bedtime.   Yes [provider]  cholecalciferol (VITAMIN D3) 25 MCG (1000 UNIT) tablet Take 1,000 Units by mouth daily with supper.   Yes [provider]  clopidogrel (PLAVIX) 75 MG tablet Take 1 tablet (75 mg total) by mouth daily at 6 (six) AM. 08/24/23  Yes Pace, Brien R, NP  Coenzyme Q10 (CO Q-10) 100 MG CAPS Take 200 mg by mouth daily with supper.   Yes [provider]  Febuxostat 80 MG TABS Take 80 mg by mouth daily with supper.   Yes [provider]   fluticasone (FLONASE) 50 MCG/ACT nasal spray Place 1 spray into both nostrils in the morning and at bedtime.   Yes [provider]  glucose blood test strip Use as instructed 04/30/20  Yes Theadore Nan, NP  levothyroxine (SYNTHROID) 75 MCG tablet Take 75 mcg by mouth daily before breakfast.   Yes [provider]  magnesium oxide (MAG-OX) 400 MG tablet Take 400 mg by mouth daily.   Yes [provider]  metFORMIN (GLUCOPHAGE) 500 MG tablet Take 2 tablets (1,000 mg total) by mouth 2 (two) times daily with a meal. Patient taking differently: Take 500 mg by mouth 2 (two) times daily with a meal. 10/04/20  Yes Theadore Nan, NP  MOUNJARO 7.5 MG/0.5ML Pen Inject 7.5 mg into the skin every Monday.   Yes [provider]  Omega-3 Fatty Acids (FISH OIL) 1200 MG CAPS Take 1,200 mg by mouth 2 (two) times daily.   Yes [provider]  oxyCODONE-acetaminophen (PERCOCET/ROXICET) 5-325 MG tablet Take 1-2 tablets by mouth every 4 (four) hours as needed for moderate pain. 08/23/23  Yes Pace, Brien R, NP  pantoprazole (PROTONIX) 40 MG tablet Take 40 mg by mouth daily.   Yes [provider]  pregabalin (LYRICA) 50 MG capsule Take 50 mg by mouth daily.   Yes [provider]  rosuvastatin (CRESTOR) 10 MG tablet Take 1 tablet (10 mg total) by mouth daily. Patient taking differently: Take 10 mg by mouth daily with supper. 11/08/20  Yes Glori Luis, MD  amLODipine (NORVASC) 10 MG tablet Take 1 tablet (10 mg total) by mouth daily. Patient not taking: Reported on 08/27/2023 12/17/20   Dale , MD  chlorthalidone (HYGROTON) 25 MG tablet Take 25 mg by mouth daily. Patient not taking: Reported on 08/27/2023    [provider]  cloNIDine (CATAPRES) 0.1 MG tablet TAKE 1 TABLET TWICE DAILY Patient not taking: Reported on 08/27/2023 08/28/20   Theadore Nan, NP  diclofenac (VOLTAREN) 75 MG EC tablet Take 1 tablet (75 mg total) by mouth 2  (two) times daily as needed. Patient not taking: Reported on 08/27/2023 02/03/21   Allegra Grana, FNP  labetalol (NORMODYNE) 200 MG tablet TAKE 1 TABLET TWICE DAILY Patient not taking: Reported on 08/27/2023 05/20/21   Allegra Grana, FNP  telmisartan (MICARDIS) 80 MG tablet Take 80 mg by mouth daily with supper.  Patient not taking: Reported on 08/27/2023    [provider]     Allergies  Bee venom, Sulfa antibiotics, and Ben gay ultra [menthol]   Family History   Family History  Problem Relation Age of Onset   Multiple myeloma Mother    Aneurysm Father    Aneurysm Brother    Nephrolithiasis Neg Hx    Bladder Cancer Neg Hx    Prostate cancer Neg Hx    Kidney cancer  Neg Hx      Physical Exam  Triage Vital Signs: ED Triage Vitals  Encounter Vitals Group     BP 08/26/23 1926 129/79     Systolic BP Percentile --      Diastolic BP Percentile --      Pulse Rate 08/26/23 1926 98     Resp 08/26/23 1926 18     Temp 08/26/23 1926 98.2 F (36.8 C)     Temp Source 08/26/23 1926 Oral     SpO2 08/26/23 1926 98 %     Weight 08/26/23 1930 227 lb 8.2 oz (103.2 kg)     Height 08/26/23 1930 5\' 7"  (1.702 m)     Head Circumference --      Peak Flow --      Pain Score 08/26/23 1928 7     Pain Loc --      Pain Education --      Exclude from Growth Chart --     Updated Vital Signs: BP (!) 142/60   Pulse 81   Temp 97.7 F (36.5 C) (Oral)   Resp 18   Ht 5\' 7"  (1.702 m)   Wt 103.2 kg   SpO2 100%   BMI 35.63 kg/m    General: Awake, no distress.  CV:  RRR.  Good peripheral perfusion.  Resp:  Normal effort.  CTAB. Abd:  Nontender.  No distention.  Other:  RLE: Femoral OpSite warm and erythematous with expressed purulence.  Lower leg sites clean/dry/intact.  Palpable femoral and distal pulses.   ED Results / Procedures / Treatments  Labs (all labs ordered are listed, but only abnormal results are displayed) Labs Reviewed  CBC WITH DIFFERENTIAL/PLATELET -  Abnormal; Notable for the following components:      Result Value   WBC 13.6 (*)    RBC 3.80 (*)    Hemoglobin 10.7 (*)    HCT 32.7 (*)    Platelets 423 (*)    Neutro Abs 8.6 (*)    Monocytes Absolute 1.3 (*)    Eosinophils Absolute 0.8 (*)    Abs Immature Granulocytes 0.23 (*)    All other components within normal limits  COMPREHENSIVE METABOLIC PANEL - Abnormal; Notable for the following components:   Sodium 134 (*)    Chloride 96 (*)    Glucose, Bld 124 (*)    All other components within normal limits  CULTURE, BLOOD (SINGLE)  CULTURE, BLOOD (SINGLE)  LACTIC ACID, PLASMA     EKG  None   RADIOLOGY None   Official radiology report(s): No results found.   PROCEDURES:  Critical Care performed: No  .1-3 Lead EKG Interpretation  Performed by: Irean Hong, MD Authorized by: Irean Hong, MD     Interpretation: normal     ECG rate:  85   ECG rate assessment: normal     Rhythm: sinus rhythm     Ectopy: none     Conduction: normal   Comments:     Patient placed on cardiac monitor to evaluate for arrhythmias    MEDICATIONS ORDERED IN ED: Medications  vancomycin (VANCOCIN) IVPB 1000 mg/200 mL premix (has no administration in time range)    Followed by  vancomycin (VANCOREADY) IVPB 1500 mg/300 mL (has no administration in time range)  piperacillin-tazobactam (ZOSYN) IVPB 3.375 g (3.375 g Intravenous New Bag/Given 08/27/23 0030)  sodium chloride 0.9 % bolus 1,000 mL (1,000 mLs Intravenous New Bag/Given 08/27/23 0031)     IMPRESSION / MDM / ASSESSMENT  AND PLAN / ED COURSE  I reviewed the triage vital signs and the nursing notes.                             71 year old male presenting with postoperative warmth, erythema and expressed purulence.  Differential diagnosis includes but is not limited to postoperative wound infection, sepsis, SIRS, etc.  I personally reviewed patient's records and note his op note from 08/18/2023.  Patient's presentation is most  consistent with acute presentation with potential threat to life or bodily function.  The patient is on the cardiac monitor to evaluate for evidence of arrhythmia and/or significant heart rate changes.  Laboratory results demonstrate moderate leukocytosis with WBC 13.6, unremarkable electrolytes.  Lactic acid less than 2.  Will discuss with vascular surgery.  Clinical Course as of 08/27/23 0108  Fri Aug 27, 2023  0013 Discussed case with vascular surgeon on-call Dr. Wyn Quaker who agrees with starting IV antibiotics and recommends hospitalization.  Does not recommend imaging study.  Will consult hospitalist services for evaluation and admission. [JS]    Clinical Course User Index [JS] Irean Hong, MD     FINAL CLINICAL IMPRESSION(S) / ED DIAGNOSES   Final diagnoses:  Postoperative infection, unspecified type, initial encounter     Rx / DC Orders   ED Discharge Orders     None        Note:  This document was prepared using Dragon voice recognition software and may include unintentional dictation errors.   Irean Hong, MD 08/27/23 [phone]

## 2023-08-26 NOTE — Telephone Encounter (Signed)
Trey Paula called for verbal orders stating that the patients incision on the right side has sloth and he wanted to know if he could treat it with medihoney.   Please advise

## 2023-08-26 NOTE — ED Triage Notes (Signed)
Pt presents ambulatory to triage via POV with complaints of post op incision drainage, redness and intermittent fevers. Pt states that he had a bypass graft of his femoral tibial artery on 08/18/23. Pt has a home health RN who comes and changes his dressings. A&Ox4 at this time. Denies CP or SOB.

## 2023-08-27 DIAGNOSIS — A419 Sepsis, unspecified organism: Secondary | ICD-10-CM | POA: Diagnosis present

## 2023-08-27 DIAGNOSIS — L03314 Cellulitis of groin: Secondary | ICD-10-CM | POA: Diagnosis present

## 2023-08-27 DIAGNOSIS — Z96651 Presence of right artificial knee joint: Secondary | ICD-10-CM | POA: Diagnosis present

## 2023-08-27 DIAGNOSIS — Z95828 Presence of other vascular implants and grafts: Secondary | ICD-10-CM

## 2023-08-27 DIAGNOSIS — I2510 Atherosclerotic heart disease of native coronary artery without angina pectoris: Secondary | ICD-10-CM | POA: Diagnosis present

## 2023-08-27 DIAGNOSIS — H9190 Unspecified hearing loss, unspecified ear: Secondary | ICD-10-CM | POA: Diagnosis present

## 2023-08-27 DIAGNOSIS — N400 Enlarged prostate without lower urinary tract symptoms: Secondary | ICD-10-CM | POA: Diagnosis present

## 2023-08-27 DIAGNOSIS — M109 Gout, unspecified: Secondary | ICD-10-CM | POA: Diagnosis present

## 2023-08-27 DIAGNOSIS — E039 Hypothyroidism, unspecified: Secondary | ICD-10-CM | POA: Diagnosis present

## 2023-08-27 DIAGNOSIS — Z8249 Family history of ischemic heart disease and other diseases of the circulatory system: Secondary | ICD-10-CM | POA: Diagnosis not present

## 2023-08-27 DIAGNOSIS — E7800 Pure hypercholesterolemia, unspecified: Secondary | ICD-10-CM | POA: Diagnosis present

## 2023-08-27 DIAGNOSIS — T8149XA Infection following a procedure, other surgical site, initial encounter: Secondary | ICD-10-CM | POA: Diagnosis present

## 2023-08-27 DIAGNOSIS — Z79890 Hormone replacement therapy (postmenopausal): Secondary | ICD-10-CM | POA: Diagnosis not present

## 2023-08-27 DIAGNOSIS — Z888 Allergy status to other drugs, medicaments and biological substances status: Secondary | ICD-10-CM | POA: Diagnosis not present

## 2023-08-27 DIAGNOSIS — Z9842 Cataract extraction status, left eye: Secondary | ICD-10-CM | POA: Diagnosis not present

## 2023-08-27 DIAGNOSIS — Z79899 Other long term (current) drug therapy: Secondary | ICD-10-CM | POA: Diagnosis not present

## 2023-08-27 DIAGNOSIS — Z961 Presence of intraocular lens: Secondary | ICD-10-CM | POA: Diagnosis present

## 2023-08-27 DIAGNOSIS — G8929 Other chronic pain: Secondary | ICD-10-CM | POA: Diagnosis present

## 2023-08-27 DIAGNOSIS — T8140XA Infection following a procedure, unspecified, initial encounter: Secondary | ICD-10-CM

## 2023-08-27 DIAGNOSIS — T8141XA Infection following a procedure, superficial incisional surgical site, initial encounter: Secondary | ICD-10-CM | POA: Diagnosis present

## 2023-08-27 DIAGNOSIS — E1151 Type 2 diabetes mellitus with diabetic peripheral angiopathy without gangrene: Secondary | ICD-10-CM | POA: Diagnosis present

## 2023-08-27 DIAGNOSIS — L03115 Cellulitis of right lower limb: Secondary | ICD-10-CM | POA: Diagnosis present

## 2023-08-27 DIAGNOSIS — Z7984 Long term (current) use of oral hypoglycemic drugs: Secondary | ICD-10-CM | POA: Diagnosis not present

## 2023-08-27 DIAGNOSIS — Z955 Presence of coronary angioplasty implant and graft: Secondary | ICD-10-CM | POA: Diagnosis not present

## 2023-08-27 DIAGNOSIS — I10 Essential (primary) hypertension: Secondary | ICD-10-CM | POA: Diagnosis present

## 2023-08-27 DIAGNOSIS — Z882 Allergy status to sulfonamides status: Secondary | ICD-10-CM | POA: Diagnosis not present

## 2023-08-27 DIAGNOSIS — Z9049 Acquired absence of other specified parts of digestive tract: Secondary | ICD-10-CM | POA: Diagnosis not present

## 2023-08-27 LAB — CBG MONITORING, ED
Glucose-Capillary: 137 mg/dL — ABNORMAL HIGH (ref 70–99)
Glucose-Capillary: 122 mg/dL — ABNORMAL HIGH (ref 70–99)
Glucose-Capillary: 127 mg/dL — ABNORMAL HIGH (ref 70–99)
Glucose-Capillary: 147 mg/dL — ABNORMAL HIGH (ref 70–99)

## 2023-08-27 LAB — GLUCOSE, CAPILLARY
Glucose-Capillary: 132 mg/dL — ABNORMAL HIGH (ref 70–99)
Glucose-Capillary: 146 mg/dL — ABNORMAL HIGH (ref 70–99)
Glucose-Capillary: 166 mg/dL — ABNORMAL HIGH (ref 70–99)

## 2023-08-27 LAB — MRSA NEXT GEN BY PCR, NASAL: MRSA by PCR Next Gen: NOT DETECTED

## 2023-08-27 MED ORDER — VANCOMYCIN HCL IN DEXTROSE 1-5 GM/200ML-% IV SOLN
1000.0000 mg | Freq: Once | INTRAVENOUS | Status: AC
  Administered 2023-08-27: 1000 mg via INTRAVENOUS
  Filled 2023-08-27: qty 200

## 2023-08-27 MED ORDER — IRBESARTAN 150 MG PO TABS
150.0000 mg | ORAL_TABLET | Freq: Every day | ORAL | Status: DC
  Administered 2023-08-27 – 2023-08-28 (×2): 150 mg via ORAL
  Filled 2023-08-27 (×2): qty 1

## 2023-08-27 MED ORDER — INSULIN ASPART 100 UNIT/ML IJ SOLN
0.0000 [IU] | Freq: Three times a day (TID) | INTRAMUSCULAR | Status: DC
  Administered 2023-08-27 – 2023-08-28 (×4): 3 [IU] via SUBCUTANEOUS
  Filled 2023-08-27 (×4): qty 1

## 2023-08-27 MED ORDER — VANCOMYCIN HCL 1250 MG/250ML IV SOLN
1250.0000 mg | INTRAVENOUS | Status: DC
  Administered 2023-08-28: 1250 mg via INTRAVENOUS
  Filled 2023-08-27: qty 250

## 2023-08-27 MED ORDER — PIPERACILLIN-TAZOBACTAM 3.375 G IVPB 30 MIN
3.3750 g | Freq: Once | INTRAVENOUS | Status: AC
  Administered 2023-08-27: 3.375 g via INTRAVENOUS
  Filled 2023-08-27: qty 50

## 2023-08-27 MED ORDER — SODIUM CHLORIDE 0.9 % IV BOLUS
1000.0000 mL | Freq: Once | INTRAVENOUS | Status: AC
  Administered 2023-08-27: 1000 mL via INTRAVENOUS

## 2023-08-27 MED ORDER — MORPHINE SULFATE (PF) 2 MG/ML IV SOLN: 2.0000 mg | INTRAVENOUS | Status: DC | PRN

## 2023-08-27 MED ORDER — VANCOMYCIN HCL 1500 MG/300ML IV SOLN
1500.0000 mg | Freq: Once | INTRAVENOUS | Status: AC
  Administered 2023-08-27: 1500 mg via INTRAVENOUS
  Filled 2023-08-27: qty 300

## 2023-08-27 MED ORDER — SODIUM CHLORIDE 0.9 % IV SOLN
2.0000 g | INTRAVENOUS | Status: DC
  Administered 2023-08-27 – 2023-08-28 (×2): 2 g via INTRAVENOUS
  Filled 2023-08-27 (×2): qty 20

## 2023-08-27 MED ORDER — INSULIN ASPART 100 UNIT/ML IJ SOLN: 0.0000 [IU] | Freq: Every day | INTRAMUSCULAR | Status: DC

## 2023-08-27 MED ORDER — ONDANSETRON HCL 4 MG PO TABS: 4.0000 mg | ORAL_TABLET | Freq: Four times a day (QID) | ORAL | Status: DC | PRN

## 2023-08-27 MED ORDER — LACTATED RINGERS IV BOLUS (SEPSIS): 1000.0000 mL | Freq: Once | INTRAVENOUS | Status: DC

## 2023-08-27 MED ORDER — ROSUVASTATIN CALCIUM 10 MG PO TABS
10.0000 mg | ORAL_TABLET | Freq: Every day | ORAL | Status: DC
  Administered 2023-08-27: 10 mg via ORAL
  Filled 2023-08-27 (×2): qty 1

## 2023-08-27 MED ORDER — OXYCODONE-ACETAMINOPHEN 5-325 MG PO TABS
1.0000 | ORAL_TABLET | ORAL | Status: DC | PRN
  Filled 2023-08-27: qty 2

## 2023-08-27 MED ORDER — ASPIRIN 81 MG PO TBEC
81.0000 mg | DELAYED_RELEASE_TABLET | Freq: Every day | ORAL | Status: DC
  Administered 2023-08-27: 81 mg via ORAL
  Filled 2023-08-27: qty 1

## 2023-08-27 MED ORDER — CLOPIDOGREL BISULFATE 75 MG PO TABS
75.0000 mg | ORAL_TABLET | Freq: Every day | ORAL | Status: DC
  Administered 2023-08-27 – 2023-08-28 (×2): 75 mg via ORAL
  Filled 2023-08-27 (×2): qty 1

## 2023-08-27 MED ORDER — ONDANSETRON HCL 4 MG/2ML IJ SOLN: 4.0000 mg | Freq: Four times a day (QID) | INTRAMUSCULAR | Status: DC | PRN

## 2023-08-27 MED ORDER — ENOXAPARIN SODIUM 60 MG/0.6ML IJ SOSY: 0.5000 mg/kg | PREFILLED_SYRINGE | INTRAMUSCULAR | Status: DC

## 2023-08-27 MED ORDER — LEVOTHYROXINE SODIUM 50 MCG PO TABS
75.0000 ug | ORAL_TABLET | Freq: Every day | ORAL | Status: DC
  Administered 2023-08-27 – 2023-08-28 (×2): 75 ug via ORAL
  Filled 2023-08-27: qty 2
  Filled 2023-08-27: qty 1

## 2023-08-27 MED ORDER — ACETAMINOPHEN 650 MG RE SUPP: 650.0000 mg | Freq: Four times a day (QID) | RECTAL | Status: DC | PRN

## 2023-08-27 MED ORDER — ACETAMINOPHEN 325 MG PO TABS
650.0000 mg | ORAL_TABLET | Freq: Four times a day (QID) | ORAL | Status: DC | PRN
  Administered 2023-08-27: 650 mg via ORAL
  Filled 2023-08-27: qty 2

## 2023-08-27 MED ORDER — FEBUXOSTAT 40 MG PO TABS
80.0000 mg | ORAL_TABLET | Freq: Every day | ORAL | Status: DC
  Administered 2023-08-27: 80 mg via ORAL
  Filled 2023-08-27 (×3): qty 2

## 2023-08-27 MED ORDER — PREGABALIN 50 MG PO CAPS
50.0000 mg | ORAL_CAPSULE | Freq: Every day | ORAL | Status: DC
  Administered 2023-08-27 – 2023-08-28 (×2): 50 mg via ORAL
  Filled 2023-08-27 (×2): qty 1

## 2023-08-27 MED ORDER — ENOXAPARIN SODIUM 60 MG/0.6ML IJ SOSY
0.5000 mg/kg | PREFILLED_SYRINGE | INTRAMUSCULAR | Status: DC
  Administered 2023-08-27: 52.5 mg via SUBCUTANEOUS
  Filled 2023-08-27: qty 0.6

## 2023-08-27 MED ORDER — CALCIUM CARBONATE ANTACID 500 MG PO CHEW
800.0000 mg | CHEWABLE_TABLET | Freq: Three times a day (TID) | ORAL | Status: DC | PRN
  Administered 2023-08-27: 800 mg via ORAL
  Filled 2023-08-27: qty 4

## 2023-08-27 MED ORDER — PANTOPRAZOLE SODIUM 40 MG PO TBEC
40.0000 mg | DELAYED_RELEASE_TABLET | Freq: Every day | ORAL | Status: DC
  Administered 2023-08-27 – 2023-08-28 (×2): 40 mg via ORAL
  Filled 2023-08-27 (×2): qty 1

## 2023-08-27 NOTE — Assessment & Plan Note (Signed)
Sliding scale insulin coverage 

## 2023-08-27 NOTE — Consult Note (Signed)
PHARMACIST - PHYSICIAN COMMUNICATION  CONCERNING:  Enoxaparin (Lovenox) for DVT Prophylaxis    RECOMMENDATION: Patient was prescribed enoxaprin 40mg  q24 hours for VTE prophylaxis.   Filed Weights   08/26/23 1930  Weight: 103.2 kg (227 lb 8.2 oz)    Body mass index is 35.63 kg/m.  Estimated Creatinine Clearance: 68.8 mL/min (by C-G formula based on SCr of 1.11 mg/dL).   Based on [facility] policy patient is candidate for enoxaparin 0.5mg /kg TBW SQ every 24 hours based on BMI being >30.  DESCRIPTION: Pharmacy has adjusted enoxaparin dose per [facility] policy.  Patient is now receiving enoxaparin 52.5 mg every 24 hours   Celene Squibb, PharmD Clinical Pharmacist 08/27/2023 8:36 AM

## 2023-08-27 NOTE — Progress Notes (Signed)
PHARMACY -  BRIEF ANTIBIOTIC NOTE   Pharmacy has received consult(s) for Vancomycin from an ED provider.  The patient's profile has been reviewed for ht/wt/allergies/indication/available labs.    One time order(s) placed for Vancomycin 2500 mg IV X 1  Further antibiotics/pharmacy consults should be ordered by admitting physician if indicated.                       Thank you, Encarnacion Scioneaux D 08/27/2023  12:44 AM

## 2023-08-27 NOTE — ED Notes (Signed)
Attempted to go give Tums, patient is asleep at this time and daughter asked not to wake him. Will give once he's up again. VSS, call light within reach.

## 2023-08-27 NOTE — ED Notes (Signed)
Patient called out with complaints of indigestion and burping. He is requesting his home dose omeprazole or some tums. Secure chat sent to Para March, MD. Awaiting response.

## 2023-08-27 NOTE — Progress Notes (Signed)
PROGRESS NOTE    Juan Watson  ZOX:[id number] DOB: November 30, 1951 DOA: 08/26/2023 PCP: Danella Penton, MD  Outpatient Specialists: vascular surgery    Brief Narrative:   72 y.o. male with medical history significant for HTN, DM, PVD s/p elective right femoral artery to peroneal artery bypass with vein graft 9/18, complicated by postoperative hypotension which improved with adjustment of antihypertensives, who presents to the ED with complaints of redness and drainage from his surgical incision site as noted by his home health nurse site as well as intermittent fevers.    Assessment & Plan:   Principal Problem:   Surgical wound infection Active Problems:   Surgical site infection   PAD (peripheral artery disease) (HCC)   PAD s/p femoral-peronealeal bypass surgery 08/18/23   Coronary artery disease involving native coronary artery of native heart   Diabetes mellitus, type 2 (HCC)   Benign essential HTN   S/P drug eluting coronary stent placement   Postoperative infection  # Surgical site infection # s/p fem-tib bypass # Sepsis Surgery 9/18 now with one day subjective fever, chills, feeling ill, with erythema and swelling surrounding right groin and right thigh incisions. Hemodynamically stable. Sepsis by tachycardia and leukocytosis. No appreciable abscess - vascular surgery to see - continue vanc, add ceftriaxone - wound care consult  # CAD # PAD Hx stent 2021 - continue asa, statin, plavix  # T2DM Glucose not elevated - continue SSI  # HTN Bp mild elevation. Appears hasn't been taking home meds - irbesartan for home prior telmisartan  # Chronic pain - home pregabalin  # Hypothyroid - home synthroid  # Gout - home febuxostat   DVT prophylaxis: lovenox Code Status: full Family Communication: wife and daughter updated @ bedside 9/27  Level of care: Med-Surg Status is: Inpatient Remains inpatient appropriate because: severity of illness    Consultants:   vascular  Procedures: None thus far  Antimicrobials:  Vanc/ceftriaxone    Subjective: Fever/chills resolved, feeling well  Objective: Vitals:   08/27/23 0330 08/27/23 0629 08/27/23 0700 08/27/23 0735  BP: (!) 166/81 (!) 174/72 (!) 142/125 (!) 152/94  Pulse: 85 96 98 93  Resp: 18 18  17   Temp:  (!) 97.4 F (36.3 C)    TempSrc:  Axillary    SpO2: 98% 98% 99% 98%  Weight:      Height:       No intake or output data in the 24 hours ending 08/27/23 0824 Filed Weights   08/26/23 1930  Weight: 103.2 kg    Examination:  General exam: Appears calm and comfortable  Respiratory system: Clear to auscultation. Respiratory effort normal. Cardiovascular system: S1 & S2 heard, RRR. No JVD, murmurs, rubs, gallops or clicks. No pedal edema. Gastrointestinal system: Abdomen is nondistended, soft and nontender. No organomegaly or masses felt. Normal bowel sounds heard. Central nervous system: Alert and oriented. No focal neurological deficits. Extremities: Symmetric 5 x 5 power. Skin: see media tab for photos. Induration and erythema around right groin and right thigh incisions which are intact Psychiatry: Judgement and insight appear normal. Mood & affect appropriate.     Data Reviewed: I have personally reviewed following labs and imaging studies  CBC: Recent Labs  Lab 08/21/23 1007 08/26/23 1930  WBC 14.3* 13.6*  NEUTROABS  --  8.6*  HGB 10.0* 10.7*  HCT 29.8* 32.7*  MCV 86.4 86.1  PLT 220 423*   Basic Metabolic Panel: Recent Labs  Lab 08/21/23 1007 08/26/23 1930  NA 133* 134*  K 3.5 3.5  CL 99 96*  CO2 25 24  GLUCOSE 145* 124*  BUN 16 16  CREATININE 1.15 1.11  CALCIUM 8.3* 8.9   GFR: Estimated Creatinine Clearance: 68.8 mL/min (by C-G formula based on SCr of 1.11 mg/dL). Liver Function Tests: Recent Labs  Lab 08/21/23 1007 08/26/23 1930  AST 51* 28  ALT 58* 39  ALKPHOS 40 44  BILITOT 0.9 0.7  PROT 6.5 6.7  ALBUMIN 3.7 3.8   No results for  input(s): "LIPASE", "AMYLASE" in the last 168 hours. No results for input(s): "AMMONIA" in the last 168 hours. Coagulation Profile: No results for input(s): "INR", "PROTIME" in the last 168 hours. Cardiac Enzymes: No results for input(s): "CKTOTAL", "CKMB", "CKMBINDEX", "TROPONINI" in the last 168 hours. BNP (last 3 results) No results for input(s): "PROBNP" in the last 8760 hours. HbA1C: No results for input(s): "HGBA1C" in the last 72 hours. CBG: Recent Labs  Lab 08/22/23 1638 08/22/23 2132 08/23/23 0819 08/27/23 0225 08/27/23 0743  GLUCAP 111* 187* 156* 137* 122*   Lipid Profile: No results for input(s): "CHOL", "HDL", "LDLCALC", "TRIG", "CHOLHDL", "LDLDIRECT" in the last 72 hours. Thyroid Function Tests: No results for input(s): "TSH", "T4TOTAL", "FREET4", "T3FREE", "THYROIDAB" in the last 72 hours. Anemia Panel: No results for input(s): "VITAMINB12", "FOLATE", "FERRITIN", "TIBC", "IRON", "RETICCTPCT" in the last 72 hours. Urine analysis:    Component Value Date/Time   COLORURINE YELLOW (A) 12/07/2019 0733   APPEARANCEUR HAZY (A) 12/07/2019 0733   APPEARANCEUR Clear 06/01/2016 1408   LABSPEC 1.024 12/07/2019 0733   PHURINE 5.0 12/07/2019 0733   GLUCOSEU NEGATIVE 12/07/2019 0733   HGBUR NEGATIVE 12/07/2019 0733   BILIRUBINUR NEGATIVE 12/07/2019 0733   BILIRUBINUR Negative 06/01/2016 1408   KETONESUR NEGATIVE 12/07/2019 0733   PROTEINUR 30 (A) 12/07/2019 0733   UROBILINOGEN 0.2 04/05/2015 1001   NITRITE NEGATIVE 12/07/2019 0733   LEUKOCYTESUR NEGATIVE 12/07/2019 0733   Sepsis Labs: @LABRCNTIP (procalcitonin:4,lacticidven:4)  ) Recent Results (from the past 240 hour(s))  Surgical pcr screen     Status: None   Collection Time: 08/17/23  3:14 PM   Specimen: Nasal Mucosa; Nasal Swab  Result Value Ref Range Status   MRSA, PCR NEGATIVE NEGATIVE Final   Staphylococcus aureus NEGATIVE NEGATIVE Final    Comment: (NOTE) The Xpert SA Assay (FDA approved for NASAL  specimens in patients 16 years of age and older), is one component of a comprehensive surveillance program. It is not intended to diagnose infection nor to guide or monitor treatment. Performed at [facility], [address] [place], [place] [address]   MRSA Next Gen by PCR, Nasal     Status: None   Collection Time: 08/18/23  8:43 PM   Specimen: Nasal Mucosa; Nasal Swab  Result Value Ref Range Status   MRSA by PCR Next Gen NOT DETECTED NOT DETECTED Final    Comment: (NOTE) The GeneXpert MRSA Assay (FDA approved for NASAL specimens only), is one component of a comprehensive MRSA colonization surveillance program. It is not intended to diagnose MRSA infection nor to guide or monitor treatment for MRSA infections. Test performance is not FDA approved in patients less than 17 years old. Performed at [facility], [address] [place], [place] [address]   Blood culture (single)     Status: None (Preliminary result)   Collection Time: 08/26/23  7:30 PM   Specimen: BLOOD  Result Value Ref Range Status   Specimen Description BLOOD BLOOD RIGHT ARM  Final   Special Requests  Final    BOTTLES DRAWN AEROBIC AND ANAEROBIC Blood Culture results may not be optimal due to an inadequate volume of blood received in culture bottles   Culture   Final    NO GROWTH < 12 HOURS Performed at [facility], [address] [place], [place] [address]    Report Status PENDING  Incomplete  Blood culture (single)     Status: None (Preliminary result)   Collection Time: 08/27/23 12:29 AM   Specimen: BLOOD  Result Value Ref Range Status   Specimen Description BLOOD BLOOD RIGHT ARM  Final   Special Requests   Final    BOTTLES DRAWN AEROBIC AND ANAEROBIC Blood Culture adequate volume   Culture   Final    NO GROWTH < 12 HOURS Performed at [facility], [address] [place], [place] [address]    Report Status PENDING  Incomplete         Radiology  Studies: No results found.      Scheduled Meds:  aspirin EC  81 mg Oral QHS   clopidogrel  75 mg Oral Q0600   enoxaparin (LOVENOX) injection  0.5 mg/kg Subcutaneous Q24H   febuxostat  80 mg Oral Q supper   insulin aspart  0-20 Units Subcutaneous TID WC   insulin aspart  0-5 Units Subcutaneous QHS   levothyroxine  75 mcg Oral Q0600   pantoprazole  40 mg Oral Daily   pregabalin  50 mg Oral Daily   rosuvastatin  10 mg Oral Q supper   Continuous Infusions:  [START ON 08/28/2023] vancomycin       LOS: 0 days     Silvano Bilis, MD [facility]   If 7PM-7AM, please contact night-coverage [email or web address] Password [facility] 08/27/2023, 8:24 AM

## 2023-08-27 NOTE — Assessment & Plan Note (Signed)
PAD Continue rosuvastatin and aspirin

## 2023-08-27 NOTE — H&P (Signed)
History and Physical    Patient: Juan Watson ZOX:[id number] DOB: December 27, 1950 DOA: 08/26/2023 DOS: the patient was seen and examined on 08/27/2023 PCP: Danella Penton, MD  Patient coming from: Home  Chief Complaint:  Chief Complaint  Patient presents with   Post-op Problem    HPI: Juan Watson is a 72 y.o. male with medical history significant for HTN, DM, PVD s/p elective right femoral artery to peroneal artery bypass with vein graft 9/18, complicated by postoperative hypotension which improved with adjustment of antihypertensives, who presents to the ED with complaints of redness and drainage from his surgical incision site as noted by his home health nurse site as well as intermittent fevers. ED course and data review: Vitals within normal limits. Labs: WBC 13,600 with lactic acid 1.7. Hemoglobin at baseline at 10.7 Other labs unremarkable  The ED provider: Spoke with vascular surgeon Dr. Wyn Quaker who recommended starting antibiotics and medicine admit. No imaging ordered. Patient started on Zosyn and vancomycin and given a fluid bolus. Hospitalist consulted for admission   Review of Systems: As mentioned in the history of present illness. All other systems reviewed and are negative.  Past Medical History:  Diagnosis Date   Acquired flat foot 11/15/2015   Arthritis of knee, degenerative 04/15/2015   BPH (benign prostatic hyperplasia)    Chewing tobacco nicotine dependence without complication 08/10/2020   Cholecystitis 06/28/2017   Coronary artery disease    Diabetes mellitus without complication (HCC)    Diabetes mellitus, type 2 (HCC) 12/13/2002   Overview:  DIET CONTROLLED    Disease of nail 07/16/2011   Encounter for screening for malignant neoplasm of prostate 10/29/2014   Essential (primary) hypertension 10/17/2010   Familial aortic aneurysm 03/26/2014   Family history of cardiovascular disease 03/26/2014   Gastro-esophageal reflux disease without esophagitis  08/11/2013   Generalized OA 08/11/2013   Gout 03/26/2014   Heart murmur    HOH (hard of hearing)    aids   Motion sickness    "sea sick"   Peripheral vascular disease (HCC)    Primary osteoarthritis of right knee 04/14/2015   Pure hypercholesterolemia 12/13/2002   Skin lesion 03/11/2015   Past Surgical History:  Procedure Laterality Date   BACK SURGERY  77,87   CARDIAC CATHETERIZATION  70's   pericarditis   CATARACT EXTRACTION W/PHACO Right 03/23/2016   Procedure: CATARACT EXTRACTION PHACO AND INTRAOCULAR LENS PLACEMENT (IOC);  Surgeon: Sallee Lange, MD;  Location: [facility];  Service: Ophthalmology;  Laterality: Right;  [place] 01:31    CATARACT EXTRACTION W/PHACO Left 10/23/2019   Procedure: CATARACT EXTRACTION PHACO AND INTRAOCULAR LENS PLACEMENT (IOC) LEFT DIABETIC TORIC LENS 5.82,   00:44.1;  Surgeon: Nevada Crane, MD;  Location: [facility];  Service: Ophthalmology;  Laterality: Left;  Diabetic - oral meds   CHOLECYSTECTOMY N/A 06/29/2017   Procedure: LAPAROSCOPIC CHOLECYSTECTOMY;  Surgeon: Henrene Dodge, MD;  Location: [facility];  Service: General;  Laterality: N/A;   COLONOSCOPY WITH PROPOFOL N/A 10/06/2017   Procedure: COLONOSCOPY WITH PROPOFOL;  Surgeon: Scot Jun, MD;  Location: [facility];  Service: Endoscopy;  Laterality: N/A;   CORONARY STENT INTERVENTION N/A 04/11/2020   Procedure: CORONARY STENT INTERVENTION;  Surgeon: Alwyn Pea, MD;  Location: [facility];  Service: Cardiovascular;  Laterality: N/A;   ENDARTERECTOMY FEMORAL Right 08/18/2023   Procedure: ENDARTERECTOMY FEMORAL;  Surgeon: Renford Dills, MD;  Location: [facility];  Service: Vascular;  Laterality: Right;   ENDARTERECTOMY POPLITEAL Right 08/18/2023  Procedure: ENDARTERECTOMY POPLITEAL;  Surgeon: Renford Dills, MD;  Location: [facility];  Service: Vascular;  Laterality: Right;   ENDARTERECTOMY TIBIOPERONEAL Right 08/18/2023   Procedure: ENDARTERECTOMY  TIBIOPERONEAL;  Surgeon: Renford Dills, MD;  Location: [facility];  Service: Vascular;  Laterality: Right;   FEMORAL-TIBIAL BYPASS GRAFT Right 08/18/2023   Procedure: BYPASS GRAFT FEMORAL-TIBIAL ARTERY (WITH VEIN);  Surgeon: Renford Dills, MD;  Location: [facility];  Service: Vascular;  Laterality: Right;   HERNIA REPAIR  11,   umbilical x2   KNEE ARTHROSCOPY Right 12/20/2017   Procedure: ARTHROSCOPY KNEE EXTENSIVE SYNOVECTOMY AND LYSIS OF ADHESIONS;  Surgeon: Donato Heinz, MD;  Location: [facility];  Service: Orthopedics;  Laterality: Right;   KNEE DEBRIDEMENT Left    tendon   LEFT HEART CATH AND CORONARY ANGIOGRAPHY Left 04/11/2020   Procedure: LEFT HEART CATH AND CORONARY ANGIOGRAPHY;  Surgeon: Lamar Blinks, MD;  Location: [facility];  Service: Cardiovascular;  Laterality: Left;   LOWER EXTREMITY ANGIOGRAPHY Left 09/24/2020   Procedure: LOWER EXTREMITY ANGIOGRAPHY;  Surgeon: Renford Dills, MD;  Location: [facility];  Service: Cardiovascular;  Laterality: Left;   LOWER EXTREMITY ANGIOGRAPHY Right 07/13/2023   Procedure: Lower Extremity Angiography;  Surgeon: Renford Dills, MD;  Location: [facility];  Service: Cardiovascular;  Laterality: Right;   TONSILLECTOMY  72   TOTAL KNEE ARTHROPLASTY Right 04/15/2015   Procedure: TOTAL KNEE ARTHROPLASTY;  Surgeon: Gean Birchwood, MD;  Location: [facility];  Service: Orthopedics;  Laterality: Right;   TRANSURETHRAL RESECTION OF PROSTATE  ?   Social History:  reports that he quit smoking about 31 years ago. His smoking use included cigarettes. He started smoking about 56 years ago. He has a 50 pack-year smoking history. His smokeless tobacco use includes chew. He reports that he does not drink alcohol and does not use drugs.  Allergies  Allergen Reactions   Bee Venom Shortness Of Breath and Swelling   Sulfa Antibiotics Swelling    Tongue swelling   Ben Gay Ultra [Menthol] Other (See Comments)    Whelts and  redness of skin    Family History  Problem Relation Age of Onset   Multiple myeloma Mother    Aneurysm Father    Aneurysm Brother    Nephrolithiasis Neg Hx    Bladder Cancer Neg Hx    Prostate cancer Neg Hx    Kidney cancer Neg Hx     Prior to Admission medications   Medication Sig Start Date End Date Taking? Authorizing Provider  clopidogrel (PLAVIX) 75 MG tablet Take 1 tablet (75 mg total) by mouth daily at 6 (six) AM. 08/24/23  Yes Pace, Brien R, NP  glucose blood test strip Use as instructed 04/30/20  Yes Theadore Nan, NP  metFORMIN (GLUCOPHAGE) 500 MG tablet Take 2 tablets (1,000 mg total) by mouth 2 (two) times daily with a meal. Patient taking differently: Take 500 mg by mouth 2 (two) times daily with a meal. 10/04/20  Yes Theadore Nan, NP  acetaminophen (TYLENOL) 500 MG tablet Take 1,000 mg by mouth every 6 (six) hours as needed for mild pain or headache.    [provider]  amLODipine (NORVASC) 10 MG tablet Take 1 tablet (10 mg total) by mouth daily. Patient not taking: Reported on 08/27/2023 12/17/20   Dale Regent, MD  ascorbic acid (VITAMIN C) 500 MG tablet Take 500 mg by mouth daily with supper.    [provider]  aspirin  EC 81 MG tablet Take 81 mg by mouth at bedtime.    [provider]  chlorthalidone (HYGROTON) 25 MG tablet Take 25 mg by mouth daily.    [provider]  cholecalciferol (VITAMIN D3) 25 MCG (1000 UNIT) tablet Take 1,000 Units by mouth daily with supper.    [provider]  cloNIDine (CATAPRES) 0.1 MG tablet TAKE 1 TABLET TWICE DAILY Patient not taking: Reported on 08/27/2023 08/28/20   Theadore Nan, NP  Coenzyme Q10 (CO Q-10) 100 MG CAPS Take 200 mg by mouth daily with supper.    [provider]  diclofenac (VOLTAREN) 75 MG EC tablet Take 1 tablet (75 mg total) by mouth 2 (two) times daily as needed. Patient not taking: Reported on 08/27/2023 02/03/21   Allegra Grana, FNP  Febuxostat 80  MG TABS Take 80 mg by mouth daily with supper.    [provider]  fluticasone (FLONASE) 50 MCG/ACT nasal spray Place 1 spray into both nostrils in the morning and at bedtime. 07/27/23   [provider]  labetalol (NORMODYNE) 200 MG tablet TAKE 1 TABLET TWICE DAILY Patient not taking: Reported on 08/27/2023 05/20/21   Allegra Grana, FNP  levothyroxine (SYNTHROID) 75 MCG tablet Take 75 mcg by mouth daily before breakfast. 08/04/23 08/03/24  [provider]  magnesium oxide (MAG-OX) 400 MG tablet Take 400 mg by mouth daily.    [provider]  MOUNJARO 7.5 MG/0.5ML Pen Inject 7.5 mg into the skin once a week. Monday weekly    [provider]  Omega-3 Fatty Acids (FISH OIL) 1200 MG CAPS Take 1,200 mg by mouth 2 (two) times daily.    [provider]  oxyCODONE-acetaminophen (PERCOCET/ROXICET) 5-325 MG tablet Take 1-2 tablets by mouth every 4 (four) hours as needed for moderate pain. 08/23/23   Pace, Peggye Ley, NP  pantoprazole (PROTONIX) 40 MG tablet Take 40 mg by mouth daily.    [provider]  pregabalin (LYRICA) 50 MG capsule Take 50 mg by mouth at bedtime.    [provider]  rosuvastatin (CRESTOR) 10 MG tablet Take 1 tablet (10 mg total) by mouth daily. Patient taking differently: Take 10 mg by mouth daily with supper. 11/08/20   Glori Luis, MD  telmisartan (MICARDIS) 80 MG tablet Take 80 mg by mouth daily with supper.  05/13/20 07/18/24  [provider]    Physical Exam: Vitals:   08/26/23 2353 08/26/23 2354 08/26/23 2356 08/27/23 0030  BP: 129/78  129/78 (!) 136/98  Pulse:  86 82 81  Resp:   18   Temp:   97.7 F (36.5 C)   TempSrc:   Oral   SpO2:  99% 100% 99%  Weight:      Height:       Physical Exam Vitals and nursing note reviewed.  Constitutional:      General: He is not in acute distress. HENT:     Head: Normocephalic and atraumatic.  Cardiovascular:     Rate and Rhythm: Normal rate and  regular rhythm.     Heart sounds: Normal heart sounds.  Pulmonary:     Effort: Pulmonary effort is normal.     Breath sounds: Normal breath sounds.  Abdominal:     Palpations: Abdomen is soft.     Tenderness: There is no abdominal tenderness.  Musculoskeletal:     Right lower leg: Edema present.     Comments: Swelling right lower extremity with redness and warmth around surgical  incisions as shown in picture below.  Mild oozing seen midway along groin incision.  Staples still in.   Neurological:     Mental Status: Mental status is at baseline.            Labs on Admission: I have personally reviewed following labs and imaging studies  CBC: Recent Labs  Lab 08/21/23 1007 08/26/23 1930  WBC 14.3* 13.6*  NEUTROABS  --  8.6*  HGB 10.0* 10.7*  HCT 29.8* 32.7*  MCV 86.4 86.1  PLT 220 423*   Basic Metabolic Panel: Recent Labs  Lab 08/21/23 1007 08/26/23 1930  NA 133* 134*  K 3.5 3.5  CL 99 96*  CO2 25 24  GLUCOSE 145* 124*  BUN 16 16  CREATININE 1.15 1.11  CALCIUM 8.3* 8.9   GFR: Estimated Creatinine Clearance: 68.8 mL/min (by C-G formula based on SCr of 1.11 mg/dL). Liver Function Tests: Recent Labs  Lab 08/21/23 1007 08/26/23 1930  AST 51* 28  ALT 58* 39  ALKPHOS 40 44  BILITOT 0.9 0.7  PROT 6.5 6.7  ALBUMIN 3.7 3.8   No results for input(s): "LIPASE", "AMYLASE" in the last 168 hours. No results for input(s): "AMMONIA" in the last 168 hours. Coagulation Profile: No results for input(s): "INR", "PROTIME" in the last 168 hours. Cardiac Enzymes: No results for input(s): "CKTOTAL", "CKMB", "CKMBINDEX", "TROPONINI" in the last 168 hours. BNP (last 3 results) No results for input(s): "PROBNP" in the last 8760 hours. HbA1C: No results for input(s): "HGBA1C" in the last 72 hours. CBG: Recent Labs  Lab 08/22/23 0830 08/22/23 1159 08/22/23 1638 08/22/23 2132 08/23/23 0819  GLUCAP 189* 128* 111* 187* 156*   Lipid Profile: No results for input(s):  "CHOL", "HDL", "LDLCALC", "TRIG", "CHOLHDL", "LDLDIRECT" in the last 72 hours. Thyroid Function Tests: No results for input(s): "TSH", "T4TOTAL", "FREET4", "T3FREE", "THYROIDAB" in the last 72 hours. Anemia Panel: No results for input(s): "VITAMINB12", "FOLATE", "FERRITIN", "TIBC", "IRON", "RETICCTPCT" in the last 72 hours. Urine analysis:    Component Value Date/Time   COLORURINE YELLOW (A) 12/07/2019 0733   APPEARANCEUR HAZY (A) 12/07/2019 0733   APPEARANCEUR Clear 06/01/2016 1408   LABSPEC 1.024 12/07/2019 0733   PHURINE 5.0 12/07/2019 0733   GLUCOSEU NEGATIVE 12/07/2019 0733   HGBUR NEGATIVE 12/07/2019 0733   BILIRUBINUR NEGATIVE 12/07/2019 0733   BILIRUBINUR Negative 06/01/2016 1408   KETONESUR NEGATIVE 12/07/2019 0733   PROTEINUR 30 (A) 12/07/2019 0733   UROBILINOGEN 0.2 04/05/2015 1001   NITRITE NEGATIVE 12/07/2019 0733   LEUKOCYTESUR NEGATIVE 12/07/2019 0733    Radiological Exams on Admission: No results found.   Data Reviewed: Relevant notes from primary care and specialist visits, past discharge summaries as available in EHR, including Care Everywhere. Prior diagnostic testing as pertinent to current admission diagnoses Updated medications and problem lists for reconciliation ED course, including vitals, labs, imaging, treatment and response to treatment Triage notes, nursing and pharmacy notes and ED provider's notes Notable results as noted in HPI   Assessment and Plan: Surgical site infection S/p femoroperoneal bypass 9/18 Continue vancomycin and add rocephin Wound care Vascular surgery consult  Coronary artery disease involving native coronary artery of native heart PAD Continue rosuvastatin and aspirin  Diabetes mellitus, type 2 (HCC) Sliding scale insulin coverage  Benign essential HTN Continue labetalol, telmisartan, amlodipine     DVT prophylaxis: Lovenox  Consults: vascular  Advance Care Planning:   Code Status: Prior   Family  Communication: Daughters at bedside  Disposition Plan: Back to previous home  environment  Severity of Illness: The appropriate patient status for this patient is INPATIENT. Inpatient status is judged to be reasonable and necessary in order to provide the required intensity of service to ensure the patient's safety. The patient's presenting symptoms, physical exam findings, and initial radiographic and laboratory data in the context of their chronic comorbidities is felt to place them at high risk for further clinical deterioration. Furthermore, it is not anticipated that the patient will be medically stable for discharge from the hospital within 2 midnights of admission.   * I certify that at the point of admission it is my clinical judgment that the patient will require inpatient hospital care spanning beyond 2 midnights from the point of admission due to high intensity of service, high risk for further deterioration and high frequency of surveillance required.*  Author: Andris Baumann, MD 08/27/2023 1:01 AM  For on call review [email or web address].

## 2023-08-27 NOTE — Progress Notes (Signed)
Anticoagulation monitoring(Lovenox):  72 yo  male ordered Lovenox 40 mg Q24h    Filed Weights   08/26/23 1930  Weight: 103.2 kg (227 lb 8.2 oz)   BMI 35.6   Lab Results  Component Value Date   CREATININE 1.11 08/26/2023   CREATININE 1.15 08/21/2023   CREATININE 1.10 08/19/2023   Estimated Creatinine Clearance: 68.8 mL/min (by C-G formula based on SCr of 1.11 mg/dL). Hemoglobin & Hematocrit     Component Value Date/Time   HGB 10.7 (L) 08/26/2023 1930   HCT 32.7 (L) 08/26/2023 1930     Per Protocol for Patient with estCrcl > 30 ml/min and BMI > 30, will transition to Lovenox 52.5 mg Q24h.

## 2023-08-27 NOTE — Telephone Encounter (Signed)
He has looks like he is currently in the ED and has been admitted and is being treated there

## 2023-08-27 NOTE — Consult Note (Addendum)
WOC consult requested for Vac application.   Secure chat message sent to the Vascular team as follows; "This patient was just admitted and there is a consult for a Vac;  I saw that the vascular team is following for assessment and plan of care. There are no WOC team members working at [facility] today; can your team please apply the Vac dressing and our team can change again on Mon. It will be greatly appreciated."  Vascular team states they will apply the Vac today and WOC team can change on Mon.   Thank-you,  Cammie Mcgee MSN, RN, CWOCN, [place], CNS [phone]

## 2023-08-27 NOTE — Consult Note (Addendum)
Hospital Consult    Reason for Consult:  Post Op wound infection.  Requesting Physician:  Dr Lindajo Royal MD  MRN #:  [id number]  History of Present Illness: This is a 72 y.o. male  who presents to the ED from home with a chief complaint of postoperative infection.  Patient had femoral-tibial bypass surgery on 08/18/2023 by Dr. Angelica Pou.  Has been recovering fine, states today was his best day.  Home health nurse noted sliding on his femoral site yesterday and applied Medihoney.  Today daughter noted increased redness with streaking.  Unsure if patient has been running fever because he has been taking scheduled Tylenol to control his pain.  Denies chills, chest pain, shortness of breath, abdominal pain, nausea, vomiting or dizziness. Vascular Surgery consulted to evaluate.   Past Medical History:  Diagnosis Date   Acquired flat foot 11/15/2015   Arthritis of knee, degenerative 04/15/2015   BPH (benign prostatic hyperplasia)    Chewing tobacco nicotine dependence without complication 08/10/2020   Cholecystitis 06/28/2017   Coronary artery disease    Diabetes mellitus without complication (HCC)    Diabetes mellitus, type 2 (HCC) 12/13/2002   Overview:  DIET CONTROLLED    Disease of nail 07/16/2011   Encounter for screening for malignant neoplasm of prostate 10/29/2014   Essential (primary) hypertension 10/17/2010   Familial aortic aneurysm 03/26/2014   Family history of cardiovascular disease 03/26/2014   Gastro-esophageal reflux disease without esophagitis 08/11/2013   Generalized OA 08/11/2013   Gout 03/26/2014   Heart murmur    HOH (hard of hearing)    aids   Motion sickness    "sea sick"   Peripheral vascular disease (HCC)    Primary osteoarthritis of right knee 04/14/2015   Pure hypercholesterolemia 12/13/2002   Skin lesion 03/11/2015    Past Surgical History:  Procedure Laterality Date   BACK SURGERY  [age]   CARDIAC CATHETERIZATION  70's   pericarditis   CATARACT  EXTRACTION W/PHACO Right 03/23/2016   Procedure: CATARACT EXTRACTION PHACO AND INTRAOCULAR LENS PLACEMENT (IOC);  Surgeon: Sallee Lange, MD;  Location: [facility];  Service: Ophthalmology;  Laterality: Right;  [place] 01:31    CATARACT EXTRACTION W/PHACO Left 10/23/2019   Procedure: CATARACT EXTRACTION PHACO AND INTRAOCULAR LENS PLACEMENT (IOC) LEFT DIABETIC TORIC LENS 5.82,   00:44.1;  Surgeon: Nevada Crane, MD;  Location: [facility];  Service: Ophthalmology;  Laterality: Left;  Diabetic - oral meds   CHOLECYSTECTOMY N/A 06/29/2017   Procedure: LAPAROSCOPIC CHOLECYSTECTOMY;  Surgeon: Henrene Dodge, MD;  Location: [facility];  Service: General;  Laterality: N/A;   COLONOSCOPY WITH PROPOFOL N/A 10/06/2017   Procedure: COLONOSCOPY WITH PROPOFOL;  Surgeon: Scot Jun, MD;  Location: [facility];  Service: Endoscopy;  Laterality: N/A;   CORONARY STENT INTERVENTION N/A 04/11/2020   Procedure: CORONARY STENT INTERVENTION;  Surgeon: Alwyn Pea, MD;  Location: [facility];  Service: Cardiovascular;  Laterality: N/A;   ENDARTERECTOMY FEMORAL Right 08/18/2023   Procedure: ENDARTERECTOMY FEMORAL;  Surgeon: Renford Dills, MD;  Location: [facility];  Service: Vascular;  Laterality: Right;   ENDARTERECTOMY POPLITEAL Right 08/18/2023   Procedure: ENDARTERECTOMY POPLITEAL;  Surgeon: Renford Dills, MD;  Location: [facility];  Service: Vascular;  Laterality: Right;   ENDARTERECTOMY TIBIOPERONEAL Right 08/18/2023   Procedure: ENDARTERECTOMY TIBIOPERONEAL;  Surgeon: Renford Dills, MD;  Location: [facility];  Service: Vascular;  Laterality: Right;   FEMORAL-TIBIAL BYPASS GRAFT Right 08/18/2023   Procedure: BYPASS GRAFT FEMORAL-TIBIAL ARTERY (WITH VEIN);  Surgeon: Renford Dills, MD;  Location: [facility];  Service: Vascular;  Laterality: Right;   HERNIA REPAIR  11,   umbilical x2   KNEE ARTHROSCOPY Right 12/20/2017   Procedure: ARTHROSCOPY KNEE EXTENSIVE SYNOVECTOMY AND LYSIS  OF ADHESIONS;  Surgeon: Donato Heinz, MD;  Location: [facility];  Service: Orthopedics;  Laterality: Right;   KNEE DEBRIDEMENT Left    tendon   LEFT HEART CATH AND CORONARY ANGIOGRAPHY Left 04/11/2020   Procedure: LEFT HEART CATH AND CORONARY ANGIOGRAPHY;  Surgeon: Lamar Blinks, MD;  Location: [facility];  Service: Cardiovascular;  Laterality: Left;   LOWER EXTREMITY ANGIOGRAPHY Left 09/24/2020   Procedure: LOWER EXTREMITY ANGIOGRAPHY;  Surgeon: Renford Dills, MD;  Location: [facility];  Service: Cardiovascular;  Laterality: Left;   LOWER EXTREMITY ANGIOGRAPHY Right 07/13/2023   Procedure: Lower Extremity Angiography;  Surgeon: Renford Dills, MD;  Location: [facility];  Service: Cardiovascular;  Laterality: Right;   TONSILLECTOMY  72   TOTAL KNEE ARTHROPLASTY Right 04/15/2015   Procedure: TOTAL KNEE ARTHROPLASTY;  Surgeon: Gean Birchwood, MD;  Location: [facility];  Service: Orthopedics;  Laterality: Right;   TRANSURETHRAL RESECTION OF PROSTATE  ?    Allergies  Allergen Reactions   Bee Venom Shortness Of Breath and Swelling   Sulfa Antibiotics Swelling    Tongue swelling   Ben Gay Ultra [Menthol] Other (See Comments)    Whelts and redness of skin    Prior to Admission medications   Medication Sig Start Date End Date Taking? Authorizing Provider  acetaminophen (TYLENOL) 500 MG tablet Take 1,000 mg by mouth every 6 (six) hours as needed for mild pain or headache.   Yes [provider]  ascorbic acid (VITAMIN C) 500 MG tablet Take 500 mg by mouth daily with supper.   Yes [provider]  aspirin EC 81 MG tablet Take 81 mg by mouth at bedtime.   Yes [provider]  cholecalciferol (VITAMIN D3) 25 MCG (1000 UNIT) tablet Take 1,000 Units by mouth daily with supper.   Yes [provider]  clopidogrel (PLAVIX) 75 MG tablet Take 1 tablet (75 mg total) by mouth daily at 6 (six) AM. 08/24/23  Yes Neyra Pettie R, NP  Coenzyme  Q10 (CO Q-10) 100 MG CAPS Take 200 mg by mouth daily with supper.   Yes [provider]  Febuxostat 80 MG TABS Take 80 mg by mouth daily with supper.   Yes [provider]  fluticasone (FLONASE) 50 MCG/ACT nasal spray Place 1 spray into both nostrils in the morning and at bedtime.   Yes [provider]  glucose blood test strip Use as instructed 04/30/20  Yes Theadore Nan, NP  levothyroxine (SYNTHROID) 75 MCG tablet Take 75 mcg by mouth daily before breakfast.   Yes [provider]  magnesium oxide (MAG-OX) 400 MG tablet Take 400 mg by mouth daily.   Yes [provider]  metFORMIN (GLUCOPHAGE) 500 MG tablet Take 2 tablets (1,000 mg total) by mouth 2 (two) times daily with a meal. Patient taking differently: Take 500 mg by mouth 2 (two) times daily with a meal. 10/04/20  Yes Theadore Nan, NP  MOUNJARO 7.5 MG/0.5ML Pen Inject 7.5 mg into the skin every Monday.   Yes [provider]  Omega-3 Fatty Acids (FISH OIL) 1200 MG CAPS Take 1,200 mg by mouth 2 (two) times daily.   Yes [provider]  oxyCODONE-acetaminophen (PERCOCET/ROXICET) 5-325 MG tablet  Take 1-2 tablets by mouth every 4 (four) hours as needed for moderate pain. 08/23/23  Yes Dortha Neighbors R, NP  pantoprazole (PROTONIX) 40 MG tablet Take 40 mg by mouth daily.   Yes [provider]  pregabalin (LYRICA) 50 MG capsule Take 50 mg by mouth daily.   Yes [provider]  rosuvastatin (CRESTOR) 10 MG tablet Take 1 tablet (10 mg total) by mouth daily. Patient taking differently: Take 10 mg by mouth daily with supper. 11/08/20  Yes Glori Luis, MD  amLODipine (NORVASC) 10 MG tablet Take 1 tablet (10 mg total) by mouth daily. Patient not taking: Reported on 08/27/2023 12/17/20   Dale Dayton, MD  chlorthalidone (HYGROTON) 25 MG tablet Take 25 mg by mouth daily. Patient not taking: Reported on 08/27/2023    [provider]  cloNIDine (CATAPRES) 0.1  MG tablet TAKE 1 TABLET TWICE DAILY Patient not taking: Reported on 08/27/2023 08/28/20   Theadore Nan, NP  diclofenac (VOLTAREN) 75 MG EC tablet Take 1 tablet (75 mg total) by mouth 2 (two) times daily as needed. Patient not taking: Reported on 08/27/2023 02/03/21   Allegra Grana, FNP  labetalol (NORMODYNE) 200 MG tablet TAKE 1 TABLET TWICE DAILY Patient not taking: Reported on 08/27/2023 05/20/21   Allegra Grana, FNP  telmisartan (MICARDIS) 80 MG tablet Take 80 mg by mouth daily with supper.  Patient not taking: Reported on 08/27/2023    [provider]    Social History   Socioeconomic History   Marital status: Married    Spouse name: Bonita Quin    Number of children: 2   Years of education: Not on file   Highest education level: Not on file  Occupational History   Occupation: Retired   Tobacco Use   Smoking status: Former    Current packs/day: 0.00    Average packs/day: 2.0 packs/day for 25.0 years (50.0 ttl pk-yrs)    Types: Cigarettes    Start date: 07/16/1967    Quit date: 07/15/1992    Years since quitting: 31.1   Smokeless tobacco: Current    Types: Chew  Vaping Use   Vaping status: Never Used  Substance and Sexual Activity   Alcohol use: No   Drug use: No   Sexual activity: Not Currently  Other Topics Concern   Not on file  Social History Narrative   Lives at home with wife private residence   Social Determinants of Health   Financial Resource Strain: Low Risk  (02/23/2023)   Received from [facility], [facility]   Overall Financial Resource Strain (CARDIA)    Difficulty of Paying Living Expenses: Not hard at all  Food Insecurity: No Food Insecurity (08/18/2023)   Hunger Vital Sign    Worried About Running Out of Food in the Last Year: Never true    Ran Out of Food in the Last Year: Never true  Transportation Needs: No Transportation Needs (08/18/2023)   PRAPARE - Administrator, Civil Service  (Medical): No    Lack of Transportation (Non-Medical): No  Physical Activity: Not on file  Stress: No Stress Concern Present (06/11/2020)   [employer] of Occupational Health - Occupational Stress Questionnaire    Feeling of Stress : Not at all  Social Connections: Unknown (06/11/2020)   Social Connection and Isolation Panel [NHANES]    Frequency of Communication with Friends and Family: More than three times a week    Frequency of Social Gatherings  with Friends and Family: More than three times a week    Attends Religious Services: Not on file    Active Member of Clubs or Organizations: Not on file    Attends Club or Organization Meetings: Not on file    Marital Status: Married  Intimate Partner Violence: Not At Risk (08/18/2023)   Humiliation, Afraid, Rape, and Kick questionnaire    Fear of Current or Ex-Partner: No    Emotionally Abused: No    Physically Abused: No    Sexually Abused: No     Family History  Problem Relation Age of Onset   Multiple myeloma Mother    Aneurysm Father    Aneurysm Brother    Nephrolithiasis Neg Hx    Bladder Cancer Neg Hx    Prostate cancer Neg Hx    Kidney cancer Neg Hx     ROS: Otherwise negative unless mentioned in HPI  Physical Examination  Vitals:   08/27/23 0629 08/27/23 0700  BP: (!) 174/72 (!) 142/125  Pulse: 96 98  Resp: 18   Temp: (!) 97.4 F (36.3 C)   SpO2: 98% 99%   Body mass index is 35.63 kg/m.  General:  WDWN in NAD Gait: Not observed HENT: WNL, normocephalic Pulmonary: normal non-labored breathing, without Rales, rhonchi,  wheezing Cardiac: regular, without  Murmurs, rubs or gallops; without carotid bruits Abdomen: Positive bowel sounds, soft, NT/ND, no masses Skin: without rashes Vascular Exam/Pulses: palpable pulses throughout Extremities: without ischemic changes, without Gangrene , without cellulitis; without open wounds;  Musculoskeletal: no muscle wasting or atrophy  Neurologic: A&O X 3;  No focal  weakness or paresthesias are detected; speech is fluent/normal Psychiatric:  The pt has Normal affect. Lymph:  Unremarkable  CBC    Component Value Date/Time   WBC 13.6 (H) 08/26/2023 1930   RBC 3.80 (L) 08/26/2023 1930   HGB 10.7 (L) 08/26/2023 1930   HCT 32.7 (L) 08/26/2023 1930   PLT 423 (H) 08/26/2023 1930   MCV 86.1 08/26/2023 1930   MCH 28.2 08/26/2023 1930   MCHC 32.7 08/26/2023 1930   RDW 13.2 08/26/2023 1930   LYMPHSABS 2.5 08/26/2023 1930   MONOABS 1.3 (H) 08/26/2023 1930   EOSABS 0.8 (H) 08/26/2023 1930   BASOSABS 0.1 08/26/2023 1930    BMET    Component Value Date/Time   NA 134 (L) 08/26/2023 1930   K 3.5 08/26/2023 1930   CL 96 (L) 08/26/2023 1930   CO2 24 08/26/2023 1930   GLUCOSE 124 (H) 08/26/2023 1930   BUN 16 08/26/2023 1930   CREATININE 1.11 08/26/2023 1930   CALCIUM 8.9 08/26/2023 1930   GFRNONAA >60 08/26/2023 1930   GFRAA >60 02/13/2020 1809    COAGS: Lab Results  Component Value Date   INR 0.96 04/05/2015     Non-Invasive Vascular Imaging:   Nine Ordered.   Statin:  Yes.   Beta Blocker:  Yes.   Aspirin:  Yes.   ACEI:  No. ARB:  Yes.   CCB use:  Yes Other antiplatelets/anticoagulants:  Yes.   Plavix    ASSESSMENT/PLAN: This is a 72 y.o. male who presents to [facility] ER with right groin post operative infection.   PLAN: Admit for IV antibiotics Wound Vac Application to wound. Culture wound with sensitivities.   -I discussed the plan in detail with Dr Festus Barren MD and he agrees with the plan.    Marcie Bal [facility] 08/27/2023 7:11 AM

## 2023-08-27 NOTE — Hospital Course (Signed)
HTN, IIDM, PVD s/p elective right femoral artery to peroneal artery bypass with vein graft 9/18, complicated by postoperative hypotension which improved with adjustment of antihypertensives, who presents to the ED with complaints of redness and drainage from his surgical incision site as well as intermittent fevers. ED course and data review: Vitals within normal limits. Labs: WBC 13,600 with lactic acid 1.7. Hemoglobin at baseline at 10.7 Other labs unremarkable  The ED provider: Spoke with vascular surgeon Dr. Wyn Quaker who recommended starting antibiotics and medicine admit. Patient started on Zosyn and vancomycin and given a fluid bolus. Hospitalist consulted for admission

## 2023-08-27 NOTE — Assessment & Plan Note (Signed)
Continue labetalol, telmisartan, amlodipine

## 2023-08-27 NOTE — Plan of Care (Signed)
  Problem: Education: Goal: Understanding of CV disease, CV risk reduction, and recovery process will improve Outcome: Progressing Goal: Individualized Educational Video(s) Outcome: Progressing   Problem: Health Behavior/Discharge Planning: Goal: Ability to safely manage health-related needs after discharge will improve Outcome: Progressing   Problem: Education: Goal: Knowledge of prescribed regimen will improve Outcome: Progressing   Problem: Bowel/Gastric: Goal: Gastrointestinal status for postoperative course will improve Outcome: Progressing   Problem: Clinical Measurements: Goal: Postoperative complications will be avoided or minimized Outcome: Progressing

## 2023-08-27 NOTE — Progress Notes (Signed)
Pharmacy Antibiotic Note  Juan Watson is a 72 y.o. male admitted on 08/26/2023 with  wound infection .  Pharmacy has been consulted for Vancomycin dosing.  Plan: Vancomycin 1 gm IV X 1 given in ED on 9/27 @ 0121 followed by additional Vanc 1500 mg IV X 1 to make total loading dose of 2500 mg.  Vancomycin 1250 mg IV Q24H ordered to start on 9/28 @ 0100.   AUC = 474.3 Vanc trough = 10.9   Height: 5\' 7"  (170.2 cm) Weight: 103.2 kg (227 lb 8.2 oz) IBW/kg (Calculated) : 66.1  Temp (24hrs), Avg:98 F (36.7 C), Min:97.7 F (36.5 C), Max:98.2 F (36.8 C)  Recent Labs  Lab 08/21/23 1007 08/26/23 1930  WBC 14.3* 13.6*  CREATININE 1.15 1.11  LATICACIDVEN  --  1.7    Estimated Creatinine Clearance: 68.8 mL/min (by C-G formula based on SCr of 1.11 mg/dL).    Allergies  Allergen Reactions   Bee Venom Shortness Of Breath and Swelling   Sulfa Antibiotics Swelling    Tongue swelling   Ben Gay Ultra [Menthol] Other (See Comments)    Whelts and redness of skin    Antimicrobials this admission:   >>    >>   Dose adjustments this admission:   Microbiology results:  BCx:   UCx:    Sputum:    MRSA PCR:   Thank you for allowing pharmacy to be a part of this patient's care.  Jadamarie Butson D 08/27/2023 2:23 AM

## 2023-08-27 NOTE — Assessment & Plan Note (Addendum)
S/p femoroperoneal bypass 9/18 Continue vancomycin and add rocephin Wound care Vascular surgery consult

## 2023-08-27 NOTE — ED Notes (Signed)
Patient medicated and back to resting quietly with eyes closed closed. Reconnected to monitor, VSS, call light within reach. No other needs identified at this time. Call light within reach.

## 2023-08-28 DIAGNOSIS — T8149XA Infection following a procedure, other surgical site, initial encounter: Secondary | ICD-10-CM | POA: Diagnosis not present

## 2023-08-28 LAB — CBC
HCT: 28.4 % — ABNORMAL LOW (ref 39.0–52.0)
Hemoglobin: 9.8 g/dL — ABNORMAL LOW (ref 13.0–17.0)
MCH: 29 pg (ref 26.0–34.0)
MCHC: 34.5 g/dL (ref 30.0–36.0)
MCV: 84 fL (ref 80.0–100.0)
Platelets: 333 10*3/uL (ref 150–400)
RBC: 3.38 MIL/uL — ABNORMAL LOW (ref 4.22–5.81)
RDW: 13.2 % (ref 11.5–15.5)
WBC: 13.1 10*3/uL — ABNORMAL HIGH (ref 4.0–10.5)
nRBC: 0 % (ref 0.0–0.2)

## 2023-08-28 LAB — BASIC METABOLIC PANEL WITH GFR
Anion gap: 8 (ref 5–15)
BUN: 9 mg/dL (ref 8–23)
CO2: 25 mmol/L (ref 22–32)
Calcium: 8.5 mg/dL — ABNORMAL LOW (ref 8.9–10.3)
Chloride: 104 mmol/L (ref 98–111)
Creatinine, Ser: 0.92 mg/dL (ref 0.61–1.24)
GFR, Estimated: >60 mL/min
Glucose, Bld: 135 mg/dL — ABNORMAL HIGH (ref 70–99)
Potassium: 3.9 mmol/L (ref 3.5–5.1)
Sodium: 137 mmol/L (ref 135–145)

## 2023-08-28 LAB — GLUCOSE, CAPILLARY: Glucose-Capillary: 136 mg/dL — ABNORMAL HIGH (ref 70–99)

## 2023-08-28 MED ORDER — AMOXICILLIN 875 MG PO TABS: 875.0000 mg | ORAL_TABLET | Freq: Two times a day (BID) | ORAL | 0 refills | Status: DC

## 2023-08-28 MED ORDER — DOXYCYCLINE HYCLATE 100 MG PO CAPS: 100.0000 mg | ORAL_CAPSULE | Freq: Two times a day (BID) | ORAL | 0 refills | Status: DC

## 2023-08-28 NOTE — Plan of Care (Signed)
  Problem: Education: Goal: Understanding of CV disease, CV risk reduction, and recovery process will improve Outcome: Progressing Goal: Individualized Educational Video(s) Outcome: Progressing   Problem: Activity: Goal: Ability to return to baseline activity level will improve Outcome: Progressing   Problem: Cardiovascular: Goal: Ability to achieve and maintain adequate cardiovascular perfusion will improve Outcome: Progressing Goal: Vascular access site(s) Level 0-1 will be maintained Outcome: Progressing   Problem: Health Behavior/Discharge Planning: Goal: Ability to safely manage health-related needs after discharge will improve Outcome: Progressing   Problem: Education: Goal: Knowledge of prescribed regimen will improve Outcome: Progressing   Problem: Activity: Goal: Ability to tolerate increased activity will improve Outcome: Progressing   Problem: Bowel/Gastric: Goal: Gastrointestinal status for postoperative course will improve Outcome: Progressing   Problem: Clinical Measurements: Goal: Postoperative complications will be avoided or minimized Outcome: Progressing Goal: Signs and symptoms of graft occlusion will improve Outcome: Progressing   Problem: Skin Integrity: Goal: Demonstration of wound healing without infection will improve Outcome: Progressing   Problem: Clinical Measurements: Goal: Ability to avoid or minimize complications of infection will improve Outcome: Progressing   Problem: Skin Integrity: Goal: Skin integrity will improve Outcome: Progressing   Problem: Education: Goal: Knowledge of General Education information will improve Description: Including pain rating scale, medication(s)/side effects and non-pharmacologic comfort measures Outcome: Progressing   Problem: Health Behavior/Discharge Planning: Goal: Ability to manage health-related needs will improve Outcome: Progressing   Problem: Clinical Measurements: Goal: Ability to  maintain clinical measurements within normal limits will improve Outcome: Progressing Goal: Will remain free from infection Outcome: Progressing Goal: Diagnostic test results will improve Outcome: Progressing Goal: Respiratory complications will improve Outcome: Progressing Goal: Cardiovascular complication will be avoided Outcome: Progressing   Problem: Activity: Goal: Risk for activity intolerance will decrease Outcome: Progressing   Problem: Nutrition: Goal: Adequate nutrition will be maintained Outcome: Progressing   Problem: Coping: Goal: Level of anxiety will decrease Outcome: Progressing   Problem: Elimination: Goal: Will not experience complications related to bowel motility Outcome: Progressing Goal: Will not experience complications related to urinary retention Outcome: Progressing   Problem: Pain Managment: Goal: General experience of comfort will improve Outcome: Progressing   Problem: Safety: Goal: Ability to remain free from injury will improve Outcome: Progressing   Problem: Skin Integrity: Goal: Risk for impaired skin integrity will decrease Outcome: Progressing

## 2023-08-28 NOTE — Discharge Summary (Signed)
Juan Watson:[id number] DOB: 1951-02-14 DOA: 08/26/2023  PCP: Danella Penton, MD  Admit date: 08/26/2023 Discharge date: 08/28/2023  Time spent: 35 minutes  Recommendations for Outpatient Follow-up:  Vascular f/u 1 week (also has pcp f/u in 3 days) Attention to BP at PCP f/u     Discharge Diagnoses:  Principal Problem:   Surgical wound infection Active Problems:   Surgical site infection   PAD (peripheral artery disease) (HCC)   PAD s/p femoral-peronealeal bypass surgery 08/18/23   Coronary artery disease involving native coronary artery of native heart   Diabetes mellitus, type 2 (HCC)   Benign essential HTN   S/P drug eluting coronary stent placement   Postoperative infection   Discharge Condition: improved  Diet recommendation: heart healthy  Filed Weights   08/26/23 1930 08/27/23 1208 08/28/23 0300  Weight: 103.2 kg 99.2 kg 99.5 kg    History of present illness:  From admission h and p DAEGON DEISS is a 72 y.o. male with medical history significant for HTN, DM, PVD s/p elective right femoral artery to peroneal artery bypass with vein graft 9/18, complicated by postoperative hypotension which improved with adjustment of antihypertensives, who presents to the ED with complaints of redness and drainage from his surgical incision site as noted by his home health nurse site as well as intermittent fevers.   Hospital Course:  Patient is post-op from 9/18 fem-tib bypass surgery presenting with cellulitis of surgical site (groin and right thigh). Vascular followed. Wound vac applied to right groin (preveena). Treated with vancomycin and ceftriaxone. Symptomatically much improved, labs stable. Discharging with 7 day course (will have had 9 days total abx) amoxicillin and doxycycline. Wound culture results are pending but gram stain shows GPCs and GP rods. Patient has pcp f/u in 3 days where wound check can be performed, and vascular advising f/u in their clinic in one week.  All BP meds held at recent hospital discharge, here BPs in the 150s and family reports them to be there at home, I thus advised patient to re-start his home ARB and to f/u with PCP this week for further adjustments if necessary.  Procedures: none   Consultations: Vascular surgery  Discharge Exam: Vitals:   08/27/23 2254 08/28/23 0723  BP: 139/60 (!) 152/73  Pulse: 84 96  Resp: 18 14  Temp: 98.7 F (37.1 C) 98.9 F (37.2 C)  SpO2: 95% 98%    General: NAD Cardiovascular: RRR Respiratory: CTAB Skin: interval improvement in erythema and induration surrounding right groin incision and right upper thigh incision. Staples intact.  Discharge Instructions   Discharge Instructions     Diet - low sodium heart healthy   Complete by: As directed    Increase activity slowly   Complete by: As directed    Leave dressing on - Keep it clean, dry, and intact until clinic visit   Complete by: As directed       Allergies as of 08/28/2023       Reactions   Bee Venom Shortness Of Breath, Swelling   Sulfa Antibiotics Swelling   Tongue swelling   Ben Gay Ultra [menthol] Other (See Comments)   Whelts and redness of skin        Medication List     STOP taking these medications    amLODipine 10 MG tablet Commonly known as: NORVASC   chlorthalidone 25 MG tablet Commonly known as: HYGROTON   cloNIDine 0.1 MG tablet Commonly known as: CATAPRES   diclofenac 75 MG  EC tablet Commonly known as: VOLTAREN   labetalol 200 MG tablet Commonly known as: NORMODYNE       TAKE these medications    acetaminophen 500 MG tablet Commonly known as: TYLENOL Take 1,000 mg by mouth every 6 (six) hours as needed for mild pain or headache.   amoxicillin 875 MG tablet Commonly known as: AMOXIL Take 1 tablet (875 mg total) by mouth 2 (two) times daily for 7 days.   ascorbic acid 500 MG tablet Commonly known as: VITAMIN C Take 500 mg by mouth daily with supper.   aspirin EC 81 MG  tablet Take 81 mg by mouth at bedtime.   cholecalciferol 25 MCG (1000 UNIT) tablet Commonly known as: VITAMIN D3 Take 1,000 Units by mouth daily with supper.   clopidogrel 75 MG tablet Commonly known as: PLAVIX Take 1 tablet (75 mg total) by mouth daily at 6 (six) AM.   Co Q-10 100 MG Caps Take 200 mg by mouth daily with supper.   doxycycline 100 MG capsule Commonly known as: VIBRAMYCIN Take 1 capsule (100 mg total) by mouth 2 (two) times daily.   Febuxostat 80 MG Tabs Take 80 mg by mouth daily with supper.   Fish Oil 1200 MG Caps Take 1,200 mg by mouth 2 (two) times daily.   fluticasone 50 MCG/ACT nasal spray Commonly known as: FLONASE Place 1 spray into both nostrils in the morning and at bedtime.   glucose blood test strip Use as instructed   levothyroxine 75 MCG tablet Commonly known as: SYNTHROID Take 75 mcg by mouth daily before breakfast.   magnesium oxide 400 MG tablet Commonly known as: MAG-OX Take 400 mg by mouth daily.   metFORMIN 500 MG tablet Commonly known as: GLUCOPHAGE Take 2 tablets (1,000 mg total) by mouth 2 (two) times daily with a meal. What changed: how much to take   Mounjaro 7.5 MG/0.5ML Pen Generic drug: tirzepatide Inject 7.5 mg into the skin every Monday.   oxyCODONE-acetaminophen 5-325 MG tablet Commonly known as: PERCOCET/ROXICET Take 1-2 tablets by mouth every 4 (four) hours as needed for moderate pain.   pantoprazole 40 MG tablet Commonly known as: PROTONIX Take 40 mg by mouth daily.   pregabalin 50 MG capsule Commonly known as: LYRICA Take 50 mg by mouth daily.   rosuvastatin 10 MG tablet Commonly known as: Crestor Take 1 tablet (10 mg total) by mouth daily. What changed: when to take this   telmisartan 80 MG tablet Commonly known as: MICARDIS Take 80 mg by mouth daily with supper.               Discharge Care Instructions  (From admission, onward)           Start     Ordered   08/28/23 0000  Leave  dressing on - Keep it clean, dry, and intact until clinic visit        08/28/23 1031           Allergies  Allergen Reactions   Bee Venom Shortness Of Breath and Swelling   Sulfa Antibiotics Swelling    Tongue swelling   Ben Gay Ultra [Menthol] Other (See Comments)    Whelts and redness of skin    Follow-up Information     Danella Penton, MD Follow up.   Specialty: Internal Medicine Why: as scheduled Contact information: [address] [facility] [place] [address] [phone]         Annice Needy,  MD Follow up.   Specialties: Vascular Surgery, Radiology, Interventional Cardiology Why: call to schedule f/u in 1 week Contact information: [address] [place] [address] [phone]                  The results of significant diagnostics from this hospitalization (including imaging, microbiology, ancillary and laboratory) are listed below for reference.    Significant Diagnostic Studies: ECHOCARDIOGRAM COMPLETE  Result Date: 08/23/2023    ECHOCARDIOGRAM REPORT   Patient Name:   BRADRICK KAMAU Date of Exam: 08/23/2023 Medical Rec #:  [id number]      Height:       67.0 in Accession #:    [id number]     Weight:       227.7 lb Date of Birth:  1950-12-30      BSA:          2.137 m Patient Age:    72 years       BP:           132/65 mmHg Patient Gender: M              HR:           81 bpm. Exam Location:  [facility] Procedure: 2D Echo, Cardiac Doppler and Color Doppler Indications:     Murmur R01.1  History:         Patient has no prior history of Echocardiogram examinations.  Sonographer:     Cristela Blue Referring Phys:  [id number] Emeline General Diagnosing Phys: Marcina Millard MD IMPRESSIONS  1. Left ventricular ejection fraction, by estimation, is 60 to 65%. The left ventricle has normal function. The left ventricle has no regional wall motion abnormalities. Left ventricular diastolic parameters are consistent with Grade I  diastolic dysfunction (impaired relaxation).  2. Right ventricular systolic function is normal. The right ventricular size is normal.  3. The mitral valve is normal in structure. Mild mitral valve regurgitation. No evidence of mitral stenosis.  4. The aortic valve is normal in structure. Aortic valve regurgitation is not visualized. Aortic valve sclerosis is present, with no evidence of aortic valve stenosis.  5. The inferior vena cava is normal in size with greater than 50% respiratory variability, suggesting right atrial pressure of 3 mmHg. FINDINGS  Left Ventricle: Left ventricular ejection fraction, by estimation, is 60 to 65%. The left ventricle has normal function. The left ventricle has no regional wall motion abnormalities. The left ventricular internal cavity size was normal in size. There is  no left ventricular hypertrophy. Left ventricular diastolic parameters are consistent with Grade I diastolic dysfunction (impaired relaxation). Right Ventricle: The right ventricular size is normal. No increase in right ventricular wall thickness. Right ventricular systolic function is normal. Left Atrium: Left atrial size was normal in size. Right Atrium: Right atrial size was normal in size. Pericardium: There is no evidence of pericardial effusion. Mitral Valve: The mitral valve is normal in structure. Mild mitral valve regurgitation. No evidence of mitral valve stenosis. MV peak gradient, 5.7 mmHg. The mean mitral valve gradient is 3.0 mmHg. Tricuspid Valve: The tricuspid valve is normal in structure. Tricuspid valve regurgitation is mild . No evidence of tricuspid stenosis. Aortic Valve: The aortic valve is normal in structure. Aortic valve regurgitation is not visualized. Aortic valve sclerosis is present, with no evidence of aortic valve stenosis. Aortic valve mean gradient measures 6.4 mmHg. Aortic valve peak gradient measures 10.8 mmHg. Aortic valve area, by VTI measures 2.36 cm.  Pulmonic Valve: The pulmonic  valve was normal in structure. Pulmonic valve regurgitation is not visualized. No evidence of pulmonic stenosis. Aorta: The aortic root is normal in size and structure. Venous: The inferior vena cava is normal in size with greater than 50% respiratory variability, suggesting right atrial pressure of 3 mmHg. IAS/Shunts: No atrial level shunt detected by color flow Doppler.  LEFT VENTRICLE PLAX 2D LVIDd:         4.80 cm   Diastology LVIDs:         3.30 cm   LV e' medial:    8.05 cm/s LV PW:         1.30 cm   LV E/e' medial:  10.3 LV IVS:        1.30 cm   LV e' lateral:   8.27 cm/s LVOT diam:     2.00 cm   LV E/e' lateral: 10.1 LV SV:         72 LV SV Index:   34 LVOT Area:     3.14 cm  RIGHT VENTRICLE RV Basal diam:  1.70 cm RV Mid diam:    2.20 cm LEFT ATRIUM           Index        RIGHT ATRIUM          Index LA diam:      3.80 cm 1.78 cm/m   RA Area:     9.70 cm LA Vol (A2C): 56.7 ml 26.53 ml/m  RA Volume:   15.10 ml 7.07 ml/m LA Vol (A4C): 33.7 ml 15.77 ml/m  AORTIC VALVE AV Area (Vmax):    2.24 cm AV Area (Vmean):   2.05 cm AV Area (VTI):     2.36 cm AV Vmax:           164.00 cm/s AV Vmean:          115.940 cm/s AV VTI:            0.306 m AV Peak Grad:      10.8 mmHg AV Mean Grad:      6.4 mmHg LVOT Vmax:         117.00 cm/s LVOT Vmean:        75.800 cm/s LVOT VTI:          0.230 m LVOT/AV VTI ratio: 0.75  AORTA Ao Root diam: 3.70 cm MITRAL VALVE                TRICUSPID VALVE MV Area (PHT): 3.95 cm     TR Peak grad:   13.4 mmHg MV Area VTI:   2.42 cm     TR Vmax:        183.00 cm/s MV Peak grad:  5.7 mmHg MV Mean grad:  3.0 mmHg     SHUNTS MV Vmax:       1.19 m/s     Systemic VTI:  0.23 m MV Vmean:      81.3 cm/s    Systemic Diam: 2.00 cm MV Decel Time: 192 msec MV E velocity: 83.30 cm/s MV A velocity: 110.00 cm/s MV E/A ratio:  0.76 Marcina Millard MD Electronically signed by Marcina Millard MD Signature Date/Time: 08/23/2023/1:31:08 PM    Final    DG C-Arm 1-60 Min-No Report  Result Date:  08/18/2023 Fluoroscopy was utilized by the requesting physician.  No radiographic interpretation.    Microbiology: Recent Results (from the past 240 hour(s))  MRSA Next Gen by PCR, Nasal  Status: None   Collection Time: 08/18/23  8:43 PM   Specimen: Nasal Mucosa; Nasal Swab  Result Value Ref Range Status   MRSA by PCR Next Gen NOT DETECTED NOT DETECTED Final    Comment: (NOTE) The GeneXpert MRSA Assay (FDA approved for NASAL specimens only), is one component of a comprehensive MRSA colonization surveillance program. It is not intended to diagnose MRSA infection nor to guide or monitor treatment for MRSA infections. Test performance is not FDA approved in patients less than 71 years old. Performed at [facility] [address] [place], [place] [address]   Blood culture (single)     Status: None (Preliminary result)   Collection Time: 08/26/23  7:30 PM   Specimen: BLOOD  Result Value Ref Range Status   Specimen Description BLOOD BLOOD RIGHT ARM  Final   Special Requests   Final    BOTTLES DRAWN AEROBIC AND ANAEROBIC Blood Culture results may not be optimal due to an inadequate volume of blood received in culture bottles   Culture   Final    NO GROWTH 2 DAYS Performed at [facility], [address] [place], [place] [address]    Report Status PENDING  Incomplete  Blood culture (single)     Status: None (Preliminary result)   Collection Time: 08/27/23 12:29 AM   Specimen: BLOOD  Result Value Ref Range Status   Specimen Description BLOOD BLOOD RIGHT ARM  Final   Special Requests   Final    BOTTLES DRAWN AEROBIC AND ANAEROBIC Blood Culture adequate volume   Culture   Final    NO GROWTH 1 DAY Performed at [facility], [address], [place], [place] [address]    Report Status PENDING  Incomplete  MRSA Next Gen by PCR, Nasal     Status: None   Collection Time: 08/27/23 10:56 AM   Specimen: Nasal Mucosa; Nasal Swab  Result Value Ref Range  Status   MRSA by PCR Next Gen NOT DETECTED NOT DETECTED Final    Comment: (NOTE) The GeneXpert MRSA Assay (FDA approved for NASAL specimens only), is one component of a comprehensive MRSA colonization surveillance program. It is not intended to diagnose MRSA infection nor to guide or monitor treatment for MRSA infections. Test performance is not FDA approved in patients less than 101 years old. Performed at [facility], [address] [place], [place] [address]   Aerobic/Anaerobic Culture w Gram Stain (surgical/deep wound)     Status: None (Preliminary result)   Collection Time: 08/27/23 10:56 AM   Specimen: Wound  Result Value Ref Range Status   Specimen Description   Final    WOUND Performed at [facility], [address], [place], [place] [address]    Special Requests   Final    RIGHT GROIN Performed at [facility], [address] [place], [place] [address]    Gram Stain   Final    FEW WBC PRESENT, PREDOMINANTLY PMN FEW GRAM POSITIVE COCCI IN PAIRS FEW GRAM POSITIVE RODS Performed at [facility], [address] [place], [place] [address]    Culture PENDING  Incomplete   Report Status PENDING  Incomplete     Labs: Basic Metabolic Panel: Recent Labs  Lab 08/26/23 1930 08/28/23 0524  NA 134* 137  K 3.5 3.9  CL 96* 104  CO2 24 25  GLUCOSE 124* 135*  BUN 16 9  CREATININE 1.11 0.92  CALCIUM 8.9 8.5*   Liver Function  Tests: Recent Labs  Lab 08/26/23 1930  AST 28  ALT 39  ALKPHOS 44  BILITOT 0.7  PROT 6.7  ALBUMIN 3.8   No results for input(s): "LIPASE", "AMYLASE" in the last 168 hours. No results for input(s): "AMMONIA" in the last 168 hours. CBC: Recent Labs  Lab 08/26/23 1930 08/28/23 0524  WBC 13.6* 13.1*  NEUTROABS 8.6*  --   HGB 10.7* 9.8*  HCT 32.7* 28.4*  MCV 86.1 84.0  PLT 423* 333   Cardiac Enzymes: No results for input(s): "CKTOTAL", "CKMB", "CKMBINDEX", "TROPONINI" in the last 168  hours. BNP: BNP (last 3 results) No results for input(s): "BNP" in the last 8760 hours.  ProBNP (last 3 results) No results for input(s): "PROBNP" in the last 8760 hours.  CBG: Recent Labs  Lab 08/27/23 1142 08/27/23 1211 08/27/23 1632 08/27/23 2005 08/28/23 0720  GLUCAP 147* 132* 146* 166* 136*       Signed:  Silvano Bilis MD.  [facility] 08/28/2023, 10:36 AM

## 2023-08-28 NOTE — Plan of Care (Signed)
Patient discharged per MD orders at this time.All dc instructions, education and medications reviewed with the patient.Pt expressed understanding and will comply with dc instructions.follow up appointments was also communicated to the Pt. no verbal c/o or any ssx of distress.Pt was discharged home with self-care per order.patient was transported home by daughter in a privately owned vehicle.

## 2023-08-28 NOTE — Progress Notes (Signed)
   Subjective/Chief Complaint: Feeling better. Daughter at bedside. States he has improved. Pain well controlled.   Objective: Vital signs in last 24 hours: Temp:  [97.8 F (36.6 C)-98.9 F (37.2 C)] 98.9 F (37.2 C) (09/28 0723) Pulse Rate:  [80-96] 96 (09/28 0723) Resp:  [14-18] 14 (09/28 0723) BP: (121-152)/(60-110) 152/73 (09/28 0723) SpO2:  [95 %-98 %] 98 % (09/28 0723) Weight:  [99.2 kg-99.5 kg] 99.5 kg (09/28 0300) Last BM Date : 08/26/23  Intake/Output from previous day: 09/27 0701 - 09/28 0700 In: 350 [IV Piggyback:350] Out: 450 [Urine:450] Intake/Output this shift: No intake/output data recorded.  General appearance: alert and no distress Resp: clear to auscultation bilaterally Cardio: regular rate and rhythm Extremities: RIGHT lower extremity- warm, erythema improved, thigh/calf soft, groin VAC in place, calf incisions- C/D/I, foot warm  Lab Results:  Recent Labs    08/26/23 1930 08/28/23 0524  WBC 13.6* 13.1*  HGB 10.7* 9.8*  HCT 32.7* 28.4*  PLT 423* 333   BMET Recent Labs    08/26/23 1930 08/28/23 0524  NA 134* 137  K 3.5 3.9  CL 96* 104  CO2 24 25  GLUCOSE 124* 135*  BUN 16 9  CREATININE 1.11 0.92  CALCIUM 8.9 8.5*   PT/INR No results for input(s): "LABPROT", "INR" in the last 72 hours. ABG No results for input(s): "PHART", "HCO3" in the last 72 hours.  Invalid input(s): "PCO2", "PO2"  Studies/Results: No results found.  Anti-infectives: Anti-infectives (From admission, onward)    Start     Dose/Rate Route Frequency Ordered Stop   08/28/23 0100  vancomycin (VANCOREADY) IVPB 1250 mg/250 mL        1,250 mg 166.7 mL/hr over 90 Minutes Intravenous Every 24 hours 08/27/23 0223     08/27/23 0900  cefTRIAXone (ROCEPHIN) 2 g in sodium chloride 0.9 % 100 mL IVPB        2 g 200 mL/hr over 30 Minutes Intravenous Every 24 hours 08/27/23 0831 09/03/23 0859   08/27/23 0045  vancomycin (VANCOCIN) IVPB 1000 mg/200 mL premix       Placed in  "Followed by" Linked Group   1,000 mg 200 mL/hr over 60 Minutes Intravenous  Once 08/27/23 0044 08/27/23 0221   08/27/23 0045  vancomycin (VANCOREADY) IVPB 1500 mg/300 mL       Placed in "Followed by" Linked Group   1,500 mg 150 mL/hr over 120 Minutes Intravenous  Once 08/27/23 0044 08/27/23 0428   08/27/23 0015  piperacillin-tazobactam (ZOSYN) IVPB 3.375 g        3.375 g 100 mL/hr over 30 Minutes Intravenous  Once 08/27/23 0013 08/27/23 0100       Assessment/Plan: RIGHT lower extremity wound infection s/p Fem-tib bypass VAC to right groin IV ABX- transition to PO OK from Vascular standpoint to d/c home later today, follow up next week in office  LOS: 1 day    Eli Hose A 08/28/2023

## 2023-08-29 ENCOUNTER — Telehealth: Payer: Self-pay | Admitting: Obstetrics and Gynecology

## 2023-08-29 MED ORDER — CEFPODOXIME PROXETIL 200 MG PO TABS: 400.0000 mg | ORAL_TABLET | Freq: Two times a day (BID) | ORAL | 0 refills | Status: AC

## 2023-08-29 NOTE — Telephone Encounter (Signed)
Culture results available looks like the klebsiella is resistant to amoxicillin but sensitive to third gen cephalosporins so will substitute cefpodoxime for amoxicillin. Called patient, he's doing well, informed him of the change, f/u pcp later this week and vascular surgery 1 week.

## 2023-08-30 LAB — SURGICAL PATHOLOGY

## 2023-08-31 LAB — CULTURE, BLOOD (SINGLE): Culture: NO GROWTH

## 2023-09-01 LAB — CULTURE, BLOOD (SINGLE)
Culture: NO GROWTH
Special Requests: ADEQUATE

## 2023-09-03 ENCOUNTER — Other Ambulatory Visit (INDEPENDENT_AMBULATORY_CARE_PROVIDER_SITE_OTHER): Payer: Self-pay | Admitting: Nurse Practitioner

## 2023-09-03 ENCOUNTER — Encounter (INDEPENDENT_AMBULATORY_CARE_PROVIDER_SITE_OTHER): Payer: Self-pay | Admitting: Nurse Practitioner

## 2023-09-03 ENCOUNTER — Ambulatory Visit (INDEPENDENT_AMBULATORY_CARE_PROVIDER_SITE_OTHER): Payer: Medicare PPO | Admitting: Nurse Practitioner

## 2023-09-03 VITALS — BP 140/81 | HR 91 | Resp 16

## 2023-09-03 DIAGNOSIS — I70221 Atherosclerosis of native arteries of extremities with rest pain, right leg: Secondary | ICD-10-CM

## 2023-09-03 LAB — AEROBIC/ANAEROBIC CULTURE W GRAM STAIN (SURGICAL/DEEP WOUND)

## 2023-09-06 ENCOUNTER — Other Ambulatory Visit (INDEPENDENT_AMBULATORY_CARE_PROVIDER_SITE_OTHER): Payer: Self-pay | Admitting: Vascular Surgery

## 2023-09-06 DIAGNOSIS — Z9889 Other specified postprocedural states: Secondary | ICD-10-CM

## 2023-09-07 ENCOUNTER — Telehealth (INDEPENDENT_AMBULATORY_CARE_PROVIDER_SITE_OTHER): Payer: Self-pay

## 2023-09-07 ENCOUNTER — Encounter (INDEPENDENT_AMBULATORY_CARE_PROVIDER_SITE_OTHER): Payer: Self-pay | Admitting: Nurse Practitioner

## 2023-09-07 LAB — AEROBIC CULTURE

## 2023-09-07 NOTE — Progress Notes (Signed)
Subjective:    Patient ID: Juan Watson, male    DOB: 1951/01/08, 72 y.o.   MRN: [id number] Chief Complaint  Patient presents with   Routine Post Op    [facility] 1 week follow up    The patient returns to the office for followup and review status post intervention on 08/08/2023  Procedure: PROCEDURE: Right femoral artery to peroneal artery bypass with in-situ saphenous vein graft Separate and distinct right profunda femoris artery endarterectomy and patch angioplasty with bovine pericardium Right lower extremity angiogram Exploration of right anterior tibial artery Endarterectomy of the right popliteal artery and anterior tibial arteries Incisional (disposable) VAC placement on the femoral incision   The patient recently presented to the emergency room due to concern for infection and cellulitis of the wound.  Today the wound appears better infection wise however there is a large area of slough in the right groin area.  The distal portion has evidence of a small blister near the incision but it is doing fairly well.    Review of Systems  Cardiovascular:  Positive for leg swelling.  Skin:  Positive for wound.  All other systems reviewed and are negative.      Objective:   Physical Exam Vitals reviewed.  HENT:     Head: Normocephalic.  Cardiovascular:     Rate and Rhythm: Normal rate.  Pulmonary:     Effort: Pulmonary effort is normal.  Musculoskeletal:     Right lower leg: No edema.  Skin:    General: Skin is warm and dry.  Neurological:     Mental Status: He is alert and oriented to person, place, and time.  Psychiatric:        Mood and Affect: Mood normal.        Behavior: Behavior normal.        Thought Content: Thought content normal.        Judgment: Judgment normal.     BP (!) 140/81 (BP Location: Right Arm)   Pulse 91   Resp 16   Past Medical History:  Diagnosis Date   Acquired flat foot 11/15/2015   Arthritis of knee, degenerative 04/15/2015   BPH  (benign prostatic hyperplasia)    Chewing tobacco nicotine dependence without complication 08/10/2020   Cholecystitis 06/28/2017   Coronary artery disease    Diabetes mellitus without complication (HCC)    Diabetes mellitus, type 2 (HCC) 12/13/2002   Overview:  DIET CONTROLLED    Disease of nail 07/16/2011   Encounter for screening for malignant neoplasm of prostate 10/29/2014   Essential (primary) hypertension 10/17/2010   Familial aortic aneurysm 03/26/2014   Family history of cardiovascular disease 03/26/2014   Gastro-esophageal reflux disease without esophagitis 08/11/2013   Generalized OA 08/11/2013   Gout 03/26/2014   Heart murmur    HOH (hard of hearing)    aids   Motion sickness    "sea sick"   Peripheral vascular disease (HCC)    Primary osteoarthritis of right knee 04/14/2015   Pure hypercholesterolemia 12/13/2002   Skin lesion 03/11/2015    Social History   Socioeconomic History   Marital status: Married    Spouse name: Bonita Quin    Number of children: 2   Years of education: Not on file   Highest education level: Not on file  Occupational History   Occupation: Retired   Tobacco Use   Smoking status: Former    Current packs/day: 0.00    Average packs/day: 2.0 packs/day for 25.0 years (50.0  ttl pk-yrs)    Types: Cigarettes    Start date: 07/16/1967    Quit date: 07/15/1992    Years since quitting: 31.1   Smokeless tobacco: Current    Types: Chew  Vaping Use   Vaping status: Never Used  Substance and Sexual Activity   Alcohol use: No   Drug use: No   Sexual activity: Not Currently  Other Topics Concern   Not on file  Social History Narrative   Lives at home with wife private residence   Social Determinants of Health   Financial Resource Strain: Low Risk  (02/23/2023)   Received [facility], [facility]   Overall Financial Resource Strain (CARDIA)    Difficulty of Paying Living Expenses: Not hard at all  Food  Insecurity: No Food Insecurity (08/27/2023)   Hunger Vital Sign    Worried About Running Out of Food in the Last Year: Never true    Ran Out of Food in the Last Year: Never true  Transportation Needs: No Transportation Needs (08/27/2023)   PRAPARE - Administrator, Civil Service (Medical): No    Lack of Transportation (Non-Medical): No  Physical Activity: Not on file  Stress: No Stress Concern Present (06/11/2020)   [employer] of Occupational Health - Occupational Stress Questionnaire    Feeling of Stress : Not at all  Social Connections: Unknown (06/11/2020)   Social Connection and Isolation Panel [NHANES]    Frequency of Communication with Friends and Family: More than three times a week    Frequency of Social Gatherings with Friends and Family: More than three times a week    Attends Religious Services: Not on file    Active Member of Clubs or Organizations: Not on file    Attends Banker Meetings: Not on file    Marital Status: Married  Intimate Partner Violence: Not At Risk (08/27/2023)   Humiliation, Afraid, Rape, and Kick questionnaire    Fear of Current or Ex-Partner: No    Emotionally Abused: No    Physically Abused: No    Sexually Abused: No    Past Surgical History:  Procedure Laterality Date   BACK SURGERY  77,87   CARDIAC CATHETERIZATION  70's   pericarditis   CATARACT EXTRACTION W/PHACO Right 03/23/2016   Procedure: CATARACT EXTRACTION PHACO AND INTRAOCULAR LENS PLACEMENT (IOC);  Surgeon: Sallee Lange, MD;  Location: [facility];  Service: Ophthalmology;  Laterality: Right;  [place] 01:31    CATARACT EXTRACTION W/PHACO Left 10/23/2019   Procedure: CATARACT EXTRACTION PHACO AND INTRAOCULAR LENS PLACEMENT (IOC) LEFT DIABETIC TORIC LENS 5.82,   00:44.1;  Surgeon: Nevada Crane, MD;  Location: [facility];  Service: Ophthalmology;  Laterality: Left;  Diabetic - oral meds   CHOLECYSTECTOMY N/A 06/29/2017   Procedure: LAPAROSCOPIC  CHOLECYSTECTOMY;  Surgeon: Henrene Dodge, MD;  Location: [facility];  Service: General;  Laterality: N/A;   COLONOSCOPY WITH PROPOFOL N/A 10/06/2017   Procedure: COLONOSCOPY WITH PROPOFOL;  Surgeon: Scot Jun, MD;  Location[facility];  Service: Endoscopy;  Laterality: N/A;   CORONARY STENT INTERVENTION N/A 04/11/2020   Procedure: CORONARY STENT INTERVENTION;  Surgeon: Alwyn Pea, MD;  Location: [facility];  Service: Cardiovascular;  Laterality: N/A;   ENDARTERECTOMY FEMORAL Right 08/18/2023   Procedure: ENDARTERECTOMY FEMORAL;  Surgeon: Renford Dills, MD;  Location: [facility];  Service: Vascular;  Laterality: Right;   ENDARTERECTOMY POPLITEAL Right 08/18/2023   Procedure: ENDARTERECTOMY POPLITEAL;  Surgeon:  Schnier, Latina Craver, MD;  Location: [facility];  Service: Vascular;  Laterality: Right;   ENDARTERECTOMY TIBIOPERONEAL Right 08/18/2023   Procedure: ENDARTERECTOMY TIBIOPERONEAL;  Surgeon: Renford Dills, MD;  Location: [facility];  Service: Vascular;  Laterality: Right;   FEMORAL-TIBIAL BYPASS GRAFT Right 08/18/2023   Procedure: BYPASS GRAFT FEMORAL-TIBIAL ARTERY (WITH VEIN);  Surgeon: Renford Dills, MD;  Location: [facility];  Service: Vascular;  Laterality: Right;   HERNIA REPAIR  11,   umbilical x2   KNEE ARTHROSCOPY Right 12/20/2017   Procedure: ARTHROSCOPY KNEE EXTENSIVE SYNOVECTOMY AND LYSIS OF ADHESIONS;  Surgeon: Donato Heinz, MD;  Location: [facility];  Service: Orthopedics;  Laterality: Right;   KNEE DEBRIDEMENT Left    tendon   LEFT HEART CATH AND CORONARY ANGIOGRAPHY Left 04/11/2020   Procedure: LEFT HEART CATH AND CORONARY ANGIOGRAPHY;  Surgeon: Lamar Blinks, MD;  Location: [facility];  Service: Cardiovascular;  Laterality: Left;   LOWER EXTREMITY ANGIOGRAPHY Left 09/24/2020   Procedure: LOWER EXTREMITY ANGIOGRAPHY;  Surgeon: Renford Dills, MD;  Location: [facility];  Service: Cardiovascular;  Laterality: Left;   LOWER  EXTREMITY ANGIOGRAPHY Right 07/13/2023   Procedure: Lower Extremity Angiography;  Surgeon: Renford Dills, MD;  Location: [facility];  Service: Cardiovascular;  Laterality: Right;   TONSILLECTOMY  72   TOTAL KNEE ARTHROPLASTY Right 04/15/2015   Procedure: TOTAL KNEE ARTHROPLASTY;  Surgeon: Gean Birchwood, MD;  Location: [facility];  Service: Orthopedics;  Laterality: Right;   TRANSURETHRAL RESECTION OF PROSTATE  ?    Family History  Problem Relation Age of Onset   Multiple myeloma Mother    Aneurysm Father    Aneurysm Brother    Nephrolithiasis Neg Hx    Bladder Cancer Neg Hx    Prostate cancer Neg Hx    Kidney cancer Neg Hx     Allergies  Allergen Reactions   Bee Venom Shortness Of Breath and Swelling   Sulfa Antibiotics Swelling    Tongue swelling   Ben Gay Ultra [Menthol] Other (See Comments)    Whelts and redness of skin       Latest Ref Rng & Units 08/28/2023    5:24 AM 08/26/2023    7:30 PM 08/21/2023   10:07 AM  CBC  WBC 4.0 - 10.5 K/uL 13.1  13.6  14.3   Hemoglobin 13.0 - 17.0 g/dL 9.8  09.8  11.9   Hematocrit 39.0 - 52.0 % 28.4  32.7  29.8   Platelets 150 - 400 K/uL 333  423  220       CMP     Component Value Date/Time   NA 137 08/28/2023 0524   K 3.9 08/28/2023 0524   CL 104 08/28/2023 0524   CO2 25 08/28/2023 0524   GLUCOSE 135 (H) 08/28/2023 0524   BUN 9 08/28/2023 0524   CREATININE 0.92 08/28/2023 0524   CALCIUM 8.5 (L) 08/28/2023 0524   PROT 6.7 08/26/2023 1930   ALBUMIN 3.8 08/26/2023 1930   AST 28 08/26/2023 1930   ALT 39 08/26/2023 1930   ALKPHOS 44 08/26/2023 1930   BILITOT 0.7 08/26/2023 1930   GFR 67.55 12/02/2020 0812   GFRNONAA >60 08/28/2023 0524     No results found.     Assessment & Plan:   1. Atherosclerosis of native artery of right leg with rest pain (HCC) Today we have cultured the wound today today we have cultured the wound.  We have left the staples in  the distal portion of his leg intact due to some continued  swelling but staples removed in his groin area.  He has a small area of dehiscence.  We will utilize Medihoney in this area to be changed daily.  He will follow-up in 1 week for wound reevaluation and staple removal.   Current Outpatient Medications on File Prior to Visit  Medication Sig Dispense Refill   acetaminophen (TYLENOL) 500 MG tablet Take 1,000 mg by mouth every 6 (six) hours as needed for mild pain or headache.     ascorbic acid (VITAMIN C) 500 MG tablet Take 500 mg by mouth daily with supper.     aspirin EC 81 MG tablet Take 81 mg by mouth at bedtime.     cholecalciferol (VITAMIN D3) 25 MCG (1000 UNIT) tablet Take 1,000 Units by mouth daily with supper.     clopidogrel (PLAVIX) 75 MG tablet Take 1 tablet (75 mg total) by mouth daily at 6 (six) AM. 30 tablet 11   Coenzyme Q10 (CO Q-10) 100 MG CAPS Take 200 mg by mouth daily with supper.     doxycycline (VIBRAMYCIN) 100 MG capsule Take 1 capsule (100 mg total) by mouth 2 (two) times daily. 14 capsule 0   Febuxostat 80 MG TABS Take 80 mg by mouth daily with supper.     fluticasone (FLONASE) 50 MCG/ACT nasal spray Place 1 spray into both nostrils in the morning and at bedtime.     glucose blood test strip Use as instructed 100 each 12   levothyroxine (SYNTHROID) 75 MCG tablet Take 75 mcg by mouth daily before breakfast.     magnesium oxide (MAG-OX) 400 MG tablet Take 400 mg by mouth daily.     metFORMIN (GLUCOPHAGE) 500 MG tablet Take 2 tablets (1,000 mg total) by mouth 2 (two) times daily with a meal. (Patient taking differently: Take 500 mg by mouth 2 (two) times daily with a meal.) 360 tablet 3   MOUNJARO 7.5 MG/0.5ML Pen Inject 7.5 mg into the skin every Monday.     Omega-3 Fatty Acids (FISH OIL) 1200 MG CAPS Take 1,200 mg by mouth 2 (two) times daily.     oxyCODONE-acetaminophen (PERCOCET/ROXICET) 5-325 MG tablet Take 1-2 tablets by mouth every 4 (four) hours as needed for moderate pain. 30 tablet 0   pantoprazole (PROTONIX) 40 MG  tablet Take 40 mg by mouth daily.     pregabalin (LYRICA) 50 MG capsule Take 50 mg by mouth daily.     rosuvastatin (CRESTOR) 10 MG tablet Take 1 tablet (10 mg total) by mouth daily. (Patient taking differently: Take 10 mg by mouth daily with supper.) 90 tablet 3   telmisartan (MICARDIS) 80 MG tablet Take 80 mg by mouth daily with supper.  (Patient not taking: Reported on 08/27/2023)     No current facility-administered medications on file prior to visit.    There are no Patient Instructions on file for this visit. No follow-ups on file.   Georgiana Spinner, NP

## 2023-09-07 NOTE — Telephone Encounter (Signed)
Error

## 2023-09-09 ENCOUNTER — Encounter (INDEPENDENT_AMBULATORY_CARE_PROVIDER_SITE_OTHER): Payer: Self-pay | Admitting: Nurse Practitioner

## 2023-09-09 ENCOUNTER — Ambulatory Visit (INDEPENDENT_AMBULATORY_CARE_PROVIDER_SITE_OTHER): Payer: Medicare PPO | Admitting: Vascular Surgery

## 2023-09-09 ENCOUNTER — Ambulatory Visit (INDEPENDENT_AMBULATORY_CARE_PROVIDER_SITE_OTHER): Payer: Medicare PPO

## 2023-09-09 ENCOUNTER — Ambulatory Visit (INDEPENDENT_AMBULATORY_CARE_PROVIDER_SITE_OTHER): Payer: Medicare PPO | Admitting: Nurse Practitioner

## 2023-09-09 VITALS — BP 149/81 | HR 96 | Resp 18 | Ht 67.0 in | Wt 219.0 lb

## 2023-09-09 DIAGNOSIS — Z9889 Other specified postprocedural states: Secondary | ICD-10-CM

## 2023-09-09 DIAGNOSIS — I739 Peripheral vascular disease, unspecified: Secondary | ICD-10-CM

## 2023-09-13 NOTE — Progress Notes (Unsigned)
Subjective:    Patient ID: Juan Watson, male    DOB: 01-04-1951, 72 y.o.   MRN: [id number] Chief Complaint  Patient presents with  . Follow-up    Follow up with Schnier, Latina Craver, MD (Vascular Surgery) in 2 weeks (09/06/2023); Post Op Follow up for staple removal and U/S right lower extremity with ABI    The patient returns to the office for followup and review status post intervention on 08/08/2023   Procedure: PROCEDURE: Right femoral artery to peroneal artery bypass with in-situ saphenous vein graft Separate and distinct right profunda femoris artery endarterectomy and patch angioplasty with bovine pericardium Right lower extremity angiogram Exploration of right anterior tibial artery Endarterectomy of the right popliteal artery and anterior tibial arteries Incisional (disposable) VAC placement on the femoral incision  Staples removed in the groin area and today it is, with some fibrinous exudate but no evidence of infection.  A culture was previously done which showed no evidence of bacteria just normal skin flora.  The swelling in the lower extremity is much improved following the initiation of diuretics by his primary care provider.  The patient underwent noninvasive studies today which show an ABI 0.81 in the right compared to 0.53.  The patient has biphasic waveforms in the tibial vessels which are hyperemic today.  The left lower extremity is 1.02 compared to 0.87.   Review of Systems  Cardiovascular:  Positive for leg swelling.  Skin:  Positive for wound.  All other systems reviewed and are negative.      Objective:   Physical Exam Vitals reviewed.  HENT:     Head: Normocephalic.  Cardiovascular:     Rate and Rhythm: Normal rate.     Pulses: Normal pulses.  Pulmonary:     Effort: Pulmonary effort is normal.  Musculoskeletal:     Right lower leg: Edema present.  Skin:    General: Skin is warm and dry.  Neurological:     Mental Status: He is alert and oriented  to person, place, and time.  Psychiatric:        Mood and Affect: Mood normal.        Behavior: Behavior normal.        Thought Content: Thought content normal.        Judgment: Judgment normal.    BP (!) 149/81 (BP Location: Right Arm)   Pulse 96   Resp 18   Ht 5\' 7"  (1.702 m)   Wt 219 lb (99.3 kg)   BMI 34.30 kg/m   Past Medical History:  Diagnosis Date  . Acquired flat foot 11/15/2015  . Arthritis of knee, degenerative 04/15/2015  . BPH (benign prostatic hyperplasia)   . Chewing tobacco nicotine dependence without complication 08/10/2020  . Cholecystitis 06/28/2017  . Coronary artery disease   . Diabetes mellitus without complication (HCC)   . Diabetes mellitus, type 2 (HCC) 12/13/2002   Overview:  DIET CONTROLLED   . Disease of nail 07/16/2011  . Encounter for screening for malignant neoplasm of prostate 10/29/2014  . Essential (primary) hypertension 10/17/2010  . Familial aortic aneurysm 03/26/2014  . Family history of cardiovascular disease 03/26/2014  . Gastro-esophageal reflux disease without esophagitis 08/11/2013  . Generalized OA 08/11/2013  . Gout 03/26/2014  . Heart murmur   . HOH (hard of hearing)    aids  . Motion sickness    "sea sick"  . Peripheral vascular disease (HCC)   . Primary osteoarthritis of right knee 04/14/2015  . Pure  hypercholesterolemia 12/13/2002  . Skin lesion 03/11/2015    Social History   Socioeconomic History  . Marital status: Married    Spouse name: Bonita Quin   . Number of children: 2  . Years of education: Not on file  . Highest education level: Not on file  Occupational History  . Occupation: Retired   Tobacco Use  . Smoking status: Former    Current packs/day: 0.00    Average packs/day: 2.0 packs/day for 25.0 years (50.0 ttl pk-yrs)    Types: Cigarettes    Start date: 07/16/1967    Quit date: 07/15/1992    Years since quitting: 31.1  . Smokeless tobacco: Current    Types: Chew  Vaping Use  . Vaping status: Never  Used  Substance and Sexual Activity  . Alcohol use: No  . Drug use: No  . Sexual activity: Not Currently  Other Topics Concern  . Not on file  Social History Narrative   Lives at home with wife private residence   Social Determinants of Health   Financial Resource Strain: Low Risk  (09/08/2023)   Received [facility]   Overall Financial Resource Strain (CARDIA)   . Difficulty of Paying Living Expenses: Not hard at all  Food Insecurity: No Food Insecurity (09/08/2023)   Received from [facility]   Hunger Vital Sign   . Worried About Programme researcher, broadcasting/film/video in the Last Year: Never true   . Ran Out of Food in the Last Year: Never true  Transportation Needs: No Transportation Needs (09/08/2023)   Received from [facility]   [facility] - Transportation   . In the past 12 months, has lack of transportation kept you from medical appointments or from getting medications?: No   . Lack of Transportation (Non-Medical): No  Physical Activity: Not on file  Stress: No Stress Concern Present (06/11/2020)   [employer] of Occupational Health - Occupational Stress Questionnaire   . Feeling of Stress : Not at all  Social Connections: Unknown (06/11/2020)   Social Connection and Isolation Panel [NHANES]   . Frequency of Communication with Friends and Family: More than three times a week   . Frequency of Social Gatherings with Friends and Family: More than three times a week   . Attends Religious Services: Not on file   . Active Member of Clubs or Organizations: Not on file   . Attends Banker Meetings: Not on file   . Marital Status: Married  Catering manager Violence: Not At Risk (08/27/2023)   Humiliation, Afraid, Rape, and Kick questionnaire   . Fear of Current or Ex-Partner: No   . Emotionally Abused: No   . Physically Abused: No   . Sexually Abused: No    Past Surgical History:  Procedure Laterality Date  . BACK  SURGERY  [phone]  . CARDIAC CATHETERIZATION  70's   pericarditis  . CATARACT EXTRACTION W/PHACO Right 03/23/2016   Procedure: CATARACT EXTRACTION PHACO AND INTRAOCULAR LENS PLACEMENT (IOC);  Surgeon: Sallee Lange, MD;  Location: [facility];  Service: Ophthalmology;  Laterality: Right;  [place] 01:31   . CATARACT EXTRACTION W/PHACO Left 10/23/2019   Procedure: CATARACT EXTRACTION PHACO AND INTRAOCULAR LENS PLACEMENT (IOC) LEFT DIABETIC TORIC LENS 5.82,   00:44.1;  Surgeon: Nevada Crane, MD;  Location: [facility];  Service: Ophthalmology;  Laterality: Left;  Diabetic - oral meds  . CHOLECYSTECTOMY N/A 06/29/2017   Procedure: LAPAROSCOPIC CHOLECYSTECTOMY;  Surgeon: Henrene Dodge, MD;  Location: [facility];  Service: General;  Laterality: N/A;  . COLONOSCOPY WITH PROPOFOL N/A 10/06/2017   Procedure: COLONOSCOPY WITH PROPOFOL;  Surgeon: Scot Jun, MD;  Location: [facility];  Service: Endoscopy;  Laterality: N/A;  . CORONARY STENT INTERVENTION N/A 04/11/2020   Procedure: CORONARY STENT INTERVENTION;  Surgeon: Alwyn Pea, MD;  Location: [facility];  Service: Cardiovascular;  Laterality: N/A;  . ENDARTERECTOMY FEMORAL Right 08/18/2023   Procedure: ENDARTERECTOMY FEMORAL;  Surgeon: Renford Dills, MD;  Location: [facility];  Service: Vascular;  Laterality: Right;  . ENDARTERECTOMY POPLITEAL Right 08/18/2023   Procedure: ENDARTERECTOMY POPLITEAL;  Surgeon: Renford Dills, MD;  Location: [facility];  Service: Vascular;  Laterality: Right;  . ENDARTERECTOMY TIBIOPERONEAL Right 08/18/2023   Procedure: ENDARTERECTOMY TIBIOPERONEAL;  Surgeon: Renford Dills, MD;  Location: [facility];  Service: Vascular;  Laterality: Right;  . FEMORAL-TIBIAL BYPASS GRAFT Right 08/18/2023   Procedure: BYPASS GRAFT FEMORAL-TIBIAL ARTERY (WITH VEIN);  Surgeon: Renford Dills, MD;  Location: [facility];  Service: Vascular;  Laterality: Right;  . HERNIA REPAIR  11,   umbilical x2  . KNEE  ARTHROSCOPY Right 12/20/2017   Procedure: ARTHROSCOPY KNEE EXTENSIVE SYNOVECTOMY AND LYSIS OF ADHESIONS;  Surgeon: Donato Heinz, MD;  Location: [facility];  Service: Orthopedics;  Laterality: Right;  . KNEE DEBRIDEMENT Left    tendon  . LEFT HEART CATH AND CORONARY ANGIOGRAPHY Left 04/11/2020   Procedure: LEFT HEART CATH AND CORONARY ANGIOGRAPHY;  Surgeon: Lamar Blinks, MD;  Location: [facility];  Service: Cardiovascular;  Laterality: Left;  . LOWER EXTREMITY ANGIOGRAPHY Left 09/24/2020   Procedure: LOWER EXTREMITY ANGIOGRAPHY;  Surgeon: Renford Dills, MD;  Location: [facility];  Service: Cardiovascular;  Laterality: Left;  . LOWER EXTREMITY ANGIOGRAPHY Right 07/13/2023   Procedure: Lower Extremity Angiography;  Surgeon: Renford Dills, MD;  [facility];  Service: Cardiovascular;  Laterality: Right;  . TONSILLECTOMY  72  . TOTAL KNEE ARTHROPLASTY Right 04/15/2015   Procedure: TOTAL KNEE ARTHROPLASTY;  Surgeon: Gean Birchwood, MD;  Location: [facility];  Service: Orthopedics;  Laterality: Right;  . TRANSURETHRAL RESECTION OF PROSTATE  ?    Family History  Problem Relation Age of Onset  . Multiple myeloma Mother   . Aneurysm Father   . Aneurysm Brother   . Nephrolithiasis Neg Hx   . Bladder Cancer Neg Hx   . Prostate cancer Neg Hx   . Kidney cancer Neg Hx     Allergies  Allergen Reactions  . Bee Venom Shortness Of Breath and Swelling  . Sulfa Antibiotics Swelling    Tongue swelling  . Ben Gay Ultra [Menthol] Other (See Comments)    Whelts and redness of skin       Latest Ref Rng & Units 08/28/2023    5:24 AM 08/26/2023    7:30 PM 08/21/2023   10:07 AM  CBC  WBC 4.0 - 10.5 K/uL 13.1  13.6  14.3   Hemoglobin 13.0 - 17.0 g/dL 9.8  16.1  09.6   Hematocrit 39.0 - 52.0 % 28.4  32.7  29.8   Platelets 150 - 400 K/uL 333  423  220       CMP     Component Value Date/Time   NA 137 08/28/2023 0524   K 3.9 08/28/2023 0524   CL 104  08/28/2023 0524   CO2 25 08/28/2023 0524   GLUCOSE 135 (H) 08/28/2023 0524   BUN 9 08/28/2023  0524   CREATININE 0.92 08/28/2023 0524   CALCIUM 8.5 (L) 08/28/2023 0524   PROT 6.7 08/26/2023 1930   ALBUMIN 3.8 08/26/2023 1930   AST 28 08/26/2023 1930   ALT 39 08/26/2023 1930   ALKPHOS 44 08/26/2023 1930   BILITOT 0.7 08/26/2023 1930   GFR 67.55 12/02/2020 0812   GFRNONAA >60 08/28/2023 0524     No results found.     Assessment & Plan:   1. Peripheral arterial disease with history of revascularization (HCC) Today the remaining staples on the groin were removed.  Additionally every other staple and   Current Outpatient Medications on File Prior to Visit  Medication Sig Dispense Refill  . acetaminophen (TYLENOL) 500 MG tablet Take 1,000 mg by mouth every 6 (six) hours as needed for mild pain or headache.    Marland Kitchen ascorbic acid (VITAMIN C) 500 MG tablet Take 500 mg by mouth daily with supper.    Marland Kitchen aspirin EC 81 MG tablet Take 81 mg by mouth at bedtime.    . cholecalciferol (VITAMIN D3) 25 MCG (1000 UNIT) tablet Take 1,000 Units by mouth daily with supper.    . clopidogrel (PLAVIX) 75 MG tablet Take 1 tablet (75 mg total) by mouth daily at 6 (six) AM. 30 tablet 11  . Coenzyme Q10 (CO Q-10) 100 MG CAPS Take 200 mg by mouth daily with supper.    . doxycycline (VIBRAMYCIN) 100 MG capsule Take 1 capsule (100 mg total) by mouth 2 (two) times daily. 14 capsule 0  . Febuxostat 80 MG TABS Take 80 mg by mouth daily with supper.    . fluticasone (FLONASE) 50 MCG/ACT nasal spray Place 1 spray into both nostrils in the morning and at bedtime.    Marland Kitchen glucose blood test strip Use as instructed 100 each 12  . levothyroxine (SYNTHROID) 75 MCG tablet Take 75 mcg by mouth daily before breakfast.    . magnesium oxide (MAG-OX) 400 MG tablet Take 400 mg by mouth daily.    . metFORMIN (GLUCOPHAGE) 500 MG tablet Take 2 tablets (1,000 mg total) by mouth 2 (two) times daily with a meal. (Patient taking  differently: Take 500 mg by mouth 2 (two) times daily with a meal.) 360 tablet 3  . MOUNJARO 7.5 MG/0.5ML Pen Inject 7.5 mg into the skin every Monday.    . Omega-3 Fatty Acids (FISH OIL) 1200 MG CAPS Take 1,200 mg by mouth 2 (two) times daily.    Marland Kitchen oxyCODONE-acetaminophen (PERCOCET/ROXICET) 5-325 MG tablet Take 1-2 tablets by mouth every 4 (four) hours as needed for moderate pain. 30 tablet 0  . pantoprazole (PROTONIX) 40 MG tablet Take 40 mg by mouth daily.    . pregabalin (LYRICA) 50 MG capsule Take 50 mg by mouth daily.    . rosuvastatin (CRESTOR) 10 MG tablet Take 1 tablet (10 mg total) by mouth daily. (Patient taking differently: Take 10 mg by mouth daily with supper.) 90 tablet 3  . telmisartan (MICARDIS) 80 MG tablet Take 80 mg by mouth daily with supper.  (Patient not taking: Reported on 08/27/2023)     No current facility-administered medications on file prior to visit.    There are no Patient Instructions on file for this visit. No follow-ups on file.   Georgiana Spinner, NP

## 2023-09-16 ENCOUNTER — Encounter (INDEPENDENT_AMBULATORY_CARE_PROVIDER_SITE_OTHER): Payer: Self-pay | Admitting: Nurse Practitioner

## 2023-09-16 ENCOUNTER — Ambulatory Visit (INDEPENDENT_AMBULATORY_CARE_PROVIDER_SITE_OTHER): Payer: Medicare PPO | Admitting: Nurse Practitioner

## 2023-09-16 VITALS — BP 127/78 | HR 94 | Resp 18 | Ht 67.0 in | Wt 219.0 lb

## 2023-09-16 DIAGNOSIS — T8130XA Disruption of wound, unspecified, initial encounter: Secondary | ICD-10-CM

## 2023-09-16 LAB — VAS US ABI WITH/WO TBI
Left ABI: 1.02
Right ABI: 0.81

## 2023-09-16 MED ORDER — DOXYCYCLINE HYCLATE 100 MG PO CAPS: 100.0000 mg | ORAL_CAPSULE | Freq: Two times a day (BID) | ORAL | 0 refills | Status: DC

## 2023-09-17 ENCOUNTER — Telehealth (INDEPENDENT_AMBULATORY_CARE_PROVIDER_SITE_OTHER): Payer: Self-pay

## 2023-09-17 NOTE — Telephone Encounter (Addendum)
Spoke with the patient and he will be scheduled for a right groin wound debridement and vac placement on 09/22/23 at the [facility]. Pre-op is on 09/21/23 a phone call between 8-1 pm. Pre-surgical instructions were discussed and will be sent to Mychart.

## 2023-09-20 ENCOUNTER — Telehealth (INDEPENDENT_AMBULATORY_CARE_PROVIDER_SITE_OTHER): Payer: Self-pay

## 2023-09-20 NOTE — Progress Notes (Signed)
Subjective:    Patient ID: Juan Watson, male    DOB: 1951/11/27, 72 y.o.   MRN: [id number] Chief Complaint  Patient presents with   Follow-up    Staple Removal    Today the patient returns for evaluation of his quad wound as well as his leg incisions.  The incisions on his legs are healing well as is the swelling greatly improved.  However his groin incisions not noted any significant improvement with use of Medihoney.  There are some concern by his home health nurse that the wound is becoming somewhat malodorous and may currently have an infection.  Today it certainly does appear that the fibrinous exudate that was present has not loosened but there is a slight odor present in the lung.  Today the groin incision measures approximately 5 x 2 x 1.  He currently follows a diabetic diet.    Review of Systems  Cardiovascular:  Positive for leg swelling.  Skin:  Positive for wound.  Neurological:  Positive for weakness.  All other systems reviewed and are negative.      Objective:   Physical Exam Vitals reviewed.  HENT:     Head: Normocephalic.  Cardiovascular:     Rate and Rhythm: Normal rate.     Pulses:          Dorsalis pedis pulses are 1+ on the right side.  Pulmonary:     Effort: Pulmonary effort is normal.  Skin:    General: Skin is warm and dry.  Neurological:     Mental Status: He is alert and oriented to person, place, and time.     Gait: Gait abnormal.  Psychiatric:        Mood and Affect: Mood normal.        Behavior: Behavior normal.        Thought Content: Thought content normal.        Judgment: Judgment normal.     BP 127/78 (BP Location: Left Arm)   Pulse 94   Resp 18   Ht 5\' 7"  (1.702 m)   Wt 219 lb (99.3 kg)   BMI 34.30 kg/m   Past Medical History:  Diagnosis Date   Acquired flat foot 11/15/2015   Arthritis of knee, degenerative 04/15/2015   BPH (benign prostatic hyperplasia)    Chewing tobacco nicotine dependence without complication  08/10/2020   Cholecystitis 06/28/2017   Coronary artery disease    Diabetes mellitus without complication (HCC)    Diabetes mellitus, type 2 (HCC) 12/13/2002   Overview:  DIET CONTROLLED    Disease of nail 07/16/2011   Encounter for screening for malignant neoplasm of prostate 10/29/2014   Essential (primary) hypertension 10/17/2010   Familial aortic aneurysm 03/26/2014   Family history of cardiovascular disease 03/26/2014   Gastro-esophageal reflux disease without esophagitis 08/11/2013   Generalized OA 08/11/2013   Gout 03/26/2014   Heart murmur    HOH (hard of hearing)    aids   Motion sickness    "sea sick"   Peripheral vascular disease (HCC)    Primary osteoarthritis of right knee 04/14/2015   Pure hypercholesterolemia 12/13/2002   Skin lesion 03/11/2015    Social History   Socioeconomic History   Marital status: Married    Spouse name: Bonita Quin    Number of children: 2   Years of education: Not on file   Highest education level: Not on file  Occupational History   Occupation: Retired   Tobacco Use   Smoking  status: Former    Current packs/day: 0.00    Average packs/day: 2.0 packs/day for 25.0 years (50.0 ttl pk-yrs)    Types: Cigarettes    Start date: 07/16/1967    Quit date: 07/15/1992    Years since quitting: 31.2   Smokeless tobacco: Current    Types: Chew  Vaping Use   Vaping status: Never Used  Substance and Sexual Activity   Alcohol use: No   Drug use: No   Sexual activity: Not Currently  Other Topics Concern   Not on file  Social History Narrative   Lives at home with wife private residence   Social Determinants of Health   Financial Resource Strain: Low Risk  (09/08/2023)   Received from [facility]   Overall Financial Resource Strain (CARDIA)    Difficulty of Paying Living Expenses: Not hard at all  Food Insecurity: No Food Insecurity (09/08/2023)   Received from [facility]   Hunger Vital Sign    Worried  About Running Out of Food in the Last Year: Never true    Ran Out of Food in the Last Year: Never true  Transportation Needs: No Transportation Needs (09/08/2023)   Received from [facility] - Transportation    In the past 12 months, has lack of transportation kept you from medical appointments or from getting medications?: No    Lack of Transportation (Non-Medical): No  Physical Activity: Not on file  Stress: No Stress Concern Present (06/11/2020)   [employer] of Occupational Health - Occupational Stress Questionnaire    Feeling of Stress : Not at all  Social Connections: Unknown (06/11/2020)   Social Connection and Isolation Panel [NHANES]    Frequency of Communication with Friends and Family: More than three times a week    Frequency of Social Gatherings with Friends and Family: More than three times a week    Attends Religious Services: Not on file    Active Member of Clubs or Organizations: Not on file    Attends Banker Meetings: Not on file    Marital Status: Married  Intimate Partner Violence: Not At Risk (08/27/2023)   Humiliation, Afraid, Rape, and Kick questionnaire    Fear of Current or Ex-Partner: No    Emotionally Abused: No    Physically Abused: No    Sexually Abused: No    Past Surgical History:  Procedure Laterality Date   BACK SURGERY  77,87   CARDIAC CATHETERIZATION  70's   pericarditis   CATARACT EXTRACTION W/PHACO Right 03/23/2016   Procedure: CATARACT EXTRACTION PHACO AND INTRAOCULAR LENS PLACEMENT (IOC);  Surgeon: Sallee Lange, MD;  Location: [facility];  Service: Ophthalmology;  Laterality: Right;  [place] 01:31    CATARACT EXTRACTION W/PHACO Left 10/23/2019   Procedure: CATARACT EXTRACTION PHACO AND INTRAOCULAR LENS PLACEMENT (IOC) LEFT DIABETIC TORIC LENS 5.82,   00:44.1;  Surgeon: Nevada Crane, MD;  Location[facility];  Service: Ophthalmology;  Laterality: Left;  Diabetic - oral meds    CHOLECYSTECTOMY N/A 06/29/2017   Procedure: LAPAROSCOPIC CHOLECYSTECTOMY;  Surgeon: Henrene Dodge, MD;  Location: [facility];  Service: General;  Laterality: N/A;   COLONOSCOPY WITH PROPOFOL N/A 10/06/2017   Procedure: COLONOSCOPY WITH PROPOFOL;  Surgeon: Scot Jun, MD;  Location: [facility];  Service: Endoscopy;  Laterality: N/A;   CORONARY STENT INTERVENTION N/A 04/11/2020   Procedure: CORONARY STENT INTERVENTION;  Surgeon: Alwyn Pea, MD;  Location: [facility];  Service: Cardiovascular;  Laterality: N/A;   ENDARTERECTOMY FEMORAL Right 08/18/2023   Procedure: ENDARTERECTOMY FEMORAL;  Surgeon: Renford Dills, MD;  Location: [facility];  Service: Vascular;  Laterality: Right;   ENDARTERECTOMY POPLITEAL Right 08/18/2023   Procedure: ENDARTERECTOMY POPLITEAL;  Surgeon: Renford Dills, MD;  Location: [facility];  Service: Vascular;  Laterality: Right;   ENDARTERECTOMY TIBIOPERONEAL Right 08/18/2023   Procedure: ENDARTERECTOMY TIBIOPERONEAL;  Surgeon: Renford Dills, MD;  Location: [facility];  Service: Vascular;  Laterality: Right;   FEMORAL-TIBIAL BYPASS GRAFT Right 08/18/2023   Procedure: BYPASS GRAFT FEMORAL-TIBIAL ARTERY (WITH VEIN);  Surgeon: Renford Dills, MD;  Location: [facility];  Service: Vascular;  Laterality: Right;   HERNIA REPAIR  11,   umbilical x2   KNEE ARTHROSCOPY Right 12/20/2017   Procedure: ARTHROSCOPY KNEE EXTENSIVE SYNOVECTOMY AND LYSIS OF ADHESIONS;  Surgeon: Donato Heinz, MD;  Location: [facility];  Service: Orthopedics;  Laterality: Right;   KNEE DEBRIDEMENT Left    tendon   LEFT HEART CATH AND CORONARY ANGIOGRAPHY Left 04/11/2020   Procedure: LEFT HEART CATH AND CORONARY ANGIOGRAPHY;  Surgeon: Lamar Blinks, MD;  Location: [facility];  Service: Cardiovascular;  Laterality: Left;   LOWER EXTREMITY ANGIOGRAPHY Left 09/24/2020   Procedure: LOWER EXTREMITY ANGIOGRAPHY;  Surgeon: Renford Dills, MD;  Location: [facility]  [facility];  Service: Cardiovascular;  Laterality: Left;   LOWER EXTREMITY ANGIOGRAPHY Right 07/13/2023   Procedure: Lower Extremity Angiography;  Surgeon: Renford Dills, MD;  Location: [facility];  Service: Cardiovascular;  Laterality: Right;   TONSILLECTOMY  72   TOTAL KNEE ARTHROPLASTY Right 04/15/2015   Procedure: TOTAL KNEE ARTHROPLASTY;  Surgeon: Gean Birchwood, MD;  Location: [facility];  Service: Orthopedics;  Laterality: Right;   TRANSURETHRAL RESECTION OF PROSTATE  ?    Family History  Problem Relation Age of Onset   Multiple myeloma Mother    Aneurysm Father    Aneurysm Brother    Nephrolithiasis Neg Hx    Bladder Cancer Neg Hx    Prostate cancer Neg Hx    Kidney cancer Neg Hx     Allergies  Allergen Reactions   Bee Venom Shortness Of Breath and Swelling   Sulfa Antibiotics Swelling    Tongue swelling   Ben Gay Ultra [Menthol] Other (See Comments)    Whelts and redness of skin       Latest Ref Rng & Units 08/28/2023    5:24 AM 08/26/2023    7:30 PM 08/21/2023   10:07 AM  CBC  WBC 4.0 - 10.5 K/uL 13.1  13.6  14.3   Hemoglobin 13.0 - 17.0 g/dL 9.8  13.0  86.5   Hematocrit 39.0 - 52.0 % 28.4  32.7  29.8   Platelets 150 - 400 K/uL 333  423  220       CMP     Component Value Date/Time   NA 137 08/28/2023 0524   K 3.9 08/28/2023 0524   CL 104 08/28/2023 0524   CO2 25 08/28/2023 0524   GLUCOSE 135 (H) 08/28/2023 0524   BUN 9 08/28/2023 0524   CREATININE 0.92 08/28/2023 0524   CALCIUM 8.5 (L) 08/28/2023 0524   PROT 6.7 08/26/2023 1930   ALBUMIN 3.8 08/26/2023 1930   AST 28 08/26/2023 1930   ALT 39 08/26/2023 1930   ALKPHOS 44 08/26/2023 1930   BILITOT 0.7 08/26/2023 1930   GFR 67.55 12/02/2020 0812   GFRNONAA >60 08/28/2023 7846  VAS [place] ABI WITH/WO TBI  Result Date: 09/16/2023  LOWER EXTREMITY DOPPLER STUDY Patient Name:  ELIM PIEL  Date of Exam:   09/09/2023 Medical Rec #: [id number]       Accession #:    [id number] Date of Birth: [birth year]        Patient Gender: M Patient Age:   24 years Exam Location:  [facility] Procedure:      VAS [place] ABI WITH/WO TBI Referring Phys: --------------------------------------------------------------------------------  Indications: Peripheral artery disease.  Vascular Interventions: 09/24/20: Left SFA/popliteal stent with peroneal PTA;                         08/18/23: Right fem-peroneal in-situ BPG with popliteal,                         ATA & peroneal endarterectomies;. Performing Technologist: Jamse Mead RT, RDMS, RVT  Examination Guidelines: A complete evaluation includes at minimum, Doppler waveform signals and systolic blood pressure reading at the level of bilateral brachial, anterior tibial, and posterior tibial arteries, when vessel segments are accessible. Bilateral testing is considered an integral part of a complete examination. Photoelectric Plethysmograph (PPG) waveforms and toe systolic pressure readings are included as required and additional duplex testing as needed. Limited examinations for reoccurring indications may be performed as noted.  ABI Findings: +---------+------------------+-----+--------------------------+-------+ Right    Rt Pressure (mmHg)IndexWaveform                  Comment +---------+------------------+-----+--------------------------+-------+ Brachial 145                                                      +---------+------------------+-----+--------------------------+-------+ ATA      106               0.73 hyperemic/audibly biphasic        +---------+------------------+-----+--------------------------+-------+ PTA                             occluded on angio                 +---------+------------------+-----+--------------------------+-------+ PERO     118               0.81 hyperemic/audibly biphasic        +---------+------------------+-----+--------------------------+-------+ Great Toe64                0.44 Mildly dampened                    +---------+------------------+-----+--------------------------+-------+ +---------+------------------+-----+---------------+-------+ Left     Lt Pressure (mmHg)IndexWaveform       Comment +---------+------------------+-----+---------------+-------+ Brachial 145                                           +---------+------------------+-----+---------------+-------+ ATA      115               0.79 biphasic               +---------+------------------+-----+---------------+-------+ PTA  not detected           +---------+------------------+-----+---------------+-------+ PERO     148               1.02 biphasic               +---------+------------------+-----+---------------+-------+ Great Toe82                0.57 Mildly dampened        +---------+------------------+-----+---------------+-------+ +-------+-----------+-----------+------------+------------+ ABI/TBIToday's ABIToday's TBIPrevious ABIPrevious TBI +-------+-----------+-----------+------------+------------+ Right  0.81       0.44       0.53        0.47         +-------+-----------+-----------+------------+------------+ Left   1.02       0.57       0.87        0.94         +-------+-----------+-----------+------------+------------+ Bilateral ABIs appear increased compared to prior study on 06/16/23.  Summary: Right: Resting right ankle-brachial index indicates mild right lower extremity arterial disease. The right toe-brachial index is abnormal. Left: Resting left ankle-brachial index is within normal range, however, there is evidence of moderate tibial level arterial disease. The left toe-brachial index is abnormal. *See table(s) above for measurements and observations.  Electronically signed by [name] MD on 09/16/2023 at 5:56:52 PM.    Final        Assessment & Plan:   1. Wound dehiscence Given the fact that the fibrinous exudate is not responding to  conservative treatment measures over the last several weeks I feel it would be in his best interest to move forward with a debridement of the area with wound VAC placement.  This will allow the wound to heal faster as well as to ensure there are no underlying pockets of infection.  We discussed the risk, benefits and alternatives we will plan to have the patient undergo debridement soon   Current Outpatient Medications on File Prior to Visit  Medication Sig Dispense Refill   acetaminophen (TYLENOL) 500 MG tablet Take 1,000 mg by mouth every 6 (six) hours as needed for mild pain or headache.     ascorbic acid (VITAMIN C) 500 MG tablet Take 500 mg by mouth daily with supper.     aspirin EC 81 MG tablet Take 81 mg by mouth at bedtime.     chlorthalidone (HYGROTON) 25 MG tablet Take 25 mg by mouth daily.     cholecalciferol (VITAMIN D3) 25 MCG (1000 UNIT) tablet Take 1,000 Units by mouth daily with supper.     clopidogrel (PLAVIX) 75 MG tablet Take 1 tablet (75 mg total) by mouth daily at 6 (six) AM. 30 tablet 11   Coenzyme Q10 (CO Q-10) 100 MG CAPS Take 200 mg by mouth daily with supper.     colchicine 0.6 MG tablet Take 0.6 mg by mouth as needed.     Febuxostat 80 MG TABS Take 80 mg by mouth daily with supper.     fluticasone (FLONASE) 50 MCG/ACT nasal spray Place 1 spray into both nostrils in the morning and at bedtime.     glucose blood test strip Use as instructed 100 each 12   levothyroxine (SYNTHROID) 75 MCG tablet Take 75 mcg by mouth daily before breakfast.     magnesium oxide (MAG-OX) 400 MG tablet Take 400 mg by mouth daily.     metFORMIN (GLUCOPHAGE) 500 MG tablet Take 2 tablets (1,000 mg total) by mouth 2 (two) times daily with a meal. (  Patient taking differently: Take 500 mg by mouth 2 (two) times daily with a meal.) 360 tablet 3   MOUNJARO 7.5 MG/0.5ML Pen Inject 7.5 mg into the skin every Monday.     Omega-3 Fatty Acids (FISH OIL) 1200 MG CAPS Take 1,200 mg by mouth 2 (two) times  daily.     oxyCODONE-acetaminophen (PERCOCET/ROXICET) 5-325 MG tablet Take 1-2 tablets by mouth every 4 (four) hours as needed for moderate pain. 30 tablet 0   pantoprazole (PROTONIX) 40 MG tablet Take 40 mg by mouth daily.     pregabalin (LYRICA) 50 MG capsule Take 50 mg by mouth daily.     rosuvastatin (CRESTOR) 10 MG tablet Take 1 tablet (10 mg total) by mouth daily. (Patient taking differently: Take 10 mg by mouth daily with supper.) 90 tablet 3   telmisartan (MICARDIS) 80 MG tablet Take 80 mg by mouth daily with supper.     torsemide (DEMADEX) 20 MG tablet Take 20 mg by mouth once.     No current facility-administered medications on file prior to visit.    There are no Patient Instructions on file for this visit. No follow-ups on file.   Georgiana Spinner, NP

## 2023-09-20 NOTE — Telephone Encounter (Signed)
Trey Paula from [facility] left a message with requesting verbal orders to change wound vac on Monday, Wednesday, and Friday. The patient is schedule for wound debridement with vac placement on Wednesday 09/22/2023 and the nurse was requesting if vac could be change on Friday. I spoke with Dr Gilda Crease and is fine with vac changes being three times a week M,W,F but the first initial change by home health should be Monday. Trey Paula was notified medical recommendations form provider.

## 2023-09-20 NOTE — H&P (View-Only) (Signed)
Subjective:    Patient ID: Juan Watson, male    DOB: 1951/11/27, 72 y.o.   MRN: [id number] Chief Complaint  Patient presents with   Follow-up    Staple Removal    Today the patient returns for evaluation of his quad wound as well as his leg incisions.  The incisions on his legs are healing well as is the swelling greatly improved.  However his groin incisions not noted any significant improvement with use of Medihoney.  There are some concern by his home health nurse that the wound is becoming somewhat malodorous and may currently have an infection.  Today it certainly does appear that the fibrinous exudate that was present has not loosened but there is a slight odor present in the lung.  Today the groin incision measures approximately 5 x 2 x 1.  He currently follows a diabetic diet.    Review of Systems  Cardiovascular:  Positive for leg swelling.  Skin:  Positive for wound.  Neurological:  Positive for weakness.  All other systems reviewed and are negative.      Objective:   Physical Exam Vitals reviewed.  HENT:     Head: Normocephalic.  Cardiovascular:     Rate and Rhythm: Normal rate.     Pulses:          Dorsalis pedis pulses are 1+ on the right side.  Pulmonary:     Effort: Pulmonary effort is normal.  Skin:    General: Skin is warm and dry.  Neurological:     Mental Status: He is alert and oriented to person, place, and time.     Gait: Gait abnormal.  Psychiatric:        Mood and Affect: Mood normal.        Behavior: Behavior normal.        Thought Content: Thought content normal.        Judgment: Judgment normal.     BP 127/78 (BP Location: Left Arm)   Pulse 94   Resp 18   Ht 5\' 7"  (1.702 m)   Wt 219 lb (99.3 kg)   BMI 34.30 kg/m   Past Medical History:  Diagnosis Date   Acquired flat foot 11/15/2015   Arthritis of knee, degenerative 04/15/2015   BPH (benign prostatic hyperplasia)    Chewing tobacco nicotine dependence without complication  08/10/2020   Cholecystitis 06/28/2017   Coronary artery disease    Diabetes mellitus without complication (HCC)    Diabetes mellitus, type 2 (HCC) 12/13/2002   Overview:  DIET CONTROLLED    Disease of nail 07/16/2011   Encounter for screening for malignant neoplasm of prostate 10/29/2014   Essential (primary) hypertension 10/17/2010   Familial aortic aneurysm 03/26/2014   Family history of cardiovascular disease 03/26/2014   Gastro-esophageal reflux disease without esophagitis 08/11/2013   Generalized OA 08/11/2013   Gout 03/26/2014   Heart murmur    HOH (hard of hearing)    aids   Motion sickness    "sea sick"   Peripheral vascular disease (HCC)    Primary osteoarthritis of right knee 04/14/2015   Pure hypercholesterolemia 12/13/2002   Skin lesion 03/11/2015    Social History   Socioeconomic History   Marital status: Married    Spouse name: Bonita Quin    Number of children: 2   Years of education: Not on file   Highest education level: Not on file  Occupational History   Occupation: Retired   Tobacco Use   Smoking  status: Former    Current packs/day: 0.00    Average packs/day: 2.0 packs/day for 25.0 years (50.0 ttl pk-yrs)    Types: Cigarettes    Start date: 07/16/1967    Quit date: 07/15/1992    Years since quitting: 31.2   Smokeless tobacco: Current    Types: Chew  Vaping Use   Vaping status: Never Used  Substance and Sexual Activity   Alcohol use: No   Drug use: No   Sexual activity: Not Currently  Other Topics Concern   Not on file  Social History Narrative   Lives at home with wife private residence   Social Determinants of Health   Financial Resource Strain: Low Risk  (09/08/2023)   Received from [facility]   Overall Financial Resource Strain (CARDIA)    Difficulty of Paying Living Expenses: Not hard at all  Food Insecurity: No Food Insecurity (09/08/2023)   Received from [facility]   Hunger Vital Sign    Worried  About Running Out of Food in the Last Year: Never true    Ran Out of Food in the Last Year: Never true  Transportation Needs: No Transportation Needs (09/08/2023)   Received from [facility] - Transportation    In the past 12 months, has lack of transportation kept you from medical appointments or from getting medications?: No    Lack of Transportation (Non-Medical): No  Physical Activity: Not on file  Stress: No Stress Concern Present (06/11/2020)   [employer] of Occupational Health - Occupational Stress Questionnaire    Feeling of Stress : Not at all  Social Connections: Unknown (06/11/2020)   Social Connection and Isolation Panel [NHANES]    Frequency of Communication with Friends and Family: More than three times a week    Frequency of Social Gatherings with Friends and Family: More than three times a week    Attends Religious Services: Not on file    Active Member of Clubs or Organizations: Not on file    Attends Banker Meetings: Not on file    Marital Status: Married  Intimate Partner Violence: Not At Risk (08/27/2023)   Humiliation, Afraid, Rape, and Kick questionnaire    Fear of Current or Ex-Partner: No    Emotionally Abused: No    Physically Abused: No    Sexually Abused: No    Past Surgical History:  Procedure Laterality Date   BACK SURGERY  77,87   CARDIAC CATHETERIZATION  70's   pericarditis   CATARACT EXTRACTION W/PHACO Right 03/23/2016   Procedure: CATARACT EXTRACTION PHACO AND INTRAOCULAR LENS PLACEMENT (IOC);  Surgeon: Sallee Lange, MD;  Location: [facility];  Service: Ophthalmology;  Laterality: Right;  [place] 01:31    CATARACT EXTRACTION W/PHACO Left 10/23/2019   Procedure: CATARACT EXTRACTION PHACO AND INTRAOCULAR LENS PLACEMENT (IOC) LEFT DIABETIC TORIC LENS 5.82,   00:44.1;  Surgeon: Nevada Crane, MD;  Location: [facility];  Service: Ophthalmology;  Laterality: Left;  Diabetic - oral meds    CHOLECYSTECTOMY N/A 06/29/2017   Procedure: LAPAROSCOPIC CHOLECYSTECTOMY;  Surgeon: Henrene Dodge, MD;  Location: [facility];  Service: General;  Laterality: N/A;   COLONOSCOPY WITH PROPOFOL N/A 10/06/2017   Procedure: COLONOSCOPY WITH PROPOFOL;  Surgeon: Scot Jun, MD;  Location: [facility] ENDOSCOPY;  Service: Endoscopy;  Laterality: N/A;   CORONARY STENT INTERVENTION N/A 04/11/2020   Procedure: CORONARY STENT INTERVENTION;  Surgeon: Alwyn Pea, MD;  Location: [facility];  Service: Cardiovascular;  Laterality: N/A;   ENDARTERECTOMY FEMORAL Right 08/18/2023   Procedure: ENDARTERECTOMY FEMORAL;  Surgeon: Renford Dills, MD;  Location: [facility];  Service: Vascular;  Laterality: Right;   ENDARTERECTOMY POPLITEAL Right 08/18/2023   Procedure: ENDARTERECTOMY POPLITEAL;  Surgeon: Renford Dills, MD;  Location: [facility];  Service: Vascular;  Laterality: Right;   ENDARTERECTOMY TIBIOPERONEAL Right 08/18/2023   Procedure: ENDARTERECTOMY TIBIOPERONEAL;  Surgeon: Renford Dills, MD;  Location: [facility];  Service: Vascular;  Laterality: Right;   FEMORAL-TIBIAL BYPASS GRAFT Right 08/18/2023   Procedure: BYPASS GRAFT FEMORAL-TIBIAL ARTERY (WITH VEIN);  Surgeon: Renford Dills, MD;  Location: [facility];  Service: Vascular;  Laterality: Right;   HERNIA REPAIR  11,   umbilical x2   KNEE ARTHROSCOPY Right 12/20/2017   Procedure: ARTHROSCOPY KNEE EXTENSIVE SYNOVECTOMY AND LYSIS OF ADHESIONS;  Surgeon: Donato Heinz, MD;  Location: [facility];  Service: Orthopedics;  Laterality: Right;   KNEE DEBRIDEMENT Left    tendon   LEFT HEART CATH AND CORONARY ANGIOGRAPHY Left 04/11/2020   Procedure: LEFT HEART CATH AND CORONARY ANGIOGRAPHY;  Surgeon: Lamar Blinks, MD;  Location: [facility];  Service: Cardiovascular;  Laterality: Left;   LOWER EXTREMITY ANGIOGRAPHY Left 09/24/2020   Procedure: LOWER EXTREMITY ANGIOGRAPHY;  Surgeon: Renford Dills, MD;  Location: [facility]  [facility];  Service: Cardiovascular;  Laterality: Left;   LOWER EXTREMITY ANGIOGRAPHY Right 07/13/2023   Procedure: Lower Extremity Angiography;  Surgeon: Renford Dills, MD;  Location: [facility];  Service: Cardiovascular;  Laterality: Right;   TONSILLECTOMY  72   TOTAL KNEE ARTHROPLASTY Right 04/15/2015   Procedure: TOTAL KNEE ARTHROPLASTY;  Surgeon: Gean Birchwood, MD;  Location: [facility];  Service: Orthopedics;  Laterality: Right;   TRANSURETHRAL RESECTION OF PROSTATE  ?    Family History  Problem Relation Age of Onset   Multiple myeloma Mother    Aneurysm Father    Aneurysm Brother    Nephrolithiasis Neg Hx    Bladder Cancer Neg Hx    Prostate cancer Neg Hx    Kidney cancer Neg Hx     Allergies  Allergen Reactions   Bee Venom Shortness Of Breath and Swelling   Sulfa Antibiotics Swelling    Tongue swelling   Ben Gay Ultra [Menthol] Other (See Comments)    Whelts and redness of skin       Latest Ref Rng & Units 08/28/2023    5:24 AM 08/26/2023    7:30 PM 08/21/2023   10:07 AM  CBC  WBC 4.0 - 10.5 K/uL 13.1  13.6  14.3   Hemoglobin 13.0 - 17.0 g/dL 9.8  13.0  86.5   Hematocrit 39.0 - 52.0 % 28.4  32.7  29.8   Platelets 150 - 400 K/uL 333  423  220       CMP     Component Value Date/Time   NA 137 08/28/2023 0524   K 3.9 08/28/2023 0524   CL 104 08/28/2023 0524   CO2 25 08/28/2023 0524   GLUCOSE 135 (H) 08/28/2023 0524   BUN 9 08/28/2023 0524   CREATININE 0.92 08/28/2023 0524   CALCIUM 8.5 (L) 08/28/2023 0524   PROT 6.7 08/26/2023 1930   ALBUMIN 3.8 08/26/2023 1930   AST 28 08/26/2023 1930   ALT 39 08/26/2023 1930   ALKPHOS 44 08/26/2023 1930   BILITOT 0.7 08/26/2023 1930   GFR 67.55 12/02/2020 0812   GFRNONAA >60 08/28/2023 7846  VAS [place] ABI WITH/WO TBI  Result Date: 09/16/2023  LOWER EXTREMITY DOPPLER STUDY Patient Name:  ELIM PIEL  Date of Exam:   09/09/2023 Medical Rec #: [id number]       Accession #:    [id number] Date of Birth: [birth year]        Patient Gender: M Patient Age:   24 years Exam Location:  [facility] Procedure:      VAS [place] ABI WITH/WO TBI Referring Phys: --------------------------------------------------------------------------------  Indications: Peripheral artery disease.  Vascular Interventions: 09/24/20: Left SFA/popliteal stent with peroneal PTA;                         08/18/23: Right fem-peroneal in-situ BPG with popliteal,                         ATA & peroneal endarterectomies;. Performing Technologist: Jamse Mead RT, RDMS, RVT  Examination Guidelines: A complete evaluation includes at minimum, Doppler waveform signals and systolic blood pressure reading at the level of bilateral brachial, anterior tibial, and posterior tibial arteries, when vessel segments are accessible. Bilateral testing is considered an integral part of a complete examination. Photoelectric Plethysmograph (PPG) waveforms and toe systolic pressure readings are included as required and additional duplex testing as needed. Limited examinations for reoccurring indications may be performed as noted.  ABI Findings: +---------+------------------+-----+--------------------------+-------+ Right    Rt Pressure (mmHg)IndexWaveform                  Comment +---------+------------------+-----+--------------------------+-------+ Brachial 145                                                      +---------+------------------+-----+--------------------------+-------+ ATA      106               0.73 hyperemic/audibly biphasic        +---------+------------------+-----+--------------------------+-------+ PTA                             occluded on angio                 +---------+------------------+-----+--------------------------+-------+ PERO     118               0.81 hyperemic/audibly biphasic        +---------+------------------+-----+--------------------------+-------+ Great Toe64                0.44 Mildly dampened                    +---------+------------------+-----+--------------------------+-------+ +---------+------------------+-----+---------------+-------+ Left     Lt Pressure (mmHg)IndexWaveform       Comment +---------+------------------+-----+---------------+-------+ Brachial 145                                           +---------+------------------+-----+---------------+-------+ ATA      115               0.79 biphasic               +---------+------------------+-----+---------------+-------+ PTA  not detected           +---------+------------------+-----+---------------+-------+ PERO     148               1.02 biphasic               +---------+------------------+-----+---------------+-------+ Great Toe82                0.57 Mildly dampened        +---------+------------------+-----+---------------+-------+ +-------+-----------+-----------+------------+------------+ ABI/TBIToday's ABIToday's TBIPrevious ABIPrevious TBI +-------+-----------+-----------+------------+------------+ Right  0.81       0.44       0.53        0.47         +-------+-----------+-----------+------------+------------+ Left   1.02       0.57       0.87        0.94         +-------+-----------+-----------+------------+------------+ Bilateral ABIs appear increased compared to prior study on 06/16/23.  Summary: Right: Resting right ankle-brachial index indicates mild right lower extremity arterial disease. The right toe-brachial index is abnormal. Left: Resting left ankle-brachial index is within normal range, however, there is evidence of moderate tibial level arterial disease. The left toe-brachial index is abnormal. *See table(s) above for measurements and observations.  Electronically signed by [name] MD on 09/16/2023 at 5:56:52 PM.    Final        Assessment & Plan:   1. Wound dehiscence Given the fact that the fibrinous exudate is not responding to  conservative treatment measures over the last several weeks I feel it would be in his best interest to move forward with a debridement of the area with wound VAC placement.  This will allow the wound to heal faster as well as to ensure there are no underlying pockets of infection.  We discussed the risk, benefits and alternatives we will plan to have the patient undergo debridement soon   Current Outpatient Medications on File Prior to Visit  Medication Sig Dispense Refill   acetaminophen (TYLENOL) 500 MG tablet Take 1,000 mg by mouth every 6 (six) hours as needed for mild pain or headache.     ascorbic acid (VITAMIN C) 500 MG tablet Take 500 mg by mouth daily with supper.     aspirin EC 81 MG tablet Take 81 mg by mouth at bedtime.     chlorthalidone (HYGROTON) 25 MG tablet Take 25 mg by mouth daily.     cholecalciferol (VITAMIN D3) 25 MCG (1000 UNIT) tablet Take 1,000 Units by mouth daily with supper.     clopidogrel (PLAVIX) 75 MG tablet Take 1 tablet (75 mg total) by mouth daily at 6 (six) AM. 30 tablet 11   Coenzyme Q10 (CO Q-10) 100 MG CAPS Take 200 mg by mouth daily with supper.     colchicine 0.6 MG tablet Take 0.6 mg by mouth as needed.     Febuxostat 80 MG TABS Take 80 mg by mouth daily with supper.     fluticasone (FLONASE) 50 MCG/ACT nasal spray Place 1 spray into both nostrils in the morning and at bedtime.     glucose blood test strip Use as instructed 100 each 12   levothyroxine (SYNTHROID) 75 MCG tablet Take 75 mcg by mouth daily before breakfast.     magnesium oxide (MAG-OX) 400 MG tablet Take 400 mg by mouth daily.     metFORMIN (GLUCOPHAGE) 500 MG tablet Take 2 tablets (1,000 mg total) by mouth 2 (two) times daily with a meal. (  Patient taking differently: Take 500 mg by mouth 2 (two) times daily with a meal.) 360 tablet 3   MOUNJARO 7.5 MG/0.5ML Pen Inject 7.5 mg into the skin every Monday.     Omega-3 Fatty Acids (FISH OIL) 1200 MG CAPS Take 1,200 mg by mouth 2 (two) times  daily.     oxyCODONE-acetaminophen (PERCOCET/ROXICET) 5-325 MG tablet Take 1-2 tablets by mouth every 4 (four) hours as needed for moderate pain. 30 tablet 0   pantoprazole (PROTONIX) 40 MG tablet Take 40 mg by mouth daily.     pregabalin (LYRICA) 50 MG capsule Take 50 mg by mouth daily.     rosuvastatin (CRESTOR) 10 MG tablet Take 1 tablet (10 mg total) by mouth daily. (Patient taking differently: Take 10 mg by mouth daily with supper.) 90 tablet 3   telmisartan (MICARDIS) 80 MG tablet Take 80 mg by mouth daily with supper.     torsemide (DEMADEX) 20 MG tablet Take 20 mg by mouth once.     No current facility-administered medications on file prior to visit.    There are no Patient Instructions on file for this visit. No follow-ups on file.   Georgiana Spinner, NP

## 2023-09-21 ENCOUNTER — Encounter
Admission: RE | Admit: 2023-09-21 | Discharge: 2023-09-21 | Disposition: A | Discharge status: Home / Self Care | Payer: Medicare PPO | Source: Ambulatory Visit | Attending: Vascular Surgery | Admitting: Vascular Surgery

## 2023-09-21 VITALS — Ht 67.0 in | Wt 211.0 lb

## 2023-09-21 DIAGNOSIS — E1159 Type 2 diabetes mellitus with other circulatory complications: Secondary | ICD-10-CM

## 2023-09-21 NOTE — Telephone Encounter (Signed)
Spoke with the patient and his wound debridement and vac placement has been rescheduled from 09/22/23 to 09/24/23 with Dr. Gilda Crease.

## 2023-09-22 ENCOUNTER — Other Ambulatory Visit (INDEPENDENT_AMBULATORY_CARE_PROVIDER_SITE_OTHER): Payer: Self-pay | Admitting: Nurse Practitioner

## 2023-09-22 DIAGNOSIS — T8130XA Disruption of wound, unspecified, initial encounter: Secondary | ICD-10-CM

## 2023-09-22 NOTE — Progress Notes (Signed)
Patient called in to pre-admission testing questioning what medications to take and hold for his surgery on Friday, October 25. Instructed to stop all over the counter supplements, do not take any more metformin, mounjaro was to be held for 7 days and after secure chatting Sheppard Plumber, NP continue taking the aspirin and plavix. He takes the aspirin in the evening and plavix at dinner time. He can take them after he gets home on the day of surgery. Patient and his wife acknowledged understanding.

## 2023-09-23 MED ORDER — CHLORHEXIDINE GLUCONATE CLOTH 2 % EX PADS: 6.0000 | MEDICATED_PAD | Freq: Once | CUTANEOUS | Status: DC

## 2023-09-23 MED ORDER — SODIUM CHLORIDE 0.9 % IV SOLN: INTRAVENOUS | Status: DC

## 2023-09-23 MED ORDER — ORAL CARE MOUTH RINSE
15.0000 mL | Freq: Once | OROMUCOSAL | Status: DC
Start: 2023-09-23 — End: 2023-09-24

## 2023-09-23 MED ORDER — CEFAZOLIN SODIUM-DEXTROSE 2-4 GM/100ML-% IV SOLN
2.0000 g | INTRAVENOUS | Status: AC
  Administered 2023-09-24: 2 g via INTRAVENOUS

## 2023-09-23 MED ORDER — CHLORHEXIDINE GLUCONATE 0.12 % MT SOLN: 15.0000 mL | Freq: Once | OROMUCOSAL | Status: DC

## 2023-09-24 ENCOUNTER — Encounter
Admission: RE | Disposition: A | Discharge status: Home / Self Care | Payer: Self-pay | Source: Home / Self Care | Attending: Vascular Surgery

## 2023-09-24 ENCOUNTER — Encounter: Payer: Self-pay | Admitting: Vascular Surgery

## 2023-09-24 ENCOUNTER — Ambulatory Visit: Payer: Medicare PPO | Admitting: General Practice

## 2023-09-24 ENCOUNTER — Ambulatory Visit
Admission: RE | Admit: 2023-09-24 | Discharge: 2023-09-24 | Disposition: A | Discharge status: Home / Self Care | Payer: Medicare PPO | Attending: Vascular Surgery | Admitting: Vascular Surgery

## 2023-09-24 ENCOUNTER — Other Ambulatory Visit: Payer: Self-pay

## 2023-09-24 DIAGNOSIS — I96 Gangrene, not elsewhere classified: Secondary | ICD-10-CM | POA: Insufficient documentation

## 2023-09-24 DIAGNOSIS — I2510 Atherosclerotic heart disease of native coronary artery without angina pectoris: Secondary | ICD-10-CM | POA: Insufficient documentation

## 2023-09-24 DIAGNOSIS — Z79899 Other long term (current) drug therapy: Secondary | ICD-10-CM | POA: Diagnosis not present

## 2023-09-24 DIAGNOSIS — H9190 Unspecified hearing loss, unspecified ear: Secondary | ICD-10-CM | POA: Diagnosis not present

## 2023-09-24 DIAGNOSIS — M109 Gout, unspecified: Secondary | ICD-10-CM | POA: Diagnosis not present

## 2023-09-24 DIAGNOSIS — F17220 Nicotine dependence, chewing tobacco, uncomplicated: Secondary | ICD-10-CM | POA: Diagnosis not present

## 2023-09-24 DIAGNOSIS — T8130XA Disruption of wound, unspecified, initial encounter: Secondary | ICD-10-CM | POA: Diagnosis not present

## 2023-09-24 DIAGNOSIS — E1159 Type 2 diabetes mellitus with other circulatory complications: Secondary | ICD-10-CM

## 2023-09-24 DIAGNOSIS — E1151 Type 2 diabetes mellitus with diabetic peripheral angiopathy without gangrene: Secondary | ICD-10-CM | POA: Diagnosis not present

## 2023-09-24 DIAGNOSIS — Z7985 Long-term (current) use of injectable non-insulin antidiabetic drugs: Secondary | ICD-10-CM | POA: Insufficient documentation

## 2023-09-24 DIAGNOSIS — E1152 Type 2 diabetes mellitus with diabetic peripheral angiopathy with gangrene: Secondary | ICD-10-CM | POA: Diagnosis present

## 2023-09-24 DIAGNOSIS — K219 Gastro-esophageal reflux disease without esophagitis: Secondary | ICD-10-CM | POA: Diagnosis not present

## 2023-09-24 DIAGNOSIS — Z96651 Presence of right artificial knee joint: Secondary | ICD-10-CM | POA: Insufficient documentation

## 2023-09-24 DIAGNOSIS — E7800 Pure hypercholesterolemia, unspecified: Secondary | ICD-10-CM | POA: Insufficient documentation

## 2023-09-24 DIAGNOSIS — I10 Essential (primary) hypertension: Secondary | ICD-10-CM | POA: Insufficient documentation

## 2023-09-24 DIAGNOSIS — Z7984 Long term (current) use of oral hypoglycemic drugs: Secondary | ICD-10-CM | POA: Insufficient documentation

## 2023-09-24 HISTORY — PX: APPLICATION OF WOUND VAC: SHX5189

## 2023-09-24 HISTORY — PX: WOUND DEBRIDEMENT: SHX247

## 2023-09-24 LAB — CBC WITH DIFFERENTIAL/PLATELET
Abs Immature Granulocytes: 0.04 10*3/uL (ref 0.00–0.07)
Basophils Absolute: 0.1 10*3/uL (ref 0.0–0.1)
Basophils Relative: 1 %
Eosinophils Absolute: 0.3 10*3/uL (ref 0.0–0.5)
Eosinophils Relative: 5 %
HCT: 35.6 % — ABNORMAL LOW (ref 39.0–52.0)
Hemoglobin: 11.4 g/dL — ABNORMAL LOW (ref 13.0–17.0)
Immature Granulocytes: 1 %
Lymphocytes Relative: 25 %
Lymphs Abs: 1.7 10*3/uL (ref 0.7–4.0)
MCH: 25.6 pg — ABNORMAL LOW (ref 26.0–34.0)
MCHC: 32 g/dL (ref 30.0–36.0)
MCV: 80 fL (ref 80.0–100.0)
Monocytes Absolute: 0.7 10*3/uL (ref 0.1–1.0)
Monocytes Relative: 10 %
Neutro Abs: 4.1 10*3/uL (ref 1.7–7.7)
Neutrophils Relative %: 58 %
Platelets: 246 10*3/uL (ref 150–400)
RBC: 4.45 MIL/uL (ref 4.22–5.81)
RDW: 13.2 % (ref 11.5–15.5)
WBC: 7 10*3/uL (ref 4.0–10.5)
nRBC: 0 % (ref 0.0–0.2)

## 2023-09-24 LAB — GLUCOSE, CAPILLARY
Glucose-Capillary: 168 mg/dL — ABNORMAL HIGH (ref 70–99)
Glucose-Capillary: 153 mg/dL — ABNORMAL HIGH (ref 70–99)

## 2023-09-24 SURGERY — DEBRIDEMENT, WOUND
Anesthesia: General | Site: Groin | Laterality: Right

## 2023-09-24 MED ORDER — BUPIVACAINE-EPINEPHRINE (PF) 0.5% -1:200000 IJ SOLN
INTRAMUSCULAR | Status: AC
  Filled 2023-09-24: qty 30

## 2023-09-24 MED ORDER — OXYCODONE HCL 5 MG/5ML PO SOLN: 5.0000 mg | Freq: Once | ORAL | Status: DC | PRN

## 2023-09-24 MED ORDER — ONDANSETRON HCL 4 MG/2ML IJ SOLN
INTRAMUSCULAR | Status: DC | PRN
Start: 2023-09-24 — End: 2023-09-24
  Administered 2023-09-24: 4 mg via INTRAVENOUS

## 2023-09-24 MED ORDER — FENTANYL CITRATE (PF) 100 MCG/2ML IJ SOLN
INTRAMUSCULAR | Status: AC
  Filled 2023-09-24: qty 2

## 2023-09-24 MED ORDER — PROPOFOL 1000 MG/100ML IV EMUL
INTRAVENOUS | Status: AC
  Filled 2023-09-24: qty 100

## 2023-09-24 MED ORDER — OXYCODONE HCL 5 MG PO TABS: 5.0000 mg | ORAL_TABLET | Freq: Once | ORAL | Status: DC | PRN

## 2023-09-24 MED ORDER — HYDROCODONE-ACETAMINOPHEN 5-325 MG PO TABS: 1.0000 | ORAL_TABLET | Freq: Four times a day (QID) | ORAL | 0 refills | Status: AC | PRN

## 2023-09-24 MED ORDER — BUPIVACAINE-EPINEPHRINE (PF) 0.5% -1:200000 IJ SOLN
INTRAMUSCULAR | Status: DC | PRN
  Administered 2023-09-24: 10 mL via PERINEURAL

## 2023-09-24 MED ORDER — DEXMEDETOMIDINE HCL IN NACL 80 MCG/20ML IV SOLN
INTRAVENOUS | Status: DC | PRN
Start: 2023-09-24 — End: 2023-09-24
  Administered 2023-09-24: 4 ug via INTRAVENOUS
  Administered 2023-09-24: 8 ug via INTRAVENOUS

## 2023-09-24 MED ORDER — FENTANYL CITRATE (PF) 100 MCG/2ML IJ SOLN
INTRAMUSCULAR | Status: DC | PRN
Start: 2023-09-24 — End: 2023-09-24
  Administered 2023-09-24: 50 ug via INTRAVENOUS

## 2023-09-24 MED ORDER — LIDOCAINE HCL (PF) 2 % IJ SOLN
INTRAMUSCULAR | Status: DC | PRN
  Administered 2023-09-24: 100 mg via INTRADERMAL

## 2023-09-24 MED ORDER — FENTANYL CITRATE (PF) 100 MCG/2ML IJ SOLN: 25.0000 ug | INTRAMUSCULAR | Status: DC | PRN

## 2023-09-24 MED ORDER — PROPOFOL 10 MG/ML IV BOLUS
INTRAVENOUS | Status: DC | PRN
  Administered 2023-09-24: 170 mg via INTRAVENOUS

## 2023-09-24 MED ORDER — CEFAZOLIN SODIUM-DEXTROSE 2-4 GM/100ML-% IV SOLN
INTRAVENOUS | Status: AC
  Filled 2023-09-24: qty 100

## 2023-09-24 MED ORDER — 0.9 % SODIUM CHLORIDE (POUR BTL) OPTIME
TOPICAL | Status: DC | PRN
  Administered 2023-09-24: 500 mL

## 2023-09-24 MED ORDER — HYDROMORPHONE HCL 1 MG/ML IJ SOLN: 1.0000 mg | Freq: Once | INTRAMUSCULAR | Status: DC | PRN

## 2023-09-24 MED ORDER — ONDANSETRON HCL 4 MG/2ML IJ SOLN: 4.0000 mg | Freq: Four times a day (QID) | INTRAMUSCULAR | Status: DC | PRN

## 2023-09-24 SURGICAL SUPPLY — 35 items
APL PRP STRL LF DISP 70% ISPRP (MISCELLANEOUS)
BNDG CMPR 5X6 CHSV STRCH STRL (GAUZE/BANDAGES/DRESSINGS) ×1
BNDG CMPR 75X21 PLY HI ABS (MISCELLANEOUS)
BNDG COHESIVE 6X5 TAN ST LF (GAUZE/BANDAGES/DRESSINGS) ×1 IMPLANT
CANISTER WOUND CARE 500ML ATS (WOUND CARE) IMPLANT
CHLORAPREP W/TINT 26 (MISCELLANEOUS) ×1 IMPLANT
DRAPE EXTREMITY 106X87X128.5 (DRAPES) IMPLANT
DRAPE INCISE IOBAN 66X45 STRL (DRAPES) IMPLANT
DRSG EMULSION OIL 3X3 NADH (GAUZE/BANDAGES/DRESSINGS) IMPLANT
DRSG VAC GRANUFOAM LG (GAUZE/BANDAGES/DRESSINGS) IMPLANT
DRSG VAC GRANUFOAM MED (GAUZE/BANDAGES/DRESSINGS) IMPLANT
ELECT REM PT RETURN 9FT ADLT (ELECTROSURGICAL) ×1
ELECTRODE REM PT RTRN 9FT ADLT (ELECTROSURGICAL) ×1 IMPLANT
GAUZE SPONGE 4X4 12PLY STRL (GAUZE/BANDAGES/DRESSINGS) IMPLANT
GAUZE STRETCH 2X75IN STRL (MISCELLANEOUS) IMPLANT
GLOVE BIO SURGEON STRL SZ7 (GLOVE) ×1 IMPLANT
GLOVE SURG SYN 8.0 (GLOVE) ×1 IMPLANT
GLOVE SURG SYN 8.0 PF PI (GLOVE) ×1 IMPLANT
GOWN STRL REUS W/ TWL LRG LVL3 (GOWN DISPOSABLE) ×2 IMPLANT
GOWN STRL REUS W/ TWL XL LVL3 (GOWN DISPOSABLE) ×1 IMPLANT
GOWN STRL REUS W/TWL LRG LVL3 (GOWN DISPOSABLE) ×2
GOWN STRL REUS W/TWL XL LVL3 (GOWN DISPOSABLE) ×1
HANDLE YANKAUER SUCT BULB TIP (MISCELLANEOUS) IMPLANT
KIT TURNOVER KIT A (KITS) ×1 IMPLANT
LABEL OR SOLS (LABEL) ×1 IMPLANT
MANIFOLD NEPTUNE II (INSTRUMENTS) ×1 IMPLANT
NS IRRIG 500ML POUR BTL (IV SOLUTION) ×1 IMPLANT
PACK EXTREMITY ARMC (MISCELLANEOUS) ×1 IMPLANT
PAD PREP OB/GYN DISP 24X41 (PERSONAL CARE ITEMS) ×1 IMPLANT
SOL PREP PVP 2OZ (MISCELLANEOUS) ×1
SOLUTION PREP PVP 2OZ (MISCELLANEOUS) ×1 IMPLANT
SPONGE T-LAP 18X18 ~~LOC~~+RFID (SPONGE) IMPLANT
STOCKINETTE IMPERV 14X48 (MISCELLANEOUS) ×1 IMPLANT
TRAP FLUID SMOKE EVACUATOR (MISCELLANEOUS) ×1 IMPLANT
WATER STERILE IRR 500ML POUR (IV SOLUTION) ×1 IMPLANT

## 2023-09-24 NOTE — Anesthesia Procedure Notes (Signed)
Procedure Name: LMA Insertion Date/Time: 09/24/2023 7:44 AM  Performed by: Omer Jack, CRNAPre-anesthesia Checklist: Patient identified, Patient being monitored, Timeout performed, Emergency Drugs available and Suction available Patient Re-evaluated:Patient Re-evaluated prior to induction Oxygen Delivery Method: Circle system utilized Preoxygenation: Pre-oxygenation with 100% oxygen Induction Type: IV induction Ventilation: Mask ventilation without difficulty LMA: LMA inserted LMA Size: 4.0 Tube type: Oral Number of attempts: 1 Placement Confirmation: positive ETCO2 and breath sounds checked- equal and bilateral Tube secured with: Tape Dental Injury: Teeth and Oropharynx as per pre-operative assessment

## 2023-09-24 NOTE — Discharge Instructions (Signed)

## 2023-09-24 NOTE — Anesthesia Preprocedure Evaluation (Signed)
Anesthesia Evaluation  Patient identified by MRN, date of birth, ID band Patient awake    Reviewed: Allergy & Precautions, NPO status , Patient's Chart, lab work & pertinent test results  History of Anesthesia Complications Negative for: history of anesthetic complications  Airway Mallampati: III  TM Distance: >3 FB Neck ROM: Full    Dental no notable dental hx. (+) Chipped, Dental Advidsory Given, Edentulous Lower, Edentulous Upper   Pulmonary neg pulmonary ROS, former smoker   Pulmonary exam normal breath sounds clear to auscultation       Cardiovascular hypertension, Pt. on medications + CAD and + Peripheral Vascular Disease  Normal cardiovascular exam+ Valvular Problems/Murmurs  Rhythm:Regular Rate:Normal     Neuro/Psych Cataract negative neurological ROS  negative psych ROS   GI/Hepatic negative GI ROS, Neg liver ROS,GERD  Medicated and Controlled,,  Endo/Other  negative endocrine ROSdiabetes, Type 2, Oral Hypoglycemic Agents    Renal/GU      Musculoskeletal   Abdominal   Peds  Hematology negative hematology ROS (+)   Anesthesia Other Findings Past Medical History: 11/15/2015: Acquired flat foot 04/15/2015: Arthritis of knee, degenerative No date: BPH (benign prostatic hyperplasia) 08/10/2020: Chewing tobacco nicotine dependence without complication 06/28/2017: Cholecystitis No date: Coronary artery disease No date: Diabetes mellitus without complication (HCC) 12/13/2002: Diabetes mellitus, type 2 (HCC)     Comment:  Overview:  DIET CONTROLLED  07/16/2011: Disease of nail 10/29/2014: Encounter for screening for malignant neoplasm of prostate 10/17/2010: Essential (primary) hypertension 03/26/2014: Familial aortic aneurysm 03/26/2014: Family history of cardiovascular disease 08/11/2013: Gastro-esophageal reflux disease without esophagitis 08/11/2013: Generalized OA 03/26/2014: Gout No date: Heart  murmur No date: HOH (hard of hearing)     Comment:  aids No date: Motion sickness     Comment:  "sea sick" No date: Peripheral vascular disease (HCC) 04/14/2015: Primary osteoarthritis of right knee 12/13/2002: Pure hypercholesterolemia 03/11/2015: Skin lesion  Past Surgical History: 77,87: BACK SURGERY 70's: CARDIAC CATHETERIZATION     Comment:  pericarditis 03/23/2016: CATARACT EXTRACTION W/PHACO; Right     Comment:  Procedure: CATARACT EXTRACTION PHACO AND INTRAOCULAR               LENS PLACEMENT (IOC);  Surgeon: Sallee Lange, MD;                Location: [facility];  Service: Ophthalmology;  Laterality:              Right;  [place] 01:31 10/23/2019: CATARACT EXTRACTION W/PHACO; Left     Comment:  Procedure: CATARACT EXTRACTION PHACO AND INTRAOCULAR               LENS PLACEMENT (IOC) LEFT DIABETIC TORIC LENS 5.82,                 00:44.1;  Surgeon: Nevada Crane, MD;  Location:               [facility];  Service: Ophthalmology;                Laterality: Left;  Diabetic - oral meds 06/29/2017: CHOLECYSTECTOMY; N/A     Comment:  Procedure: LAPAROSCOPIC CHOLECYSTECTOMY;  Surgeon:               Henrene Dodge, MD;  Location: [facility];  Service:               General;  Laterality: N/A; 10/06/2017: COLONOSCOPY WITH PROPOFOL; N/A     Comment:  Procedure: COLONOSCOPY WITH PROPOFOL;  Surgeon: Mechele Collin,  Wilber Bihari, MD;  Location: [facility];  Service:               Endoscopy;  Laterality: N/A; 04/11/2020: CORONARY STENT INTERVENTION; N/A     Comment:  Procedure: CORONARY STENT INTERVENTION;  Surgeon:               Alwyn Pea, MD;  Location: [facility];               Service: Cardiovascular;  Laterality: N/A; 11,: HERNIA REPAIR     Comment:  umbilical x2 12/20/2017: KNEE ARTHROSCOPY; Right     Comment:  Procedure: ARTHROSCOPY KNEE EXTENSIVE SYNOVECTOMY AND               LYSIS OF ADHESIONS;  Surgeon: Donato Heinz, MD;                Location:  [facility];  Service: Orthopedics;  Laterality:               Right; No date: KNEE DEBRIDEMENT; Left     Comment:  tendon 04/11/2020: LEFT HEART CATH AND CORONARY ANGIOGRAPHY; Left     Comment:  Procedure: LEFT HEART CATH AND CORONARY ANGIOGRAPHY;                Surgeon: Lamar Blinks, MD;  Location: [facility]               [facility];  Service: Cardiovascular;  Laterality: Left; 09/24/2020: LOWER EXTREMITY ANGIOGRAPHY; Left     Comment:  Procedure: LOWER EXTREMITY ANGIOGRAPHY;  Surgeon:               Renford Dills, MD;  Location: [facility];               Service: Cardiovascular;  Laterality: Left; 07/13/2023: LOWER EXTREMITY ANGIOGRAPHY; Right     Comment:  Procedure: Lower Extremity Angiography;  Surgeon:               Renford Dills, MD;  Location: [facility];               Service: Cardiovascular;  Laterality: Right; 72: TONSILLECTOMY 04/15/2015: TOTAL KNEE ARTHROPLASTY; Right     Comment:  Procedure: TOTAL KNEE ARTHROPLASTY;  Surgeon: Gean Birchwood, MD;  Location: [facility];  Service: Orthopedics;                Laterality: Right; ?: TRANSURETHRAL RESECTION OF PROSTATE     Reproductive/Obstetrics negative OB ROS                             Anesthesia Physical Anesthesia Plan  ASA: 3  Anesthesia Plan: General   Post-op Pain Management: Minimal or no pain anticipated   Induction: Intravenous  PONV Risk Score and Plan: 3 and Ondansetron, Midazolam and Dexamethasone  Airway Management Planned: LMA  Additional Equipment: None  Intra-op Plan:   Post-operative Plan:   Informed Consent: I have reviewed the patients History and Physical, chart, labs and discussed the procedure including the risks, benefits and alternatives for the proposed anesthesia with the patient or authorized representative who has indicated his/her understanding and acceptance.     Dental advisory given  Plan Discussed with: CRNA and  Surgeon  Anesthesia Plan Comments: (Discussed risks of anesthesia with patient, including possibility of difficulty with  spontaneous ventilation under anesthesia necessitating airway intervention, PONV, and rare risks such as cardiac or respiratory or neurological events, and allergic reactions. Discussed the role of CRNA in patient's perioperative care. Patient understands.)       Anesthesia Quick Evaluation

## 2023-09-24 NOTE — Op Note (Signed)
    OPERATIVE NOTE   PROCEDURE: 1.  Excisional debridement of skin and soft tissue right groin. 2.  Application of a VAC wound dressing  PRE-OPERATIVE DIAGNOSIS: Superficial skin necrosis right groin incision  POST-OPERATIVE DIAGNOSIS: Same  SURGEON: [name]  ASSISTANT(S): None  ANESTHESIA: general  ESTIMATED BLOOD LOSS: Less than 5 cc cc  FINDING(S): Essentially the only layer which was necrotic was the skin and a very superficial per official amount of soft tissue.  The wound is approximately 3 mm in depth.  Wound is 8 cm  SPECIMEN(S): Wound culture to microbiology  INDICATIONS:   Juan Watson is a 72 y.o. male who presents with epidermal lysis of the skin of the right groin incision.  He is being brought to the operating room in case the wound is deeper and needs extensive debridement.  Risks and benefits of been reviewed all questions were answered patient agrees to proceed..  DESCRIPTION: After full informed written consent was obtained from the patient, the patient was brought back to the operating room and placed supine upon the operating table.  Prior to induction, the patient received IV antibiotics.   After obtaining adequate anesthesia, the patient was then prepped and draped in the standard fashion for a debridement of the right groin.  Half percent Marcaine with epinephrine is infiltrated in the soft tissue surrounding the incision.  Using a 15 blade scalpel in association with Metzenbaum scissors the devitalized tissue is excised.  Debridement is carried down to healthy appearing bleeding tissue.  Essentially it is the most superficial layer that is debrided.  The wound underneath is completely intact.  Hemostasis is achieved with Bovie cautery  Wound measures 4 cm x 2 cm x 3 mm in depth.  A VAC wound dressing is then applied with excellent seal at 125 mmHg suction.   The patient tolerated this procedure well.   COMPLICATIONS: None  CONDITION:  [name]  Office: [phone]   09/24/2023, 8:29 AM

## 2023-09-24 NOTE — Interval H&P Note (Signed)
History and Physical Interval Note:  09/24/2023 7:26 AM  Juan Watson  has presented today for surgery, with the diagnosis of WOUND DEHISCENCE.  The various methods of treatment have been discussed with the patient and family. After consideration of risks, benefits and other options for treatment, the patient has consented to  Procedure(s): DEBRIDEMENT WOUND (Right) APPLICATION OF WOUND VAC (Right) as a surgical intervention.  The patient's history has been reviewed, patient examined, no change in status, stable for surgery.  I have reviewed the patient's chart and labs.  Questions were answered to the patient's satisfaction.     [name]

## 2023-09-24 NOTE — Transfer of Care (Signed)
Immediate Anesthesia Transfer of Care Note  Patient: Juan Watson  Procedure(s) Performed: DEBRIDEMENT WOUND (Right: Groin) APPLICATION OF WOUND VAC (Right: Groin)  Patient Location: PACU  Anesthesia Type:General  Level of Consciousness: drowsy and patient cooperative  Airway & Oxygen Therapy: Patient Spontanous Breathing and Patient connected to face mask oxygen  Post-op Assessment: Report given to RN and Post -op Vital signs reviewed and stable  Post vital signs: Reviewed and stable  Last Vitals:  Vitals Value Taken Time  BP 123/74 09/24/23 0829  Temp 36.1 C 09/24/23 0826  Pulse 80 09/24/23 0828  Resp 16 09/24/23 0829  SpO2 99 % 09/24/23 0829  Vitals shown include unfiled device data.  Last Pain:  Vitals:   09/24/23 0826  TempSrc:   PainSc: 0-No pain         Complications: No notable events documented.

## 2023-09-24 NOTE — Anesthesia Postprocedure Evaluation (Signed)
Anesthesia Post Note  Patient: Juan Watson  Procedure(s) Performed: DEBRIDEMENT WOUND (Right: Groin) APPLICATION OF WOUND VAC (Right: Groin)  Patient location during evaluation: PACU Anesthesia Type: General Level of consciousness: awake and alert Pain management: pain level controlled Vital Signs Assessment: post-procedure vital signs reviewed and stable Respiratory status: spontaneous breathing, nonlabored ventilation, respiratory function stable and patient connected to nasal cannula oxygen Cardiovascular status: blood pressure returned to baseline and stable Postop Assessment: no apparent nausea or vomiting Anesthetic complications: no  No notable events documented.   Last Vitals:  Vitals:   09/24/23 0845 09/24/23 0915  BP: (!) 148/88 (!) 162/73  Pulse: 80 76  Resp: 14 17  Temp: (!) 36.1 C 36.5 C  SpO2: 97% 97%    Last Pain:  Vitals:   09/24/23 0915  TempSrc: Temporal  PainSc: 0-No pain                 Stephanie Coup

## 2023-09-29 LAB — AEROBIC/ANAEROBIC CULTURE W GRAM STAIN (SURGICAL/DEEP WOUND): Gram Stain: NONE SEEN

## 2023-10-05 ENCOUNTER — Other Ambulatory Visit (INDEPENDENT_AMBULATORY_CARE_PROVIDER_SITE_OTHER): Payer: Self-pay | Admitting: Vascular Surgery

## 2023-10-05 DIAGNOSIS — Z9889 Other specified postprocedural states: Secondary | ICD-10-CM

## 2023-10-06 NOTE — Progress Notes (Signed)
Patient ID: Juan Watson, male   DOB: [birth year], 72 y.o.   MRN: [id number]  No chief complaint on file.   HPI Juan Watson is a 72 y.o. male.    PROCEDURE 09/24/2023: 1.  Excisional debridement of skin and soft tissue right groin. 2.  Application of a VAC wound dressing.  PROCEDURE 08/18/2023: 1.  Right common femoral artery to peroneal artery bypass with in-situ saphenous vein graft. 2.  Right common femoral endarterectomy with repair using the hood of the great saphenous vein in situ bypass 3.  Separate and distinct endarterectomy of the right profunda femoris artery with bovine pericardial patch. 4.  Right popliteal and anterior tibial artery endarterectomy with primary repair 5.  Right tibioperoneal trunk and peroneal endarterectomy.   Today he denies pain he feels his right leg is better.  No specific right groin complaints.  VAC is working well.  ABIs obtained today Rt=0.71 biphasic (TBI 1.02) and Lt=1.02.  Toe tracings are now within the normal range and significantly improved compared to last study   Past Medical History:  Diagnosis Date   Acquired flat foot 11/15/2015   Arthritis of knee, degenerative 04/15/2015   BPH (benign prostatic hyperplasia)    Chewing tobacco nicotine dependence without complication 08/10/2020   Cholecystitis 06/28/2017   Coronary artery disease    Diabetes mellitus without complication (HCC)    Diabetes mellitus, type 2 (HCC) 12/13/2002   Overview:  DIET CONTROLLED    Disease of nail 07/16/2011   Encounter for screening for malignant neoplasm of prostate 10/29/2014   Essential (primary) hypertension 10/17/2010   Familial aortic aneurysm 03/26/2014   Family history of cardiovascular disease 03/26/2014   Gastro-esophageal reflux disease without esophagitis 08/11/2013   Generalized OA 08/11/2013   Gout 03/26/2014   Heart murmur    HOH (hard of hearing)    aids   Motion sickness    "sea sick"   Peripheral vascular disease (HCC)     Primary osteoarthritis of right knee 04/14/2015   Pure hypercholesterolemia 12/13/2002   Skin lesion 03/11/2015    Past Surgical History:  Procedure Laterality Date   APPLICATION OF WOUND VAC Right 09/24/2023   Procedure: APPLICATION OF WOUND VAC;  Surgeon: Renford Dills, MD;  Location: [facility];  Service: Vascular;  Laterality: Right;   BACK SURGERY  77,87   CARDIAC CATHETERIZATION  70's   pericarditis   CATARACT EXTRACTION W/PHACO Right 03/23/2016   Procedure: CATARACT EXTRACTION PHACO AND INTRAOCULAR LENS PLACEMENT (IOC);  Surgeon: Sallee Lange, MD;  Location: [facility];  Service: Ophthalmology;  Laterality: Right;  [place] 01:31    CATARACT EXTRACTION W/PHACO Left 10/23/2019   Procedure: CATARACT EXTRACTION PHACO AND INTRAOCULAR LENS PLACEMENT (IOC) LEFT DIABETIC TORIC LENS 5.82,   00:44.1;  Surgeon: Nevada Crane, MD;  Location: [facility];  Service: Ophthalmology;  Laterality: Left;  Diabetic - oral meds   CHOLECYSTECTOMY N/A 06/29/2017   Procedure: LAPAROSCOPIC CHOLECYSTECTOMY;  Surgeon: Henrene Dodge, MD;  Location: [facility];  Service: General;  Laterality: N/A;   COLONOSCOPY WITH PROPOFOL N/A 10/06/2017   Procedure: COLONOSCOPY WITH PROPOFOL;  Surgeon: Scot Jun, MD;  Location: [facility];  Service: Endoscopy;  Laterality: N/A;   CORONARY STENT INTERVENTION N/A 04/11/2020   Procedure: CORONARY STENT INTERVENTION;  Surgeon: Alwyn Pea, MD;  Location: [facility];  Service: Cardiovascular;  Laterality: N/A;   ENDARTERECTOMY FEMORAL Right 08/18/2023   Procedure: ENDARTERECTOMY FEMORAL;  Surgeon: Renford Dills, MD;  Location: [facility];  Service: Vascular;  Laterality: Right;   ENDARTERECTOMY POPLITEAL Right 08/18/2023   Procedure: ENDARTERECTOMY POPLITEAL;  Surgeon: Renford Dills, MD;  Location: [facility];  Service: Vascular;  Laterality: Right;   ENDARTERECTOMY TIBIOPERONEAL Right 08/18/2023   Procedure: ENDARTERECTOMY  TIBIOPERONEAL;  Surgeon: Renford Dills, MD;  Location: [facility];  Service: Vascular;  Laterality: Right;   FEMORAL-TIBIAL BYPASS GRAFT Right 08/18/2023   Procedure: BYPASS GRAFT FEMORAL-TIBIAL ARTERY (WITH VEIN);  Surgeon: Renford Dills, MD;  Location: [facility];  Service: Vascular;  Laterality: Right;   HERNIA REPAIR  11,   umbilical x2   KNEE ARTHROSCOPY Right 12/20/2017   Procedure: ARTHROSCOPY KNEE EXTENSIVE SYNOVECTOMY AND LYSIS OF ADHESIONS;  Surgeon: Donato Heinz, MD;  Location: [facility];  Service: Orthopedics;  Laterality: Right;   KNEE DEBRIDEMENT Left    tendon   LEFT HEART CATH AND CORONARY ANGIOGRAPHY Left 04/11/2020   Procedure: LEFT HEART CATH AND CORONARY ANGIOGRAPHY;  Surgeon: Lamar Blinks, MD;  Location: [facility];  Service: Cardiovascular;  Laterality: Left;   LOWER EXTREMITY ANGIOGRAPHY Left 09/24/2020   Procedure: LOWER EXTREMITY ANGIOGRAPHY;  Surgeon: Renford Dills, MD;  Location: [facility];  Service: Cardiovascular;  Laterality: Left;   LOWER EXTREMITY ANGIOGRAPHY Right 07/13/2023   Procedure: Lower Extremity Angiography;  Surgeon: Renford Dills, MD;  Location: [facility];  Service: Cardiovascular;  Laterality: Right;   TONSILLECTOMY  72   TOTAL KNEE ARTHROPLASTY Right 04/15/2015   Procedure: TOTAL KNEE ARTHROPLASTY;  Surgeon: Gean Birchwood, MD;  Location: [facility];  Service: Orthopedics;  Laterality: Right;   TRANSURETHRAL RESECTION OF PROSTATE  ?   WOUND DEBRIDEMENT Right 09/24/2023   Procedure: DEBRIDEMENT WOUND;  Surgeon: Renford Dills, MD;  Location: [facility];  Service: Vascular;  Laterality: Right;      Allergies  Allergen Reactions   Bee Venom Shortness Of Breath and Swelling   Sulfa Antibiotics Swelling    Tongue swelling   Ben Gay Ultra [Menthol] Other (See Comments)    Whelts and redness of skin    Current Outpatient Medications  Medication Sig Dispense Refill   acetaminophen (TYLENOL) 500 MG  tablet Take 1,000 mg by mouth every 6 (six) hours as needed for mild pain or headache.     ascorbic acid (VITAMIN C) 500 MG tablet Take 500 mg by mouth daily with supper.     aspirin EC 81 MG tablet Take 81 mg by mouth at bedtime.     chlorthalidone (HYGROTON) 25 MG tablet Take 25 mg by mouth daily.     cholecalciferol (VITAMIN D3) 25 MCG (1000 UNIT) tablet Take 1,000 Units by mouth daily with supper.     clopidogrel (PLAVIX) 75 MG tablet Take 1 tablet (75 mg total) by mouth daily at 6 (six) AM. 30 tablet 11   Coenzyme Q10 (CO Q-10) 100 MG CAPS Take 200 mg by mouth daily with supper.     colchicine 0.6 MG tablet Take 0.6 mg by mouth as needed.     doxycycline (VIBRAMYCIN) 100 MG capsule Take 1 capsule (100 mg total) by mouth 2 (two) times daily. 28 capsule 0   Febuxostat 80 MG TABS Take 80 mg by mouth daily with supper.     fluticasone (FLONASE) 50 MCG/ACT nasal spray Place 1 spray into both nostrils in the morning and at bedtime. (Patient not taking: Reported on 09/21/2023)     glucose blood test strip Use as instructed 100  each 12   HYDROcodone-acetaminophen (NORCO) 5-325 MG tablet Take 1 tablet by mouth every 6 (six) hours as needed for moderate pain (pain score 4-6). 30 tablet 0   levothyroxine (SYNTHROID) 75 MCG tablet Take 75 mcg by mouth daily before breakfast.     magnesium oxide (MAG-OX) 400 MG tablet Take 400 mg by mouth daily.     metFORMIN (GLUCOPHAGE) 500 MG tablet Take 2 tablets (1,000 mg total) by mouth 2 (two) times daily with a meal. (Patient taking differently: Take 500 mg by mouth 2 (two) times daily with a meal.) 360 tablet 3   MOUNJARO 7.5 MG/0.5ML Pen Inject 7.5 mg into the skin every Monday.     Omega-3 Fatty Acids (FISH OIL) 1200 MG CAPS Take 1,200 mg by mouth 2 (two) times daily.     pantoprazole (PROTONIX) 40 MG tablet Take 40 mg by mouth daily.     pregabalin (LYRICA) 50 MG capsule Take 50 mg by mouth daily.     rosuvastatin (CRESTOR) 10 MG tablet Take 1 tablet (10 mg  total) by mouth daily. (Patient taking differently: Take 10 mg by mouth daily with supper.) 90 tablet 3   telmisartan (MICARDIS) 80 MG tablet Take 80 mg by mouth daily with supper.     torsemide (DEMADEX) 20 MG tablet Take 20 mg by mouth once.     No current facility-administered medications for this visit.        Physical Exam There were no vitals taken for this visit. Gen:  WD/WN, NAD Skin: incision right groin wound is granulating base and superficial.  Pedal pulses are nonpalpable     Assessment/Plan: 1. Atherosclerosis of native arteries of the extremities with ulceration (HCC) Currently the patient is doing well.  My hope is in 2 weeks we will be able to DC the [facility] his wound does appear to be superficial.  Overall he seems to be improved with respect to his arterial perfusion of his foot and he is no longer having pain.  He will follow-up in 2 weeks with no studies.      [name] 10/06/2023, 9:42 PM   This note was created with Dragon medical transcription system.  Any errors from dictation are unintentional.

## 2023-10-07 ENCOUNTER — Ambulatory Visit (INDEPENDENT_AMBULATORY_CARE_PROVIDER_SITE_OTHER): Payer: Medicare PPO | Admitting: Vascular Surgery

## 2023-10-07 ENCOUNTER — Encounter (INDEPENDENT_AMBULATORY_CARE_PROVIDER_SITE_OTHER): Payer: Self-pay | Admitting: Vascular Surgery

## 2023-10-07 ENCOUNTER — Ambulatory Visit (INDEPENDENT_AMBULATORY_CARE_PROVIDER_SITE_OTHER): Payer: Medicare PPO

## 2023-10-07 VITALS — BP 123/77 | HR 87 | Resp 18 | Ht 67.0 in | Wt 219.2 lb

## 2023-10-07 DIAGNOSIS — I739 Peripheral vascular disease, unspecified: Secondary | ICD-10-CM | POA: Diagnosis not present

## 2023-10-07 DIAGNOSIS — Z9889 Other specified postprocedural states: Secondary | ICD-10-CM

## 2023-10-07 DIAGNOSIS — I7025 Atherosclerosis of native arteries of other extremities with ulceration: Secondary | ICD-10-CM

## 2023-10-07 LAB — VAS US ABI WITH/WO TBI
Left ABI: 0.97
Right ABI: 0.73

## 2023-10-10 ENCOUNTER — Encounter (INDEPENDENT_AMBULATORY_CARE_PROVIDER_SITE_OTHER): Payer: Self-pay | Admitting: Vascular Surgery

## 2023-10-20 NOTE — Progress Notes (Unsigned)
Patient ID: Juan Watson, male   DOB: 07/03/1951, 72 y.o.   MRN: [id number]  No chief complaint on file.   HPI Juan Watson is a 72 y.o. male.    PROCEDURE 09/24/2023: 1.  Excisional debridement of skin and soft tissue right groin. 2.  Application of a VAC wound dressing.   PROCEDURE 08/18/2023: 1.  Right common femoral artery to peroneal artery bypass with in-situ saphenous vein graft. 2.  Right common femoral endarterectomy with repair using the hood of the great saphenous vein in situ bypass 3.  Separate and distinct endarterectomy of the right profunda femoris artery with bovine pericardial patch. 4.  Right popliteal and anterior tibial artery endarterectomy with primary repair 5.  Right tibioperoneal trunk and peroneal endarterectomy.   Today he denies pain he feels his right leg is better.  No specific right groin complaints.     Previous ABIs obtained today Rt=0.71 biphasic (TBI 1.02) and Lt=1.02.  Toe tracings are now within the normal range and significantly improved compared to last study   Past Medical History:  Diagnosis Date   Acquired flat foot 11/15/2015   Arthritis of knee, degenerative 04/15/2015   BPH (benign prostatic hyperplasia)    Chewing tobacco nicotine dependence without complication 08/10/2020   Cholecystitis 06/28/2017   Coronary artery disease    Diabetes mellitus without complication (HCC)    Diabetes mellitus, type 2 (HCC) 12/13/2002   Overview:  DIET CONTROLLED    Disease of nail 07/16/2011   Encounter for screening for malignant neoplasm of prostate 10/29/2014   Essential (primary) hypertension 10/17/2010   Familial aortic aneurysm 03/26/2014   Family history of cardiovascular disease 03/26/2014   Gastro-esophageal reflux disease without esophagitis 08/11/2013   Generalized OA 08/11/2013   Gout 03/26/2014   Heart murmur    HOH (hard of hearing)    aids   Motion sickness    "sea sick"   Peripheral vascular disease (HCC)    Primary  osteoarthritis of right knee 04/14/2015   Pure hypercholesterolemia 12/13/2002   Skin lesion 03/11/2015    Past Surgical History:  Procedure Laterality Date   APPLICATION OF WOUND VAC Right 09/24/2023   Procedure: APPLICATION OF WOUND VAC;  Surgeon: Renford Dills, MD;  Location: [facility];  Service: Vascular;  Laterality: Right;   BACK SURGERY  77,87   CARDIAC CATHETERIZATION  70's   pericarditis   CATARACT EXTRACTION W/PHACO Right 03/23/2016   Procedure: CATARACT EXTRACTION PHACO AND INTRAOCULAR LENS PLACEMENT (IOC);  Surgeon: Sallee Lange, MD;  Location: [facility];  Service: Ophthalmology;  Laterality: Right;  [place] 01:31    CATARACT EXTRACTION W/PHACO Left 10/23/2019   Procedure: CATARACT EXTRACTION PHACO AND INTRAOCULAR LENS PLACEMENT (IOC) LEFT DIABETIC TORIC LENS 5.82,   00:44.1;  Surgeon: Nevada Crane, MD;  Location: [facility];  Service: Ophthalmology;  Laterality: Left;  Diabetic - oral meds   CHOLECYSTECTOMY N/A 06/29/2017   Procedure: LAPAROSCOPIC CHOLECYSTECTOMY;  Surgeon: Henrene Dodge, MD;  Location: [facility];  Service: General;  Laterality: N/A;   COLONOSCOPY WITH PROPOFOL N/A 10/06/2017   Procedure: COLONOSCOPY WITH PROPOFOL;  Surgeon: Scot Jun, MD;  Location: [facility];  Service: Endoscopy;  Laterality: N/A;   CORONARY STENT INTERVENTION N/A 04/11/2020   Procedure: CORONARY STENT INTERVENTION;  Surgeon: Alwyn Pea, MD;  Location: [facility];  Service: Cardiovascular;  Laterality: N/A;   ENDARTERECTOMY FEMORAL Right 08/18/2023   Procedure: ENDARTERECTOMY FEMORAL;  Surgeon: Renford Dills, MD;  Location: [facility];  Service: Vascular;  Laterality: Right;   ENDARTERECTOMY POPLITEAL Right 08/18/2023   Procedure: ENDARTERECTOMY POPLITEAL;  Surgeon: Renford Dills, MD;  Location: [facility];  Service: Vascular;  Laterality: Right;   ENDARTERECTOMY TIBIOPERONEAL Right 08/18/2023   Procedure: ENDARTERECTOMY TIBIOPERONEAL;   Surgeon: Renford Dills, MD;  Location: [facility];  Service: Vascular;  Laterality: Right;   FEMORAL-TIBIAL BYPASS GRAFT Right 08/18/2023   Procedure: BYPASS GRAFT FEMORAL-TIBIAL ARTERY (WITH VEIN);  Surgeon: Renford Dills, MD;  Location: [facility];  Service: Vascular;  Laterality: Right;   HERNIA REPAIR  11,   umbilical x2   KNEE ARTHROSCOPY Right 12/20/2017   Procedure: ARTHROSCOPY KNEE EXTENSIVE SYNOVECTOMY AND LYSIS OF ADHESIONS;  Surgeon: Donato Heinz, MD;  Location: [facility];  Service: Orthopedics;  Laterality: Right;   KNEE DEBRIDEMENT Left    tendon   LEFT HEART CATH AND CORONARY ANGIOGRAPHY Left 04/11/2020   Procedure: LEFT HEART CATH AND CORONARY ANGIOGRAPHY;  Surgeon: Lamar Blinks, MD;  Location: [facility];  Service: Cardiovascular;  Laterality: Left;   LOWER EXTREMITY ANGIOGRAPHY Left 09/24/2020   Procedure: LOWER EXTREMITY ANGIOGRAPHY;  Surgeon: Renford Dills, MD;  Location: [facility];  Service: Cardiovascular;  Laterality: Left;   LOWER EXTREMITY ANGIOGRAPHY Right 07/13/2023   Procedure: Lower Extremity Angiography;  Surgeon: Renford Dills, MD;  Location: [facility];  Service: Cardiovascular;  Laterality: Right;   TONSILLECTOMY  72   TOTAL KNEE ARTHROPLASTY Right 04/15/2015   Procedure: TOTAL KNEE ARTHROPLASTY;  Surgeon: Gean Birchwood, MD;  Location: [facility];  Service: Orthopedics;  Laterality: Right;   TRANSURETHRAL RESECTION OF PROSTATE  ?   WOUND DEBRIDEMENT Right 09/24/2023   Procedure: DEBRIDEMENT WOUND;  Surgeon: Renford Dills, MD;  Location: [facility];  Service: Vascular;  Laterality: Right;      Allergies  Allergen Reactions   Bee Venom Shortness Of Breath and Swelling   Sulfa Antibiotics Swelling    Tongue swelling   Ben Gay Ultra [Menthol] Other (See Comments)    Whelts and redness of skin    Current Outpatient Medications  Medication Sig Dispense Refill   acetaminophen (TYLENOL) 500 MG tablet Take 1,000  mg by mouth every 6 (six) hours as needed for mild pain or headache.     ascorbic acid (VITAMIN C) 500 MG tablet Take 500 mg by mouth daily with supper.     aspirin EC 81 MG tablet Take 81 mg by mouth at bedtime.     chlorthalidone (HYGROTON) 25 MG tablet Take 25 mg by mouth daily.     cholecalciferol (VITAMIN D3) 25 MCG (1000 UNIT) tablet Take 1,000 Units by mouth daily with supper.     clopidogrel (PLAVIX) 75 MG tablet Take 1 tablet (75 mg total) by mouth daily at 6 (six) AM. 30 tablet 11   Coenzyme Q10 (CO Q-10) 100 MG CAPS Take 200 mg by mouth daily with supper.     colchicine 0.6 MG tablet Take 0.6 mg by mouth as needed.     doxycycline (VIBRAMYCIN) 100 MG capsule Take 1 capsule (100 mg total) by mouth 2 (two) times daily. 28 capsule 0   Febuxostat 80 MG TABS Take 80 mg by mouth daily with supper.     fluticasone (FLONASE) 50 MCG/ACT nasal spray Place 1 spray into both nostrils in the morning and at bedtime.     glucose blood test strip Use as instructed 100 each 12   HYDROcodone-acetaminophen (NORCO)  5-325 MG tablet Take 1 tablet by mouth every 6 (six) hours as needed for moderate pain (pain score 4-6). 30 tablet 0   levothyroxine (SYNTHROID) 75 MCG tablet Take 75 mcg by mouth daily before breakfast.     magnesium oxide (MAG-OX) 400 MG tablet Take 400 mg by mouth daily.     metFORMIN (GLUCOPHAGE) 500 MG tablet Take 2 tablets (1,000 mg total) by mouth 2 (two) times daily with a meal. (Patient taking differently: Take 500 mg by mouth 2 (two) times daily with a meal.) 360 tablet 3   MOUNJARO 7.5 MG/0.5ML Pen Inject 7.5 mg into the skin every Monday.     Omega-3 Fatty Acids (FISH OIL) 1200 MG CAPS Take 1,200 mg by mouth 2 (two) times daily.     pantoprazole (PROTONIX) 40 MG tablet Take 40 mg by mouth daily.     pregabalin (LYRICA) 50 MG capsule Take 50 mg by mouth daily.     rosuvastatin (CRESTOR) 10 MG tablet Take 1 tablet (10 mg total) by mouth daily. (Patient taking differently: Take 10 mg  by mouth daily with supper.) 90 tablet 3   telmisartan (MICARDIS) 80 MG tablet Take 80 mg by mouth daily with supper.     torsemide (DEMADEX) 20 MG tablet Take 20 mg by mouth once.     No current facility-administered medications for this visit.        Physical Exam There were no vitals taken for this visit. Gen:  WD/WN, NAD Skin: incision C/D the right groin is now nearly healed and very superficial.     Assessment/Plan: 1. Atherosclerosis of native artery of both lower extremities with rest pain (HCC) The patient's right groin wound is significantly improved it is nearly healed very superficial.  Will DC the VAC and transition to a silver alginate.  He will follow-up with me in 1 month      [name] 10/20/2023, 7:49 AM   This note was created with Dragon medical transcription system.  Any errors from dictation are unintentional.

## 2023-10-21 ENCOUNTER — Encounter (INDEPENDENT_AMBULATORY_CARE_PROVIDER_SITE_OTHER): Payer: Self-pay | Admitting: Vascular Surgery

## 2023-10-21 ENCOUNTER — Ambulatory Visit (INDEPENDENT_AMBULATORY_CARE_PROVIDER_SITE_OTHER): Payer: Medicare PPO | Admitting: Vascular Surgery

## 2023-10-21 VITALS — BP 164/86 | HR 93 | Resp 16 | Wt 224.3 lb

## 2023-10-21 DIAGNOSIS — I70223 Atherosclerosis of native arteries of extremities with rest pain, bilateral legs: Secondary | ICD-10-CM

## 2023-11-08 ENCOUNTER — Telehealth (INDEPENDENT_AMBULATORY_CARE_PROVIDER_SITE_OTHER): Payer: Self-pay

## 2023-11-08 NOTE — Telephone Encounter (Signed)
Left a message on Trey Paula from [facility] voicemail that requesting orders are fine

## 2023-11-08 NOTE — Telephone Encounter (Signed)
Juan Watson from [facility] left a message requesting to change wound care orders to using hydrofera blue three times a week. Please Advise

## 2023-11-08 NOTE — Telephone Encounter (Signed)
That is fine 

## 2023-12-05 NOTE — Progress Notes (Signed)
 MRN : [id number]  Juan Watson is a 73 y.o. (26-Nov-1951) male who presents with chief complaint of check circulation.  History of Present Illness:   PROCEDURE 09/24/2023: 1.  Excisional debridement of skin and soft tissue right groin. 2.  Application of a VAC wound dressing.   PROCEDURE 08/18/2023: 1.  Right common femoral artery to peroneal artery bypass with in-situ saphenous vein graft. 2.  Right common femoral endarterectomy with repair using the hood of the great saphenous vein in situ bypass 3.  Separate and distinct endarterectomy of the right profunda femoris artery with bovine pericardial patch. 4.  Right popliteal and anterior tibial artery endarterectomy with primary repair 5.  Right tibioperoneal trunk and peroneal endarterectomy.   Today he denies pain in his foot but he is still having trouble with drop foot and numbness of the medial calf.  Overall, he feels his right leg is better.  No specific right groin complaints.     Previous ABIs obtained today Rt=0.71 biphasic (TBI 1.02) and Lt=1.02.  Toe tracings are now within the normal range and significantly improved compared to last study  No outpatient medications have been marked as taking for the 12/06/23 encounter (Appointment) with Jama, Cordella MATSU, MD.    Past Medical History:  Diagnosis Date   Acquired flat foot 11/15/2015   Arthritis of knee, degenerative 04/15/2015   BPH (benign prostatic hyperplasia)    Chewing tobacco nicotine  dependence without complication 08/10/2020   Cholecystitis 06/28/2017   Coronary artery disease    Diabetes mellitus without complication (HCC)    Diabetes mellitus, type 2 (HCC) 12/13/2002   Overview:  DIET CONTROLLED    Disease of nail 07/16/2011   Encounter for screening for malignant neoplasm of prostate 10/29/2014   Essential (primary) hypertension 10/17/2010   Familial aortic aneurysm 03/26/2014    Family history of cardiovascular disease 03/26/2014   Gastro-esophageal reflux disease without esophagitis 08/11/2013   Generalized OA 08/11/2013   Gout 03/26/2014   Heart murmur    HOH (hard of hearing)    aids   Motion sickness    sea sick   Peripheral vascular disease (HCC)    Primary osteoarthritis of right knee 04/14/2015   Pure hypercholesterolemia 12/13/2002   Skin lesion 03/11/2015    Past Surgical History:  Procedure Laterality Date   APPLICATION OF WOUND VAC Right 09/24/2023   Procedure: APPLICATION OF WOUND VAC;  Surgeon: Jama Cordella MATSU, MD;  Location: [facility];  Service: Vascular;  Laterality: Right;   BACK SURGERY  77,87   CARDIAC CATHETERIZATION  70's   pericarditis   CATARACT EXTRACTION W/PHACO Right 03/23/2016   Procedure: CATARACT EXTRACTION PHACO AND INTRAOCULAR LENS PLACEMENT (IOC);  Surgeon: Steven Dingeldein, MD;  Location: [facility];  Service: Ophthalmology;  Laterality: Right;  US  01:31    CATARACT EXTRACTION W/PHACO Left 10/23/2019   Procedure: CATARACT EXTRACTION PHACO AND INTRAOCULAR LENS PLACEMENT (IOC) LEFT DIABETIC TORIC LENS 5.82,   00:44.1;  Surgeon: Myrna Adine Anes, MD;  Location: [facility];  Service: Ophthalmology;  Laterality: Left;  Diabetic - oral meds   CHOLECYSTECTOMY N/A  06/29/2017   Procedure: LAPAROSCOPIC CHOLECYSTECTOMY;  Surgeon: Desiderio Schanz, MD;  Location: [facility];  Service: General;  Laterality: N/A;   COLONOSCOPY WITH PROPOFOL  N/A 10/06/2017   Procedure: COLONOSCOPY WITH PROPOFOL ;  Surgeon: Viktoria Lamar DASEN, MD;  Location: [facility];  Service: Endoscopy;  Laterality: N/A;   CORONARY STENT INTERVENTION N/A 04/11/2020   Procedure: CORONARY STENT INTERVENTION;  Surgeon: Florencio Cara BIRCH, MD;  Location: [facility];  Service: Cardiovascular;  Laterality: N/A;   ENDARTERECTOMY FEMORAL Right 08/18/2023   Procedure: ENDARTERECTOMY FEMORAL;  Surgeon: Jama Cordella MATSU, MD;  Location: [facility];  Service:  Vascular;  Laterality: Right;   ENDARTERECTOMY POPLITEAL Right 08/18/2023   Procedure: ENDARTERECTOMY POPLITEAL;  Surgeon: Jama Cordella MATSU, MD;  Location: [facility];  Service: Vascular;  Laterality: Right;   ENDARTERECTOMY TIBIOPERONEAL Right 08/18/2023   Procedure: ENDARTERECTOMY TIBIOPERONEAL;  Surgeon: Jama Cordella MATSU, MD;  Location: [facility];  Service: Vascular;  Laterality: Right;   FEMORAL-TIBIAL BYPASS GRAFT Right 08/18/2023   Procedure: BYPASS GRAFT FEMORAL-TIBIAL ARTERY (WITH VEIN);  Surgeon: Jama Cordella MATSU, MD;  Location: [facility];  Service: Vascular;  Laterality: Right;   HERNIA REPAIR  11,   umbilical x2   KNEE ARTHROSCOPY Right 12/20/2017   Procedure: ARTHROSCOPY KNEE EXTENSIVE SYNOVECTOMY AND LYSIS OF ADHESIONS;  Surgeon: Mardee Lynwood SQUIBB, MD;  Location: [facility];  Service: Orthopedics;  Laterality: Right;   KNEE DEBRIDEMENT Left    tendon   LEFT HEART CATH AND CORONARY ANGIOGRAPHY Left 04/11/2020   Procedure: LEFT HEART CATH AND CORONARY ANGIOGRAPHY;  Surgeon: Hester Wolm PARAS, MD;  Location: [facility];  Service: Cardiovascular;  Laterality: Left;   LOWER EXTREMITY ANGIOGRAPHY Left 09/24/2020   Procedure: LOWER EXTREMITY ANGIOGRAPHY;  Surgeon: Jama Cordella MATSU, MD;  Location: [facility];  Service: Cardiovascular;  Laterality: Left;   LOWER EXTREMITY ANGIOGRAPHY Right 07/13/2023   Procedure: Lower Extremity Angiography;  Surgeon: Jama Cordella MATSU, MD;  Location: [facility];  Service: Cardiovascular;  Laterality: Right;   TONSILLECTOMY  72   TOTAL KNEE ARTHROPLASTY Right 04/15/2015   Procedure: TOTAL KNEE ARTHROPLASTY;  Surgeon: Dempsey Sensor, MD;  Location: [facility];  Service: Orthopedics;  Laterality: Right;   TRANSURETHRAL RESECTION OF PROSTATE  ?   WOUND DEBRIDEMENT Right 09/24/2023   Procedure: DEBRIDEMENT WOUND;  Surgeon: Jama Cordella MATSU, MD;  Location: [facility];  Service: Vascular;  Laterality: Right;    Social History Social History    Tobacco Use   Smoking status: Former    Current packs/day: 0.00    Average packs/day: 2.0 packs/day for 25.0 years (50.0 ttl pk-yrs)    Types: Cigarettes    Start date: 07/16/1967    Quit date: 07/15/1992    Years since quitting: 31.4   Smokeless tobacco: Current    Types: Chew  Vaping Use   Vaping status: Never Used  Substance Use Topics   Alcohol use: No   Drug use: No    Family History Family History  Problem Relation Age of Onset   Multiple myeloma Mother    Aneurysm Father    Aneurysm Brother    Nephrolithiasis Neg Hx    Bladder Cancer Neg Hx    Prostate cancer Neg Hx    Kidney cancer Neg Hx     Allergies  Allergen Reactions   Bee Venom Shortness Of Breath and Swelling   Sulfa Antibiotics Swelling    Tongue swelling   Ben Gay Ultra [Menthol ] Other (See Comments)  Whelts and redness of skin     REVIEW OF SYSTEMS (Negative unless checked)  Constitutional: [] Weight loss  [] Fever  [] Chills Cardiac: [] Chest pain   [] Chest pressure   [] Palpitations   [] Shortness of breath when laying flat   [] Shortness of breath with exertion. Vascular:  [x] Pain in legs with walking   [] Pain in legs at rest  [] History of DVT   [] Phlebitis   [] Swelling in legs   [] Varicose veins   [] Non-healing ulcers Pulmonary:   [] Uses home oxygen   [] Productive cough   [] Hemoptysis   [] Wheeze  [] COPD   [] Asthma Neurologic:  [] Dizziness   [] Seizures   [] History of stroke   [] History of TIA  [] Aphasia   [] Vissual changes   [] Weakness or numbness in arm   [] Weakness or numbness in leg Musculoskeletal:   [] Joint swelling   [] Joint pain   [] Low back pain Hematologic:  [] Easy bruising  [] Easy bleeding   [] Hypercoagulable state   [] Anemic Gastrointestinal:  [] Diarrhea   [] Vomiting  [x] Gastroesophageal reflux/heartburn   [] Difficulty swallowing. Genitourinary:  [] Chronic kidney disease   [] Difficult urination  [] Frequent urination   [] Blood in urine Skin:  [] Rashes   [] Ulcers  Psychological:  [] History  of anxiety   []  History of major depression.  Physical Examination  There were no vitals filed for this visit. There is no height or weight on file to calculate BMI. Gen: WD/WN, NAD Head: [place]/AT, No temporalis wasting.  Ear/Nose/Throat: Hearing grossly intact, nares w/o erythema or drainage Eyes: PER, EOMI, sclera nonicteric.  Neck: Supple, no masses.  No bruit or JVD.  Pulmonary:  Good air movement, no audible wheezing, no use of accessory muscles.  Cardiac: RRR, normal S1, S2, no Murmurs. Vascular:  right groin is completely healed, no open wounds Vessel Right Left  Radial Palpable Palpable  PT Not Palpable Not Palpable  DP Not Palpable Not Palpable  Gastrointestinal: soft, non-distended. No guarding/no peritoneal signs.  Musculoskeletal: M/S 5/5 throughout.  No visible deformity.  Neurologic: CN 2-12 intact. Pain and light touch intact in extremities.  Symmetrical.  Speech is fluent. Motor exam as listed above. Psychiatric: Judgment intact, Mood & affect appropriate for pt's clinical situation. Dermatologic: No rashes or ulcers noted.  No changes consistent with cellulitis.   CBC Lab Results  Component Value Date   WBC 7.0 09/24/2023   HGB 11.4 (L) 09/24/2023   HCT 35.6 (L) 09/24/2023   MCV 80.0 09/24/2023   PLT 246 09/24/2023    BMET    Component Value Date/Time   NA 137 08/28/2023 0524   K 3.9 08/28/2023 0524   CL 104 08/28/2023 0524   CO2 25 08/28/2023 0524   GLUCOSE 135 (H) 08/28/2023 0524   BUN 9 08/28/2023 0524   CREATININE 0.92 08/28/2023 0524   CALCIUM  8.5 (L) 08/28/2023 0524   GFRNONAA >60 08/28/2023 0524   GFRAA >60 02/13/2020 1809   CrCl cannot be calculated (Patient's most recent lab result is older than the maximum 21 days allowed.).  COAG Lab Results  Component Value Date   INR 0.96 04/05/2015    Radiology No results found.   Assessment/Plan 1. Atherosclerosis of native artery of both lower extremities with intermittent claudication (HCC)  (Primary) Recommend:  The patient is status post successful bypass surgery.  The patient reports that the claudication symptoms and leg pain has improved.   The patient denies lifestyle limiting changes at this point in time.  Given the difficulty associated with the drop foot I will have  Ozell Mulch with [facility] see him.  No further invasive studies, angiography or surgery at this time. The patient should continue walking and begin a more formal exercise program.  The patient should continue antiplatelet therapy and aggressive treatment of the lipid abnormalities  Continued surveillance is indicated as atherosclerosis is likely to progress with time.    Patient should undergo noninvasive studies as ordered. The patient will follow up with me to review the studies.  - VAS US  LOWER EXTREMITY ARTERIAL DUPLEX; Future - VAS US  ABI WITH/WO TBI; Future - PT prosthetic management; Future  2. Coronary artery disease of native artery of native heart with stable angina pectoris (HCC) Continue cardiac and antihypertensive medications as already ordered and reviewed, no changes at this time.  Continue statin as ordered and reviewed, no changes at this time  Nitrates PRN for chest pain  3. Benign essential HTN Continue antihypertensive medications as already ordered, these medications have been reviewed and there are no changes at this time.  4. Gastro-esophageal reflux disease without esophagitis Continue PPI as already ordered, this medication has been reviewed and there are no changes at this time.  Avoidence of caffeine and alcohol  Moderate elevation of the head of the bed   5. Type 2 diabetes mellitus with other circulatory complication, unspecified whether long term insulin  use (HCC) Continue hypoglycemic medications as already ordered, these medications have been reviewed and there are no changes at this time.  Hgb A1C to be monitored as already arranged by primary  service    Cordella Shawl, MD  12/05/2023 2:04 PM

## 2023-12-06 ENCOUNTER — Ambulatory Visit (INDEPENDENT_AMBULATORY_CARE_PROVIDER_SITE_OTHER): Payer: Medicare PPO | Admitting: Vascular Surgery

## 2023-12-06 ENCOUNTER — Encounter (INDEPENDENT_AMBULATORY_CARE_PROVIDER_SITE_OTHER): Payer: Self-pay | Admitting: Vascular Surgery

## 2023-12-06 VITALS — BP 134/66 | HR 82 | Resp 16 | Wt 222.2 lb

## 2023-12-06 DIAGNOSIS — I70213 Atherosclerosis of native arteries of extremities with intermittent claudication, bilateral legs: Secondary | ICD-10-CM | POA: Diagnosis not present

## 2023-12-06 DIAGNOSIS — I25118 Atherosclerotic heart disease of native coronary artery with other forms of angina pectoris: Secondary | ICD-10-CM | POA: Diagnosis not present

## 2023-12-06 DIAGNOSIS — K219 Gastro-esophageal reflux disease without esophagitis: Secondary | ICD-10-CM

## 2023-12-06 DIAGNOSIS — I10 Essential (primary) hypertension: Secondary | ICD-10-CM

## 2023-12-06 DIAGNOSIS — E1159 Type 2 diabetes mellitus with other circulatory complications: Secondary | ICD-10-CM

## 2023-12-15 ENCOUNTER — Telehealth (INDEPENDENT_AMBULATORY_CARE_PROVIDER_SITE_OTHER): Payer: Self-pay

## 2023-12-15 NOTE — Telephone Encounter (Signed)
 Per  Roselind Congo- "I sent Wava Hagedorn at [place] [facility] another message  Thanks"

## 2023-12-15 NOTE — Telephone Encounter (Signed)
 Patient was seen 12/06/23 by Dr.Schnier he was calling about a referral to [facility] clinic for a device that would help him with foot drop. He stated he would like a follow up status because he hasn't heard anything from [facility] or [facility].   Please advise

## 2024-01-15 NOTE — Progress Notes (Unsigned)
MRN : [id number]  Juan Watson is a 73 y.o. (November 24, 1951) male who presents with chief complaint of check circulation.  History of Present Illness:   Patient returns to the office for follow-up regarding his atherosclerotic occlusive disease.  PROCEDURE 09/24/2023: 1.  Excisional debridement of skin and soft tissue right groin. 2.  Application of a VAC wound dressing.   PROCEDURE 08/18/2023: 1.  Right common femoral artery to peroneal artery bypass with in-situ saphenous vein graft. 2.  Right common femoral endarterectomy with repair using the hood of the great saphenous vein in situ bypass 3.  Separate and distinct endarterectomy of the right profunda femoris artery with bovine pericardial patch. 4.  Right popliteal and anterior tibial artery endarterectomy with primary repair 5.  Right tibioperoneal trunk and peroneal endarterectomy.   Today he denies pain in his foot but he is still having trouble with drop foot and numbness of the medial calf.  Overall, he feels his right leg is better.  No specific right groin complaints.     Previous ABIs obtained today Rt=0.71 biphasic (TBI 1.02) and Lt=1.02.  Toe tracings are now within the normal range and significantly improved compared to last study   No outpatient medications have been marked as taking for the 01/17/24 encounter (Appointment) with Gilda Crease, Latina Craver, MD.    Past Medical History:  Diagnosis Date   Acquired flat foot 11/15/2015   Arthritis of knee, degenerative 04/15/2015   BPH (benign prostatic hyperplasia)    Chewing tobacco nicotine dependence without complication 08/10/2020   Cholecystitis 06/28/2017   Coronary artery disease    Diabetes mellitus without complication (HCC)    Diabetes mellitus, type 2 (HCC) 12/13/2002   Overview:  DIET CONTROLLED    Disease of nail 07/16/2011   Encounter for screening for malignant neoplasm of prostate  10/29/2014   Essential (primary) hypertension 10/17/2010   Familial aortic aneurysm 03/26/2014   Family history of cardiovascular disease 03/26/2014   Gastro-esophageal reflux disease without esophagitis 08/11/2013   Generalized OA 08/11/2013   Gout 03/26/2014   Heart murmur    HOH (hard of hearing)    aids   Motion sickness    "sea sick"   Peripheral vascular disease (HCC)    Primary osteoarthritis of right knee 04/14/2015   Pure hypercholesterolemia 12/13/2002   Skin lesion 03/11/2015    Past Surgical History:  Procedure Laterality Date   APPLICATION OF WOUND VAC Right 09/24/2023   Procedure: APPLICATION OF WOUND VAC;  Surgeon: Renford Dills, MD;  Location: [facility];  Service: Vascular;  Laterality: Right;   BACK SURGERY  77,87   CARDIAC CATHETERIZATION  70's   pericarditis   CATARACT EXTRACTION W/PHACO Right 03/23/2016   Procedure: CATARACT EXTRACTION PHACO AND INTRAOCULAR LENS PLACEMENT (IOC);  Surgeon: Sallee Lange, MD;  Location: [facility];  Service: Ophthalmology;  Laterality: Right;  [place] 01:31    CATARACT EXTRACTION W/PHACO Left 10/23/2019   Procedure: CATARACT EXTRACTION PHACO AND INTRAOCULAR LENS PLACEMENT (IOC) LEFT DIABETIC TORIC LENS 5.82,   00:44.1;  Surgeon: Nevada Crane, MD;  Location: [facility];  Service: Ophthalmology;  Laterality: Left;  Diabetic - oral meds   CHOLECYSTECTOMY N/A 06/29/2017   Procedure: LAPAROSCOPIC CHOLECYSTECTOMY;  Surgeon: Henrene Dodge, MD;  Location: [facility];  Service: General;  Laterality: N/A;   COLONOSCOPY WITH PROPOFOL N/A 10/06/2017   Procedure: COLONOSCOPY WITH PROPOFOL;  Surgeon: Scot Jun, MD;  Location: [facility] ENDOSCOPY;  Service: Endoscopy;  Laterality: N/A;   CORONARY STENT INTERVENTION N/A 04/11/2020   Procedure: CORONARY STENT INTERVENTION;  Surgeon: Alwyn Pea, MD;  Location: [facility];  Service: Cardiovascular;  Laterality: N/A;   ENDARTERECTOMY FEMORAL Right 08/18/2023    Procedure: ENDARTERECTOMY FEMORAL;  Surgeon: Renford Dills, MD;  Location: [facility];  Service: Vascular;  Laterality: Right;   ENDARTERECTOMY POPLITEAL Right 08/18/2023   Procedure: ENDARTERECTOMY POPLITEAL;  Surgeon: Renford Dills, MD;  Location: [facility];  Service: Vascular;  Laterality: Right;   ENDARTERECTOMY TIBIOPERONEAL Right 08/18/2023   Procedure: ENDARTERECTOMY TIBIOPERONEAL;  Surgeon: Renford Dills, MD;  Location: [facility];  Service: Vascular;  Laterality: Right;   FEMORAL-TIBIAL BYPASS GRAFT Right 08/18/2023   Procedure: BYPASS GRAFT FEMORAL-TIBIAL ARTERY (WITH VEIN);  Surgeon: Renford Dills, MD;  Location: [facility];  Service: Vascular;  Laterality: Right;   HERNIA REPAIR  11,   umbilical x2   KNEE ARTHROSCOPY Right 12/20/2017   Procedure: ARTHROSCOPY KNEE EXTENSIVE SYNOVECTOMY AND LYSIS OF ADHESIONS;  Surgeon: Donato Heinz, MD;  Location: [facility];  Service: Orthopedics;  Laterality: Right;   KNEE DEBRIDEMENT Left    tendon   LEFT HEART CATH AND CORONARY ANGIOGRAPHY Left 04/11/2020   Procedure: LEFT HEART CATH AND CORONARY ANGIOGRAPHY;  Surgeon: Lamar Blinks, MD;  Location: [facility];  Service: Cardiovascular;  Laterality: Left;   LOWER EXTREMITY ANGIOGRAPHY Left 09/24/2020   Procedure: LOWER EXTREMITY ANGIOGRAPHY;  Surgeon: Renford Dills, MD;  Location: [facility];  Service: Cardiovascular;  Laterality: Left;   LOWER EXTREMITY ANGIOGRAPHY Right 07/13/2023   Procedure: Lower Extremity Angiography;  Surgeon: Renford Dills, MD;  Location: [facility];  Service: Cardiovascular;  Laterality: Right;   TONSILLECTOMY  72   TOTAL KNEE ARTHROPLASTY Right 04/15/2015   Procedure: TOTAL KNEE ARTHROPLASTY;  Surgeon: Gean Birchwood, MD;  Location: [facility];  Service: Orthopedics;  Laterality: Right;   TRANSURETHRAL RESECTION OF PROSTATE  ?   WOUND DEBRIDEMENT Right 09/24/2023   Procedure: DEBRIDEMENT WOUND;  Surgeon: Renford Dills,  MD;  Location: [facility];  Service: Vascular;  Laterality: Right;    Social History Social History   Tobacco Use   Smoking status: Former    Current packs/day: 0.00    Average packs/day: 2.0 packs/day for 25.0 years (50.0 ttl pk-yrs)    Types: Cigarettes    Start date: 07/16/1967    Quit date: 07/15/1992    Years since quitting: 31.5   Smokeless tobacco: Current    Types: Chew  Vaping Use   Vaping status: Never Used  Substance Use Topics   Alcohol use: No   Drug use: No    Family History Family History  Problem Relation Age of Onset   Multiple myeloma Mother    Aneurysm Father    Aneurysm Brother    Nephrolithiasis Neg Hx    Bladder Cancer Neg Hx    Prostate cancer Neg Hx    Kidney cancer Neg Hx     Allergies  Allergen Reactions   Bee Venom Shortness Of Breath and Swelling   Sulfa Antibiotics Swelling  Tongue swelling   Ben Gay Ultra [Menthol] Other (See Comments)    Whelts and redness of skin     REVIEW OF SYSTEMS (Negative unless checked)  Constitutional: [] Weight loss  [] Fever  [] Chills Cardiac: [] Chest pain   [] Chest pressure   [] Palpitations   [] Shortness of breath when laying flat   [] Shortness of breath with exertion. Vascular:  [x] Pain in legs with walking   [] Pain in legs at rest  [] History of DVT   [] Phlebitis   [] Swelling in legs   [] Varicose veins   [] Non-healing ulcers Pulmonary:   [] Uses home oxygen   [] Productive cough   [] Hemoptysis   [] Wheeze  [] COPD   [] Asthma Neurologic:  [] Dizziness   [] Seizures   [] History of stroke   [] History of TIA  [] Aphasia   [] Vissual changes   [] Weakness or numbness in arm   [] Weakness or numbness in leg Musculoskeletal:   [] Joint swelling   [] Joint pain   [] Low back pain Hematologic:  [] Easy bruising  [] Easy bleeding   [] Hypercoagulable state   [] Anemic Gastrointestinal:  [] Diarrhea   [] Vomiting  [] Gastroesophageal reflux/heartburn   [] Difficulty swallowing. Genitourinary:  [] Chronic kidney disease   [] Difficult  urination  [] Frequent urination   [] Blood in urine Skin:  [] Rashes   [] Ulcers  Psychological:  [] History of anxiety   []  History of major depression.  Physical Examination  There were no vitals filed for this visit. There is no height or weight on file to calculate BMI. Gen: WD/WN, NAD Head: [place]/AT, No temporalis wasting.  Ear/Nose/Throat: Hearing grossly intact, nares w/o erythema or drainage Eyes: PER, EOMI, sclera nonicteric.  Neck: Supple, no masses.  No bruit or JVD.  Pulmonary:  Good air movement, no audible wheezing, no use of accessory muscles.  Cardiac: RRR, normal S1, S2, no Murmurs. Vascular:  mild trophic changes, no open wounds Vessel Right Left  Radial Palpable Palpable  PT Not Palpable Not Palpable  DP Not Palpable Not Palpable  Gastrointestinal: soft, non-distended. No guarding/no peritoneal signs.  Musculoskeletal: M/S 5/5 throughout.  No visible deformity.  Neurologic: CN 2-12 intact. Pain and light touch intact in extremities.  Symmetrical.  Speech is fluent. Motor exam as listed above. Psychiatric: Judgment intact, Mood & affect appropriate for pt's clinical situation. Dermatologic: No rashes or ulcers noted.  No changes consistent with cellulitis.   CBC Lab Results  Component Value Date   WBC 7.0 09/24/2023   HGB 11.4 (L) 09/24/2023   HCT 35.6 (L) 09/24/2023   MCV 80.0 09/24/2023   PLT 246 09/24/2023    BMET    Component Value Date/Time   NA 137 08/28/2023 0524   K 3.9 08/28/2023 0524   CL 104 08/28/2023 0524   CO2 25 08/28/2023 0524   GLUCOSE 135 (H) 08/28/2023 0524   BUN 9 08/28/2023 0524   CREATININE 0.92 08/28/2023 0524   CALCIUM 8.5 (L) 08/28/2023 0524   GFRNONAA >60 08/28/2023 0524   GFRAA >60 02/13/2020 1809   CrCl cannot be calculated (Patient's most recent lab result is older than the maximum 21 days allowed.).  COAG Lab Results  Component Value Date   INR 0.96 04/05/2015    Radiology No results  found.   Assessment/Plan There are no diagnoses linked to this encounter.   [name], MD  01/15/2024 4:27 PM

## 2024-01-17 ENCOUNTER — Ambulatory Visit (INDEPENDENT_AMBULATORY_CARE_PROVIDER_SITE_OTHER): Payer: Medicare PPO

## 2024-01-17 ENCOUNTER — Ambulatory Visit (INDEPENDENT_AMBULATORY_CARE_PROVIDER_SITE_OTHER): Payer: Medicare PPO | Admitting: Vascular Surgery

## 2024-01-17 ENCOUNTER — Encounter (INDEPENDENT_AMBULATORY_CARE_PROVIDER_SITE_OTHER): Payer: Self-pay | Admitting: Vascular Surgery

## 2024-01-17 VITALS — BP 136/78 | HR 91 | Resp 18 | Ht 67.0 in | Wt 231.0 lb

## 2024-01-17 DIAGNOSIS — I70213 Atherosclerosis of native arteries of extremities with intermittent claudication, bilateral legs: Secondary | ICD-10-CM | POA: Diagnosis not present

## 2024-01-17 DIAGNOSIS — E782 Mixed hyperlipidemia: Secondary | ICD-10-CM

## 2024-01-17 DIAGNOSIS — I25118 Atherosclerotic heart disease of native coronary artery with other forms of angina pectoris: Secondary | ICD-10-CM | POA: Diagnosis not present

## 2024-01-17 DIAGNOSIS — I10 Essential (primary) hypertension: Secondary | ICD-10-CM

## 2024-01-17 DIAGNOSIS — E1159 Type 2 diabetes mellitus with other circulatory complications: Secondary | ICD-10-CM

## 2024-01-20 ENCOUNTER — Encounter (INDEPENDENT_AMBULATORY_CARE_PROVIDER_SITE_OTHER): Payer: Self-pay | Admitting: Vascular Surgery

## 2024-01-20 LAB — VAS US ABI WITH/WO TBI
Left ABI: 0.72
Right ABI: 0.61

## 2024-01-25 ENCOUNTER — Other Ambulatory Visit: Payer: Self-pay | Admitting: Podiatry

## 2024-01-28 ENCOUNTER — Other Ambulatory Visit: Payer: Self-pay

## 2024-01-28 ENCOUNTER — Encounter
Admission: RE | Admit: 2024-01-28 | Discharge: 2024-01-28 | Disposition: A | Discharge status: Home / Self Care | Payer: Medicare PPO | Source: Ambulatory Visit | Attending: Podiatry | Admitting: Podiatry

## 2024-01-28 VITALS — Ht 67.0 in | Wt 231.0 lb

## 2024-01-28 DIAGNOSIS — Z01812 Encounter for preprocedural laboratory examination: Secondary | ICD-10-CM

## 2024-01-28 DIAGNOSIS — E1159 Type 2 diabetes mellitus with other circulatory complications: Secondary | ICD-10-CM

## 2024-01-28 NOTE — Patient Instructions (Addendum)
 Your procedure is scheduled on:   Thursday March 6  Report to the Registration Desk on the 1st floor of the [facility] To find out your arrival time, please call [phone] between 1PM - 3PM on:  Wednesday March 5  If your arrival time is 6:00 am, do not arrive before that time as the [facility] entrance doors do not open until 6:00 am.  REMEMBER: Instructions that are not followed completely may result in serious medical risk, up to and including death; or upon the discretion of your surgeon and anesthesiologist your surgery may need to be rescheduled.  Do not eat food after midnight the night before surgery.  No gum chewing or hard candies.  You may however, drink WATER up to 2 hours before you are scheduled to arrive for your surgery. Do not drink anything within 2 hours of your scheduled arrival time.  In addition, your doctor has ordered for you to drink the provided:  Gatorade G2 Drinking this carbohydrate drink up to two hours before surgery helps to reduce insulin resistance and improve patient outcomes. Please complete drinking 2 hours before scheduled arrival time.  One week prior to surgery: Thursday February 27  Stop Anti-inflammatories (NSAIDS) such as Advil, Aleve, Ibuprofen, Motrin, Naproxen, Naprosyn and Aspirin based products such as Excedrin, Goody's Powder, BC Powder.  Stop ANY OVER THE COUNTER supplements until after surgery. ascorbic acid (VITAMIN C)  cholecalciferol (VITAMIN D3)  Coenzyme Q10  Omega-3 Fatty Acids (FISH OIL PO)  magnesium oxide (MAG-OX)   You may however, continue to take Tylenol if needed for pain up until the day of surgery.  **Follow guidelines for insulin and diabetes medications.** empagliflozin (JARDIANCE) hold 3 days prior to surgery, last dose Sunday March 2  metFORMIN (GLUCOPHAGE) hold 2 days prior to surgery, last dose Monday March 3   **Follow recommendations regarding stopping blood thinners.** aspirin EC 81 continue to not  take until after surgery as directed by Dr. Excell Seltzer  clopidogrel (PLAVIX) continue to not take until after surgery as directed by Dr. Excell Seltzer    Continue taking all of your other prescription medications up until the day of surgery.  ON THE DAY OF SURGERY ONLY TAKE THESE MEDICATIONS WITH SIPS OF WATER:  amoxicillin-clavulanate (AUGMENTIN)  levothyroxine (SYNTHROID)  pantoprazole (PROTONIX)  pregabalin (LYRICA)    No Alcohol for 24 hours before or after surgery.  No Smoking including e-cigarettes for 24 hours before surgery.  No chewable tobacco products for at least 6 hours before surgery.  No nicotine patches on the day of surgery.  Do not use any "recreational" drugs for at least a week (preferably 2 weeks) before your surgery.  Please be advised that the combination of cocaine and anesthesia may have negative outcomes, up to and including death. If you test positive for cocaine, your surgery will be cancelled.  On the morning of surgery brush your teeth with toothpaste and water, you may rinse your mouth with mouthwash if you wish. Do not swallow any toothpaste or mouthwash.  Do not wear jewelry, make-up, hairpins, clips or nail polish.  For welded (permanent) jewelry: bracelets, anklets, waist bands, etc.  Please have this removed prior to surgery.  If it is not removed, there is a chance that hospital personnel will need to cut it off on the day of surgery.  Do not wear lotions, powders, or perfumes.   Do not shave body hair from the neck down 48 hours before surgery.  Contact lenses, hearing aids  and dentures may not be worn into surgery.  Do not bring valuables to the hospital. [facility] is not responsible for any missing/lost belongings or valuables.   Notify your doctor if there is any change in your medical condition (cold, fever, infection).  Wear comfortable clothing (specific to your surgery type) to the hospital.  After surgery, you can help prevent lung  complications by doing breathing exercises.  Take deep breaths and cough every 1-2 hours. Your doctor may order a device called an Incentive Spirometer to help you take deep breaths.   If you are being discharged the day of surgery, you will not be allowed to drive home. You will need a responsible individual to drive you home and stay with you for 24 hours after surgery.   If you are taking public transportation, you will need to have a responsible individual with you.  Please call the Pre-admissions Testing Dept. at [phone] if you have any questions about these instructions.  Surgery Visitation Policy:  Patients having surgery or a procedure may have two visitors.  Children under the age of 75 must have an adult with them who is not the patient.  Temporary Visitor Restrictions Due to increasing cases of flu, RSV and COVID-19: Children ages 70 and under will not be able to visit patients in [facility] hospitals under most circumstances.       How to Use an Incentive Spirometer  An incentive spirometer is a tool that measures how well you are filling your lungs with each breath. Learning to take long, deep breaths using this tool can help you keep your lungs clear and active. This may help to reverse or lessen your chance of developing breathing (pulmonary) problems, especially infection. You may be asked to use a spirometer: After a surgery. If you have a lung problem or a history of smoking. After a long period of time when you have been unable to move or be active. If the spirometer includes an indicator to show the highest number that you have reached, your health care provider or respiratory therapist will help you set a goal. Keep a log of your progress as told by your health care provider. What are the risks? Breathing too quickly may cause dizziness or cause you to pass out. Take your time so you do not get dizzy or light-headed. If you are in pain, you may need to  take pain medicine before doing incentive spirometry. It is harder to take a deep breath if you are having pain. How to use your incentive spirometer  Sit up on the edge of your bed or on a chair. Hold the incentive spirometer so that it is in an upright position. Before you use the spirometer, breathe out normally. Place the mouthpiece in your mouth. Make sure your lips are closed tightly around it. Breathe in slowly and as deeply as you can through your mouth, causing the piston or the ball to rise toward the top of the chamber. Hold your breath for 3-5 seconds, or for as long as possible. If the spirometer includes a coach indicator, use this to guide you in breathing. Slow down your breathing if the indicator goes above the marked areas. Remove the mouthpiece from your mouth and breathe out normally. The piston or ball will return to the bottom of the chamber. Rest for a few seconds, then repeat the steps 10 or more times. Take your time and take a few normal breaths between deep breaths  so that you do not get dizzy or light-headed. Do this every 1-2 hours when you are awake. If the spirometer includes a goal marker to show the highest number you have reached (best effort), use this as a goal to work toward during each repetition. After each set of 10 deep breaths, cough a few times. This will help to make sure that your lungs are clear. If you have an incision on your chest or abdomen from surgery, place a pillow or a rolled-up towel firmly against the incision when you cough. This can help to reduce pain while taking deep breaths and coughing. General tips When you are able to get out of bed: Walk around often. Continue to take deep breaths and cough in order to clear your lungs. Keep using the incentive spirometer until your health care provider says it is okay to stop using it. If you have been in the hospital, you may be told to keep using the spirometer at home. Contact a health care  provider if: You are having difficulty using the spirometer. You have trouble using the spirometer as often as instructed. Your pain medicine is not giving enough relief for you to use the spirometer as told. You have a fever. Get help right away if: You develop shortness of breath. You develop a cough with bloody mucus from the lungs. You have fluid or blood coming from an incision site after you cough. Summary An incentive spirometer is a tool that can help you learn to take long, deep breaths to keep your lungs clear and active. You may be asked to use a spirometer after a surgery, if you have a lung problem or a history of smoking, or if you have been inactive for a long period of time. Use your incentive spirometer as instructed every 1-2 hours while you are awake. If you have an incision on your chest or abdomen, place a pillow or a rolled-up towel firmly against your incision when you cough. This will help to reduce pain. Get help right away if you have shortness of breath, you cough up bloody mucus, or blood comes from your incision when you cough. This information is not intended to replace advice given to you by your health care provider. Make sure you discuss any questions you have with your health care provider. Document Revised: 02/05/2020 Document Reviewed: 02/05/2020 Elsevier Patient Education  2023 [employer].

## 2024-01-31 ENCOUNTER — Encounter: Payer: Self-pay | Admitting: Urgent Care

## 2024-01-31 ENCOUNTER — Encounter
Admission: RE | Admit: 2024-01-31 | Discharge: 2024-01-31 | Disposition: A | Discharge status: Home / Self Care | Payer: Medicare PPO | Source: Ambulatory Visit | Attending: Podiatry | Admitting: Podiatry

## 2024-01-31 DIAGNOSIS — E1159 Type 2 diabetes mellitus with other circulatory complications: Secondary | ICD-10-CM | POA: Diagnosis not present

## 2024-01-31 DIAGNOSIS — Z01812 Encounter for preprocedural laboratory examination: Secondary | ICD-10-CM | POA: Diagnosis not present

## 2024-01-31 DIAGNOSIS — Z01818 Encounter for other preprocedural examination: Secondary | ICD-10-CM | POA: Diagnosis present

## 2024-01-31 LAB — CBC
HCT: 39.5 % (ref 39.0–52.0)
Hemoglobin: 12.4 g/dL — ABNORMAL LOW (ref 13.0–17.0)
MCH: 22.5 pg — ABNORMAL LOW (ref 26.0–34.0)
MCHC: 31.4 g/dL (ref 30.0–36.0)
MCV: 71.6 fL — ABNORMAL LOW (ref 80.0–100.0)
Platelets: 290 10*3/uL (ref 150–400)
RBC: 5.52 MIL/uL (ref 4.22–5.81)
RDW: 18.6 % — ABNORMAL HIGH (ref 11.5–15.5)
WBC: 8.5 10*3/uL (ref 4.0–10.5)
nRBC: 0 % (ref 0.0–0.2)

## 2024-02-01 ENCOUNTER — Other Ambulatory Visit
Admission: RE | Admit: 2024-02-01 | Discharge: 2024-02-01 | Disposition: A | Discharge status: Home / Self Care | Source: Ambulatory Visit | Attending: Internal Medicine | Admitting: Internal Medicine

## 2024-02-01 ENCOUNTER — Encounter: Payer: Self-pay | Admitting: Podiatry

## 2024-02-01 DIAGNOSIS — I2510 Atherosclerotic heart disease of native coronary artery without angina pectoris: Secondary | ICD-10-CM | POA: Diagnosis present

## 2024-02-01 LAB — BRAIN NATRIURETIC PEPTIDE: B Natriuretic Peptide: 73 pg/mL (ref 0.0–100.0)

## 2024-02-01 NOTE — Progress Notes (Signed)
 Perioperative / Anesthesia Services  Pre-Admission Testing Clinical Review / Pre-Operative Anesthesia Consult  Date: 02/01/24  Patient Demographics:  Name: Juan Watson DOB: [birth year] MRN:   [id number]  Planned Surgical Procedure(s):    Case: [id number] Date/Time: 02/03/24 0814   Procedure: AMPUTATION TOE FIRST (Right: Toe)   Anesthesia type: Choice   Pre-op diagnosis:      L97.512 - Chronic toe ulcer, right, with fat layer exposed     M10.07 - Gout, idiopathic   Location: [facility] OR ROOM 05 / [facility]   Surgeons: Rosetta Posner, DPM      NOTE: Available PAT nursing documentation and vital signs have been reviewed. Clinical nursing staff has updated patient's PMH/PSHx, current medication list, and drug allergies/intolerances to ensure comprehensive history available to assist in medical decision making as it pertains to the aforementioned surgical procedure and anticipated anesthetic course. Extensive review of available clinical information personally performed. [place] [facility] and PSHx updated with any diagnoses/procedures that  may have been inadvertently omitted during his intake with the pre-admission testing department's nursing staff.  Clinical Discussion:  Juan Watson is a 73 y.o. male who is submitted for pre-surgical anesthesia review and clearance prior to him undergoing the above procedure. Patient is a Former Smoker (50 pack years; quit 06/1992). Pertinent PMH includes: CAD, diastolic dysfunction, aortic stenosis, bradycardia, PVD, cardiac murmur, angina, HTN, HLD, T2DM, OSAH (requires nocturnal PAP therapy), gout, OA, BPH.  Patient is followed by cardiology Juliann Pares, MD). He was last seen in the cardiology clinic on 02/01/2024; notes reviewed. At the time of his clinic visit,  patient with reports of SOB and some congestion. Patient denied any chest pain,  PND, orthopnea, palpitations, significant peripheral edema, weakness, fatigue, vertiginous  symptoms, or presyncope/syncope. Patient with a past medical history significant for cardiovascular diagnoses. Documented physical exam was grossly benign, providing no evidence of acute exacerbation and/or decompensation of the patient's known cardiovascular conditions.  Patient underwent diagnostic LEFT heart catheterization on 08/07/1985.  Study revealed normal coronary anatomy with no evidence of obstructive coronary artery disease.  Myocardial perfusion imaging study was performed on 03/28/2020 revealing a low normal left ventricular systolic function with an EF of 50%.  SPECT images demonstrated a moderate reversible perfusion defect in the inferior myocardium on stress images.  Resting images revealed normal myocardial perfusion.  There were ST depressions noted inferiorly on treadmill ECG. Findings consistent with reversible inferior myocardial ischemia. study determined to be abnormal.  Patient underwent diagnostic LEFT heart catheterization on 04/11/2020 revealing multivessel CAD; 95% proximal RCA, 55% mid to distal RCA, 50% distal RCA, 60% proximal LAD, 45% proximal to mid LAD, 85% proximal LCx, 65% OM3, 45% proximal to mid LCx, 65% mid to distal LCx, and 85% D2.  Patient subsequently underwent PCI placing a 3.5 x 30 mm Resolute Onyx to the mid to distal RCA lesion and a 2.5 x 24 mm Resolute Onyx to the 85% proximal LCx lesion.  Procedure yielded excellent angiographic result and TIMI-3 flow.  Most recent TTE performed on 08/23/2023 revealed a normal left ventricular systolic function with an EF of 60 to 65%. There were no regional wall motion abnormalities. Left ventricular diastolic Doppler parameters consistent with abnormal relaxation (G1DD). Right ventricular size and function normal.  Aortic valve sclerosis/calcified.  All transvalvular gradients were noted to be normal providing no evidence suggestive of valvular stenosis. Aorta normal in size with no evidence of ectasia or aneurysmal  dilatation.  Due to patient's history of PVD  and CAD with prior PCI, patient remains on daily DAPT therapy using ASA + clopidogrel.  Patient reported be compliant with therapy with no evidence reports of GI/GU related bleeding.  Blood pressure well controlled at 124/80 mmHg on currently prescribed diuretic (chlorthalidone) and ARB (telmisartan) therapies.  Patient is on rosuvastatin for his HLD diagnosis and ASCVD prevention. T2DM well controlled on currently prescribed regimen; last HgbA1c was 6.5% when checked on 11/09/2023. He does have an OSAH diagnosis and is reported to be compliant with prescribed nocturnal PAP therapy. Patient is able to complete all of his  ADL/IADLs without cardiovascular limitation.  Per the DASI, patient is able to achieve at least 4 METS of physical activity without experiencing any significant degree of angina/anginal equivalent symptoms. No changes were made to his medication regimen during his visit with cardiology.  Patient scheduled to follow-up with outpatient cardiology in 6 months or sooner if needed.  Juan Watson is scheduled for an elective AMPUTATION TOE FIRST (Right: Toe) on 02/03/2024 with Dr. Rosetta Posner, DPM.  Given patient's past medical history significant for cardiovascular diagnoses, presurgical cardiac clearance was sought by the PAT team. Per cardiology, "this patient is optimized for surgery and may proceed with the planned procedural course with a LOW risk of significant perioperative cardiovascular complications".  Again, this patient is on daily DAPT therapy.  Patient has been instructed on recommendations for holding both his ASA and clopidogrel for 7 days prior to his procedure with plans to restart as soon as postoperative bleeding risk to be minimized by his primary attending surgeon.  Patient is aware that his last dose of his DAPT medications should be on 01/26/2024.  Patient denies previous perioperative complications with anesthesia in the  past. In review his EMR, it is noted that patient underwent a general anesthetic course here at [facility] ([facility]) in 08/2023 without documented complications.      01/28/2024    1:24 PM 01/17/2024    1:58 PM 01/17/2024    1:53 PM  Vitals with BMI  Height 5\' 7"   5\' 7"   Weight 231 lbs  231 lbs  BMI 36.17  36.17  Systolic  136 152  Diastolic  78 78  Pulse  91 78   Providers/Specialists:  NOTE: Primary physician provider listed below. Patient may have been seen by APP or partner within same practice.   PROVIDER ROLE / SPECIALTY LAST Roanna Epley, DPM Podiatry (Surgeon) 01/24/2024  Danella Penton, MD Primary Care Provider 11/09/2023  Rudean Hitt, MD Cardiology 08/03/2023  [name], MD Vascular Surgery 01/17/2024   Allergies:   Allergies  Allergen Reactions   Bee Venom Shortness Of Breath and Swelling   Sulfa Antibiotics Swelling    Tongue swelling   Ben Gay Ultra [Menthol] Other (See Comments)    Whelts and redness of skin   Current Home Medications:   No current facility-administered medications for this encounter.    acetaminophen (TYLENOL) 500 MG tablet   ascorbic acid (VITAMIN C) 500 MG tablet   aspirin EC 81 MG tablet   chlorthalidone (HYGROTON) 25 MG tablet   cholecalciferol (VITAMIN D3) 25 MCG (1000 UNIT) tablet   clopidogrel (PLAVIX) 75 MG tablet   Coenzyme Q10 (CO Q-10) 100 MG CAPS   colchicine 0.6 MG tablet   empagliflozin (JARDIANCE) 10 MG TABS tablet   Febuxostat 80 MG TABS   HYDROcodone-acetaminophen (NORCO) 5-325 MG tablet   levothyroxine (SYNTHROID) 75 MCG tablet   magnesium  oxide (MAG-OX) 400 MG tablet   metFORMIN (GLUCOPHAGE) 500 MG tablet   Omega-3 Fatty Acids (FISH OIL PO)   pantoprazole (PROTONIX) 40 MG tablet   rosuvastatin (CRESTOR) 20 MG tablet   telmisartan (MICARDIS) 80 MG tablet   glucose blood test strip   pregabalin (LYRICA) 50 MG capsule   History:   Past Medical History:   Diagnosis Date   Acquired flat foot 11/15/2015   Adhesive capsulitis of knee, right 11/01/2017   Angina pectoris (HCC) 04/05/2020   Aortic stenosis 02/03/2023   a.) TTE 02/03/2023: mild AS (MPG 11; AVA = 1.41 cm2)   Atherosclerosis of native arteries of extremity with intermittent claudication (HCC) 07/29/2020   Bilateral inguinal hernia    BPH (benign prostatic hyperplasia)    Bradycardia    Chewing tobacco nicotine dependence without complication 08/10/2020   Cholecystitis 06/28/2017   Coronary artery disease    a.) LHC 08/07/1985: normal coronaries - no CAD; b.) MV 03/28/2020: ST depressions + reversible ischemic changes in the inferior wall territory - LHC recommended; c.) LHC/PCI 04/11/2020: 95% PRCA, 55% m-dRCA (3.5 x 30 mm Resolute Onyx DES), 50% dRCA, 60% pLAD, 45% p-mLAD, 85% pLCx (2.5 x 24 mm Resolute Onyx DES), 65% OM3, 65% m-dLCx, 45% p-mLCx, 85% D2   Diastolic dysfunction    a.) TTE 11/04/2016: EF >55%, mod LVH, G1DD, mild LAE, mild MAC, triv panval regurg; b.) TTE 03/28/2020: EF >55%, mod LVH, G1DD, mile LAE, mild MAC, triv panval regurg, AoV sclerosis without stenosis; c.) TTE 02/03/2023: EF >55%, mild LVH, G1DD, triv MR/TR/PR, mild AS; d.) TTE 08/23/2023: EF 60-65%, GLDD, mild MR, AoV sclerosis without stenosis   Diet-controlled type 2 diabetes mellitus (HCC)    Disease of nail 07/16/2011   Encounter for screening for malignant neoplasm of prostate 10/29/2014   Essential (primary) hypertension 10/17/2010   Familial aortic aneurysm 03/26/2014   Family history of cardiovascular disease 03/26/2014   Gastro-esophageal reflux disease without esophagitis 08/11/2013   Generalized OA 08/11/2013   Gout 03/26/2014   Heart murmur    History of 2019 novel coronavirus disease (COVID-19) 10/2019   a.) rapid Ag (+) 10/2019; b.) PCR testing (+) 01/04/2023   HLD (hyperlipidemia)    HOH (hard of hearing); wears hearing aids    Long term current use of aspirin    Motion sickness    On  long term clopidogrel therapy    OSA on CPAP    Osteoarthritis    Peripheral vascular disease (HCC)    Skin lesion 03/11/2015   Status post bilateral cataract extraction    Past Surgical History:  Procedure Laterality Date   APPLICATION OF WOUND VAC Right 09/24/2023   Procedure: APPLICATION OF WOUND VAC;  Surgeon: Renford Dills, MD;  Location: [facility] ORS;  Service: Vascular;  Laterality: Right;   BACK SURGERY  77,87   CATARACT EXTRACTION W/PHACO Right 03/23/2016   Procedure: CATARACT EXTRACTION PHACO AND INTRAOCULAR LENS PLACEMENT (IOC);  Surgeon: Sallee Lange, MD;  Location: [facility];  Service: Ophthalmology;  Laterality: Right;  [place] 01:31    CATARACT EXTRACTION W/PHACO Left 10/23/2019   Procedure: CATARACT EXTRACTION PHACO AND INTRAOCULAR LENS PLACEMENT (IOC) LEFT DIABETIC TORIC LENS 5.82,   00:44.1;  Surgeon: Nevada Crane, MD;  Location: [facility];  Service: Ophthalmology;  Laterality: Left;  Diabetic - oral meds   CHOLECYSTECTOMY N/A 06/29/2017   Procedure: LAPAROSCOPIC CHOLECYSTECTOMY;  Surgeon: Henrene Dodge, MD;  Location: [facility];  Service: General;  Laterality: N/A;   COLONOSCOPY WITH  PROPOFOL N/A 10/06/2017   Procedure: COLONOSCOPY WITH PROPOFOL;  Surgeon: Scot Jun, MD;  Location: [facility];  Service: Endoscopy;  Laterality: N/A;   CORONARY STENT INTERVENTION N/A 04/11/2020   Procedure: CORONARY STENT INTERVENTION;  Surgeon: Alwyn Pea, MD;  Location: [facility];  Service: Cardiovascular;  Laterality: N/A;   ENDARTERECTOMY FEMORAL Right 08/18/2023   Procedure: ENDARTERECTOMY FEMORAL;  Surgeon: Renford Dills, MD;  Location: [facility];  Service: Vascular;  Laterality: Right;   ENDARTERECTOMY POPLITEAL Right 08/18/2023   Procedure: ENDARTERECTOMY POPLITEAL;  Surgeon: Renford Dills, MD;  Location: [facility];  Service: Vascular;  Laterality: Right;   ENDARTERECTOMY TIBIOPERONEAL Right 08/18/2023   Procedure:  ENDARTERECTOMY TIBIOPERONEAL;  Surgeon: Renford Dills, MD;  Location: [facility];  Service: Vascular;  Laterality: Right;   FEMORAL-TIBIAL BYPASS GRAFT Right 08/18/2023   Procedure: BYPASS GRAFT FEMORAL-TIBIAL ARTERY (WITH VEIN);  Surgeon: Renford Dills, MD;  Location: [facility];  Service: Vascular;  Laterality: Right;   HERNIA REPAIR  2011   umbilical x2   KNEE ARTHROSCOPY Right 12/20/2017   Procedure: ARTHROSCOPY KNEE EXTENSIVE SYNOVECTOMY AND LYSIS OF ADHESIONS;  Surgeon: Donato Heinz, MD;  Location: [facility];  Service: Orthopedics;  Laterality: Right;   KNEE DEBRIDEMENT Left    tendon   LEFT HEART CATH AND CORONARY ANGIOGRAPHY Left 08/07/1985   LEFT HEART CATH AND CORONARY ANGIOGRAPHY Left 04/11/2020   Procedure: LEFT HEART CATH AND CORONARY ANGIOGRAPHY;  Surgeon: Lamar Blinks, MD;  Location: [facility];  Service: Cardiovascular;  Laterality: Left;   LOWER EXTREMITY ANGIOGRAPHY Left 09/24/2020   Procedure: LOWER EXTREMITY ANGIOGRAPHY;  Surgeon: Renford Dills, MD;  Location: [facility];  Service: Cardiovascular;  Laterality: Left;   LOWER EXTREMITY ANGIOGRAPHY Right 07/13/2023   Procedure: Lower Extremity Angiography;  Surgeon: Renford Dills, MD;  Location: [facility];  Service: Cardiovascular;  Laterality: Right;   TONSILLECTOMY  1972   TOTAL KNEE ARTHROPLASTY Right 04/15/2015   Procedure: TOTAL KNEE ARTHROPLASTY;  Surgeon: Gean Birchwood, MD;  Location: [facility];  Service: Orthopedics;  Laterality: Right;   TRANSURETHRAL RESECTION OF PROSTATE  ?   WOUND DEBRIDEMENT Right 09/24/2023   Procedure: DEBRIDEMENT WOUND;  Surgeon: Renford Dills, MD;  Location: [facility];  Service: Vascular;  Laterality: Right;   Family History  Problem Relation Age of Onset   Multiple myeloma Mother    Aneurysm Father    Aneurysm Brother    Nephrolithiasis Neg Hx    Bladder Cancer Neg Hx    Prostate cancer Neg Hx    Kidney cancer Neg Hx    Social  History   Tobacco Use   Smoking status: Former    Current packs/day: 0.00    Average packs/day: 2.0 packs/day for 25.0 years (50.0 ttl pk-yrs)    Types: Cigarettes    Start date: 07/16/1967    Quit date: 07/15/1992    Years since quitting: 31.5   Smokeless tobacco: Current    Types: Chew  Substance Use Topics   Alcohol use: No   Pertinent Clinical Results:  LABS:  Lab Results  Component Value Date   WBC 8.5 01/31/2024   HGB 12.4 (L) 01/31/2024   HCT 39.5 01/31/2024   MCV 71.6 (L) 01/31/2024   PLT 290 01/31/2024   Component Ref Range & Units 11/09/2023  Glucose 70 - 110 mg/dL 161 High   Sodium 096 - 145 mmol/L 136  Potassium 3.6 - 5.1 mmol/L 4  Chloride 97 - 109 mmol/L 99  Carbon Dioxide (CO2) 22.0 - 32.0 mmol/L 29.2  Urea Nitrogen (BUN) 7 - 25 mg/dL 21  Creatinine 0.7 - 1.3 mg/dL 1  Glomerular Filtration Rate (eGFR) >60 mL/min/1.73sq m 80  Calcium 8.7 - 10.3 mg/dL 9.8  AST 8 - 39 U/L 18  ALT 6 - 57 U/L 29  Alk Phos (alkaline Phosphatase) 34 - 104 U/L 54  Albumin 3.5 - 4.8 g/dL 4.7  Bilirubin, Total 0.3 - 1.2 mg/dL 0.3  Protein, Total 6.1 - 7.9 g/dL 6.7  A/G Ratio 1.0 - 5.0 gm/dL 2.4  Resulting Agency [facility]  Specimen Collected: 11/09/23 17:07   Performed by: [facility] Last Resulted: 11/10/23 09:58  Received From: [facility]  Result Received: 12/05/23 14:03     ECG: Date: 08/03/2023  Time ECG obtained: 0858 AM Rate: 64 bpm Rhythm:  Sinus rhythm with first-degree AV block Axis (leads I and aVF): normal Intervals: PR 210 ms. QRS 92 ms. QTc 410 ms. ST segment and T wave changes: No evidence of acute T wave abnormalities or significant ST segment elevation or depression.  Evidence of a possible, age undetermined, prior infarct:  No Comparison: Similar to previous tracing obtained on 01/14/2022 NOTE: Tracing obtained at [facility]; unable for review. Above based on cardiologist's  interpretation.    IMAGING / PROCEDURES: VAS [place] ABI WITH/WO TBI performed on 01/17/2024 Resting right ankle-brachial index indicates moderate right lower extremity arterial disease. The right toe-brachial index is abnormal.  Resting left ankle-brachial index indicates moderate left lower extremity arterial disease. The left toe-brachial index is abnormal.   TRANSTHORACIC ECHOCARDIOGRAM performed on 01/22/2023 Left ventricular ejection fraction, by estimation, is 60 to 65%. The left ventricle has normal function. The left ventricle has no regional wall motion abnormalities. Left ventricular diastolic parameters are consistent with Grade I diastolic dysfunction (impaired relaxation).  Right ventricular systolic function is normal. The right ventricular size is normal.  The mitral valve is normal in structure. Mild mitral valve regurgitation. No evidence of mitral stenosis.  The aortic valve is normal in structure. Aortic valve regurgitation is not visualized. Aortic valve sclerosis is present, with no evidence of aortic valve stenosis.  The inferior vena cava is normal in size with greater than 50% respiratory variability, suggesting right atrial pressure of 3 mmHg.   LEFT HEART CATHETERIZATION AND CORONARY ANGIOGRAPHY performed on 04/11/2020 Normal left ventricular systolic function with an EF of >55% Multivessel CAD Prox RCA lesion is 95% stenosed. Mid RCA to Dist RCA lesion is 55% stenosed. Dist RCA lesion is 50% stenosed. Prox LAD lesion is 60% stenosed. Prox LAD to Mid LAD lesion is 45% stenosed. Prox Cx lesion is 85% stenosed. 3rd Mrg lesion is 65% stenosed. Mid Cx to Dist Cx lesion is 65% stenosed. Prox Cx to Mid Cx lesion is 45% stenosed. 2nd Diag lesion is 85% stenosed. Successful PCI 3.5 x 30 mm Resolute Onyx DES x 1 to the mid RCA 2.5 x 24 mm Resolute Onyx DES x 1 to the proximal LCx Procedure yielded excellent angiographic result and TIMI-3 flow   Impression and Plan:   Juan Watson has been referred for pre-anesthesia review and clearance prior to him undergoing the planned anesthetic and procedural courses. Available labs, pertinent testing, and imaging results were personally reviewed by me in preparation for upcoming operative/procedural course. [facility] medical record has been updated following extensive record review and patient interview with PAT staff.  This patient has been appropriately cleared by cardiology with an overall LOW risk of experiencing significant perioperative cardiovascular complications. Based on clinical review performed today (02/01/24), barring any significant acute changes in the patient's overall condition, it is anticipated that he will be able to proceed with the planned surgical intervention. Any acute changes in clinical condition may necessitate his procedure being postponed and/or cancelled. Patient will meet with anesthesia team (MD and/or CRNA) on the day of his procedure for preoperative evaluation/assessment. Questions regarding anesthetic course will be fielded at that time.   Pre-surgical instructions were reviewed with the patient during his PAT appointment, and questions were fielded to satisfaction by PAT clinical staff. He has been instructed on which medications that he will need to hold prior to surgery, as well as the ones that have been deemed safe/appropriate to take on the day of his procedure. As part of the general education provided by PAT, patient made aware both verbally and in writing, that he would need to abstain from the use of any illegal substances during his perioperative course. He was advised that failure to follow the provided instructions could necessitate case cancellation or result in serious perioperative complications up to and including death. Patient encouraged to contact PAT and/or his surgeon's office to discuss any questions or concerns that may arise prior to surgery; verbalized  understanding.   Quentin Mulling, MSN, APRN, FNP-C, CEN [facility]  Perioperative Services Nurse Practitioner Phone: [phone] Fax: [phone] 02/01/24 11:51 AM  NOTE: This note has been prepared using Dragon dictation software. Despite my best ability to proofread, there is always the potential that unintentional transcriptional errors may still occur from this process.

## 2024-02-03 ENCOUNTER — Encounter: Payer: Self-pay | Admitting: Podiatry

## 2024-02-03 ENCOUNTER — Ambulatory Visit: Admission: RE | Admit: 2024-02-03 | Payer: Medicare PPO | Source: Ambulatory Visit | Admitting: Podiatry

## 2024-02-03 ENCOUNTER — Encounter: Admission: RE | Payer: Self-pay | Source: Ambulatory Visit

## 2024-02-03 HISTORY — DX: Obstructive sleep apnea (adult) (pediatric): G47.33

## 2024-02-03 HISTORY — DX: Hyperlipidemia, unspecified: E78.5

## 2024-02-03 HISTORY — DX: Other ill-defined heart diseases: I51.89

## 2024-02-03 HISTORY — DX: Long term (current) use of aspirin: Z79.82

## 2024-02-03 HISTORY — DX: Long term (current) use of anticoagulants: Z79.01

## 2024-02-03 HISTORY — DX: Bradycardia, unspecified: R00.1

## 2024-02-03 HISTORY — DX: Cataract extraction status, left eye: Z98.41

## 2024-02-03 HISTORY — DX: Type 2 diabetes mellitus without complications: E11.9

## 2024-02-03 HISTORY — DX: Bilateral inguinal hernia, without obstruction or gangrene, not specified as recurrent: K40.20

## 2024-02-03 HISTORY — DX: Unspecified osteoarthritis, unspecified site: M19.90

## 2024-02-03 SURGERY — AMPUTATION, TOE
Anesthesia: Choice | Site: Toe | Laterality: Right

## 2024-04-18 ENCOUNTER — Encounter (INDEPENDENT_AMBULATORY_CARE_PROVIDER_SITE_OTHER): Payer: Self-pay

## 2024-04-20 ENCOUNTER — Ambulatory Visit (INDEPENDENT_AMBULATORY_CARE_PROVIDER_SITE_OTHER): Payer: Medicare PPO

## 2024-04-20 ENCOUNTER — Encounter (INDEPENDENT_AMBULATORY_CARE_PROVIDER_SITE_OTHER): Payer: Self-pay | Admitting: Vascular Surgery

## 2024-04-20 ENCOUNTER — Ambulatory Visit (INDEPENDENT_AMBULATORY_CARE_PROVIDER_SITE_OTHER): Payer: Medicare PPO | Admitting: Vascular Surgery

## 2024-04-20 VITALS — BP 156/80 | HR 84 | Resp 18 | Ht 67.0 in | Wt 233.0 lb

## 2024-04-20 DIAGNOSIS — I25118 Atherosclerotic heart disease of native coronary artery with other forms of angina pectoris: Secondary | ICD-10-CM

## 2024-04-20 DIAGNOSIS — I70213 Atherosclerosis of native arteries of extremities with intermittent claudication, bilateral legs: Secondary | ICD-10-CM

## 2024-04-20 DIAGNOSIS — E782 Mixed hyperlipidemia: Secondary | ICD-10-CM

## 2024-04-20 DIAGNOSIS — E1159 Type 2 diabetes mellitus with other circulatory complications: Secondary | ICD-10-CM

## 2024-04-20 DIAGNOSIS — I10 Essential (primary) hypertension: Secondary | ICD-10-CM | POA: Diagnosis not present

## 2024-04-24 ENCOUNTER — Encounter (INDEPENDENT_AMBULATORY_CARE_PROVIDER_SITE_OTHER): Payer: Self-pay | Admitting: Vascular Surgery

## 2024-04-24 NOTE — Progress Notes (Signed)
 MRN : [id number]  Juan Watson is a 73 y.o. ([birth year]) male who presents with chief complaint of check circulation.  History of Present Illness:  Patient returns to the office for follow-up regarding his atherosclerotic occlusive disease.   PROCEDURE 09/24/2023: 1.  Excisional debridement of skin and soft tissue right groin. 2.  Application of a VAC wound dressing.   PROCEDURE 08/18/2023: 1.  Right common femoral artery to peroneal artery bypass with in-situ saphenous vein graft. 2.  Right common femoral endarterectomy with repair using the hood of the great saphenous vein in situ bypass 3.  Separate and distinct endarterectomy of the right profunda femoris artery with bovine pericardial patch. 4.  Right popliteal and anterior tibial artery endarterectomy with primary repair 5.  Right tibioperoneal trunk and peroneal endarterectomy.   Today he denies pain in his foot but he is still having trouble with drop foot and numbness of the medial calf.  Overall, he feels his right leg is better.  No specific right groin complaints.     ABIs obtained today Rt=0.87 biphasic (TBI=0.61) and Lt=0.76.  Toe tracings are now within the normal range and significantly improved compared to last study (previous ABI's Rt=0.71 biphasic (TBI 1.02) and Lt=1.02.)   Duplex ultrasound of the right lower extremity demonstrates a patent bypass graft without outflow stenosis on this study.     Current Meds  Medication Sig   acetaminophen  (TYLENOL ) 500 MG tablet Take 1,000 mg by mouth every 6 (six) hours as needed for mild pain or headache.   ascorbic acid (VITAMIN C) 500 MG tablet Take 500 mg by mouth daily with supper.   aspirin  EC 81 MG tablet Take 81 mg by mouth at bedtime.   chlorthalidone  (HYGROTON ) 25 MG tablet Take 25 mg by mouth in the morning.   cholecalciferol (VITAMIN D3) 25 MCG (1000 UNIT) tablet Take 1,000 Units by mouth  daily with supper.   clopidogrel  (PLAVIX ) 75 MG tablet Take 1 tablet (75 mg total) by mouth daily at 6 (six) AM.   Coenzyme Q10 (CO Q-10) 100 MG CAPS Take 200 mg by mouth daily with supper.   colchicine 0.6 MG tablet Take 0.6 mg by mouth daily as needed (gout flares).   empagliflozin (JARDIANCE) 10 MG TABS tablet Take 10 mg by mouth in the morning.   Febuxostat  80 MG TABS Take 80 mg by mouth daily with supper.   glipiZIDE (GLUCOTROL XL) 2.5 MG 24 hr tablet Take 2.5 mg by mouth.   glucose blood test strip Use as instructed   HYDROcodone -acetaminophen  (NORCO) 5-325 MG tablet Take 1 tablet by mouth every 6 (six) hours as needed for moderate pain (pain score 4-6). (Patient taking differently: Take 1 tablet by mouth 2 (two) times daily as needed for moderate pain (pain score 4-6).)   levothyroxine  (SYNTHROID ) 75 MCG tablet Take 75 mcg by mouth daily before breakfast.   magnesium  oxide (MAG-OX) 400 MG tablet Take 400 mg by mouth in the morning.   metFORMIN  (GLUCOPHAGE ) 500 MG tablet Take 2 tablets (1,000 mg total) by mouth 2 (two) times daily with a  meal. (Patient taking differently: Take 500 mg by mouth 2 (two) times daily with a meal.)   MOUNJARO 2.5 MG/0.5ML Pen Inject 2.5 mg into the skin.   Omega-3 Fatty Acids (FISH OIL PO) Take 2,400 mg by mouth at bedtime.   pantoprazole  (PROTONIX ) 40 MG tablet Take 40 mg by mouth in the morning.   pregabalin  (LYRICA ) 50 MG capsule Take 50 mg by mouth daily.   rosuvastatin  (CRESTOR ) 20 MG tablet Take 20 mg by mouth daily with supper.   telmisartan (MICARDIS) 80 MG tablet Take 80 mg by mouth daily with supper.    Past Medical History:  Diagnosis Date   Acquired flat foot 11/15/2015   Adhesive capsulitis of knee, right 11/01/2017   Angina pectoris (HCC) 04/05/2020   Aortic stenosis 02/03/2023   a.) TTE 02/03/2023: mild AS (MPG 11; AVA = 1.41 cm2)   Atherosclerosis of native arteries of extremity with intermittent claudication (HCC) 07/29/2020   Bilateral  inguinal hernia    BPH (benign prostatic hyperplasia)    Bradycardia    Chewing tobacco nicotine  dependence without complication 08/10/2020   Cholecystitis 06/28/2017   Coronary artery disease    a.) LHC 08/07/1985: normal coronaries - no CAD; b.) MV 03/28/2020: ST depressions + reversible ischemic changes in the inferior wall territory - LHC recommended; c.) LHC/PCI 04/11/2020: 95% PRCA, 55% m-dRCA (3.5 x 30 mm Resolute Onyx DES), 50% dRCA, 60% pLAD, 45% p-mLAD, 85% pLCx (2.5 x 24 mm Resolute Onyx DES), 65% OM3, 65% m-dLCx, 45% p-mLCx, 85% D2   Diastolic dysfunction    a.) TTE 11/04/2016: EF >55%, mod LVH, G1DD, mild LAE, mild MAC, triv panval regurg; b.) TTE 03/28/2020: EF >55%, mod LVH, G1DD, mile LAE, mild MAC, triv panval regurg, AoV sclerosis without stenosis; c.) TTE 02/03/2023: EF >55%, mild LVH, G1DD, triv MR/TR/PR, mild AS; d.) TTE 08/23/2023: EF 60-65%, GLDD, mild MR, AoV sclerosis without stenosis   Diet-controlled type 2 diabetes mellitus (HCC)    Disease of nail 07/16/2011   Encounter for screening for malignant neoplasm of prostate 10/29/2014   Essential (primary) hypertension 10/17/2010   Familial aortic aneurysm 03/26/2014   Family history of cardiovascular disease 03/26/2014   Gastro-esophageal reflux disease without esophagitis 08/11/2013   Generalized OA 08/11/2013   Gout 03/26/2014   Heart murmur    History of 2019 novel coronavirus disease (COVID-19) 10/2019   a.) rapid Ag (+) 10/2019; b.) PCR testing (+) 01/04/2023   HLD (hyperlipidemia)    HOH (hard of hearing); wears hearing aids    Long term current use of aspirin     Motion sickness    On long term clopidogrel  therapy    OSA on CPAP    Osteoarthritis    Peripheral vascular disease (HCC)    Skin lesion 03/11/2015   Status post bilateral cataract extraction     Past Surgical History:  Procedure Laterality Date   APPLICATION OF WOUND VAC Right 09/24/2023   Procedure: APPLICATION OF WOUND VAC;  Surgeon:  Jackquelyn Mass, MD;  Location: [facility];  Service: Vascular;  Laterality: Right;   BACK SURGERY  77,87   CATARACT EXTRACTION W/PHACO Right 03/23/2016   Procedure: CATARACT EXTRACTION PHACO AND INTRAOCULAR LENS PLACEMENT (IOC);  Surgeon: Steven Dingeldein, MD;  Location: [facility];  Service: Ophthalmology;  Laterality: Right;  US  01:31    CATARACT EXTRACTION W/PHACO Left 10/23/2019   Procedure: CATARACT EXTRACTION PHACO AND INTRAOCULAR LENS PLACEMENT (IOC) LEFT DIABETIC TORIC LENS 5.82,   00:44.1;  Surgeon: Rosa College,  MD;  Location: [facility];  Service: Ophthalmology;  Laterality: Left;  Diabetic - oral meds   CHOLECYSTECTOMY N/A 06/29/2017   Procedure: LAPAROSCOPIC CHOLECYSTECTOMY;  Surgeon: Emmalene Hare, MD;  Location: [facility];  Service: General;  Laterality: N/A;   COLONOSCOPY WITH PROPOFOL  N/A 10/06/2017   Procedure: COLONOSCOPY WITH PROPOFOL ;  Surgeon: Cassie Click, MD;  Location: [facility];  Service: Endoscopy;  Laterality: N/A;   CORONARY STENT INTERVENTION N/A 04/11/2020   Procedure: CORONARY STENT INTERVENTION;  Surgeon: Antonette Batters, MD;  Location: [facility];  Service: Cardiovascular;  Laterality: N/A;   ENDARTERECTOMY FEMORAL Right 08/18/2023   Procedure: ENDARTERECTOMY FEMORAL;  Surgeon: Jackquelyn Mass, MD;  Location: [facility];  Service: Vascular;  Laterality: Right;   ENDARTERECTOMY POPLITEAL Right 08/18/2023   Procedure: ENDARTERECTOMY POPLITEAL;  Surgeon: Jackquelyn Mass, MD;  Location: [facility];  Service: Vascular;  Laterality: Right;   ENDARTERECTOMY TIBIOPERONEAL Right 08/18/2023   Procedure: ENDARTERECTOMY TIBIOPERONEAL;  Surgeon: Jackquelyn Mass, MD;  Location: [facility];  Service: Vascular;  Laterality: Right;   FEMORAL-TIBIAL BYPASS GRAFT Right 08/18/2023   Procedure: BYPASS GRAFT FEMORAL-TIBIAL ARTERY (WITH VEIN);  Surgeon: Jackquelyn Mass, MD;  Location: [facility];  Service: Vascular;  Laterality: Right;    HERNIA REPAIR  2011   umbilical x2   KNEE ARTHROSCOPY Right 12/20/2017   Procedure: ARTHROSCOPY KNEE EXTENSIVE SYNOVECTOMY AND LYSIS OF ADHESIONS;  Surgeon: Arlyne Lame, MD;  Location: [facility];  Service: Orthopedics;  Laterality: Right;   KNEE DEBRIDEMENT Left    tendon   LEFT HEART CATH AND CORONARY ANGIOGRAPHY Left 08/07/1985   LEFT HEART CATH AND CORONARY ANGIOGRAPHY Left 04/11/2020   Procedure: LEFT HEART CATH AND CORONARY ANGIOGRAPHY;  Surgeon: Michelle Aid, MD;  Location: [facility];  Service: Cardiovascular;  Laterality: Left;   LOWER EXTREMITY ANGIOGRAPHY Left 09/24/2020   Procedure: LOWER EXTREMITY ANGIOGRAPHY;  Surgeon: Jackquelyn Mass, MD;  Location: [facility];  Service: Cardiovascular;  Laterality: Left;   LOWER EXTREMITY ANGIOGRAPHY Right 07/13/2023   Procedure: Lower Extremity Angiography;  Surgeon: Jackquelyn Mass, MD;  Location: [facility];  Service: Cardiovascular;  Laterality: Right;   TONSILLECTOMY  1972   TOTAL KNEE ARTHROPLASTY Right 04/15/2015   Procedure: TOTAL KNEE ARTHROPLASTY;  Surgeon: Wendolyn Hamburger, MD;  Location: [facility];  Service: Orthopedics;  Laterality: Right;   TRANSURETHRAL RESECTION OF PROSTATE  ?   WOUND DEBRIDEMENT Right 09/24/2023   Procedure: DEBRIDEMENT WOUND;  Surgeon: Jackquelyn Mass, MD;  Location: [facility];  Service: Vascular;  Laterality: Right;    Social History Social History   Tobacco Use   Smoking status: Former    Current packs/day: 0.00    Average packs/day: 2.0 packs/day for 25.0 years (50.0 ttl pk-yrs)    Types: Cigarettes    Start date: 07/16/1967    Quit date: 07/15/1992    Years since quitting: 31.7   Smokeless tobacco: Current    Types: Chew  Vaping Use   Vaping status: Never Used  Substance Use Topics   Alcohol use: No   Drug use: No    Family History Family History  Problem Relation Age of Onset   Multiple myeloma Mother    Aneurysm Father    Aneurysm Brother     Nephrolithiasis Neg Hx    Bladder Cancer Neg Hx    Prostate cancer Neg Hx    Kidney cancer Neg Hx     Allergies  Allergen  Reactions   Bee Venom Shortness Of Breath and Swelling   Sulfa Antibiotics Swelling    Tongue swelling   Ben Gay Ultra [Menthol ] Other (See Comments)    Whelts and redness of skin     REVIEW OF SYSTEMS (Negative unless checked)  Constitutional: [] Weight loss  [] Fever  [] Chills Cardiac: [] Chest pain   [] Chest pressure   [] Palpitations   [] Shortness of breath when laying flat   [] Shortness of breath with exertion. Vascular:  [x] Pain in legs with walking   [] Pain in legs at rest  [] History of DVT   [] Phlebitis   [] Swelling in legs   [] Varicose veins   [] Non-healing ulcers Pulmonary:   [] Uses home oxygen   [] Productive cough   [] Hemoptysis   [] Wheeze  [] COPD   [] Asthma Neurologic:  [] Dizziness   [] Seizures   [] History of stroke   [] History of TIA  [] Aphasia   [] Vissual changes   [] Weakness or numbness in arm   [] Weakness or numbness in leg Musculoskeletal:   [] Joint swelling   [] Joint pain   [] Low back pain Hematologic:  [] Easy bruising  [] Easy bleeding   [] Hypercoagulable state   [] Anemic Gastrointestinal:  [] Diarrhea   [] Vomiting  [] Gastroesophageal reflux/heartburn   [] Difficulty swallowing. Genitourinary:  [] Chronic kidney disease   [] Difficult urination  [] Frequent urination   [] Blood in urine Skin:  [] Rashes   [] Ulcers  Psychological:  [] History of anxiety   []  History of major depression.  Physical Examination  Vitals:   04/20/24 1541  BP: (!) 156/80  Pulse: 84  Resp: 18  Weight: 233 lb (105.7 kg)  Height: 5\' 7"  (1.702 m)   Body mass index is 36.49 kg/m. Gen: WD/WN, NAD Head: [place]/AT, No temporalis wasting.  Ear/Nose/Throat: Hearing grossly intact, nares w/o erythema or drainage Eyes: PER, EOMI, sclera nonicteric.  Neck: Supple, no masses.  No bruit or JVD.  Pulmonary:  Good air movement, no audible wheezing, no use of accessory muscles.  Cardiac:  RRR, normal S1, S2, no Murmurs. Vascular:  mild trophic changes, no open wounds Vessel Right Left  Radial Palpable Palpable  PT Not Palpable Not Palpable  DP Not Palpable Not Palpable  Gastrointestinal: soft, non-distended. No guarding/no peritoneal signs.  Musculoskeletal: M/S 5/5 throughout.  No visible deformity.  Neurologic: CN 2-12 intact. Pain and light touch intact in extremities.  Symmetrical.  Speech is fluent. Motor exam as listed above. Psychiatric: Judgment intact, Mood & affect appropriate for pt's clinical situation. Dermatologic: No rashes or ulcers noted.  No changes consistent with cellulitis.   CBC Lab Results  Component Value Date   WBC 8.5 01/31/2024   HGB 12.4 (L) 01/31/2024   HCT 39.5 01/31/2024   MCV 71.6 (L) 01/31/2024   PLT 290 01/31/2024    BMET    Component Value Date/Time   NA 137 08/28/2023 0524   K 3.9 08/28/2023 0524   CL 104 08/28/2023 0524   CO2 25 08/28/2023 0524   GLUCOSE 135 (H) 08/28/2023 0524   BUN 9 08/28/2023 0524   CREATININE 0.92 08/28/2023 0524   CALCIUM  8.5 (L) 08/28/2023 0524   GFRNONAA >60 08/28/2023 0524   GFRAA >60 02/13/2020 1809   CrCl cannot be calculated (Patient's most recent lab result is older than the maximum 21 days allowed.).  COAG Lab Results  Component Value Date   INR 0.96 04/05/2015    Radiology No results found.   Assessment/Plan 1. Atherosclerosis of native artery of both lower extremities with intermittent claudication (HCC) (Primary) Recommend:  The patient is status post successful surgical bypass.  The patient reports that the claudication symptoms and leg pain has improved.   The patient denies lifestyle limiting changes at this point in time.   No further invasive studies, angiography or surgery at this time. The patient should continue walking and begin a more formal exercise program.  The patient should continue antiplatelet therapy and aggressive treatment of the lipid abnormalities    Continued surveillance is indicated as atherosclerosis is likely to progress with time.     Patient should undergo noninvasive studies as ordered. The patient will follow up with me to review the studies.   - VAS US  LOWER EXTREMITY ARTERIAL DUPLEX; Future - VAS US  ABI WITH/WO TBI; Future  2. Benign essential HTN Continue antihypertensive medications as already ordered, these medications have been reviewed and there are no changes at this time.  3. Coronary artery disease of native artery of native heart with stable angina pectoris (HCC) Continue cardiac and antihypertensive medications as already ordered and reviewed, no changes at this time.  Continue statin as ordered and reviewed, no changes at this time  Nitrates PRN for chest pain  4. Type 2 diabetes mellitus with other circulatory complication, unspecified whether long term insulin  use (HCC) Continue hypoglycemic medications as already ordered, these medications have been reviewed and there are no changes at this time.  Hgb A1C to be monitored as already arranged by primary service  5. Mixed hyperlipidemia Continue statin as ordered and reviewed, no changes at this time     Devon Fogo, MD  04/24/2024 12:36 PM

## 2024-04-25 LAB — VAS US ABI WITH/WO TBI
Left ABI: 0.76
Right ABI: 0.87

## 2024-08-07 ENCOUNTER — Encounter (INDEPENDENT_AMBULATORY_CARE_PROVIDER_SITE_OTHER): Payer: Self-pay | Admitting: Vascular Surgery

## 2024-08-07 ENCOUNTER — Ambulatory Visit (INDEPENDENT_AMBULATORY_CARE_PROVIDER_SITE_OTHER): Admitting: Vascular Surgery

## 2024-08-07 ENCOUNTER — Ambulatory Visit (INDEPENDENT_AMBULATORY_CARE_PROVIDER_SITE_OTHER)

## 2024-08-07 VITALS — BP 131/70 | HR 83 | Ht 67.0 in | Wt 229.5 lb

## 2024-08-07 DIAGNOSIS — I70213 Atherosclerosis of native arteries of extremities with intermittent claudication, bilateral legs: Secondary | ICD-10-CM | POA: Diagnosis not present

## 2024-08-07 DIAGNOSIS — I10 Essential (primary) hypertension: Secondary | ICD-10-CM

## 2024-08-07 DIAGNOSIS — E1159 Type 2 diabetes mellitus with other circulatory complications: Secondary | ICD-10-CM

## 2024-08-07 DIAGNOSIS — E782 Mixed hyperlipidemia: Secondary | ICD-10-CM

## 2024-08-07 DIAGNOSIS — I25118 Atherosclerotic heart disease of native coronary artery with other forms of angina pectoris: Secondary | ICD-10-CM

## 2024-08-09 LAB — VAS US ABI WITH/WO TBI
Left ABI: 0.96
Right ABI: 0.85

## 2024-08-10 ENCOUNTER — Encounter (INDEPENDENT_AMBULATORY_CARE_PROVIDER_SITE_OTHER): Payer: Self-pay | Admitting: Vascular Surgery

## 2024-08-10 NOTE — Progress Notes (Signed)
 MRN : [id number]  Juan Watson is a 73 y.o. (1951/01/28) male who presents with chief complaint of check circulation.  History of Present Illness:  Patient returns to the office for follow-up regarding his atherosclerotic occlusive disease.   PROCEDURE 09/24/2023: 1.  Excisional debridement of skin and soft tissue right groin. 2.  Application of a VAC wound dressing.   PROCEDURE 08/18/2023: 1.  Right common femoral artery to peroneal artery bypass with in-situ saphenous vein graft. 2.  Right common femoral endarterectomy with repair using the hood of the great saphenous vein in situ bypass 3.  Separate and distinct endarterectomy of the right profunda femoris artery with bovine pericardial patch. 4.  Right popliteal and anterior tibial artery endarterectomy with primary repair 5.  Right tibioperoneal trunk and peroneal endarterectomy.   Today he denies pain in his foot but he is still having trouble with drop foot and numbness of the medial calf.  Overall, he feels his right leg is better.  No specific right groin complaints.     ABIs obtained today Rt=0.85 biphasic (TBI=0.65) and Lt=0.96 (TBI=0.63).   (previous ABI's Rt=0.87 biphasic (TBI=0.61) and Lt=0.76).   Duplex ultrasound of the right lower extremity demonstrates a patent bypass graft without outflow stenosis on this study.  The left lower extremity arterial system demonstrates a common femoral stenosis of approximately 50 to 60% the SFA stent is patent.   No outpatient medications have been marked as taking for the 08/07/24 encounter (Office Visit) with Jama, Cordella MATSU, MD.    Past Medical History:  Diagnosis Date   Acquired flat foot 11/15/2015   Adhesive capsulitis of knee, right 11/01/2017   Angina pectoris (HCC) 04/05/2020   Aortic stenosis 02/03/2023   a.) TTE 02/03/2023: mild AS (MPG 11; AVA = 1.41 cm2)   Atherosclerosis of native arteries of  extremity with intermittent claudication (HCC) 07/29/2020   Bilateral inguinal hernia    BPH (benign prostatic hyperplasia)    Bradycardia    Chewing tobacco nicotine  dependence without complication 08/10/2020   Cholecystitis 06/28/2017   Coronary artery disease    a.) LHC 08/07/1985: normal coronaries - no CAD; b.) MV 03/28/2020: ST depressions + reversible ischemic changes in the inferior wall territory - LHC recommended; c.) LHC/PCI 04/11/2020: 95% PRCA, 55% m-dRCA (3.5 x 30 mm Resolute Onyx DES), 50% dRCA, 60% pLAD, 45% p-mLAD, 85% pLCx (2.5 x 24 mm Resolute Onyx DES), 65% OM3, 65% m-dLCx, 45% p-mLCx, 85% D2   Diastolic dysfunction    a.) TTE 11/04/2016: EF >55%, mod LVH, G1DD, mild LAE, mild MAC, triv panval regurg; b.) TTE 03/28/2020: EF >55%, mod LVH, G1DD, mile LAE, mild MAC, triv panval regurg, AoV sclerosis without stenosis; c.) TTE 02/03/2023: EF >55%, mild LVH, G1DD, triv MR/TR/PR, mild AS; d.) TTE 08/23/2023: EF 60-65%, GLDD, mild MR, AoV sclerosis without stenosis   Diet-controlled type 2 diabetes mellitus (HCC)    Disease of nail 07/16/2011   Encounter for screening for malignant neoplasm of prostate 10/29/2014   Essential (primary) hypertension 10/17/2010   Familial aortic aneurysm 03/26/2014   Family history of cardiovascular disease  03/26/2014   Gastro-esophageal reflux disease without esophagitis 08/11/2013   Generalized OA 08/11/2013   Gout 03/26/2014   Heart murmur    History of 2019 novel coronavirus disease (COVID-19) 10/2019   a.) rapid Ag (+) 10/2019; b.) PCR testing (+) 01/04/2023   HLD (hyperlipidemia)    HOH (hard of hearing); wears hearing aids    Long term current use of aspirin     Motion sickness    On long term clopidogrel  therapy    OSA on CPAP    Osteoarthritis    Peripheral vascular disease (HCC)    Skin lesion 03/11/2015   Status post bilateral cataract extraction     Past Surgical History:  Procedure Laterality Date   APPLICATION OF WOUND VAC  Right 09/24/2023   Procedure: APPLICATION OF WOUND VAC;  Surgeon: Jama Cordella MATSU, MD;  Location: [facility];  Service: Vascular;  Laterality: Right;   BACK SURGERY  77,87   CATARACT EXTRACTION W/PHACO Right 03/23/2016   Procedure: CATARACT EXTRACTION PHACO AND INTRAOCULAR LENS PLACEMENT (IOC);  Surgeon: Steven Dingeldein, MD;  Location: [facility];  Service: Ophthalmology;  Laterality: Right;  US  01:31    CATARACT EXTRACTION W/PHACO Left 10/23/2019   Procedure: CATARACT EXTRACTION PHACO AND INTRAOCULAR LENS PLACEMENT (IOC) LEFT DIABETIC TORIC LENS 5.82,   00:44.1;  Surgeon: Myrna Adine Anes, MD;  Location: [facility];  Service: Ophthalmology;  Laterality: Left;  Diabetic - oral meds   CHOLECYSTECTOMY N/A 06/29/2017   Procedure: LAPAROSCOPIC CHOLECYSTECTOMY;  Surgeon: Desiderio Schanz, MD;  Location: [facility];  Service: General;  Laterality: N/A;   COLONOSCOPY WITH PROPOFOL  N/A 10/06/2017   Procedure: COLONOSCOPY WITH PROPOFOL ;  Surgeon: Viktoria Lamar DASEN, MD;  Location: [facility];  Service: Endoscopy;  Laterality: N/A;   CORONARY STENT INTERVENTION N/A 04/11/2020   Procedure: CORONARY STENT INTERVENTION;  Surgeon: Florencio Cara BIRCH, MD;  Location: [facility];  Service: Cardiovascular;  Laterality: N/A;   ENDARTERECTOMY FEMORAL Right 08/18/2023   Procedure: ENDARTERECTOMY FEMORAL;  Surgeon: Jama Cordella MATSU, MD;  Location: [facility];  Service: Vascular;  Laterality: Right;   ENDARTERECTOMY POPLITEAL Right 08/18/2023   Procedure: ENDARTERECTOMY POPLITEAL;  Surgeon: Jama Cordella MATSU, MD;  Location: [facility];  Service: Vascular;  Laterality: Right;   ENDARTERECTOMY TIBIOPERONEAL Right 08/18/2023   Procedure: ENDARTERECTOMY TIBIOPERONEAL;  Surgeon: Jama Cordella MATSU, MD;  Location: [facility];  Service: Vascular;  Laterality: Right;   FEMORAL-TIBIAL BYPASS GRAFT Right 08/18/2023   Procedure: BYPASS GRAFT FEMORAL-TIBIAL ARTERY (WITH VEIN);  Surgeon: Jama Cordella MATSU, MD;   Location: [facility];  Service: Vascular;  Laterality: Right;   HERNIA REPAIR  2011   umbilical x2   KNEE ARTHROSCOPY Right 12/20/2017   Procedure: ARTHROSCOPY KNEE EXTENSIVE SYNOVECTOMY AND LYSIS OF ADHESIONS;  Surgeon: Mardee Lynwood SQUIBB, MD;  Location: [facility];  Service: Orthopedics;  Laterality: Right;   KNEE DEBRIDEMENT Left    tendon   LEFT HEART CATH AND CORONARY ANGIOGRAPHY Left 08/07/1985   LEFT HEART CATH AND CORONARY ANGIOGRAPHY Left 04/11/2020   Procedure: LEFT HEART CATH AND CORONARY ANGIOGRAPHY;  Surgeon: Hester Wolm PARAS, MD;  Location: [facility];  Service: Cardiovascular;  Laterality: Left;   LOWER EXTREMITY ANGIOGRAPHY Left 09/24/2020   Procedure: LOWER EXTREMITY ANGIOGRAPHY;  Surgeon: Jama Cordella MATSU, MD;  Location: [facility];  Service: Cardiovascular;  Laterality: Left;   LOWER EXTREMITY ANGIOGRAPHY Right 07/13/2023   Procedure: Lower Extremity Angiography;  Surgeon: Jama Cordella MATSU, MD;  Location: [facility];  Service: Cardiovascular;  Laterality: Right;   TONSILLECTOMY  1972   TOTAL KNEE ARTHROPLASTY Right 04/15/2015   Procedure: TOTAL KNEE ARTHROPLASTY;  Surgeon: Dempsey Sensor, MD;  Location: [facility];  Service: Orthopedics;  Laterality: Right;   TRANSURETHRAL RESECTION OF PROSTATE  ?   WOUND DEBRIDEMENT Right 09/24/2023   Procedure: DEBRIDEMENT WOUND;  Surgeon: Jama Cordella MATSU, MD;  Location: [facility];  Service: Vascular;  Laterality: Right;    Social History Social History   Tobacco Use   Smoking status: Former    Current packs/day: 0.00    Average packs/day: 2.0 packs/day for 25.0 years (50.0 ttl pk-yrs)    Types: Cigarettes    Start date: 07/16/1967    Quit date: 07/15/1992    Years since quitting: 32.0   Smokeless tobacco: Current    Types: Chew  Vaping Use   Vaping status: Never Used  Substance Use Topics   Alcohol use: No   Drug use: No    Family History Family History  Problem Relation Age of Onset   Multiple  myeloma Mother    Aneurysm Father    Aneurysm Brother    Nephrolithiasis Neg Hx    Bladder Cancer Neg Hx    Prostate cancer Neg Hx    Kidney cancer Neg Hx     Allergies  Allergen Reactions   Bee Venom Shortness Of Breath and Swelling   Sulfa Antibiotics Swelling    Tongue swelling   Ben Gay Ultra [Menthol ] Other (See Comments)    Whelts and redness of skin     REVIEW OF SYSTEMS (Negative unless checked)  Constitutional: [] Weight loss  [] Fever  [] Chills Cardiac: [] Chest pain   [] Chest pressure   [] Palpitations   [] Shortness of breath when laying flat   [] Shortness of breath with exertion. Vascular:  [x] Pain in legs with walking   [] Pain in legs at rest  [] History of DVT   [] Phlebitis   [] Swelling in legs   [] Varicose veins   [] Non-healing ulcers Pulmonary:   [] Uses home oxygen   [] Productive cough   [] Hemoptysis   [] Wheeze  [] COPD   [] Asthma Neurologic:  [] Dizziness   [] Seizures   [] History of stroke   [] History of TIA  [] Aphasia   [] Vissual changes   [] Weakness or numbness in arm   [] Weakness or numbness in leg Musculoskeletal:   [] Joint swelling   [] Joint pain   [] Low back pain Hematologic:  [] Easy bruising  [] Easy bleeding   [] Hypercoagulable state   [] Anemic Gastrointestinal:  [] Diarrhea   [] Vomiting  [] Gastroesophageal reflux/heartburn   [] Difficulty swallowing. Genitourinary:  [] Chronic kidney disease   [] Difficult urination  [] Frequent urination   [] Blood in urine Skin:  [] Rashes   [] Ulcers  Psychological:  [] History of anxiety   []  History of major depression.  Physical Examination  Vitals:   08/07/24 1005  BP: 131/70  Pulse: 83  Weight: 229 lb 8 oz (104.1 kg)  Height: 5' 7 (1.702 m)   Body mass index is 35.94 kg/m. Gen: WD/WN, NAD Head: [place]/AT, No temporalis wasting.  Ear/Nose/Throat: Hearing grossly intact, nares w/o erythema or drainage Eyes: PER, EOMI, sclera nonicteric.  Neck: Supple, no masses.  No bruit or JVD.  Pulmonary:  Good air movement, no audible  wheezing, no use of accessory muscles.  Cardiac: RRR, normal S1, S2, no Murmurs. Vascular:  mild trophic changes, no open wounds Vessel Right Left  Radial Palpable Palpable  PT Not Palpable Not Palpable  DP Not Palpable Not Palpable  Gastrointestinal: soft, non-distended.  No guarding/no peritoneal signs.  Musculoskeletal: M/S 5/5 throughout.  No visible deformity.  Neurologic: CN 2-12 intact. Pain and light touch intact in extremities.  Symmetrical.  Speech is fluent. Motor exam as listed above. Psychiatric: Judgment intact, Mood & affect appropriate for pt's clinical situation. Dermatologic: No rashes or ulcers noted.  No changes consistent with cellulitis.   CBC Lab Results  Component Value Date   WBC 8.5 01/31/2024   HGB 12.4 (L) 01/31/2024   HCT 39.5 01/31/2024   MCV 71.6 (L) 01/31/2024   PLT 290 01/31/2024    BMET    Component Value Date/Time   NA 137 08/28/2023 0524   K 3.9 08/28/2023 0524   CL 104 08/28/2023 0524   CO2 25 08/28/2023 0524   GLUCOSE 135 (H) 08/28/2023 0524   BUN 9 08/28/2023 0524   CREATININE 0.92 08/28/2023 0524   CALCIUM  8.5 (L) 08/28/2023 0524   GFRNONAA >60 08/28/2023 0524   GFRAA >60 02/13/2020 1809   CrCl cannot be calculated (Patient's most recent lab result is older than the maximum 21 days allowed.).  COAG Lab Results  Component Value Date   INR 0.96 04/05/2015    Radiology VAS US  ABI WITH/WO TBI Result Date: 08/09/2024  LOWER EXTREMITY DOPPLER STUDY Patient Name:  Juan Watson  Date of Exam:   08/07/2024 Medical Rec #: [id number]       Accession #:    [id number] Date of Birth: [birth year]       Patient Gender: M Patient Age:   59 years Exam Location:  [facility] Procedure:      VAS US  ABI WITH/WO TBI Referring Phys: --------------------------------------------------------------------------------  Indications: Peripheral artery disease. High Risk Factors: Hypertension.  Vascular Interventions: 09/24/20: Left SFA/popliteal stent  with peroneal PTA;                         08/18/23: Right fem-peroneal in-situ BPG with popliteal,                         ATA & peroneal endarterectomies;. Comparison Study: 03/2024 Performing Technologist: Jerel Croak RVT  Examination Guidelines: A complete evaluation includes at minimum, Doppler waveform signals and systolic blood pressure reading at the level of bilateral brachial, anterior tibial, and posterior tibial arteries, when vessel segments are accessible. Bilateral testing is considered an integral part of a complete examination. Photoelectric Plethysmograph (PPG) waveforms and toe systolic pressure readings are included as required and additional duplex testing as needed. Limited examinations for reoccurring indications may be performed as noted.  ABI Findings: +---------+------------------+-----+----------+--------+ Right    Rt Pressure (mmHg)IndexWaveform  Comment  +---------+------------------+-----+----------+--------+ Brachial 127                                       +---------+------------------+-----+----------+--------+ PTA      105               0.81 monophasic         +---------+------------------+-----+----------+--------+ DP       111               0.85 biphasic           +---------+------------------+-----+----------+--------+ Great Toe85                0.65 Normal             +---------+------------------+-----+----------+--------+ +---------+------------------+-----+--------+-------+  Left     Lt Pressure (mmHg)IndexWaveformComment +---------+------------------+-----+--------+-------+ Brachial 130                                    +---------+------------------+-----+--------+-------+ PTA      124               0.95 biphasic        +---------+------------------+-----+--------+-------+ DP       125               0.96 biphasic        +---------+------------------+-----+--------+-------+ Great Toe82                0.63 Normal           +---------+------------------+-----+--------+-------+ +-------+-----------+-----------+------------+------------+ ABI/TBIToday's ABIToday's TBIPrevious ABIPrevious TBI +-------+-----------+-----------+------------+------------+ Right  .85        .65        .87         .61          +-------+-----------+-----------+------------+------------+ Left   .96        .63        .76         .71          +-------+-----------+-----------+------------+------------+ Left ABIs appear increased compared to prior study on 03/2024. Right ABIs appear essentially unchanged compared to prior study on 03/2024.  Summary: Right: Resting right ankle-brachial index indicates mild right lower extremity arterial disease. The right toe-brachial index is normal.  Left: Resting left ankle-brachial index is within normal range. The left toe-brachial index is normal.  *See table(s) above for measurements and observations.  Electronically signed by Cordella Shawl MD on 08/09/2024 at 7:31:16 AM.    Final    VAS US  LOWER EXTREMITY ARTERIAL DUPLEX Result Date: 08/09/2024 LOWER EXTREMITY ARTERIAL DUPLEX STUDY Patient Name:  Juan Watson  Date of Exam:   08/07/2024 Medical Rec #: [id number]       Accession #:    [id number] Date of Birth: 1951-09-10       Patient Gender: M Patient Age:   74 years Exam Location:  [facility] Procedure:      VAS US  LOWER EXTREMITY ARTERIAL DUPLEX Referring Phys: CORDELLA SHAWL --------------------------------------------------------------------------------  Indications: Peripheral artery disease. High Risk Factors: Hypertension.  Vascular Interventions: 09/24/20: Left SFA/popliteal stent with peroneal PTA;                         08/18/23: Right fem-peroneal in-situ BPG with popliteal,                         ATA & peroneal endarterectomies;. Current ABI:            r = .85 l = .96 Performing Technologist: Jerel Croak RVT  Examination Guidelines: A complete evaluation includes B-mode  imaging, spectral Doppler, color Doppler, and power Doppler as needed of all accessible portions of each vessel. Bilateral testing is considered an integral part of a complete examination. Limited examinations for reoccurring indications may be performed as noted.  +-----------+--------+-----+--------+----------+--------+ RIGHT      PSV cm/sRatioStenosisWaveform  Comments +-----------+--------+-----+--------+----------+--------+ CFA Mid    88                   monophasic         +-----------+--------+-----+--------+----------+--------+ DFA        30  biphasic           +-----------+--------+-----+--------+----------+--------+ ATA Distal 29                   monophasic         +-----------+--------+-----+--------+----------+--------+ PTA Distal 9                    monophasic         +-----------+--------+-----+--------+----------+--------+ PERO Distal12                   monophasic         +-----------+--------+-----+--------+----------+--------+  Right Graft #1: Fem-Pero insitu +------------------+--------+--------+--------+--------+                   PSV cm/sStenosisWaveformComments +------------------+--------+--------+--------+--------+ Inflow            67                               +------------------+--------+--------+--------+--------+ Prox Anastomosis  79                               +------------------+--------+--------+--------+--------+ Proximal Graft    71                               +------------------+--------+--------+--------+--------+ Mid Graft         109                              +------------------+--------+--------+--------+--------+ Distal Graft      49                               +------------------+--------+--------+--------+--------+ Distal Anastomosis48                               +------------------+--------+--------+--------+--------+ Outflow           40                                +------------------+--------+--------+--------+--------+   +-----------+--------+-----+---------------+----------+--------+ LEFT       PSV cm/sRatioStenosis       Waveform  Comments +-----------+--------+-----+---------------+----------+--------+ CFA Mid    254          50-74% stenosisbiphasic           +-----------+--------+-----+---------------+----------+--------+ DFA        78                          biphasic           +-----------+--------+-----+---------------+----------+--------+ SFA Prox   107                         triphasic          +-----------+--------+-----+---------------+----------+--------+ SFA Mid    51                          biphasic           +-----------+--------+-----+---------------+----------+--------+ SFA Distal 87  biphasic  stent    +-----------+--------+-----+---------------+----------+--------+ POP Distal 72                          biphasic           +-----------+--------+-----+---------------+----------+--------+ ATA Distal 76                          monophasic         +-----------+--------+-----+---------------+----------+--------+ PTA Distal 18                          monophasic         +-----------+--------+-----+---------------+----------+--------+ PERO Distal78                          monophasic         +-----------+--------+-----+---------------+----------+--------+  Summary: Right: Patent Fem-Pero insitu BPG throughout with no evidence of significant stenosis. Left: Moderate stenosis of the mid CFA. Patent SFA stent. Patent mnonophasic distal calf vessels.  See table(s) above for measurements and observations. Electronically signed by Cordella Shawl MD on 08/09/2024 at 7:30:23 AM.    Final      Assessment/Plan 1. Atherosclerosis of native artery of both lower extremities with intermittent claudication (HCC) (Primary) Recommend:   The patient is status post  successful surgical bypass.  The patient reports that the claudication symptoms and leg pain has improved.   The patient denies lifestyle limiting changes at this point in time.   No further invasive studies, angiography or surgery at this time. The patient should continue walking and begin a more formal exercise program.  The patient should continue antiplatelet therapy and aggressive treatment of the lipid abnormalities   Continued surveillance is indicated as atherosclerosis is likely to progress with time.     Patient should undergo noninvasive studies as ordered. The patient will follow up with me to review the studies.  - VAS US  ABI WITH/WO TBI; Future  2. Benign essential HTN Continue antihypertensive medications as already ordered, these medications have been reviewed and there are no changes at this time.  3. Coronary artery disease of native artery of native heart with stable angina pectoris (HCC) Continue cardiac and antihypertensive medications as already ordered and reviewed, no changes at this time.  Continue statin as ordered and reviewed, no changes at this time  Nitrates PRN for chest pain  4. Type 2 diabetes mellitus with other circulatory complication, unspecified whether long term insulin  use (HCC) Continue hypoglycemic medications as already ordered, these medications have been reviewed and there are no changes at this time.  Hgb A1C to be monitored as already arranged by primary service  5. Mixed hyperlipidemia Continue statin as ordered and reviewed, no changes at this time    Cordella Shawl, MD  08/10/2024 8:58 PM

## 2024-11-01 ENCOUNTER — Other Ambulatory Visit (INDEPENDENT_AMBULATORY_CARE_PROVIDER_SITE_OTHER): Payer: Self-pay | Admitting: Vascular Surgery

## 2024-11-01 DIAGNOSIS — Z9889 Other specified postprocedural states: Secondary | ICD-10-CM

## 2024-11-13 ENCOUNTER — Encounter (INDEPENDENT_AMBULATORY_CARE_PROVIDER_SITE_OTHER)

## 2024-11-13 ENCOUNTER — Ambulatory Visit (INDEPENDENT_AMBULATORY_CARE_PROVIDER_SITE_OTHER): Admitting: Vascular Surgery

## 2024-11-29 ENCOUNTER — Ambulatory Visit (INDEPENDENT_AMBULATORY_CARE_PROVIDER_SITE_OTHER): Admitting: Nurse Practitioner

## 2024-11-29 ENCOUNTER — Encounter (INDEPENDENT_AMBULATORY_CARE_PROVIDER_SITE_OTHER): Payer: Self-pay | Admitting: Nurse Practitioner

## 2024-11-29 ENCOUNTER — Ambulatory Visit (INDEPENDENT_AMBULATORY_CARE_PROVIDER_SITE_OTHER)

## 2024-11-29 VITALS — BP 152/66 | HR 75 | Resp 18 | Wt 229.8 lb

## 2024-11-29 DIAGNOSIS — I739 Peripheral vascular disease, unspecified: Secondary | ICD-10-CM | POA: Diagnosis not present

## 2024-11-29 DIAGNOSIS — I10 Essential (primary) hypertension: Secondary | ICD-10-CM

## 2024-11-29 DIAGNOSIS — Z9889 Other specified postprocedural states: Secondary | ICD-10-CM

## 2024-11-29 DIAGNOSIS — E782 Mixed hyperlipidemia: Secondary | ICD-10-CM

## 2024-11-29 DIAGNOSIS — E1159 Type 2 diabetes mellitus with other circulatory complications: Secondary | ICD-10-CM

## 2024-12-01 LAB — VAS US ABI WITH/WO TBI
Left ABI: 0.77
Right ABI: 0.88

## 2024-12-03 ENCOUNTER — Encounter (INDEPENDENT_AMBULATORY_CARE_PROVIDER_SITE_OTHER): Payer: Self-pay | Admitting: Nurse Practitioner

## 2024-12-03 NOTE — Progress Notes (Signed)
 "  Subjective:    Patient ID: Juan Watson, male    DOB: October 01, 1951, 74 y.o.   MRN: [id number] Chief Complaint  Patient presents with   Follow-up    3 month abi follow up    HPI  Discussed the use of AI scribe software for clinical note transcription with the patient, who gave verbal consent to proceed.  History of Present Illness Juan Watson is a 74 year old male with peripheral arterial disease and prior left lower extremity stent placement who presents for follow-up due to bilateral leg pain and right-sided foot drop.  He experiences chronic bilateral leg pain, described as aching and throbbing in the thighs with radiation to the calves and feet. Pain is provoked by standing and walking, limiting ambulation to short distances and precluding prolonged standing or employment. Sitting alleviates symptoms. Pain is symmetric in both legs.  He has right-sided foot drop, requiring an ankle-foot orthosis for ambulation and to prevent blistering. The right leg is numb and described as feeling like plastic. He is concerned about possible development of foot drop in the left leg, as he believes this would severely impair mobility. He has experienced recent falls due to foot drop, resulting in transient wrist swelling.  He denies current wounds, ulcers, or non-healing sores. There is no rest pain or sleep disturbance. He is able to walk short distances but is limited by pain. Leg cramps are infrequent. No new or worsening symptoms are noted in the left leg aside from chronic pain and numbness.  He underwent left lower extremity stent placement in 2021 for peripheral arterial disease.    Results Diagnostic Ankle-Brachial Index (ABI) (11/29/2024): Right ABI 0.85, unchanged from 0.85 on 08/07/2024; Left ABI 0.77, decreased from 0.95 on 08/07/2024   Review of Systems  All other systems reviewed and are negative.      Objective:   Physical Exam Vitals reviewed.  HENT:      Head: Normocephalic.  Cardiovascular:     Rate and Rhythm: Normal rate.     Pulses:          Dorsalis pedis pulses are detected w/ Doppler on the right side and detected w/ Doppler on the left side.       Posterior tibial pulses are detected w/ Doppler on the right side and detected w/ Doppler on the left side.  Pulmonary:     Effort: Pulmonary effort is normal.  Skin:    General: Skin is warm and dry.  Neurological:     Mental Status: He is alert and oriented to person, place, and time.  Psychiatric:        Mood and Affect: Mood normal.        Behavior: Behavior normal.        Thought Content: Thought content normal.        Judgment: Judgment normal.     Physical Exam    BP (!) 152/66 (BP Location: Left Arm)   Pulse 75   Resp 18   Wt 229 lb 12.8 oz (104.2 kg)   BMI 35.99 kg/m   Past Medical History:  Diagnosis Date   Acquired flat foot 11/15/2015   Adhesive capsulitis of knee, right 11/01/2017   Angina pectoris 04/05/2020   Aortic stenosis 02/03/2023   a.) TTE 02/03/2023: mild AS (MPG 11; AVA = 1.41 cm2)   Atherosclerosis of native arteries of extremity with intermittent claudication 07/29/2020   Bilateral inguinal hernia    BPH (benign prostatic hyperplasia)  Bradycardia    Chewing tobacco nicotine  dependence without complication 08/10/2020   Cholecystitis 06/28/2017   Coronary artery disease    a.) LHC 08/07/1985: normal coronaries - no CAD; b.) MV 03/28/2020: ST depressions + reversible ischemic changes in the inferior wall territory - LHC recommended; c.) LHC/PCI 04/11/2020: 95% PRCA, 55% m-dRCA (3.5 x 30 mm Resolute Onyx DES), 50% dRCA, 60% pLAD, 45% p-mLAD, 85% pLCx (2.5 x 24 mm Resolute Onyx DES), 65% OM3, 65% m-dLCx, 45% p-mLCx, 85% D2   Diastolic dysfunction    a.) TTE 11/04/2016: EF >55%, mod LVH, G1DD, mild LAE, mild MAC, triv panval regurg; b.) TTE 03/28/2020: EF >55%, mod LVH, G1DD, mile LAE, mild MAC, triv panval regurg, AoV sclerosis without stenosis;  c.) TTE 02/03/2023: EF >55%, mild LVH, G1DD, triv MR/TR/PR, mild AS; d.) TTE 08/23/2023: EF 60-65%, GLDD, mild MR, AoV sclerosis without stenosis   Diet-controlled type 2 diabetes mellitus (HCC)    Disease of nail 07/16/2011   Encounter for screening for malignant neoplasm of prostate 10/29/2014   Essential (primary) hypertension 10/17/2010   Familial aortic aneurysm 03/26/2014   Family history of cardiovascular disease 03/26/2014   Gastro-esophageal reflux disease without esophagitis 08/11/2013   Generalized OA 08/11/2013   Gout 03/26/2014   Heart murmur    History of 2019 novel coronavirus disease (COVID-19) 10/2019   a.) rapid Ag (+) 10/2019; b.) PCR testing (+) 01/04/2023   HLD (hyperlipidemia)    HOH (hard of hearing); wears hearing aids    Long term current use of aspirin     Motion sickness    On long term clopidogrel  therapy    OSA on CPAP    Osteoarthritis    Peripheral vascular disease    Skin lesion 03/11/2015   Status post bilateral cataract extraction     Social History   Socioeconomic History   Marital status: Married    Spouse name: Rock    Number of children: 2   Years of education: Not on file   Highest education level: Not on file  Occupational History   Occupation: Retired   Tobacco Use   Smoking status: Former    Current packs/day: 0.00    Average packs/day: 2.0 packs/day for 25.0 years (50.0 ttl pk-yrs)    Types: Cigarettes    Start date: 07/16/1967    Quit date: 07/15/1992    Years since quitting: 32.4   Smokeless tobacco: Current    Types: Chew  Vaping Use   Vaping status: Never Used  Substance and Sexual Activity   Alcohol use: No   Drug use: No   Sexual activity: Not Currently  Other Topics Concern   Not on file  Social History Narrative   Lives at home with wife private residence   Social Drivers of Health   Tobacco Use: High Risk (11/29/2024)   Patient History    Smoking Tobacco Use: Former    Smokeless Tobacco Use: Current     Passive Exposure: Not on Actuary Strain: Low Risk  (05/16/2024)   Received from [facility]   Overall Financial Resource Strain (CARDIA)    Difficulty of Paying Living Expenses: Not hard at all  Food Insecurity: No Food Insecurity (05/16/2024)   Received from [facility]   Epic    Within the past 12 months, you worried that your food would run out before you got the money to buy more.: Never true    Within the past 12 months, the food  you bought just didn't last and you didn't have money to get more.: Never true  Transportation Needs: No Transportation Needs (05/16/2024)   Received from [facility]    In the past 12 months, has lack of transportation kept you from medical appointments or from getting medications?: No    Lack of Transportation (Non-Medical): No  Physical Activity: Not on file  Stress: Not on file  Social Connections: Not on file  Intimate Partner Violence: Not At Risk (08/27/2023)   Humiliation, Afraid, Rape, and Kick questionnaire    Fear of Current or Ex-Partner: No    Emotionally Abused: No    Physically Abused: No    Sexually Abused: No  Depression (PHQ2-9): Not on file  Alcohol Screen: Not on file  Housing: Low Risk  (05/16/2024)   Received from [facility]   Epic    In the last 12 months, was there a time when you were not able to pay the mortgage or rent on time?: No    In the past 12 months, how many times have you moved where you were living?: 0    At any time in the past 12 months, were you homeless or living in a shelter (including now)?: No  Utilities: Not At Risk (05/16/2024)   Received from [facility]   Epic    In the past 12 months has the electric, gas, oil, or water company threatened to shut off services in your home?: No  Health Literacy: Not on file    Past Surgical History:  Procedure Laterality Date    APPLICATION OF WOUND VAC Right 09/24/2023   Procedure: APPLICATION OF WOUND VAC;  Surgeon: Jama Cordella MATSU, MD;  Location: [facility];  Service: Vascular;  Laterality: Right;   BACK SURGERY  77,87   CATARACT EXTRACTION W/PHACO Right 03/23/2016   Procedure: CATARACT EXTRACTION PHACO AND INTRAOCULAR LENS PLACEMENT (IOC);  Surgeon: Steven Dingeldein, MD;  Location: [facility];  Service: Ophthalmology;  Laterality: Right;  US  01:31    CATARACT EXTRACTION W/PHACO Left 10/23/2019   Procedure: CATARACT EXTRACTION PHACO AND INTRAOCULAR LENS PLACEMENT (IOC) LEFT DIABETIC TORIC LENS 5.82,   00:44.1;  Surgeon: Myrna Adine Anes, MD;  Location: [facility];  Service: Ophthalmology;  Laterality: Left;  Diabetic - oral meds   CHOLECYSTECTOMY N/A 06/29/2017   Procedure: LAPAROSCOPIC CHOLECYSTECTOMY;  Surgeon: Desiderio Schanz, MD;  Location: [facility];  Service: General;  Laterality: N/A;   COLONOSCOPY WITH PROPOFOL  N/A 10/06/2017   Procedure: COLONOSCOPY WITH PROPOFOL ;  Surgeon: Viktoria Lamar DASEN, MD;  Location: [facility];  Service: Endoscopy;  Laterality: N/A;   CORONARY STENT INTERVENTION N/A 04/11/2020   Procedure: CORONARY STENT INTERVENTION;  Surgeon: Florencio Cara BIRCH, MD;  Location: [facility];  Service: Cardiovascular;  Laterality: N/A;   ENDARTERECTOMY FEMORAL Right 08/18/2023   Procedure: ENDARTERECTOMY FEMORAL;  Surgeon: Jama Cordella MATSU, MD;  Location: [facility];  Service: Vascular;  Laterality: Right;   ENDARTERECTOMY POPLITEAL Right 08/18/2023   Procedure: ENDARTERECTOMY POPLITEAL;  Surgeon: Jama Cordella MATSU, MD;  Location: [facility];  Service: Vascular;  Laterality: Right;   ENDARTERECTOMY TIBIOPERONEAL Right 08/18/2023   Procedure: ENDARTERECTOMY TIBIOPERONEAL;  Surgeon: Jama Cordella MATSU, MD;  Location: [facility];  Service: Vascular;  Laterality: Right;   FEMORAL-TIBIAL BYPASS GRAFT Right 08/18/2023   Procedure: BYPASS GRAFT FEMORAL-TIBIAL ARTERY (WITH VEIN);  Surgeon:  Jama Cordella MATSU, MD;  Location: [facility];  Service: Vascular;  Laterality:  Right;   HERNIA REPAIR  2011   umbilical x2   KNEE ARTHROSCOPY Right 12/20/2017   Procedure: ARTHROSCOPY KNEE EXTENSIVE SYNOVECTOMY AND LYSIS OF ADHESIONS;  Surgeon: Mardee Lynwood SQUIBB, MD;  Location: [facility];  Service: Orthopedics;  Laterality: Right;   KNEE DEBRIDEMENT Left    tendon   LEFT HEART CATH AND CORONARY ANGIOGRAPHY Left 08/07/1985   LEFT HEART CATH AND CORONARY ANGIOGRAPHY Left 04/11/2020   Procedure: LEFT HEART CATH AND CORONARY ANGIOGRAPHY;  Surgeon: Hester Wolm PARAS, MD;  Location: [facility];  Service: Cardiovascular;  Laterality: Left;   LOWER EXTREMITY ANGIOGRAPHY Left 09/24/2020   Procedure: LOWER EXTREMITY ANGIOGRAPHY;  Surgeon: Jama Cordella MATSU, MD;  Location: [facility];  Service: Cardiovascular;  Laterality: Left;   LOWER EXTREMITY ANGIOGRAPHY Right 07/13/2023   Procedure: Lower Extremity Angiography;  Surgeon: Jama Cordella MATSU, MD;  Location: [facility];  Service: Cardiovascular;  Laterality: Right;   TONSILLECTOMY  1972   TOTAL KNEE ARTHROPLASTY Right 04/15/2015   Procedure: TOTAL KNEE ARTHROPLASTY;  Surgeon: Dempsey Sensor, MD;  Location: [facility];  Service: Orthopedics;  Laterality: Right;   TRANSURETHRAL RESECTION OF PROSTATE  ?   WOUND DEBRIDEMENT Right 09/24/2023   Procedure: DEBRIDEMENT WOUND;  Surgeon: Jama Cordella MATSU, MD;  Location: [facility];  Service: Vascular;  Laterality: Right;    Family History  Problem Relation Age of Onset   Multiple myeloma Mother    Aneurysm Father    Aneurysm Brother    Nephrolithiasis Neg Hx    Bladder Cancer Neg Hx    Prostate cancer Neg Hx    Kidney cancer Neg Hx     Allergies[1]     Latest Ref Rng & Units 01/31/2024    1:16 PM 09/24/2023    6:35 AM 08/28/2023    5:24 AM  CBC  WBC 4.0 - 10.5 K/uL 8.5  7.0  13.1   Hemoglobin 13.0 - 17.0 g/dL 87.5  88.5  9.8   Hematocrit 39.0 - 52.0 % 39.5  35.6  28.4    Platelets 150 - 400 K/uL 290  246  333       CMP     Component Value Date/Time   NA 137 08/28/2023 0524   K 3.9 08/28/2023 0524   CL 104 08/28/2023 0524   CO2 25 08/28/2023 0524   GLUCOSE 135 (H) 08/28/2023 0524   BUN 9 08/28/2023 0524   CREATININE 0.92 08/28/2023 0524   CALCIUM  8.5 (L) 08/28/2023 0524   PROT 6.7 08/26/2023 1930   ALBUMIN  3.8 08/26/2023 1930   AST 28 08/26/2023 1930   ALT 39 08/26/2023 1930   ALKPHOS 44 08/26/2023 1930   BILITOT 0.7 08/26/2023 1930   GFR 67.55 12/02/2020 0812   GFRNONAA >60 08/28/2023 0524     VAS US  ABI WITH/WO TBI Result Date: 12/01/2024  LOWER EXTREMITY DOPPLER STUDY Patient Name:  Juan Watson  Date of Exam:   11/29/2024 Medical Rec #: [id number]       Accession #:    [id number] Date of Birth: 09-22-1951       Patient Gender: M Patient Age:   58 years Exam Location:  [facility] Procedure:      VAS US  ABI WITH/WO TBI Referring Phys: [facility] --------------------------------------------------------------------------------  Indications: Peripheral artery disease. High Risk Factors: Hypertension, Diabetes, past history of smoking.  Vascular Interventions: 09/24/20: Left SFA/popliteal stent with peroneal PTA;  08/18/23: Right fem-peroneal in-situ BPG with popliteal,                         ATA & peroneal endarterectomies;. Performing Technologist: Donnice Charnley RVT  Examination Guidelines: A complete evaluation includes at minimum, Doppler waveform signals and systolic blood pressure reading at the level of bilateral brachial, anterior tibial, and posterior tibial arteries, when vessel segments are accessible. Bilateral testing is considered an integral part of a complete examination. Photoelectric Plethysmograph (PPG) waveforms and toe systolic pressure readings are included as required and additional duplex testing as needed. Limited examinations for reoccurring indications may be performed as noted.  ABI  Findings: +---------+------------------+-----+----------+--------+ Right    Rt Pressure (mmHg)IndexWaveform  Comment  +---------+------------------+-----+----------+--------+ Brachial 174                                       +---------+------------------+-----+----------+--------+ PTA      134               0.76 monophasic         +---------+------------------+-----+----------+--------+ PERO     156               0.88 biphasic           +---------+------------------+-----+----------+--------+ DP       102               0.58 monophasic         +---------+------------------+-----+----------+--------+ Great Toe101               0.57                    +---------+------------------+-----+----------+--------+ +---------+------------------+-----+----------+-------+ Left     Lt Pressure (mmHg)IndexWaveform  Comment +---------+------------------+-----+----------+-------+ Brachial 177                                      +---------+------------------+-----+----------+-------+ PTA      106               0.60 monophasic        +---------+------------------+-----+----------+-------+ DP       136               0.77 biphasic          +---------+------------------+-----+----------+-------+ Great Toe84                0.47                   +---------+------------------+-----+----------+-------+ +-------+-----------+-----------+------------+------------+ ABI/TBIToday's ABIToday's TBIPrevious ABIPrevious TBI +-------+-----------+-----------+------------+------------+ Right  0.88       0.57       0.85        0.65         +-------+-----------+-----------+------------+------------+ Left   0.77       0.47       0.95        0.63         +-------+-----------+-----------+------------+------------+  Right ABIs appear essentially unchanged compared to prior study on 08/09/2024. Left ABIs appear decreased compared to prior study on 08/09/2024. Although the  left ABI is lower than 08/09/2024, it is comparable to prior studies 04/20/2024: 0.76, 01/17/2024: 0.72  Summary: Right: Resting right ankle-brachial index indicates mild right lower extremity arterial disease. The right toe-brachial index is abnormal.  Left: Resting  left ankle-brachial index indicates moderate left lower extremity arterial disease. The left toe-brachial index is abnormal.  *See table(s) above for measurements and observations.  Electronically signed by Cordella Shawl MD on 12/01/2024 at 12:25:02 PM.    Final        Assessment & Plan:   1. Peripheral arterial disease with history of revascularization (Primary) Peripheral arterial disease of bilateral lower extremities with history of left lower extremity stent placement Chronic peripheral arterial disease with progression indicated by decreased left leg ABI from 0.95 to 0.77. Pain likely multifactorial with neurogenic and vascular components. Asymptomatic from a vascular standpoint, prefers conservative management. - Scheduled three-month follow-up to monitor symptoms and ABI trends. - Planned detailed vascular study at next visit to localize progression or new stenosis. - Provided anticipatory guidance to report new or worsening symptoms, including increased pain, rest pain, or non-healing wounds. - Discussed potential for angiography and stenting if ABI declines or symptoms worsen, with stenting preferred over bypass. - Reviewed risks of intervention, including post-procedural soreness and rare risk of foot drop. - Advised to continue ambulation as tolerated to maintain circulation. - Documented preference for conservative management and to avoid further surgery unless necessary.  2. Benign essential HTN Continue antihypertensive medications as already ordered, these medications have been reviewed and there are no changes at this time.  3. Type 2 diabetes mellitus with other circulatory complication, unspecified whether long  term insulin  use (HCC) Continue hypoglycemic medications as already ordered, these medications have been reviewed and there are no changes at this time.  Hgb A1C to be monitored as already arranged by primary service  4. Mixed hyperlipidemia Continue statin as ordered and reviewed, no changes at this time   Assessment and Plan Assessment & Plan      Medications Ordered Prior to Encounter[2]  There are no Patient Instructions on file for this visit. No follow-ups on file.   Jeren Dufrane E Syniah Berne, NP      [1]  Allergies Allergen Reactions   Bee Venom Shortness Of Breath and Swelling   Sulfa Antibiotics Swelling    Tongue swelling   Ben Gay Ultra [Menthol ] Other (See Comments)    Whelts and redness of skin  [2]  Current Outpatient Medications on File Prior to Visit  Medication Sig Dispense Refill   acetaminophen  (TYLENOL ) 500 MG tablet Take 1,000 mg by mouth every 6 (six) hours as needed for mild pain or headache.     ascorbic acid (VITAMIN C) 500 MG tablet Take 500 mg by mouth daily with supper.     aspirin  EC 81 MG tablet Take 81 mg by mouth at bedtime.     chlorthalidone  (HYGROTON ) 25 MG tablet Take 25 mg by mouth in the morning.     cholecalciferol (VITAMIN D3) 25 MCG (1000 UNIT) tablet Take 1,000 Units by mouth daily with supper.     clopidogrel  (PLAVIX ) 75 MG tablet Take 1 tablet (75 mg total) by mouth daily at 6 (six) AM. 30 tablet 11   Coenzyme Q10 (CO Q-10) 100 MG CAPS Take 200 mg by mouth daily with supper.     colchicine 0.6 MG tablet Take 0.6 mg by mouth daily as needed (gout flares).     empagliflozin (JARDIANCE) 10 MG TABS tablet Take 10 mg by mouth in the morning.     Febuxostat  80 MG TABS Take 80 mg by mouth daily with supper.     glipiZIDE (GLUCOTROL XL) 2.5 MG 24 hr tablet Take 2.5 mg by mouth.  glucose blood test strip Use as instructed 100 each 12   HYDROcodone -acetaminophen  (NORCO) 5-325 MG tablet Take 1 tablet by mouth every 6 (six) hours as needed for  moderate pain (pain score 4-6). (Patient taking differently: Take 1 tablet by mouth 2 (two) times daily as needed for moderate pain (pain score 4-6).) 30 tablet 0   levothyroxine  (SYNTHROID ) 75 MCG tablet Take 75 mcg by mouth daily before breakfast.     magnesium  oxide (MAG-OX) 400 MG tablet Take 400 mg by mouth in the morning.     metFORMIN  (GLUCOPHAGE ) 500 MG tablet Take 2 tablets (1,000 mg total) by mouth 2 (two) times daily with a meal. (Patient taking differently: Take 500 mg by mouth 2 (two) times daily with a meal.) 360 tablet 3   MOUNJARO 2.5 MG/0.5ML Pen Inject 2.5 mg into the skin.     Omega-3 Fatty Acids (FISH OIL PO) Take 2,400 mg by mouth at bedtime.     pantoprazole  (PROTONIX ) 40 MG tablet Take 40 mg by mouth in the morning.     pregabalin  (LYRICA ) 50 MG capsule Take 50 mg by mouth daily.     rosuvastatin  (CRESTOR ) 20 MG tablet Take 20 mg by mouth daily with supper.     telmisartan (MICARDIS) 80 MG tablet Take 80 mg by mouth daily with supper.     No current facility-administered medications on file prior to visit.   "

## 2025-01-04 NOTE — Progress Notes (Unsigned)
 "  Referring Physician:  Cleotilde Oneil FALCON, MD [address] [facility] [address] [facility] [place],  [place] [address]  Primary Physician:  Cleotilde Oneil FALCON, MD  History of Present Illness: 01/04/2025 Juan Watson is here today with a chief complaint of ***  Lumbar myelopathy  Back pain. Weakness of his legs.   Duration: *** Location: *** Quality: *** Severity: ***  Precipitating: aggravated by *** Modifying factors: made better by *** Weakness: none Timing: *** Bowel/Bladder Dysfunction: none  Conservative measures:  Physical therapy: *** Has not participated in? Multimodal medical therapy including regular antiinflammatories: *** Tylenol  Injections: *** epidural steroid injections?  Past Surgery: ***  JADIAN KARMAN has ***no symptoms of cervical myelopathy.  The symptoms are causing a significant impact on the patient's life.   Review of Systems:  A 10 point review of systems is negative, except for the pertinent positives and negatives detailed in the HPI.  Past Medical History: Past Medical History:  Diagnosis Date   Acquired flat foot 11/15/2015   Adhesive capsulitis of knee, right 11/01/2017   Angina pectoris 04/05/2020   Aortic stenosis 02/03/2023   a.) TTE 02/03/2023: mild AS (MPG 11; AVA = 1.41 cm2)   Atherosclerosis of native arteries of extremity with intermittent claudication 07/29/2020   Bilateral inguinal hernia    BPH (benign prostatic hyperplasia)    Bradycardia    Chewing tobacco nicotine  dependence without complication 08/10/2020   Cholecystitis 06/28/2017   Coronary artery disease    a.) LHC 08/07/1985: normal coronaries - no CAD; b.) MV 03/28/2020: ST depressions + reversible ischemic changes in the inferior wall territory - LHC recommended; c.) LHC/PCI 04/11/2020: 95% PRCA, 55% m-dRCA (3.5 x 30 mm Resolute Onyx DES), 50% dRCA, 60% pLAD, 45% p-mLAD, 85% pLCx (2.5 x 24 mm Resolute Onyx DES), 65% OM3, 65% m-dLCx, 45% p-mLCx, 85% D2    Diastolic dysfunction    a.) TTE 11/04/2016: EF >55%, mod LVH, G1DD, mild LAE, mild MAC, triv panval regurg; b.) TTE 03/28/2020: EF >55%, mod LVH, G1DD, mile LAE, mild MAC, triv panval regurg, AoV sclerosis without stenosis; c.) TTE 02/03/2023: EF >55%, mild LVH, G1DD, triv MR/TR/PR, mild AS; d.) TTE 08/23/2023: EF 60-65%, GLDD, mild MR, AoV sclerosis without stenosis   Diet-controlled type 2 diabetes mellitus (HCC)    Disease of nail 07/16/2011   Encounter for screening for malignant neoplasm of prostate 10/29/2014   Essential (primary) hypertension 10/17/2010   Familial aortic aneurysm 03/26/2014   Family history of cardiovascular disease 03/26/2014   Gastro-esophageal reflux disease without esophagitis 08/11/2013   Generalized OA 08/11/2013   Gout 03/26/2014   Heart murmur    History of 2019 novel coronavirus disease (COVID-19) 10/2019   a.) rapid Ag (+) 10/2019; b.) PCR testing (+) 01/04/2023   HLD (hyperlipidemia)    HOH (hard of hearing); wears hearing aids    Long term current use of aspirin     Motion sickness    On long term clopidogrel  therapy    OSA on CPAP    Osteoarthritis    Peripheral vascular disease    Skin lesion 03/11/2015   Status post bilateral cataract extraction     Past Surgical History: Past Surgical History:  Procedure Laterality Date   APPLICATION OF WOUND VAC Right 09/24/2023   Procedure: APPLICATION OF WOUND VAC;  Surgeon: Jama Cordella MATSU, MD;  Location: [facility];  Service: Vascular;  Laterality: Right;   BACK SURGERY  77,87   CATARACT EXTRACTION W/PHACO Right 03/23/2016  Procedure: CATARACT EXTRACTION PHACO AND INTRAOCULAR LENS PLACEMENT (IOC);  Surgeon: Steven Dingeldein, MD;  Location: [facility];  Service: Ophthalmology;  Laterality: Right;  US  01:31    CATARACT EXTRACTION W/PHACO Left 10/23/2019   Procedure: CATARACT EXTRACTION PHACO AND INTRAOCULAR LENS PLACEMENT (IOC) LEFT DIABETIC TORIC LENS 5.82,   00:44.1;  Surgeon: Myrna Adine Anes, MD;   Location: [facility];  Service: Ophthalmology;  Laterality: Left;  Diabetic - oral meds   CHOLECYSTECTOMY N/A 06/29/2017   Procedure: LAPAROSCOPIC CHOLECYSTECTOMY;  Surgeon: Desiderio Schanz, MD;  Location: [facility];  Service: General;  Laterality: N/A;   COLONOSCOPY WITH PROPOFOL  N/A 10/06/2017   Procedure: COLONOSCOPY WITH PROPOFOL ;  Surgeon: Viktoria Lamar DASEN, MD;  Location: [facility];  Service: Endoscopy;  Laterality: N/A;   CORONARY STENT INTERVENTION N/A 04/11/2020   Procedure: CORONARY STENT INTERVENTION;  Surgeon: Florencio Cara BIRCH, MD;  Location: [facility];  Service: Cardiovascular;  Laterality: N/A;   ENDARTERECTOMY FEMORAL Right 08/18/2023   Procedure: ENDARTERECTOMY FEMORAL;  Surgeon: Jama Cordella MATSU, MD;  Location: [facility];  Service: Vascular;  Laterality: Right;   ENDARTERECTOMY POPLITEAL Right 08/18/2023   Procedure: ENDARTERECTOMY POPLITEAL;  Surgeon: Jama Cordella MATSU, MD;  Location: [facility];  Service: Vascular;  Laterality: Right;   ENDARTERECTOMY TIBIOPERONEAL Right 08/18/2023   Procedure: ENDARTERECTOMY TIBIOPERONEAL;  Surgeon: Jama Cordella MATSU, MD;  Location: [facility];  Service: Vascular;  Laterality: Right;   FEMORAL-TIBIAL BYPASS GRAFT Right 08/18/2023   Procedure: BYPASS GRAFT FEMORAL-TIBIAL ARTERY (WITH VEIN);  Surgeon: Jama Cordella MATSU, MD;  Location: [facility];  Service: Vascular;  Laterality: Right;   HERNIA REPAIR  2011   umbilical x2   KNEE ARTHROSCOPY Right 12/20/2017   Procedure: ARTHROSCOPY KNEE EXTENSIVE SYNOVECTOMY AND LYSIS OF ADHESIONS;  Surgeon: Mardee Lynwood SQUIBB, MD;  Location: [facility];  Service: Orthopedics;  Laterality: Right;   KNEE DEBRIDEMENT Left    tendon   LEFT HEART CATH AND CORONARY ANGIOGRAPHY Left 08/07/1985   LEFT HEART CATH AND CORONARY ANGIOGRAPHY Left 04/11/2020   Procedure: LEFT HEART CATH AND CORONARY ANGIOGRAPHY;  Surgeon: Hester Wolm PARAS, MD;  Location: [facility];  Service: Cardiovascular;   Laterality: Left;   LOWER EXTREMITY ANGIOGRAPHY Left 09/24/2020   Procedure: LOWER EXTREMITY ANGIOGRAPHY;  Surgeon: Jama Cordella MATSU, MD;  Location: [facility];  Service: Cardiovascular;  Laterality: Left;   LOWER EXTREMITY ANGIOGRAPHY Right 07/13/2023   Procedure: Lower Extremity Angiography;  Surgeon: Jama Cordella MATSU, MD;  Location: [facility];  Service: Cardiovascular;  Laterality: Right;   TONSILLECTOMY  1972   TOTAL KNEE ARTHROPLASTY Right 04/15/2015   Procedure: TOTAL KNEE ARTHROPLASTY;  Surgeon: Dempsey Sensor, MD;  Location: [facility];  Service: Orthopedics;  Laterality: Right;   TRANSURETHRAL RESECTION OF PROSTATE  ?   WOUND DEBRIDEMENT Right 09/24/2023   Procedure: DEBRIDEMENT WOUND;  Surgeon: Jama Cordella MATSU, MD;  Location: [facility];  Service: Vascular;  Laterality: Right;    Allergies: Allergies as of 01/10/2025 - Review Complete 12/03/2024  Allergen Reaction Noted   Bee venom Shortness Of Breath and Swelling 04/05/2015   Sulfa antibiotics Swelling 04/05/2015   Ben gay ultra [menthol ] Other (See Comments) 12/13/2017    Medications: Outpatient Encounter Medications as of 01/10/2025  Medication Sig   acetaminophen  (TYLENOL ) 500 MG tablet Take 1,000 mg by mouth every 6 (six) hours as needed for mild pain or headache.   ascorbic acid (VITAMIN C) 500 MG tablet Take 500 mg by mouth daily with supper.  aspirin  EC 81 MG tablet Take 81 mg by mouth at bedtime.   chlorthalidone  (HYGROTON ) 25 MG tablet Take 25 mg by mouth in the morning.   cholecalciferol (VITAMIN D3) 25 MCG (1000 UNIT) tablet Take 1,000 Units by mouth daily with supper.   clopidogrel  (PLAVIX ) 75 MG tablet Take 1 tablet (75 mg total) by mouth daily at 6 (six) AM.   Coenzyme Q10 (CO Q-10) 100 MG CAPS Take 200 mg by mouth daily with supper.   colchicine 0.6 MG tablet Take 0.6 mg by mouth daily as needed (gout flares).   Febuxostat  80 MG TABS Take 80 mg by mouth daily with supper.   glipiZIDE  (GLUCOTROL XL) 2.5 MG 24 hr tablet Take 2.5 mg by mouth.   glucose blood test strip Use as instructed   HYDROcodone -acetaminophen  (NORCO) 5-325 MG tablet Take 1 tablet by mouth every 6 (six) hours as needed for moderate pain (pain score 4-6). (Patient taking differently: Take 1 tablet by mouth 2 (two) times daily as needed for moderate pain (pain score 4-6).)   levothyroxine  (SYNTHROID ) 75 MCG tablet Take 75 mcg by mouth daily before breakfast.   magnesium  oxide (MAG-OX) 400 MG tablet Take 400 mg by mouth in the morning.   metFORMIN  (GLUCOPHAGE ) 500 MG tablet Take 2 tablets (1,000 mg total) by mouth 2 (two) times daily with a meal. (Patient taking differently: Take 500 mg by mouth 2 (two) times daily with a meal.)   MOUNJARO 2.5 MG/0.5ML Pen Inject 2.5 mg into the skin.   Omega-3 Fatty Acids (FISH OIL PO) Take 2,400 mg by mouth at bedtime.   pantoprazole  (PROTONIX ) 40 MG tablet Take 40 mg by mouth in the morning.   pregabalin  (LYRICA ) 50 MG capsule Take 50 mg by mouth daily.   rosuvastatin  (CRESTOR ) 20 MG tablet Take 20 mg by mouth daily with supper.   telmisartan (MICARDIS) 80 MG tablet Take 80 mg by mouth daily with supper.   No facility-administered encounter medications on file as of 01/10/2025.    Social History: Social History[1]  Family Medical History: Family History  Problem Relation Age of Onset   Multiple myeloma Mother    Aneurysm Father    Aneurysm Brother    Nephrolithiasis Neg Hx    Bladder Cancer Neg Hx    Prostate cancer Neg Hx    Kidney cancer Neg Hx     Physical Examination: @VITALWITHPAIN @  General: Patient is well developed, well nourished, calm, collected, and in no apparent distress. Attention to examination is appropriate.  Psychiatric: Patient is non-anxious.  Head:  Pupils equal, round, and reactive to light.  ENT:  Oral mucosa appears well hydrated.  Neck:   Supple.  ***Full range of motion.  Respiratory: Patient is breathing without any  difficulty.  Extremities: No edema.  Vascular: Palpable dorsal pedal pulses.  Skin:   On exposed skin, there are no abnormal skin lesions.  NEUROLOGICAL:     Awake, alert, oriented to person, place, and time.  Speech is clear and fluent. Fund of knowledge is appropriate.   Cranial Nerves: Pupils equal round and reactive to light.  Facial tone is symmetric.  Facial sensation is symmetric.  ROM of spine: ***full.  Palpation of spine: ***non tender.    Strength: Side Biceps Triceps Deltoid Interossei Grip Wrist Ext. Wrist Flex.  R 5 5 5 5 5 5 5   L 5 5 5 5 5 5 5    Side Iliopsoas Quads Hamstring PF DF EHL  R 5 5  5 5 5 5   L 5 5 5 5 5 5    Reflexes are ***2+ and symmetric at the biceps, triceps, brachioradialis, patella and achilles.   Hoffman's is absent.  Clonus is not present.  Toes are down-going.  Bilateral upper and lower extremity sensation is intact to light touch.    Gait is normal.   No difficulty with tandem gait.   No evidence of dysmetria noted.  Medical Decision Making  Imaging: ***  I have personally reviewed the images and agree with the above interpretation.  Assessment and Plan: Mr. Thau is a pleasant 74 y.o. male with ***    Thank you for involving me in the care of this patient.   I spent a total of *** minutes in both face-to-face and non-face-to-face activities for this visit on the date of this encounter.   Lyle Decamp, PA-C Dept. of Neurosurgery     [1]  Social History Tobacco Use   Smoking status: Former    Current packs/day: 0.00    Average packs/day: 2.0 packs/day for 25.0 years (50.0 ttl pk-yrs)    Types: Cigarettes    Start date: 07/16/1967    Quit date: 07/15/1992    Years since quitting: 32.4   Smokeless tobacco: Current    Types: Chew  Vaping Use   Vaping status: Never Used  Substance Use Topics   Alcohol use: No   Drug use: No   "

## 2025-01-10 ENCOUNTER — Ambulatory Visit: Admitting: Physician Assistant

## 2025-01-11 ENCOUNTER — Ambulatory Visit: Admitting: Physician Assistant

## 2025-02-27 ENCOUNTER — Encounter (INDEPENDENT_AMBULATORY_CARE_PROVIDER_SITE_OTHER)

## 2025-02-27 ENCOUNTER — Ambulatory Visit (INDEPENDENT_AMBULATORY_CARE_PROVIDER_SITE_OTHER): Admitting: Nurse Practitioner
